# Patient Record
Sex: Female | Born: 1950
Health system: Southern US, Community
[De-identification: ages and names within clinical notes are randomized; demographics above are authoritative.]

## PROBLEM LIST (undated history)

## (undated) DIAGNOSIS — Z950 Presence of cardiac pacemaker: Secondary | ICD-10-CM

## (undated) DIAGNOSIS — R519 Headache, unspecified: Secondary | ICD-10-CM

## (undated) DIAGNOSIS — H409 Unspecified glaucoma: Secondary | ICD-10-CM

## (undated) DIAGNOSIS — I509 Heart failure, unspecified: Secondary | ICD-10-CM

## (undated) DIAGNOSIS — M1990 Unspecified osteoarthritis, unspecified site: Secondary | ICD-10-CM

## (undated) DIAGNOSIS — R51 Headache: Secondary | ICD-10-CM

## (undated) DIAGNOSIS — K259 Gastric ulcer, unspecified as acute or chronic, without hemorrhage or perforation: Secondary | ICD-10-CM

## (undated) DIAGNOSIS — I729 Aneurysm of unspecified site: Secondary | ICD-10-CM

## (undated) DIAGNOSIS — K5900 Constipation, unspecified: Secondary | ICD-10-CM

## (undated) DIAGNOSIS — Z992 Dependence on renal dialysis: Secondary | ICD-10-CM

## (undated) DIAGNOSIS — I2510 Atherosclerotic heart disease of native coronary artery without angina pectoris: Secondary | ICD-10-CM

## (undated) DIAGNOSIS — D649 Anemia, unspecified: Secondary | ICD-10-CM

## (undated) DIAGNOSIS — I10 Essential (primary) hypertension: Secondary | ICD-10-CM

## (undated) DIAGNOSIS — N189 Chronic kidney disease, unspecified: Secondary | ICD-10-CM

## (undated) HISTORY — PX: INSERT / REPLACE / REMOVE PACEMAKER: SUR710

## (undated) HISTORY — DX: Essential (primary) hypertension: I10

## (undated) HISTORY — DX: Aneurysm of unspecified site: I72.9

## (undated) HISTORY — DX: Gastric ulcer, unspecified as acute or chronic, without hemorrhage or perforation: K25.9

## (undated) HISTORY — PX: PACEMAKER PLACEMENT: SHX43

## (undated) HISTORY — DX: Anemia, unspecified: D64.9

## (undated) HISTORY — DX: Chronic kidney disease, unspecified: N18.9

## (undated) HISTORY — PX: ANEURYSM COILING: SHX5349

## (undated) HISTORY — PX: COLONOSCOPY: SHX174

## (undated) SURGERY — Surgical Case
Anesthesia: *Unknown

---

## 2005-12-29 ENCOUNTER — Emergency Department: Payer: Self-pay | Admitting: Emergency Medicine

## 2007-04-20 ENCOUNTER — Other Ambulatory Visit: Payer: Self-pay

## 2007-04-20 ENCOUNTER — Inpatient Hospital Stay: Payer: Self-pay | Admitting: Internal Medicine

## 2008-02-24 ENCOUNTER — Emergency Department: Payer: Self-pay | Admitting: Emergency Medicine

## 2008-03-20 ENCOUNTER — Emergency Department: Payer: Self-pay | Admitting: Internal Medicine

## 2008-05-08 ENCOUNTER — Ambulatory Visit: Payer: Self-pay | Admitting: Family Medicine

## 2008-05-28 ENCOUNTER — Ambulatory Visit: Payer: Self-pay

## 2008-08-21 ENCOUNTER — Ambulatory Visit: Payer: Self-pay | Admitting: Gastroenterology

## 2009-03-20 ENCOUNTER — Ambulatory Visit: Payer: Self-pay | Admitting: Internal Medicine

## 2009-03-25 ENCOUNTER — Inpatient Hospital Stay: Payer: Self-pay | Admitting: Internal Medicine

## 2009-07-24 ENCOUNTER — Ambulatory Visit: Payer: Self-pay | Admitting: Internal Medicine

## 2010-09-17 ENCOUNTER — Ambulatory Visit: Payer: Self-pay | Admitting: Internal Medicine

## 2011-06-02 ENCOUNTER — Ambulatory Visit: Payer: Self-pay | Admitting: Internal Medicine

## 2011-06-07 DIAGNOSIS — Q612 Polycystic kidney, adult type: Secondary | ICD-10-CM | POA: Insufficient documentation

## 2011-07-01 ENCOUNTER — Ambulatory Visit: Payer: Self-pay | Admitting: Oncology

## 2011-07-05 ENCOUNTER — Ambulatory Visit: Payer: Self-pay | Admitting: Oncology

## 2011-07-08 ENCOUNTER — Ambulatory Visit: Payer: Self-pay | Admitting: Oncology

## 2011-07-18 ENCOUNTER — Ambulatory Visit: Payer: Self-pay | Admitting: Emergency Medicine

## 2011-08-05 ENCOUNTER — Ambulatory Visit: Payer: Self-pay | Admitting: Oncology

## 2011-08-26 LAB — CBC CANCER CENTER
Bands: 1 %
Basophil %: 0.3 %
Basophil: 1 %
Eosinophil %: 1.5 %
Eosinophil: 4 %
HGB: 10.7 g/dL — ABNORMAL LOW (ref 12.0–16.0)
Lymphocyte #: 1.7 x10 3/mm (ref 1.0–3.6)
Lymphocyte %: 29.9 %
MCHC: 33.7 g/dL (ref 32.0–36.0)
MCV: 85 fL (ref 80–100)
Monocyte #: 0.4 x10 3/mm (ref 0.0–0.7)
Monocyte %: 7 %
Monocytes: 4 %
Neutrophil %: 61.3 %
RBC: 3.74 10*6/uL — ABNORMAL LOW (ref 3.80–5.20)
RDW: 20.3 % — ABNORMAL HIGH (ref 11.5–14.5)
WBC: 5.6 x10 3/mm (ref 3.6–11.0)

## 2011-08-26 LAB — IRON AND TIBC
Iron Saturation: 27 %
Unbound Iron-Bind.Cap.: 219 ug/dL

## 2011-08-26 LAB — FERRITIN: Ferritin (ARMC): 110 ng/mL (ref 8–388)

## 2011-09-02 ENCOUNTER — Ambulatory Visit: Payer: Self-pay | Admitting: Oncology

## 2011-09-21 DIAGNOSIS — R809 Proteinuria, unspecified: Secondary | ICD-10-CM | POA: Insufficient documentation

## 2011-11-25 ENCOUNTER — Ambulatory Visit: Payer: Self-pay | Admitting: Oncology

## 2011-11-25 LAB — CBC CANCER CENTER
Eosinophil #: 0.2 x10 3/mm (ref 0.0–0.7)
Eosinophil %: 2.4 %
Lymphocyte %: 33 %
MCV: 89 fL (ref 80–100)
Monocyte #: 0.6 x10 3/mm (ref 0.2–0.9)
Neutrophil %: 55.2 %
Platelet: 222 x10 3/mm (ref 150–440)
RBC: 3.51 10*6/uL — ABNORMAL LOW (ref 3.80–5.20)
RDW: 15.3 % — ABNORMAL HIGH (ref 11.5–14.5)

## 2011-11-25 LAB — IRON AND TIBC
Iron Bind.Cap.(Total): 316 ug/dL (ref 250–450)
Iron Saturation: 12 %
Unbound Iron-Bind.Cap.: 279 ug/dL

## 2011-11-25 LAB — FERRITIN: Ferritin (ARMC): 38 ng/mL (ref 8–388)

## 2011-12-03 ENCOUNTER — Ambulatory Visit: Payer: Self-pay | Admitting: Oncology

## 2011-12-28 ENCOUNTER — Ambulatory Visit: Payer: Self-pay | Admitting: Internal Medicine

## 2012-07-12 ENCOUNTER — Ambulatory Visit: Payer: Self-pay | Admitting: Internal Medicine

## 2012-07-13 ENCOUNTER — Ambulatory Visit: Payer: Self-pay | Admitting: Oncology

## 2012-07-13 LAB — CBC CANCER CENTER
Basophil #: 0.1 x10 3/mm (ref 0.0–0.1)
Basophil %: 1.1 %
HCT: 28.7 % — ABNORMAL LOW (ref 35.0–47.0)
HGB: 9.8 g/dL — ABNORMAL LOW (ref 12.0–16.0)
Lymphocyte %: 42.1 %
MCH: 28 pg (ref 26.0–34.0)
MCHC: 34.2 g/dL (ref 32.0–36.0)
MCV: 82 fL (ref 80–100)
Monocyte #: 0.5 x10 3/mm (ref 0.2–0.9)
Monocyte %: 8.6 %
Platelet: 201 x10 3/mm (ref 150–440)
RBC: 3.5 10*6/uL — ABNORMAL LOW (ref 3.80–5.20)
WBC: 6.1 x10 3/mm (ref 3.6–11.0)

## 2012-07-13 LAB — IRON AND TIBC
Iron Bind.Cap.(Total): 328 ug/dL (ref 250–450)
Iron Saturation: 15 %

## 2012-07-13 LAB — FERRITIN: Ferritin (ARMC): 24 ng/mL (ref 8–388)

## 2012-08-04 ENCOUNTER — Ambulatory Visit: Payer: Self-pay | Admitting: Oncology

## 2012-11-01 ENCOUNTER — Ambulatory Visit: Payer: Self-pay | Admitting: Oncology

## 2012-11-08 DIAGNOSIS — Z950 Presence of cardiac pacemaker: Secondary | ICD-10-CM | POA: Insufficient documentation

## 2012-11-08 DIAGNOSIS — G939 Disorder of brain, unspecified: Secondary | ICD-10-CM | POA: Insufficient documentation

## 2012-11-08 DIAGNOSIS — I726 Aneurysm of vertebral artery: Secondary | ICD-10-CM | POA: Insufficient documentation

## 2012-11-08 DIAGNOSIS — F17201 Nicotine dependence, unspecified, in remission: Secondary | ICD-10-CM | POA: Insufficient documentation

## 2012-11-08 DIAGNOSIS — I10 Essential (primary) hypertension: Secondary | ICD-10-CM | POA: Insufficient documentation

## 2012-11-08 DIAGNOSIS — Q619 Cystic kidney disease, unspecified: Secondary | ICD-10-CM | POA: Insufficient documentation

## 2012-11-13 LAB — CBC CANCER CENTER
Basophil #: 0 x10 3/mm (ref 0.0–0.1)
Eosinophil #: 0.1 x10 3/mm (ref 0.0–0.7)
HGB: 9.8 g/dL — ABNORMAL LOW (ref 12.0–16.0)
MCV: 84 fL (ref 80–100)
Neutrophil %: 55.4 %
Platelet: 194 x10 3/mm (ref 150–440)
WBC: 5.8 x10 3/mm (ref 3.6–11.0)

## 2012-11-13 LAB — BASIC METABOLIC PANEL
Anion Gap: 14 (ref 7–16)
BUN: 35 mg/dL — ABNORMAL HIGH (ref 7–18)
Calcium, Total: 8.8 mg/dL (ref 8.5–10.1)
Chloride: 108 mmol/L — ABNORMAL HIGH (ref 98–107)
Co2: 19 mmol/L — ABNORMAL LOW (ref 21–32)
EGFR (African American): 22 — ABNORMAL LOW
EGFR (Non-African Amer.): 19 — ABNORMAL LOW
Glucose: 87 mg/dL (ref 65–99)
Osmolality: 289 (ref 275–301)
Potassium: 4.9 mmol/L (ref 3.5–5.1)
Sodium: 141 mmol/L (ref 136–145)

## 2012-11-13 LAB — IRON AND TIBC
Iron: 87 ug/dL (ref 50–170)
Unbound Iron-Bind.Cap.: 234 ug/dL

## 2012-11-13 LAB — FERRITIN: Ferritin (ARMC): 36 ng/mL (ref 8–388)

## 2012-12-02 ENCOUNTER — Ambulatory Visit: Payer: Self-pay | Admitting: Oncology

## 2013-01-01 ENCOUNTER — Ambulatory Visit: Payer: Self-pay | Admitting: Oncology

## 2013-02-01 ENCOUNTER — Ambulatory Visit: Payer: Self-pay | Admitting: Oncology

## 2013-02-19 LAB — CBC CANCER CENTER
Basophil #: 0.1 x10 3/mm (ref 0.0–0.1)
Basophil %: 0.8 %
Eosinophil #: 0.2 x10 3/mm (ref 0.0–0.7)
HCT: 27.7 % — ABNORMAL LOW (ref 35.0–47.0)
HGB: 9.5 g/dL — ABNORMAL LOW (ref 12.0–16.0)
Lymphocyte %: 31.6 %
MCH: 27.8 pg (ref 26.0–34.0)
MCHC: 34.4 g/dL (ref 32.0–36.0)
MCV: 81 fL (ref 80–100)
Monocyte #: 0.4 x10 3/mm (ref 0.2–0.9)
Neutrophil #: 3.7 x10 3/mm (ref 1.4–6.5)
Neutrophil %: 58.2 %
Platelet: 256 x10 3/mm (ref 150–440)
RDW: 16.8 % — ABNORMAL HIGH (ref 11.5–14.5)

## 2013-02-19 LAB — BASIC METABOLIC PANEL
BUN: 33 mg/dL — ABNORMAL HIGH (ref 7–18)
Calcium, Total: 8.7 mg/dL (ref 8.5–10.1)
Co2: 19 mmol/L — ABNORMAL LOW (ref 21–32)
Creatinine: 2.69 mg/dL — ABNORMAL HIGH (ref 0.60–1.30)
Osmolality: 289 (ref 275–301)
Sodium: 141 mmol/L (ref 136–145)

## 2013-03-04 ENCOUNTER — Ambulatory Visit: Payer: Self-pay | Admitting: Oncology

## 2013-04-02 LAB — CANCER CENTER HEMOGLOBIN: HGB: 9.9 g/dL — ABNORMAL LOW (ref 12.0–16.0)

## 2013-04-03 ENCOUNTER — Ambulatory Visit: Payer: Self-pay | Admitting: Oncology

## 2013-05-14 ENCOUNTER — Ambulatory Visit: Payer: Self-pay | Admitting: Oncology

## 2013-05-14 LAB — BASIC METABOLIC PANEL
Calcium, Total: 8.6 mg/dL (ref 8.5–10.1)
Chloride: 111 mmol/L — ABNORMAL HIGH (ref 98–107)
Co2: 19 mmol/L — ABNORMAL LOW (ref 21–32)
EGFR (African American): 22 — ABNORMAL LOW
EGFR (Non-African Amer.): 19 — ABNORMAL LOW
Glucose: 97 mg/dL (ref 65–99)
Osmolality: 286 (ref 275–301)
Potassium: 4.6 mmol/L (ref 3.5–5.1)
Sodium: 141 mmol/L (ref 136–145)

## 2013-05-14 LAB — HEPATIC FUNCTION PANEL A (ARMC)
Alkaline Phosphatase: 44 U/L — ABNORMAL LOW (ref 50–136)
Bilirubin, Direct: <0.1 mg/dL (ref 0.00–0.20)
Bilirubin,Total: 0.3 mg/dL (ref 0.2–1.0)
SGOT(AST): 14 U/L — ABNORMAL LOW (ref 15–37)
SGPT (ALT): 13 U/L (ref 12–78)
Total Protein: 7.1 g/dL (ref 6.4–8.2)

## 2013-05-14 LAB — CBC CANCER CENTER
Basophil %: 1.2 %
Eosinophil %: 2.8 %
Lymphocyte %: 36.2 %
MCH: 26.7 pg (ref 26.0–34.0)
MCHC: 33.9 g/dL (ref 32.0–36.0)
MCV: 79 fL — ABNORMAL LOW (ref 80–100)
Monocyte #: 0.5 x10 3/mm (ref 0.2–0.9)
Neutrophil #: 3.3 x10 3/mm (ref 1.4–6.5)
Neutrophil %: 51.9 %
RBC: 3.58 10*6/uL — ABNORMAL LOW (ref 3.80–5.20)
RDW: 17 % — ABNORMAL HIGH (ref 11.5–14.5)

## 2013-06-03 ENCOUNTER — Ambulatory Visit: Payer: Self-pay | Admitting: Oncology

## 2013-07-04 ENCOUNTER — Ambulatory Visit: Payer: Self-pay | Admitting: Oncology

## 2013-08-14 ENCOUNTER — Ambulatory Visit: Payer: Self-pay | Admitting: Oncology

## 2013-08-15 LAB — CBC CANCER CENTER
Basophil #: 0.1 x10 3/mm (ref 0.0–0.1)
Basophil %: 1.1 %
EOS ABS: 0.2 x10 3/mm (ref 0.0–0.7)
Eosinophil %: 3 %
HCT: 23.9 % — ABNORMAL LOW (ref 35.0–47.0)
HGB: 7.9 g/dL — AB (ref 12.0–16.0)
LYMPHS PCT: 34.6 %
Lymphocyte #: 1.8 x10 3/mm (ref 1.0–3.6)
MCH: 25.6 pg — ABNORMAL LOW (ref 26.0–34.0)
MCHC: 32.9 g/dL (ref 32.0–36.0)
MCV: 78 fL — AB (ref 80–100)
Monocyte #: 0.3 x10 3/mm (ref 0.2–0.9)
Monocyte %: 6.8 %
Neutrophil #: 2.8 x10 3/mm (ref 1.4–6.5)
Neutrophil %: 54.5 %
PLATELETS: 219 x10 3/mm (ref 150–440)
RBC: 3.08 10*6/uL — ABNORMAL LOW (ref 3.80–5.20)
RDW: 20.6 % — ABNORMAL HIGH (ref 11.5–14.5)
WBC: 5.1 x10 3/mm (ref 3.6–11.0)

## 2013-08-15 LAB — BASIC METABOLIC PANEL
ANION GAP: 13 (ref 7–16)
BUN: 46 mg/dL — AB (ref 7–18)
CALCIUM: 8.4 mg/dL — AB (ref 8.5–10.1)
CREATININE: 3.18 mg/dL — AB (ref 0.60–1.30)
Chloride: 110 mmol/L — ABNORMAL HIGH (ref 98–107)
Co2: 19 mmol/L — ABNORMAL LOW (ref 21–32)
EGFR (African American): 17 — ABNORMAL LOW
EGFR (Non-African Amer.): 15 — ABNORMAL LOW
Glucose: 97 mg/dL (ref 65–99)
OSMOLALITY: 295 (ref 275–301)
POTASSIUM: 5.3 mmol/L — AB (ref 3.5–5.1)
Sodium: 142 mmol/L (ref 136–145)

## 2013-08-15 LAB — PHOSPHORUS: Phosphorus: 4.8 mg/dL (ref 2.5–4.9)

## 2013-08-15 LAB — IRON AND TIBC
IRON SATURATION: 13 %
IRON: 55 ug/dL (ref 50–170)
Iron Bind.Cap.(Total): 431 ug/dL (ref 250–450)
Unbound Iron-Bind.Cap.: 376 ug/dL

## 2013-08-15 LAB — FERRITIN: Ferritin (ARMC): 13 ng/mL (ref 8–388)

## 2013-09-01 ENCOUNTER — Ambulatory Visit: Payer: Self-pay | Admitting: Oncology

## 2013-09-12 LAB — CANCER CENTER HEMOGLOBIN: HGB: 8.7 g/dL — AB (ref 12.0–16.0)

## 2013-10-02 ENCOUNTER — Ambulatory Visit: Payer: Self-pay | Admitting: Oncology

## 2013-10-10 ENCOUNTER — Ambulatory Visit: Payer: Self-pay | Admitting: Internal Medicine

## 2013-10-10 LAB — CANCER CENTER HEMOGLOBIN: HGB: 8.3 g/dL — AB (ref 12.0–16.0)

## 2013-10-24 ENCOUNTER — Ambulatory Visit: Payer: Self-pay | Admitting: Internal Medicine

## 2013-11-01 ENCOUNTER — Ambulatory Visit: Payer: Self-pay | Admitting: Oncology

## 2013-11-07 LAB — CBC CANCER CENTER
Basophil #: 0 x10 3/mm (ref 0.0–0.1)
Basophil %: 0.7 %
EOS PCT: 2.2 %
Eosinophil #: 0.1 x10 3/mm (ref 0.0–0.7)
HCT: 23.5 % — AB (ref 35.0–47.0)
HGB: 7.7 g/dL — ABNORMAL LOW (ref 12.0–16.0)
LYMPHS PCT: 31.8 %
Lymphocyte #: 1.9 x10 3/mm (ref 1.0–3.6)
MCH: 25.2 pg — ABNORMAL LOW (ref 26.0–34.0)
MCHC: 32.9 g/dL (ref 32.0–36.0)
MCV: 77 fL — AB (ref 80–100)
Monocyte #: 0.6 x10 3/mm (ref 0.2–0.9)
Monocyte %: 9.4 %
NEUTROS PCT: 55.9 %
Neutrophil #: 3.4 x10 3/mm (ref 1.4–6.5)
PLATELETS: 267 x10 3/mm (ref 150–440)
RBC: 3.07 10*6/uL — ABNORMAL LOW (ref 3.80–5.20)
RDW: 19.4 % — AB (ref 11.5–14.5)
WBC: 6 x10 3/mm (ref 3.6–11.0)

## 2013-12-02 ENCOUNTER — Ambulatory Visit: Payer: Self-pay | Admitting: Oncology

## 2013-12-05 ENCOUNTER — Ambulatory Visit: Payer: Self-pay | Admitting: Nephrology

## 2013-12-05 LAB — BASIC METABOLIC PANEL
Anion Gap: 5 — ABNORMAL LOW (ref 7–16)
BUN: 41 mg/dL — AB (ref 7–18)
CALCIUM: 8.6 mg/dL (ref 8.5–10.1)
Chloride: 115 mmol/L — ABNORMAL HIGH (ref 98–107)
Co2: 22 mmol/L (ref 21–32)
Creatinine: 3.14 mg/dL — ABNORMAL HIGH (ref 0.60–1.30)
EGFR (African American): 17 — ABNORMAL LOW
EGFR (Non-African Amer.): 15 — ABNORMAL LOW
GLUCOSE: 87 mg/dL (ref 65–99)
Osmolality: 293 (ref 275–301)
POTASSIUM: 5.6 mmol/L — AB (ref 3.5–5.1)
SODIUM: 142 mmol/L (ref 136–145)

## 2013-12-05 LAB — CANCER CENTER HEMOGLOBIN: HGB: 9.3 g/dL — ABNORMAL LOW (ref 12.0–16.0)

## 2014-01-01 ENCOUNTER — Ambulatory Visit: Payer: Self-pay | Admitting: Oncology

## 2014-01-02 LAB — CANCER CENTER HEMOGLOBIN: HGB: 11.6 g/dL — AB (ref 12.0–16.0)

## 2014-01-30 ENCOUNTER — Ambulatory Visit: Payer: Self-pay | Admitting: Nephrology

## 2014-01-30 LAB — CBC CANCER CENTER
Basophil #: 0.1 x10 3/mm (ref 0.0–0.1)
Basophil %: 1.3 %
EOS ABS: 0.1 x10 3/mm (ref 0.0–0.7)
Eosinophil %: 2.1 %
HCT: 31.5 % — ABNORMAL LOW (ref 35.0–47.0)
HGB: 10.4 g/dL — ABNORMAL LOW (ref 12.0–16.0)
Lymphocyte #: 2.4 x10 3/mm (ref 1.0–3.6)
Lymphocyte %: 39 %
MCH: 28.1 pg (ref 26.0–34.0)
MCHC: 33 g/dL (ref 32.0–36.0)
MCV: 85 fL (ref 80–100)
MONOS PCT: 8.8 %
Monocyte #: 0.5 x10 3/mm (ref 0.2–0.9)
NEUTROS ABS: 2.9 x10 3/mm (ref 1.4–6.5)
Neutrophil %: 48.8 %
PLATELETS: 231 x10 3/mm (ref 150–440)
RBC: 3.7 10*6/uL — AB (ref 3.80–5.20)
RDW: 18 % — ABNORMAL HIGH (ref 11.5–14.5)
WBC: 6 x10 3/mm (ref 3.6–11.0)

## 2014-01-30 LAB — PHOSPHORUS: Phosphorus: 4.1 mg/dL (ref 2.5–4.9)

## 2014-01-30 LAB — BASIC METABOLIC PANEL
Anion Gap: 8 (ref 7–16)
BUN: 42 mg/dL — ABNORMAL HIGH (ref 7–18)
CALCIUM: 8.8 mg/dL (ref 8.5–10.1)
Chloride: 116 mmol/L — ABNORMAL HIGH (ref 98–107)
Co2: 17 mmol/L — ABNORMAL LOW (ref 21–32)
Creatinine: 3.36 mg/dL — ABNORMAL HIGH (ref 0.60–1.30)
EGFR (African American): 16 — ABNORMAL LOW
EGFR (Non-African Amer.): 14 — ABNORMAL LOW
Glucose: 93 mg/dL (ref 65–99)
Osmolality: 291 (ref 275–301)
Potassium: 5 mmol/L (ref 3.5–5.1)
Sodium: 141 mmol/L (ref 136–145)

## 2014-01-30 LAB — ALBUMIN: ALBUMIN: 3.9 g/dL (ref 3.4–5.0)

## 2014-01-30 LAB — PROTEIN / CREATININE RATIO, URINE
Creatinine, Urine: 182.1 mg/dL — ABNORMAL HIGH (ref 30.0–125.0)
PROTEIN, RANDOM URINE: 99 mg/dL — AB (ref 0–12)
Protein/Creat. Ratio: 544 mg/gCREAT — ABNORMAL HIGH (ref 0–200)

## 2014-02-01 ENCOUNTER — Ambulatory Visit: Payer: Self-pay | Admitting: Oncology

## 2014-02-27 LAB — CANCER CENTER HEMOGLOBIN: HGB: 9 g/dL — ABNORMAL LOW (ref 12.0–16.0)

## 2014-03-04 ENCOUNTER — Ambulatory Visit: Payer: Self-pay | Admitting: Oncology

## 2014-03-27 LAB — CANCER CENTER HEMOGLOBIN: HGB: 9.1 g/dL — AB (ref 12.0–16.0)

## 2014-04-03 ENCOUNTER — Ambulatory Visit: Payer: Self-pay | Admitting: Oncology

## 2014-04-24 LAB — CANCER CENTER HEMOGLOBIN: HGB: 8.8 g/dL — AB (ref 12.0–16.0)

## 2014-05-04 ENCOUNTER — Ambulatory Visit: Payer: Self-pay | Admitting: Oncology

## 2014-05-22 LAB — IRON AND TIBC
IRON SATURATION: 17 %
IRON: 71 ug/dL (ref 50–170)
Iron Bind.Cap.(Total): 413 ug/dL (ref 250–450)
Unbound Iron-Bind.Cap.: 342 ug/dL

## 2014-05-22 LAB — CBC CANCER CENTER
BASOS PCT: 0.9 %
Basophil #: 0.1 x10 3/mm (ref 0.0–0.1)
EOS ABS: 0.1 x10 3/mm (ref 0.0–0.7)
EOS PCT: 2.1 %
HCT: 28.3 % — ABNORMAL LOW (ref 35.0–47.0)
HGB: 9.2 g/dL — ABNORMAL LOW (ref 12.0–16.0)
LYMPHS PCT: 29.7 %
Lymphocyte #: 1.8 x10 3/mm (ref 1.0–3.6)
MCH: 26.6 pg (ref 26.0–34.0)
MCHC: 32.3 g/dL (ref 32.0–36.0)
MCV: 82 fL (ref 80–100)
MONO ABS: 0.4 x10 3/mm (ref 0.2–0.9)
Monocyte %: 6.3 %
NEUTROS ABS: 3.7 x10 3/mm (ref 1.4–6.5)
NEUTROS PCT: 61 %
Platelet: 236 x10 3/mm (ref 150–440)
RBC: 3.44 10*6/uL — ABNORMAL LOW (ref 3.80–5.20)
RDW: 17.4 % — AB (ref 11.5–14.5)
WBC: 6.1 x10 3/mm (ref 3.6–11.0)

## 2014-05-22 LAB — FERRITIN: FERRITIN (ARMC): 17 ng/mL (ref 8–388)

## 2014-06-03 ENCOUNTER — Ambulatory Visit: Payer: Self-pay | Admitting: Oncology

## 2014-06-19 LAB — CREATININE, SERUM
Creatinine: 3.33 mg/dL — ABNORMAL HIGH (ref 0.60–1.30)
EGFR (Non-African Amer.): 15 — ABNORMAL LOW
GFR CALC AF AMER: 18 — AB

## 2014-06-19 LAB — BUN: BUN: 50 mg/dL — AB (ref 7–18)

## 2014-06-19 LAB — CANCER CENTER HEMOGLOBIN: HGB: 8.4 g/dL — ABNORMAL LOW (ref 12.0–16.0)

## 2014-07-04 ENCOUNTER — Ambulatory Visit: Payer: Self-pay | Admitting: Oncology

## 2014-07-17 DIAGNOSIS — D631 Anemia in chronic kidney disease: Secondary | ICD-10-CM | POA: Diagnosis not present

## 2014-07-17 DIAGNOSIS — I129 Hypertensive chronic kidney disease with stage 1 through stage 4 chronic kidney disease, or unspecified chronic kidney disease: Secondary | ICD-10-CM | POA: Diagnosis not present

## 2014-07-17 DIAGNOSIS — D509 Iron deficiency anemia, unspecified: Secondary | ICD-10-CM | POA: Diagnosis not present

## 2014-07-17 DIAGNOSIS — N189 Chronic kidney disease, unspecified: Secondary | ICD-10-CM | POA: Diagnosis not present

## 2014-07-17 LAB — CANCER CENTER HEMOGLOBIN: HGB: 9.4 g/dL — ABNORMAL LOW (ref 12.0–16.0)

## 2014-08-04 ENCOUNTER — Ambulatory Visit: Payer: Self-pay | Admitting: Oncology

## 2014-08-08 DIAGNOSIS — Z950 Presence of cardiac pacemaker: Secondary | ICD-10-CM | POA: Diagnosis not present

## 2014-08-08 DIAGNOSIS — E215 Disorder of parathyroid gland, unspecified: Secondary | ICD-10-CM | POA: Diagnosis not present

## 2014-08-14 DIAGNOSIS — D509 Iron deficiency anemia, unspecified: Secondary | ICD-10-CM | POA: Diagnosis not present

## 2014-08-14 DIAGNOSIS — N189 Chronic kidney disease, unspecified: Secondary | ICD-10-CM | POA: Diagnosis not present

## 2014-08-14 DIAGNOSIS — I129 Hypertensive chronic kidney disease with stage 1 through stage 4 chronic kidney disease, or unspecified chronic kidney disease: Secondary | ICD-10-CM | POA: Diagnosis not present

## 2014-08-14 DIAGNOSIS — D631 Anemia in chronic kidney disease: Secondary | ICD-10-CM | POA: Diagnosis not present

## 2014-09-02 ENCOUNTER — Ambulatory Visit
Admit: 2014-09-02 | Disposition: A | Discharge status: Home / Self Care | Payer: Self-pay | Attending: Oncology | Admitting: Oncology

## 2014-09-11 DIAGNOSIS — D631 Anemia in chronic kidney disease: Secondary | ICD-10-CM | POA: Diagnosis not present

## 2014-09-11 DIAGNOSIS — Z8679 Personal history of other diseases of the circulatory system: Secondary | ICD-10-CM | POA: Diagnosis not present

## 2014-09-11 DIAGNOSIS — Z950 Presence of cardiac pacemaker: Secondary | ICD-10-CM | POA: Diagnosis not present

## 2014-09-11 DIAGNOSIS — Z7982 Long term (current) use of aspirin: Secondary | ICD-10-CM | POA: Diagnosis not present

## 2014-09-11 DIAGNOSIS — Z79899 Other long term (current) drug therapy: Secondary | ICD-10-CM | POA: Diagnosis not present

## 2014-09-11 DIAGNOSIS — D509 Iron deficiency anemia, unspecified: Secondary | ICD-10-CM | POA: Diagnosis not present

## 2014-09-11 DIAGNOSIS — I129 Hypertensive chronic kidney disease with stage 1 through stage 4 chronic kidney disease, or unspecified chronic kidney disease: Secondary | ICD-10-CM | POA: Diagnosis not present

## 2014-09-11 DIAGNOSIS — N189 Chronic kidney disease, unspecified: Secondary | ICD-10-CM | POA: Diagnosis not present

## 2014-09-18 DIAGNOSIS — Z79899 Other long term (current) drug therapy: Secondary | ICD-10-CM | POA: Diagnosis not present

## 2014-09-18 DIAGNOSIS — D509 Iron deficiency anemia, unspecified: Secondary | ICD-10-CM | POA: Diagnosis not present

## 2014-09-18 DIAGNOSIS — I129 Hypertensive chronic kidney disease with stage 1 through stage 4 chronic kidney disease, or unspecified chronic kidney disease: Secondary | ICD-10-CM | POA: Diagnosis not present

## 2014-09-18 DIAGNOSIS — Z7982 Long term (current) use of aspirin: Secondary | ICD-10-CM | POA: Diagnosis not present

## 2014-09-18 DIAGNOSIS — D631 Anemia in chronic kidney disease: Secondary | ICD-10-CM | POA: Diagnosis not present

## 2014-09-18 DIAGNOSIS — Z950 Presence of cardiac pacemaker: Secondary | ICD-10-CM | POA: Diagnosis not present

## 2014-09-18 DIAGNOSIS — Z8679 Personal history of other diseases of the circulatory system: Secondary | ICD-10-CM | POA: Diagnosis not present

## 2014-09-18 DIAGNOSIS — N189 Chronic kidney disease, unspecified: Secondary | ICD-10-CM | POA: Diagnosis not present

## 2014-09-25 DIAGNOSIS — I129 Hypertensive chronic kidney disease with stage 1 through stage 4 chronic kidney disease, or unspecified chronic kidney disease: Secondary | ICD-10-CM | POA: Diagnosis not present

## 2014-09-25 DIAGNOSIS — N189 Chronic kidney disease, unspecified: Secondary | ICD-10-CM | POA: Diagnosis not present

## 2014-09-25 DIAGNOSIS — Z79899 Other long term (current) drug therapy: Secondary | ICD-10-CM | POA: Diagnosis not present

## 2014-09-25 DIAGNOSIS — Z8679 Personal history of other diseases of the circulatory system: Secondary | ICD-10-CM | POA: Diagnosis not present

## 2014-09-25 DIAGNOSIS — Z950 Presence of cardiac pacemaker: Secondary | ICD-10-CM | POA: Diagnosis not present

## 2014-09-25 DIAGNOSIS — D509 Iron deficiency anemia, unspecified: Secondary | ICD-10-CM | POA: Diagnosis not present

## 2014-09-25 DIAGNOSIS — D631 Anemia in chronic kidney disease: Secondary | ICD-10-CM | POA: Diagnosis not present

## 2014-09-25 DIAGNOSIS — Z7982 Long term (current) use of aspirin: Secondary | ICD-10-CM | POA: Diagnosis not present

## 2014-10-03 ENCOUNTER — Ambulatory Visit
Admit: 2014-10-03 | Disposition: A | Discharge status: Home / Self Care | Payer: Self-pay | Attending: Oncology | Admitting: Oncology

## 2014-10-09 DIAGNOSIS — N189 Chronic kidney disease, unspecified: Secondary | ICD-10-CM | POA: Diagnosis not present

## 2014-10-09 DIAGNOSIS — I129 Hypertensive chronic kidney disease with stage 1 through stage 4 chronic kidney disease, or unspecified chronic kidney disease: Secondary | ICD-10-CM | POA: Diagnosis not present

## 2014-10-09 DIAGNOSIS — D631 Anemia in chronic kidney disease: Secondary | ICD-10-CM | POA: Diagnosis not present

## 2014-10-09 DIAGNOSIS — D509 Iron deficiency anemia, unspecified: Secondary | ICD-10-CM | POA: Diagnosis not present

## 2014-10-09 LAB — CANCER CENTER HEMOGLOBIN: HGB: 8.8 g/dL — ABNORMAL LOW (ref 12.0–16.0)

## 2014-10-27 ENCOUNTER — Ambulatory Visit
Admit: 2014-10-27 | Disposition: A | Discharge status: Home / Self Care | Payer: Self-pay | Attending: Internal Medicine | Admitting: Internal Medicine

## 2014-10-27 DIAGNOSIS — Z1231 Encounter for screening mammogram for malignant neoplasm of breast: Secondary | ICD-10-CM | POA: Diagnosis not present

## 2014-11-01 ENCOUNTER — Other Ambulatory Visit: Payer: Self-pay | Admitting: Oncology

## 2014-11-01 DIAGNOSIS — N189 Chronic kidney disease, unspecified: Principal | ICD-10-CM

## 2014-11-01 DIAGNOSIS — D631 Anemia in chronic kidney disease: Secondary | ICD-10-CM

## 2014-11-01 DIAGNOSIS — D509 Iron deficiency anemia, unspecified: Secondary | ICD-10-CM | POA: Insufficient documentation

## 2014-11-06 ENCOUNTER — Inpatient Hospital Stay: Payer: Medicare Other | Attending: Oncology

## 2014-11-06 ENCOUNTER — Inpatient Hospital Stay: Payer: Medicare Other

## 2014-11-06 VITALS — BP 143/83 | HR 73 | Temp 96.7°F | Resp 20

## 2014-11-06 DIAGNOSIS — K739 Chronic hepatitis, unspecified: Secondary | ICD-10-CM | POA: Diagnosis not present

## 2014-11-06 DIAGNOSIS — D509 Iron deficiency anemia, unspecified: Secondary | ICD-10-CM | POA: Diagnosis not present

## 2014-11-06 DIAGNOSIS — Z79899 Other long term (current) drug therapy: Secondary | ICD-10-CM | POA: Diagnosis not present

## 2014-11-06 DIAGNOSIS — D638 Anemia in other chronic diseases classified elsewhere: Secondary | ICD-10-CM | POA: Diagnosis not present

## 2014-11-06 DIAGNOSIS — D631 Anemia in chronic kidney disease: Secondary | ICD-10-CM

## 2014-11-06 DIAGNOSIS — Z950 Presence of cardiac pacemaker: Secondary | ICD-10-CM | POA: Diagnosis not present

## 2014-11-06 DIAGNOSIS — I129 Hypertensive chronic kidney disease with stage 1 through stage 4 chronic kidney disease, or unspecified chronic kidney disease: Secondary | ICD-10-CM | POA: Insufficient documentation

## 2014-11-06 DIAGNOSIS — R162 Hepatomegaly with splenomegaly, not elsewhere classified: Secondary | ICD-10-CM | POA: Diagnosis not present

## 2014-11-06 DIAGNOSIS — N189 Chronic kidney disease, unspecified: Principal | ICD-10-CM

## 2014-11-06 DIAGNOSIS — N182 Chronic kidney disease, stage 2 (mild): Secondary | ICD-10-CM | POA: Diagnosis not present

## 2014-11-06 LAB — HEMOGLOBIN: Hemoglobin: 9 g/dL — ABNORMAL LOW (ref 12.0–16.0)

## 2014-11-06 MED ORDER — EPOETIN ALFA 40000 UNIT/ML IJ SOLN
40000.0000 [IU] | Freq: Once | INTRAMUSCULAR | Status: AC
  Administered 2014-11-06: 40000 [IU] via SUBCUTANEOUS
  Filled 2014-11-06: qty 1

## 2014-12-04 ENCOUNTER — Encounter (INDEPENDENT_AMBULATORY_CARE_PROVIDER_SITE_OTHER): Payer: Self-pay

## 2014-12-04 ENCOUNTER — Inpatient Hospital Stay: Payer: Medicare Other | Attending: Oncology

## 2014-12-04 ENCOUNTER — Inpatient Hospital Stay: Payer: Medicare Other

## 2014-12-04 DIAGNOSIS — Z950 Presence of cardiac pacemaker: Secondary | ICD-10-CM | POA: Insufficient documentation

## 2014-12-04 DIAGNOSIS — D509 Iron deficiency anemia, unspecified: Secondary | ICD-10-CM | POA: Diagnosis not present

## 2014-12-04 DIAGNOSIS — Z79899 Other long term (current) drug therapy: Secondary | ICD-10-CM | POA: Diagnosis not present

## 2014-12-04 DIAGNOSIS — Z7982 Long term (current) use of aspirin: Secondary | ICD-10-CM | POA: Diagnosis not present

## 2014-12-04 DIAGNOSIS — I129 Hypertensive chronic kidney disease with stage 1 through stage 4 chronic kidney disease, or unspecified chronic kidney disease: Secondary | ICD-10-CM | POA: Insufficient documentation

## 2014-12-04 DIAGNOSIS — D631 Anemia in chronic kidney disease: Secondary | ICD-10-CM

## 2014-12-04 DIAGNOSIS — N189 Chronic kidney disease, unspecified: Secondary | ICD-10-CM | POA: Insufficient documentation

## 2014-12-04 DIAGNOSIS — Z8679 Personal history of other diseases of the circulatory system: Secondary | ICD-10-CM | POA: Insufficient documentation

## 2014-12-04 DIAGNOSIS — Z87891 Personal history of nicotine dependence: Secondary | ICD-10-CM | POA: Diagnosis not present

## 2014-12-04 LAB — HEMOGLOBIN: Hemoglobin: 11.1 g/dL — ABNORMAL LOW (ref 12.0–16.0)

## 2015-01-01 ENCOUNTER — Inpatient Hospital Stay: Payer: Medicare Other

## 2015-01-01 ENCOUNTER — Inpatient Hospital Stay (HOSPITAL_BASED_OUTPATIENT_CLINIC_OR_DEPARTMENT_OTHER): Payer: Medicare Other | Admitting: Oncology

## 2015-01-01 DIAGNOSIS — Z8679 Personal history of other diseases of the circulatory system: Secondary | ICD-10-CM | POA: Diagnosis not present

## 2015-01-01 DIAGNOSIS — D631 Anemia in chronic kidney disease: Secondary | ICD-10-CM | POA: Diagnosis not present

## 2015-01-01 DIAGNOSIS — I129 Hypertensive chronic kidney disease with stage 1 through stage 4 chronic kidney disease, or unspecified chronic kidney disease: Secondary | ICD-10-CM

## 2015-01-01 DIAGNOSIS — Z7982 Long term (current) use of aspirin: Secondary | ICD-10-CM | POA: Diagnosis not present

## 2015-01-01 DIAGNOSIS — Z87891 Personal history of nicotine dependence: Secondary | ICD-10-CM

## 2015-01-01 DIAGNOSIS — E875 Hyperkalemia: Secondary | ICD-10-CM | POA: Insufficient documentation

## 2015-01-01 DIAGNOSIS — Z79899 Other long term (current) drug therapy: Secondary | ICD-10-CM | POA: Diagnosis not present

## 2015-01-01 DIAGNOSIS — Z950 Presence of cardiac pacemaker: Secondary | ICD-10-CM | POA: Diagnosis not present

## 2015-01-01 DIAGNOSIS — E872 Acidosis, unspecified: Secondary | ICD-10-CM | POA: Insufficient documentation

## 2015-01-01 DIAGNOSIS — N189 Chronic kidney disease, unspecified: Secondary | ICD-10-CM

## 2015-01-01 DIAGNOSIS — N186 End stage renal disease: Secondary | ICD-10-CM | POA: Insufficient documentation

## 2015-01-01 DIAGNOSIS — D509 Iron deficiency anemia, unspecified: Secondary | ICD-10-CM

## 2015-01-01 LAB — HEMOGLOBIN: Hemoglobin: 9.8 g/dL — ABNORMAL LOW (ref 12.0–16.0)

## 2015-01-12 NOTE — Progress Notes (Signed)
[facility]  Telephone:(336) [phone] Fax:(336) [phone]  ID: Melissa Bradshaw OB: Oct 25, 1950  MR#: [id number]  AOZ#:[id number]  Patient Care Team: Corky Downs, MD as PCP - General (Internal Medicine)  CHIEF COMPLAINT: No chief complaint on file.   INTERVAL HISTORY: Patient returns to clinic today for repeat laboratory work and further evaluation.  She continues to feel well and is asymptomatic. She has no neurologic complaints.  She denies any recent fevers or illnesses.  She has a good appetite and has maintained her weight.  She has no chest pain or shortness of breath.  She denies any nausea, vomiting, constipation, or diarrhea.  She has no melena or hematochezia.  She has no urinary complaints.  Patient offers no specific complaints today.   REVIEW OF SYSTEMS:   Review of Systems  Constitutional: Negative.   Respiratory: Negative.   Cardiovascular: Negative.   Gastrointestinal: Negative.     As per HPI. Otherwise, a complete review of systems is negatve.  PAST MEDICAL HISTORY: Past Medical History  Diagnosis Date  . Chronic kidney disease   . Anemia   . Hypertension   . Aneurysm   . Gastric ulcer     PAST SURGICAL HISTORY: Past Surgical History  Procedure Laterality Date  . Aneurysm coiling    . Colonoscopy      2013  . Pacemaker placement      FAMILY HISTORY Family History  Problem Relation Age of Onset  . Hypertension Mother   . Diabetes Father   . Cancer Sister   . Stroke Sister   . Hypertension Sister        ADVANCED DIRECTIVES:    HEALTH MAINTENANCE: History  Substance Use Topics  . Smoking status: Former Smoker -- 0.25 packs/day for 41 years    Types: Cigarettes  . Smokeless tobacco: Never Used  . Alcohol Use: No     Colonoscopy:  PAP:  Bone density:  Lipid panel:  Allergies  Allergen Reactions  . Iodine Other (See Comments)    Unknown reaction  . Ivp Dye [Iodinated Diagnostic Agents] Rash    Current  Outpatient Prescriptions  Medication Sig Dispense Refill  . amLODipine (NORVASC) 10 MG tablet Take 10 mg by mouth daily.    Marland Kitchen aspirin 81 MG tablet Take 81 mg by mouth daily.    Marland Kitchen labetalol (NORMODYNE) 200 MG tablet Take 200 mg by mouth 2 (two) times daily.     No current facility-administered medications for this visit.    OBJECTIVE: There were no vitals filed for this visit.   There is no weight on file to calculate BMI.    ECOG FS:0 - Asymptomatic  General: Well-developed, well-nourished, no acute distress. Eyes: anicteric sclera. Lungs: Clear to auscultation bilaterally. Heart: Regular rate and rhythm. No rubs, murmurs, or gallops. Abdomen: Soft, nontender, nondistended. No organomegaly noted, normoactive bowel sounds. Musculoskeletal: No edema, cyanosis, or clubbing. Neuro: Alert, answering all questions appropriately. Cranial nerves grossly intact. Skin: No rashes or petechiae noted. Psych: Normal affect.   LAB RESULTS:  Lab Results  Component Value Date   NA 141 01/30/2014   K 5.0 01/30/2014   CL 116* 01/30/2014   CO2 17* 01/30/2014   GLUCOSE 93 01/30/2014   BUN 50* 06/19/2014   CREATININE 3.33* 06/19/2014   CALCIUM 8.8 01/30/2014   PROT 7.1 05/14/2013   ALBUMIN 3.9 01/30/2014   AST 14* 05/14/2013   ALT 13 05/14/2013   ALKPHOS 44* 05/14/2013   GFRNONAA 14* 01/30/2014   GFRAA  16* 01/30/2014    Lab Results  Component Value Date   WBC 6.1 05/22/2014   NEUTROABS 3.7 05/22/2014   HGB 9.8* 01/01/2015   HCT 28.3* 05/22/2014   MCV 82 05/22/2014   PLT 236 05/22/2014     STUDIES: No results found.  ASSESSMENT: Anemia secondary to iron deficiency and chronic renal insufficiency.  PLAN:    1.  Anemia: Patient last received IV iron in April 2016. Although patient's hemoglobin is less than 10.0, she does not wish to have 40,000 units subcutaneous Procrit today. Return to clinic every 4 weeks for laboratory work and Procrit if her hemoglobin is below 10.0.   Patient will then return to clinic in 4 months for further evaluation, will repeat iron stores at that time.  Patient expressed understanding and was in agreement with this plan. She also understands that She can call clinic at any time with any questions, concerns, or complaints.    Jeralyn Ruthsimothy J Larron Armor, MD   01/12/2015 4:03 PM

## 2015-01-29 ENCOUNTER — Inpatient Hospital Stay: Payer: Medicare Other | Attending: Oncology

## 2015-01-29 ENCOUNTER — Inpatient Hospital Stay: Payer: Medicare Other

## 2015-01-29 VITALS — BP 134/80 | HR 90 | Temp 97.6°F | Resp 18

## 2015-01-29 DIAGNOSIS — N189 Chronic kidney disease, unspecified: Secondary | ICD-10-CM | POA: Insufficient documentation

## 2015-01-29 DIAGNOSIS — D631 Anemia in chronic kidney disease: Secondary | ICD-10-CM | POA: Diagnosis not present

## 2015-01-29 DIAGNOSIS — Z79899 Other long term (current) drug therapy: Secondary | ICD-10-CM | POA: Insufficient documentation

## 2015-01-29 LAB — HEMOGLOBIN: HEMOGLOBIN: 8.2 g/dL — AB (ref 12.0–16.0)

## 2015-01-29 MED ORDER — EPOETIN ALFA 40000 UNIT/ML IJ SOLN
40000.0000 [IU] | Freq: Once | INTRAMUSCULAR | Status: AC
  Administered 2015-01-29: 40000 [IU] via SUBCUTANEOUS
  Filled 2015-01-29: qty 1

## 2015-02-05 DIAGNOSIS — R162 Hepatomegaly with splenomegaly, not elsewhere classified: Secondary | ICD-10-CM | POA: Diagnosis not present

## 2015-02-05 DIAGNOSIS — K739 Chronic hepatitis, unspecified: Secondary | ICD-10-CM | POA: Diagnosis not present

## 2015-02-05 DIAGNOSIS — N182 Chronic kidney disease, stage 2 (mild): Secondary | ICD-10-CM | POA: Diagnosis not present

## 2015-02-05 DIAGNOSIS — Z950 Presence of cardiac pacemaker: Secondary | ICD-10-CM | POA: Diagnosis not present

## 2015-02-25 ENCOUNTER — Other Ambulatory Visit: Payer: Self-pay | Admitting: *Deleted

## 2015-02-25 DIAGNOSIS — N189 Chronic kidney disease, unspecified: Principal | ICD-10-CM

## 2015-02-25 DIAGNOSIS — D631 Anemia in chronic kidney disease: Secondary | ICD-10-CM

## 2015-02-26 ENCOUNTER — Inpatient Hospital Stay: Payer: Medicare Other | Attending: Oncology

## 2015-02-26 ENCOUNTER — Inpatient Hospital Stay: Payer: Medicare Other

## 2015-02-26 VITALS — BP 157/82 | HR 92 | Temp 97.0°F | Resp 18

## 2015-02-26 DIAGNOSIS — D631 Anemia in chronic kidney disease: Secondary | ICD-10-CM | POA: Diagnosis not present

## 2015-02-26 DIAGNOSIS — N189 Chronic kidney disease, unspecified: Secondary | ICD-10-CM | POA: Diagnosis not present

## 2015-02-26 DIAGNOSIS — D509 Iron deficiency anemia, unspecified: Secondary | ICD-10-CM | POA: Diagnosis not present

## 2015-02-26 DIAGNOSIS — I129 Hypertensive chronic kidney disease with stage 1 through stage 4 chronic kidney disease, or unspecified chronic kidney disease: Secondary | ICD-10-CM | POA: Insufficient documentation

## 2015-02-26 LAB — HEMOGLOBIN: Hemoglobin: 9.7 g/dL — ABNORMAL LOW (ref 12.0–16.0)

## 2015-02-26 MED ORDER — EPOETIN ALFA 40000 UNIT/ML IJ SOLN
40000.0000 [IU] | Freq: Once | INTRAMUSCULAR | Status: AC
  Administered 2015-02-26: 40000 [IU] via SUBCUTANEOUS
  Filled 2015-02-26: qty 1

## 2015-03-25 ENCOUNTER — Other Ambulatory Visit: Payer: Self-pay | Admitting: *Deleted

## 2015-03-25 DIAGNOSIS — N189 Chronic kidney disease, unspecified: Principal | ICD-10-CM

## 2015-03-25 DIAGNOSIS — D631 Anemia in chronic kidney disease: Secondary | ICD-10-CM

## 2015-03-26 ENCOUNTER — Inpatient Hospital Stay: Payer: Medicare Other | Attending: Oncology

## 2015-03-26 ENCOUNTER — Inpatient Hospital Stay: Payer: Medicare Other

## 2015-03-26 VITALS — BP 145/75 | HR 88 | Resp 20

## 2015-03-26 DIAGNOSIS — D509 Iron deficiency anemia, unspecified: Secondary | ICD-10-CM | POA: Diagnosis not present

## 2015-03-26 DIAGNOSIS — D631 Anemia in chronic kidney disease: Secondary | ICD-10-CM

## 2015-03-26 DIAGNOSIS — I129 Hypertensive chronic kidney disease with stage 1 through stage 4 chronic kidney disease, or unspecified chronic kidney disease: Secondary | ICD-10-CM | POA: Insufficient documentation

## 2015-03-26 DIAGNOSIS — N189 Chronic kidney disease, unspecified: Principal | ICD-10-CM

## 2015-03-26 LAB — HEMOGLOBIN: Hemoglobin: 9.1 g/dL — ABNORMAL LOW (ref 12.0–16.0)

## 2015-03-26 MED ORDER — EPOETIN ALFA 40000 UNIT/ML IJ SOLN
40000.0000 [IU] | Freq: Once | INTRAMUSCULAR | Status: AC
  Administered 2015-03-26: 40000 [IU] via SUBCUTANEOUS
  Filled 2015-03-26: qty 1

## 2015-04-17 ENCOUNTER — Other Ambulatory Visit: Payer: Self-pay | Admitting: *Deleted

## 2015-04-17 DIAGNOSIS — D631 Anemia in chronic kidney disease: Secondary | ICD-10-CM

## 2015-04-17 DIAGNOSIS — N189 Chronic kidney disease, unspecified: Principal | ICD-10-CM

## 2015-04-23 ENCOUNTER — Inpatient Hospital Stay: Payer: Medicare Other

## 2015-04-23 ENCOUNTER — Inpatient Hospital Stay (HOSPITAL_BASED_OUTPATIENT_CLINIC_OR_DEPARTMENT_OTHER): Payer: Medicare Other | Admitting: Oncology

## 2015-04-23 ENCOUNTER — Inpatient Hospital Stay: Payer: Medicare Other | Attending: Oncology

## 2015-04-23 VITALS — BP 146/87 | HR 83 | Temp 97.3°F | Resp 16 | Wt 116.2 lb

## 2015-04-23 DIAGNOSIS — D509 Iron deficiency anemia, unspecified: Secondary | ICD-10-CM

## 2015-04-23 DIAGNOSIS — N189 Chronic kidney disease, unspecified: Secondary | ICD-10-CM

## 2015-04-23 DIAGNOSIS — Z7982 Long term (current) use of aspirin: Secondary | ICD-10-CM | POA: Insufficient documentation

## 2015-04-23 DIAGNOSIS — Z79899 Other long term (current) drug therapy: Secondary | ICD-10-CM | POA: Insufficient documentation

## 2015-04-23 DIAGNOSIS — D631 Anemia in chronic kidney disease: Secondary | ICD-10-CM

## 2015-04-23 DIAGNOSIS — Z950 Presence of cardiac pacemaker: Secondary | ICD-10-CM

## 2015-04-23 DIAGNOSIS — I129 Hypertensive chronic kidney disease with stage 1 through stage 4 chronic kidney disease, or unspecified chronic kidney disease: Secondary | ICD-10-CM

## 2015-04-23 DIAGNOSIS — Z87891 Personal history of nicotine dependence: Secondary | ICD-10-CM | POA: Diagnosis not present

## 2015-04-23 LAB — HEMOGLOBIN: Hemoglobin: 10.1 g/dL — ABNORMAL LOW (ref 12.0–16.0)

## 2015-04-23 NOTE — Progress Notes (Signed)
Patient offers no problems or concerns today. 

## 2015-05-07 DIAGNOSIS — N182 Chronic kidney disease, stage 2 (mild): Secondary | ICD-10-CM | POA: Diagnosis not present

## 2015-05-07 DIAGNOSIS — K739 Chronic hepatitis, unspecified: Secondary | ICD-10-CM | POA: Diagnosis not present

## 2015-05-07 DIAGNOSIS — Z950 Presence of cardiac pacemaker: Secondary | ICD-10-CM | POA: Diagnosis not present

## 2015-05-07 DIAGNOSIS — R162 Hepatomegaly with splenomegaly, not elsewhere classified: Secondary | ICD-10-CM | POA: Diagnosis not present

## 2015-05-07 NOTE — Progress Notes (Signed)
[facility]  Telephone:(336) [phone] Fax:([phone]  ID: [name] OB: [birth year]  MR#: [id number]  AOZ#:[id number]  Patient Care Team: Corky Downs, MD as PCP - General (Internal Medicine)  CHIEF COMPLAINT:  Chief Complaint  Patient presents with  . Anemia    INTERVAL HISTORY: Patient returns to clinic today for repeat laboratory work, further evaluation, and continuation of Procrit.  She continues to feel well and is asymptomatic. She has no neurologic complaints.  She denies any recent fevers or illnesses.  She has a good appetite and denies weight loss. She has no chest pain or shortness of breath.  She denies any nausea, vomiting, constipation, or diarrhea.  She has no melena or hematochezia.  She has no urinary complaints.  Patient offers no specific complaints today.   REVIEW OF SYSTEMS:   Review of Systems  Constitutional: Negative.  Negative for malaise/fatigue.  Respiratory: Negative.   Cardiovascular: Negative.   Gastrointestinal: Negative.   Musculoskeletal: Negative.   Neurological: Negative.  Negative for weakness.    As per HPI. Otherwise, a complete review of systems is negatve.  PAST MEDICAL HISTORY: Past Medical History  Diagnosis Date  . Chronic kidney disease   . Anemia   . Hypertension   . Aneurysm   . Gastric ulcer     PAST SURGICAL HISTORY: Past Surgical History  Procedure Laterality Date  . Aneurysm coiling    . Colonoscopy      2013  . Pacemaker placement      FAMILY HISTORY Family History  Problem Relation Age of Onset  . Hypertension Mother   . Diabetes Father   . Cancer Sister   . Stroke Sister   . Hypertension Sister        ADVANCED DIRECTIVES:    HEALTH MAINTENANCE: Social History  Substance Use Topics  . Smoking status: Former Smoker -- 0.25 packs/day for 41 years    Types: Cigarettes  . Smokeless tobacco: Never Used  . Alcohol Use: No     Colonoscopy:  PAP:  Bone density:  Lipid  panel:  Allergies  Allergen Reactions  . Iodine Other (See Comments)    Unknown reaction  . Ivp Dye [Iodinated Diagnostic Agents] Rash    Current Outpatient Prescriptions  Medication Sig Dispense Refill  . amLODipine (NORVASC) 10 MG tablet Take 10 mg by mouth daily.    Marland Kitchen aspirin 81 MG tablet Take 81 mg by mouth daily.    Marland Kitchen labetalol (NORMODYNE) 200 MG tablet Take 200 mg by mouth 2 (two) times daily.     No current facility-administered medications for this visit.    OBJECTIVE: Filed Vitals:   04/23/15 1512  BP: 146/87  Pulse: 83  Temp: 97.3 F (36.3 C)  Resp: 16     Body mass index is 19.03 kg/(m^2).    ECOG FS:0 - Asymptomatic  General: Well-developed, well-nourished, no acute distress. Eyes: anicteric sclera. Lungs: Clear to auscultation bilaterally. Heart: Regular rate and rhythm. No rubs, murmurs, or gallops. Abdomen: Soft, nontender, nondistended. No organomegaly noted, normoactive bowel sounds. Musculoskeletal: No edema, cyanosis, or clubbing. Neuro: Alert, answering all questions appropriately. Cranial nerves grossly intact. Skin: No rashes or petechiae noted. Psych: Normal affect.   LAB RESULTS:  Lab Results  Component Value Date   NA 141 01/30/2014   K 5.0 01/30/2014   CL 116* 01/30/2014   CO2 17* 01/30/2014   GLUCOSE 93 01/30/2014   BUN 50* 06/19/2014   CREATININE 3.33* 06/19/2014   CALCIUM 8.8  01/30/2014   PROT 7.1 05/14/2013   ALBUMIN 3.9 01/30/2014   AST 14* 05/14/2013   ALT 13 05/14/2013   ALKPHOS 44* 05/14/2013   BILITOT 0.3 05/14/2013   GFRNONAA 15* 06/19/2014   GFRAA 18* 06/19/2014    Lab Results  Component Value Date   WBC 6.1 05/22/2014   NEUTROABS 3.7 05/22/2014   HGB 10.1* 04/23/2015   HCT 28.3* 05/22/2014   MCV 82 05/22/2014   PLT 236 05/22/2014     STUDIES: No results found.  ASSESSMENT: Anemia secondary to iron deficiency and chronic renal insufficiency.  PLAN:    1.  Anemia: Patient last received IV iron in April  2016. Although patient's hemoglobin is 10.1, therefore she does not require 40,000 units subcutaneous Procrit today. Return to clinic every 4 weeks for laboratory work and Procrit if her hemoglobin is below 10.0.  Patient will then return to clinic in 4 months for further evaluation.  Patient expressed understanding and was in agreement with this plan. She also understands that She can call clinic at any time with any questions, concerns, or complaints.    Jeralyn Ruthsimothy J Marilena Trevathan, MD   05/07/2015 4:24 PM

## 2015-05-08 DIAGNOSIS — H40003 Preglaucoma, unspecified, bilateral: Secondary | ICD-10-CM | POA: Diagnosis not present

## 2015-05-21 ENCOUNTER — Inpatient Hospital Stay: Payer: Medicare Other | Attending: Oncology

## 2015-05-21 ENCOUNTER — Inpatient Hospital Stay: Payer: Medicare Other

## 2015-05-21 VITALS — BP 137/77 | HR 86 | Resp 20

## 2015-05-21 DIAGNOSIS — D631 Anemia in chronic kidney disease: Secondary | ICD-10-CM

## 2015-05-21 DIAGNOSIS — D509 Iron deficiency anemia, unspecified: Secondary | ICD-10-CM | POA: Diagnosis not present

## 2015-05-21 DIAGNOSIS — N189 Chronic kidney disease, unspecified: Principal | ICD-10-CM

## 2015-05-21 LAB — HEMOGLOBIN: Hemoglobin: 8.1 g/dL — ABNORMAL LOW (ref 12.0–16.0)

## 2015-05-21 MED ORDER — EPOETIN ALFA 40000 UNIT/ML IJ SOLN
40000.0000 [IU] | Freq: Once | INTRAMUSCULAR | Status: AC
  Administered 2015-05-21: 40000 [IU] via SUBCUTANEOUS
  Filled 2015-05-21: qty 1

## 2015-06-05 DIAGNOSIS — E784 Other hyperlipidemia: Secondary | ICD-10-CM | POA: Diagnosis not present

## 2015-06-05 DIAGNOSIS — R5381 Other malaise: Secondary | ICD-10-CM | POA: Diagnosis not present

## 2015-06-05 DIAGNOSIS — Z23 Encounter for immunization: Secondary | ICD-10-CM | POA: Diagnosis not present

## 2015-06-05 DIAGNOSIS — Z0000 Encounter for general adult medical examination without abnormal findings: Secondary | ICD-10-CM | POA: Diagnosis not present

## 2015-06-05 DIAGNOSIS — I10 Essential (primary) hypertension: Secondary | ICD-10-CM | POA: Diagnosis not present

## 2015-06-08 ENCOUNTER — Other Ambulatory Visit: Payer: Self-pay | Admitting: Internal Medicine

## 2015-06-08 DIAGNOSIS — R162 Hepatomegaly with splenomegaly, not elsewhere classified: Secondary | ICD-10-CM

## 2015-06-08 DIAGNOSIS — R634 Abnormal weight loss: Secondary | ICD-10-CM

## 2015-06-12 ENCOUNTER — Ambulatory Visit: Payer: Medicare Other

## 2015-06-18 ENCOUNTER — Inpatient Hospital Stay: Payer: Medicare Other | Attending: Oncology

## 2015-06-18 ENCOUNTER — Inpatient Hospital Stay: Payer: Medicare Other

## 2015-06-18 VITALS — BP 136/76 | HR 82

## 2015-06-18 DIAGNOSIS — D509 Iron deficiency anemia, unspecified: Secondary | ICD-10-CM | POA: Insufficient documentation

## 2015-06-18 DIAGNOSIS — N189 Chronic kidney disease, unspecified: Principal | ICD-10-CM

## 2015-06-18 DIAGNOSIS — D631 Anemia in chronic kidney disease: Secondary | ICD-10-CM

## 2015-06-18 DIAGNOSIS — I129 Hypertensive chronic kidney disease with stage 1 through stage 4 chronic kidney disease, or unspecified chronic kidney disease: Secondary | ICD-10-CM | POA: Insufficient documentation

## 2015-06-18 LAB — HEMOGLOBIN: Hemoglobin: 8.2 g/dL — ABNORMAL LOW (ref 12.0–16.0)

## 2015-06-18 MED ORDER — EPOETIN ALFA 40000 UNIT/ML IJ SOLN
40000.0000 [IU] | Freq: Once | INTRAMUSCULAR | Status: AC
  Administered 2015-06-18: 40000 [IU] via SUBCUTANEOUS
  Filled 2015-06-18: qty 1

## 2015-06-19 ENCOUNTER — Ambulatory Visit
Admission: RE | Admit: 2015-06-19 | Discharge: 2015-06-19 | Disposition: A | Discharge status: Home / Self Care | Payer: Medicare Other | Source: Ambulatory Visit | Attending: Internal Medicine | Admitting: Internal Medicine

## 2015-06-19 DIAGNOSIS — R634 Abnormal weight loss: Secondary | ICD-10-CM | POA: Insufficient documentation

## 2015-06-19 DIAGNOSIS — K7689 Other specified diseases of liver: Secondary | ICD-10-CM | POA: Insufficient documentation

## 2015-06-19 DIAGNOSIS — N281 Cyst of kidney, acquired: Secondary | ICD-10-CM | POA: Insufficient documentation

## 2015-06-19 DIAGNOSIS — R188 Other ascites: Secondary | ICD-10-CM | POA: Diagnosis not present

## 2015-06-19 DIAGNOSIS — R162 Hepatomegaly with splenomegaly, not elsewhere classified: Secondary | ICD-10-CM

## 2015-06-19 DIAGNOSIS — I700 Atherosclerosis of aorta: Secondary | ICD-10-CM | POA: Diagnosis not present

## 2015-06-23 DIAGNOSIS — R634 Abnormal weight loss: Secondary | ICD-10-CM | POA: Diagnosis not present

## 2015-06-23 DIAGNOSIS — N183 Chronic kidney disease, stage 3 (moderate): Secondary | ICD-10-CM | POA: Diagnosis not present

## 2015-06-23 DIAGNOSIS — D638 Anemia in other chronic diseases classified elsewhere: Secondary | ICD-10-CM | POA: Diagnosis not present

## 2015-06-23 DIAGNOSIS — N182 Chronic kidney disease, stage 2 (mild): Secondary | ICD-10-CM | POA: Diagnosis not present

## 2015-07-16 ENCOUNTER — Inpatient Hospital Stay: Payer: Medicare Other

## 2015-07-16 ENCOUNTER — Inpatient Hospital Stay: Payer: Medicare Other | Attending: Oncology

## 2015-07-16 VITALS — BP 146/74 | HR 86

## 2015-07-16 DIAGNOSIS — D631 Anemia in chronic kidney disease: Secondary | ICD-10-CM

## 2015-07-16 DIAGNOSIS — D509 Iron deficiency anemia, unspecified: Secondary | ICD-10-CM | POA: Insufficient documentation

## 2015-07-16 DIAGNOSIS — N189 Chronic kidney disease, unspecified: Secondary | ICD-10-CM

## 2015-07-16 LAB — HEMOGLOBIN: HEMOGLOBIN: 7.8 g/dL — AB (ref 12.0–16.0)

## 2015-07-16 MED ORDER — EPOETIN ALFA 40000 UNIT/ML IJ SOLN
40000.0000 [IU] | Freq: Once | INTRAMUSCULAR | Status: AC
  Administered 2015-07-16: 40000 [IU] via SUBCUTANEOUS
  Filled 2015-07-16: qty 1

## 2015-08-07 DIAGNOSIS — R162 Hepatomegaly with splenomegaly, not elsewhere classified: Secondary | ICD-10-CM | POA: Diagnosis not present

## 2015-08-07 DIAGNOSIS — Z950 Presence of cardiac pacemaker: Secondary | ICD-10-CM | POA: Diagnosis not present

## 2015-08-07 DIAGNOSIS — H40003 Preglaucoma, unspecified, bilateral: Secondary | ICD-10-CM | POA: Diagnosis not present

## 2015-08-07 DIAGNOSIS — N182 Chronic kidney disease, stage 2 (mild): Secondary | ICD-10-CM | POA: Diagnosis not present

## 2015-08-13 ENCOUNTER — Inpatient Hospital Stay: Payer: Medicare Other

## 2015-08-13 ENCOUNTER — Inpatient Hospital Stay (HOSPITAL_BASED_OUTPATIENT_CLINIC_OR_DEPARTMENT_OTHER): Payer: Medicare Other | Admitting: Oncology

## 2015-08-13 ENCOUNTER — Inpatient Hospital Stay: Payer: Medicare Other | Attending: Oncology

## 2015-08-13 VITALS — BP 122/74 | HR 83 | Temp 97.6°F | Resp 16 | Wt 126.1 lb

## 2015-08-13 DIAGNOSIS — Z79899 Other long term (current) drug therapy: Secondary | ICD-10-CM | POA: Insufficient documentation

## 2015-08-13 DIAGNOSIS — N189 Chronic kidney disease, unspecified: Secondary | ICD-10-CM | POA: Insufficient documentation

## 2015-08-13 DIAGNOSIS — D631 Anemia in chronic kidney disease: Secondary | ICD-10-CM | POA: Insufficient documentation

## 2015-08-13 DIAGNOSIS — R5383 Other fatigue: Secondary | ICD-10-CM | POA: Diagnosis not present

## 2015-08-13 DIAGNOSIS — R5381 Other malaise: Secondary | ICD-10-CM

## 2015-08-13 DIAGNOSIS — D509 Iron deficiency anemia, unspecified: Secondary | ICD-10-CM

## 2015-08-13 DIAGNOSIS — Z7982 Long term (current) use of aspirin: Secondary | ICD-10-CM | POA: Insufficient documentation

## 2015-08-13 DIAGNOSIS — I129 Hypertensive chronic kidney disease with stage 1 through stage 4 chronic kidney disease, or unspecified chronic kidney disease: Secondary | ICD-10-CM

## 2015-08-13 DIAGNOSIS — R531 Weakness: Secondary | ICD-10-CM | POA: Diagnosis not present

## 2015-08-13 DIAGNOSIS — Z950 Presence of cardiac pacemaker: Secondary | ICD-10-CM | POA: Insufficient documentation

## 2015-08-13 DIAGNOSIS — Z87891 Personal history of nicotine dependence: Secondary | ICD-10-CM | POA: Diagnosis not present

## 2015-08-13 LAB — HEMOGLOBIN: HEMOGLOBIN: 7.9 g/dL — AB (ref 12.0–16.0)

## 2015-08-13 MED ORDER — EPOETIN ALFA 40000 UNIT/ML IJ SOLN
40000.0000 [IU] | Freq: Once | INTRAMUSCULAR | Status: AC
  Administered 2015-08-13: 40000 [IU] via SUBCUTANEOUS
  Filled 2015-08-13: qty 1

## 2015-08-13 NOTE — Progress Notes (Signed)
Patient is feeling more fatigued. 

## 2015-08-13 NOTE — Progress Notes (Signed)
[facility]  Telephone:(336) [phone] Fax:(336) [phone]  ID: Melissa Bradshaw OB: Aug 15, 1950  MR#: [id number]  AOZ#:[id number]  Patient Care Team: Corky Downs, MD as PCP - General (Internal Medicine)  CHIEF COMPLAINT:  Chief Complaint  Patient presents with  . Anemia    INTERVAL HISTORY: Patient returns to clinic today for repeat laboratory work, further evaluation, and continuation of Procrit.  She continues to feel well other than fatigue. She has no neurologic complaints.  She denies any recent fevers or illnesses.  She has a good appetite and denies weight loss. She has no chest pain or shortness of breath.  She denies any nausea, vomiting, constipation, or diarrhea.  She has no melena or hematochezia.  She has no urinary complaints.  Patient offers no further specific complaints today.   REVIEW OF SYSTEMS:   Review of Systems  Constitutional: Positive for malaise/fatigue.  Respiratory: Negative.   Cardiovascular: Negative.   Gastrointestinal: Negative.   Musculoskeletal: Negative.   Neurological: Positive for weakness.    As per HPI. Otherwise, a complete review of systems is negatve.  PAST MEDICAL HISTORY: Past Medical History  Diagnosis Date  . Chronic kidney disease   . Anemia   . Hypertension   . Aneurysm   . Gastric ulcer     PAST SURGICAL HISTORY: Past Surgical History  Procedure Laterality Date  . Aneurysm coiling    . Colonoscopy      2013  . Pacemaker placement      FAMILY HISTORY Family History  Problem Relation Age of Onset  . Hypertension Mother   . Diabetes Father   . Cancer Sister   . Stroke Sister   . Hypertension Sister        ADVANCED DIRECTIVES:    HEALTH MAINTENANCE: Social History  Substance Use Topics  . Smoking status: Former Smoker -- 0.25 packs/day for 41 years    Types: Cigarettes  . Smokeless tobacco: Never Used  . Alcohol Use: No     Colonoscopy:  PAP:  Bone density:  Lipid  panel:  Allergies  Allergen Reactions  . Iodine Other (See Comments)    Unknown reaction  . Ivp Dye [Iodinated Diagnostic Agents] Rash    Current Outpatient Prescriptions  Medication Sig Dispense Refill  . amLODipine (NORVASC) 10 MG tablet Take 10 mg by mouth daily.    Marland Kitchen aspirin 81 MG tablet Take 81 mg by mouth daily.    Marland Kitchen labetalol (NORMODYNE) 200 MG tablet Take 200 mg by mouth 2 (two) times daily.     No current facility-administered medications for this visit.    OBJECTIVE: Filed Vitals:   08/13/15 1400  BP: 122/74  Pulse: 83  Temp: 97.6 F (36.4 C)  Resp: 16     Body mass index is 20.66 kg/(m^2).    ECOG FS:0 - Asymptomatic  General: Well-developed, well-nourished, no acute distress. Eyes: anicteric sclera. Lungs: Clear to auscultation bilaterally. Heart: Regular rate and rhythm. No rubs, murmurs, or gallops. Abdomen: Soft, nontender, nondistended. No organomegaly noted, normoactive bowel sounds. Musculoskeletal: No edema, cyanosis, or clubbing. Neuro: Alert, answering all questions appropriately. Cranial nerves grossly intact. Skin: No rashes or petechiae noted. Psych: Normal affect.   LAB RESULTS:  Lab Results  Component Value Date   NA 141 01/30/2014   K 5.0 01/30/2014   CL 116* 01/30/2014   CO2 17* 01/30/2014   GLUCOSE 93 01/30/2014   BUN 50* 06/19/2014   CREATININE 3.33* 06/19/2014   CALCIUM 8.8 01/30/2014  PROT 7.1 05/14/2013   ALBUMIN 3.9 01/30/2014   AST 14* 05/14/2013   ALT 13 05/14/2013   ALKPHOS 44* 05/14/2013   BILITOT 0.3 05/14/2013   GFRNONAA 15* 06/19/2014   GFRAA 18* 06/19/2014    Lab Results  Component Value Date   WBC 6.1 05/22/2014   NEUTROABS 3.7 05/22/2014   HGB 7.9* 08/13/2015   HCT 28.3* 05/22/2014   MCV 82 05/22/2014   PLT 236 05/22/2014     STUDIES: No results found.  ASSESSMENT: Anemia secondary to iron deficiency and chronic renal insufficiency.  PLAN:    1.  Anemia: Patient last received IV iron in April  2016. Patient's hemoglobin is 7.9, therefore she will require 40,000 units subcutaneous Procrit today. Return to clinic every 4 weeks for laboratory work and Procrit if her hemoglobin is below 10.0. She will have iron studies and ferritin drawn with next blood draw in one month.  Patient will then return to clinic in 4 months for further evaluation.  Patient expressed understanding and was in agreement with this plan. She also understands that She can call clinic at any time with any questions, concerns, or complaints.    Genevie Cheshire, NP   08/13/2015 2:37 PM   Patient was seen and evaluated independently and I agree with the assessment and plan as dictated above.  Jeralyn Ruths, MD 08/14/2015 1:25 PM

## 2015-09-07 DIAGNOSIS — H401130 Primary open-angle glaucoma, bilateral, stage unspecified: Secondary | ICD-10-CM | POA: Diagnosis not present

## 2015-09-10 ENCOUNTER — Inpatient Hospital Stay: Payer: Medicare Other

## 2015-09-10 ENCOUNTER — Inpatient Hospital Stay: Payer: Medicare Other | Attending: Oncology

## 2015-09-10 VITALS — BP 150/80 | HR 85 | Temp 97.0°F | Resp 20

## 2015-09-10 DIAGNOSIS — I129 Hypertensive chronic kidney disease with stage 1 through stage 4 chronic kidney disease, or unspecified chronic kidney disease: Secondary | ICD-10-CM | POA: Insufficient documentation

## 2015-09-10 DIAGNOSIS — D631 Anemia in chronic kidney disease: Secondary | ICD-10-CM

## 2015-09-10 DIAGNOSIS — N189 Chronic kidney disease, unspecified: Secondary | ICD-10-CM | POA: Insufficient documentation

## 2015-09-10 DIAGNOSIS — D509 Iron deficiency anemia, unspecified: Secondary | ICD-10-CM | POA: Insufficient documentation

## 2015-09-10 LAB — CBC WITH DIFFERENTIAL/PLATELET
BASOS ABS: 0.1 10*3/uL (ref 0–0.1)
Basophils Relative: 2 %
EOS ABS: 0.1 10*3/uL (ref 0–0.7)
EOS PCT: 2 %
HCT: 22.5 % — ABNORMAL LOW (ref 35.0–47.0)
Hemoglobin: 7.6 g/dL — ABNORMAL LOW (ref 12.0–16.0)
LYMPHS PCT: 36 %
Lymphs Abs: 1.8 10*3/uL (ref 1.0–3.6)
MCH: 27 pg (ref 26.0–34.0)
MCHC: 33.8 g/dL (ref 32.0–36.0)
MCV: 79.7 fL — AB (ref 80.0–100.0)
MONO ABS: 0.3 10*3/uL (ref 0.2–0.9)
Monocytes Relative: 6 %
Neutro Abs: 2.7 10*3/uL (ref 1.4–6.5)
Neutrophils Relative %: 54 %
PLATELETS: 212 10*3/uL (ref 150–440)
RBC: 2.82 MIL/uL — AB (ref 3.80–5.20)
RDW: 18.3 % — AB (ref 11.5–14.5)
WBC: 5.1 10*3/uL (ref 3.6–11.0)

## 2015-09-10 LAB — FERRITIN: FERRITIN: 21 ng/mL (ref 11–307)

## 2015-09-10 LAB — IRON AND TIBC
Iron: 50 ug/dL (ref 28–170)
Saturation Ratios: 15 % (ref 10.4–31.8)
TIBC: 335 ug/dL (ref 250–450)
UIBC: 285 ug/dL

## 2015-09-10 MED ORDER — EPOETIN ALFA 40000 UNIT/ML IJ SOLN
40000.0000 [IU] | Freq: Once | INTRAMUSCULAR | Status: AC
  Administered 2015-09-10: 40000 [IU] via SUBCUTANEOUS
  Filled 2015-09-10: qty 1

## 2015-10-09 DIAGNOSIS — H401131 Primary open-angle glaucoma, bilateral, mild stage: Secondary | ICD-10-CM | POA: Diagnosis not present

## 2015-10-15 ENCOUNTER — Inpatient Hospital Stay: Payer: Medicare Other

## 2015-10-15 ENCOUNTER — Inpatient Hospital Stay: Payer: Medicare Other | Attending: Oncology

## 2015-10-15 VITALS — BP 135/75 | HR 92 | Resp 20

## 2015-10-15 DIAGNOSIS — D631 Anemia in chronic kidney disease: Secondary | ICD-10-CM

## 2015-10-15 DIAGNOSIS — N189 Chronic kidney disease, unspecified: Secondary | ICD-10-CM | POA: Diagnosis not present

## 2015-10-15 DIAGNOSIS — D509 Iron deficiency anemia, unspecified: Secondary | ICD-10-CM | POA: Insufficient documentation

## 2015-10-15 DIAGNOSIS — I129 Hypertensive chronic kidney disease with stage 1 through stage 4 chronic kidney disease, or unspecified chronic kidney disease: Secondary | ICD-10-CM | POA: Diagnosis not present

## 2015-10-15 LAB — HEMOGLOBIN: Hemoglobin: 7.2 g/dL — ABNORMAL LOW (ref 12.0–16.0)

## 2015-10-15 MED ORDER — EPOETIN ALFA 40000 UNIT/ML IJ SOLN
40000.0000 [IU] | Freq: Once | INTRAMUSCULAR | Status: AC
  Administered 2015-10-15: 40000 [IU] via SUBCUTANEOUS
  Filled 2015-10-15: qty 1

## 2015-11-05 DIAGNOSIS — D638 Anemia in other chronic diseases classified elsewhere: Secondary | ICD-10-CM | POA: Diagnosis not present

## 2015-11-05 DIAGNOSIS — R162 Hepatomegaly with splenomegaly, not elsewhere classified: Secondary | ICD-10-CM | POA: Diagnosis not present

## 2015-11-05 DIAGNOSIS — Z950 Presence of cardiac pacemaker: Secondary | ICD-10-CM | POA: Diagnosis not present

## 2015-11-05 DIAGNOSIS — N182 Chronic kidney disease, stage 2 (mild): Secondary | ICD-10-CM | POA: Diagnosis not present

## 2015-11-12 ENCOUNTER — Inpatient Hospital Stay: Payer: Medicare Other | Attending: Oncology

## 2015-11-12 ENCOUNTER — Inpatient Hospital Stay: Payer: Medicare Other

## 2015-11-12 VITALS — BP 136/73 | HR 88 | Temp 95.6°F | Resp 18

## 2015-11-12 DIAGNOSIS — N189 Chronic kidney disease, unspecified: Secondary | ICD-10-CM | POA: Insufficient documentation

## 2015-11-12 DIAGNOSIS — D509 Iron deficiency anemia, unspecified: Secondary | ICD-10-CM | POA: Diagnosis not present

## 2015-11-12 DIAGNOSIS — I129 Hypertensive chronic kidney disease with stage 1 through stage 4 chronic kidney disease, or unspecified chronic kidney disease: Secondary | ICD-10-CM | POA: Diagnosis not present

## 2015-11-12 DIAGNOSIS — D631 Anemia in chronic kidney disease: Secondary | ICD-10-CM

## 2015-11-12 LAB — HEMOGLOBIN: Hemoglobin: 7.7 g/dL — ABNORMAL LOW (ref 12.0–16.0)

## 2015-11-12 MED ORDER — EPOETIN ALFA 40000 UNIT/ML IJ SOLN
40000.0000 [IU] | Freq: Once | INTRAMUSCULAR | Status: AC
  Administered 2015-11-12: 40000 [IU] via SUBCUTANEOUS
  Filled 2015-11-12: qty 1

## 2015-12-24 DIAGNOSIS — I10 Essential (primary) hypertension: Secondary | ICD-10-CM | POA: Diagnosis not present

## 2015-12-24 DIAGNOSIS — E872 Acidosis: Secondary | ICD-10-CM | POA: Diagnosis not present

## 2015-12-24 DIAGNOSIS — N185 Chronic kidney disease, stage 5: Secondary | ICD-10-CM | POA: Diagnosis not present

## 2015-12-24 DIAGNOSIS — E875 Hyperkalemia: Secondary | ICD-10-CM | POA: Diagnosis not present

## 2016-01-11 ENCOUNTER — Inpatient Hospital Stay: Payer: Medicare Other

## 2016-01-11 ENCOUNTER — Inpatient Hospital Stay: Payer: Medicare Other | Admitting: Oncology

## 2016-01-12 DIAGNOSIS — N185 Chronic kidney disease, stage 5: Secondary | ICD-10-CM | POA: Diagnosis not present

## 2016-01-18 DIAGNOSIS — N2581 Secondary hyperparathyroidism of renal origin: Secondary | ICD-10-CM | POA: Diagnosis not present

## 2016-01-18 DIAGNOSIS — I10 Essential (primary) hypertension: Secondary | ICD-10-CM | POA: Diagnosis not present

## 2016-01-18 DIAGNOSIS — N185 Chronic kidney disease, stage 5: Secondary | ICD-10-CM | POA: Diagnosis not present

## 2016-01-18 DIAGNOSIS — R809 Proteinuria, unspecified: Secondary | ICD-10-CM | POA: Diagnosis not present

## 2016-01-18 DIAGNOSIS — E872 Acidosis: Secondary | ICD-10-CM | POA: Diagnosis not present

## 2016-01-31 DIAGNOSIS — N189 Chronic kidney disease, unspecified: Secondary | ICD-10-CM

## 2016-01-31 DIAGNOSIS — D631 Anemia in chronic kidney disease: Secondary | ICD-10-CM | POA: Insufficient documentation

## 2016-01-31 NOTE — Progress Notes (Deleted)
[facility]  Telephone:(336) [phone] Fax:(336) [phone]  ID: Melissa Bradshaw OB: Nov 18, 1950  MR#: [id number]  AOZ#:[id number]  Patient Care Team: Corky Downs, Melissa Bradshaw as PCP - General (Internal Medicine)  CHIEF COMPLAINT: Anemia secondary to iron deficiency and chronic renal insufficiency.  INTERVAL HISTORY: Patient returns to clinic today for repeat laboratory work, further evaluation, and continuation of Procrit.  She continues to feel well other than fatigue. She has no neurologic complaints.  She denies any recent fevers or illnesses.  She has a good appetite and denies weight loss. She has no chest pain or shortness of breath.  She denies any nausea, vomiting, constipation, or diarrhea.  She has no melena or hematochezia.  She has no urinary complaints.  Patient offers no further specific complaints today.   REVIEW OF SYSTEMS:   Review of Systems  Constitutional: Positive for malaise/fatigue.  Respiratory: Negative.   Cardiovascular: Negative.   Gastrointestinal: Negative.   Musculoskeletal: Negative.   Neurological: Positive for weakness.    As per HPI. Otherwise, a complete review of systems is negatve.  PAST MEDICAL HISTORY: Past Medical History:  Diagnosis Date  . Anemia   . Aneurysm   . Chronic kidney disease   . Gastric ulcer   . Hypertension     PAST SURGICAL HISTORY: Past Surgical History:  Procedure Laterality Date  . ANEURYSM COILING    . COLONOSCOPY     2013  . PACEMAKER PLACEMENT      FAMILY HISTORY Family History  Problem Relation Age of Onset  . Hypertension Mother   . Diabetes Father   . Cancer Sister   . Stroke Sister   . Hypertension Sister        ADVANCED DIRECTIVES:    HEALTH MAINTENANCE: Social History  Substance Use Topics  . Smoking status: Former Smoker    Packs/day: 0.25    Years: 41.00    Types: Cigarettes  . Smokeless tobacco: Never Used  . Alcohol use No     Colonoscopy:  PAP:  Bone density:  Lipid  panel:  Allergies  Allergen Reactions  . Iodine Other (See Comments)    Unknown reaction  . Ivp Dye [Iodinated Diagnostic Agents] Rash    Current Outpatient Prescriptions  Medication Sig Dispense Refill  . amLODipine (NORVASC) 10 MG tablet Take 10 mg by mouth daily.    Marland Kitchen aspirin 81 MG tablet Take 81 mg by mouth daily.    Marland Kitchen labetalol (NORMODYNE) 200 MG tablet Take 200 mg by mouth 2 (two) times daily.     No current facility-administered medications for this visit.     OBJECTIVE: There were no vitals filed for this visit.   There is no height or weight on file to calculate BMI.    ECOG FS:0 - Asymptomatic  General: Well-developed, well-nourished, no acute distress. Eyes: anicteric sclera. Lungs: Clear to auscultation bilaterally. Heart: Regular rate and rhythm. No rubs, murmurs, or gallops. Abdomen: Soft, nontender, nondistended. No organomegaly noted, normoactive bowel sounds. Musculoskeletal: No edema, cyanosis, or clubbing. Neuro: Alert, answering all questions appropriately. Cranial nerves grossly intact. Skin: No rashes or petechiae noted. Psych: Normal affect.   LAB RESULTS:  Lab Results  Component Value Date   NA 141 01/30/2014   K 5.0 01/30/2014   CL 116 (H) 01/30/2014   CO2 17 (L) 01/30/2014   GLUCOSE 93 01/30/2014   BUN 50 (H) 06/19/2014   CREATININE 3.33 (H) 06/19/2014   CALCIUM 8.8 01/30/2014   PROT 7.1 05/14/2013  ALBUMIN 3.9 01/30/2014   AST 14 (L) 05/14/2013   ALT 13 05/14/2013   ALKPHOS 44 (L) 05/14/2013   BILITOT 0.3 05/14/2013   GFRNONAA 15 (L) 06/19/2014   GFRAA 18 (L) 06/19/2014    Lab Results  Component Value Date   WBC 5.1 09/10/2015   NEUTROABS 2.7 09/10/2015   HGB 7.7 (L) 11/12/2015   HCT 22.5 (L) 09/10/2015   MCV 79.7 (L) 09/10/2015   PLT 212 09/10/2015     STUDIES: No results found.  ASSESSMENT: Anemia secondary to iron deficiency and chronic renal insufficiency.  PLAN:    1.  Anemia secondary to iron deficiency and  chronic renal insufficiency: Patient last received IV iron in April 2016. Patient's hemoglobin is 7.9, therefore she will require 40,000 units subcutaneous Procrit today. Return to clinic every 4 weeks for laboratory work and Procrit if her hemoglobin is below 10.0. She will have iron studies and ferritin drawn with next blood draw in one month.  Patient will then return to clinic in 4 months for further evaluation.  Patient expressed understanding and was in agreement with this plan. She also understands that She can call clinic at any time with any questions, concerns, or complaints.    Jeralyn Ruths, Melissa Bradshaw   01/31/2016 3:15 PM

## 2016-02-01 ENCOUNTER — Inpatient Hospital Stay: Payer: Medicare Other

## 2016-02-01 ENCOUNTER — Inpatient Hospital Stay: Payer: Medicare Other | Attending: Oncology

## 2016-02-01 ENCOUNTER — Inpatient Hospital Stay: Payer: Medicare Other | Admitting: Oncology

## 2016-02-04 DIAGNOSIS — Z950 Presence of cardiac pacemaker: Secondary | ICD-10-CM | POA: Diagnosis not present

## 2016-02-04 DIAGNOSIS — N182 Chronic kidney disease, stage 2 (mild): Secondary | ICD-10-CM | POA: Diagnosis not present

## 2016-02-04 DIAGNOSIS — N183 Chronic kidney disease, stage 3 (moderate): Secondary | ICD-10-CM | POA: Diagnosis not present

## 2016-02-04 DIAGNOSIS — R162 Hepatomegaly with splenomegaly, not elsewhere classified: Secondary | ICD-10-CM | POA: Diagnosis not present

## 2016-02-23 NOTE — Progress Notes (Deleted)
[facility]  [phone] [phone]) [phone]  ID: Tristan Schroederaroline J Anwar OB: 10/10/1950  MR#: [id number]  AOZ#:[id number]#:[id number]  Patient Care Team: Corky DownsJaved Masoud, MD as PCP - General (Internal Medicine)  CHIEF COMPLAINT: Anemia secondary to chronic renal insufficiency.  INTERVAL HISTORY: Patient returns to clinic today for repeat laboratory work, further evaluation, and continuation of Procrit.  She continues to feel well other than fatigue. She has no neurologic complaints.  She denies any recent fevers or illnesses.  She has a good appetite and denies weight loss. She has no chest pain or shortness of breath.  She denies any nausea, vomiting, constipation, or diarrhea.  She has no melena or hematochezia.  She has no urinary complaints.  Patient offers no further specific complaints today.   REVIEW OF SYSTEMS:   Review of Systems  Constitutional: Positive for malaise/fatigue.  Respiratory: Negative.   Cardiovascular: Negative.   Gastrointestinal: Negative.   Musculoskeletal: Negative.   Neurological: Positive for weakness.    As per HPI. Otherwise, a complete review of systems is negatve.  PAST MEDICAL HISTORY: Past Medical History:  Diagnosis Date  . Anemia   . Aneurysm   . Chronic kidney disease   . Gastric ulcer   . Hypertension     PAST SURGICAL HISTORY: Past Surgical History:  Procedure Laterality Date  . ANEURYSM COILING    . COLONOSCOPY     2013  . PACEMAKER PLACEMENT      FAMILY HISTORY Family History  Problem Relation Age of Onset  . Hypertension Mother   . Diabetes Father   . Cancer Sister   . Stroke Sister   . Hypertension Sister        ADVANCED DIRECTIVES:    HEALTH MAINTENANCE: Social History  Substance Use Topics  . Smoking status: Former Smoker    Packs/day: 0.25    Years: 41.00    Types: Cigarettes  . Smokeless tobacco: Never Used  . Alcohol use No     Colonoscopy:  PAP:  Bone density:  Lipid panel:  Allergies   Allergen Reactions  . Iodine Other (See Comments)    Unknown reaction  . Ivp Dye [Iodinated Diagnostic Agents] Rash    Current Outpatient Prescriptions  Medication Sig Dispense Refill  . amLODipine (NORVASC) 10 MG tablet Take 10 mg by mouth daily.    Marland Kitchen. aspirin 81 MG tablet Take 81 mg by mouth daily.    Marland Kitchen. labetalol (NORMODYNE) 200 MG tablet Take 200 mg by mouth 2 (two) times daily.     No current facility-administered medications for this visit.     OBJECTIVE: There were no vitals filed for this visit.   There is no height or weight on file to calculate BMI.    ECOG FS:0 - Asymptomatic  General: Well-developed, well-nourished, no acute distress. Eyes: anicteric sclera. Lungs: Clear to auscultation bilaterally. Heart: Regular rate and rhythm. No rubs, murmurs, or gallops. Abdomen: Soft, nontender, nondistended. No organomegaly noted, normoactive bowel sounds. Musculoskeletal: No edema, cyanosis, or clubbing. Neuro: Alert, answering all questions appropriately. Cranial nerves grossly intact. Skin: No rashes or petechiae noted. Psych: Normal affect.   LAB RESULTS:  Lab Results  Component Value Date   NA 141 01/30/2014   K 5.0 01/30/2014   CL 116 (H) 01/30/2014   CO2 17 (L) 01/30/2014   GLUCOSE 93 01/30/2014   BUN 50 (H) 06/19/2014   CREATININE 3.33 (H) 06/19/2014   CALCIUM 8.8 01/30/2014   PROT 7.1 05/14/2013   ALBUMIN 3.9 01/30/2014  AST 14 (L) 05/14/2013   ALT 13 05/14/2013   ALKPHOS 44 (L) 05/14/2013   BILITOT 0.3 05/14/2013   GFRNONAA 15 (L) 06/19/2014   GFRAA 18 (L) 06/19/2014    Lab Results  Component Value Date   WBC 5.1 09/10/2015   NEUTROABS 2.7 09/10/2015   HGB 7.7 (L) 11/12/2015   HCT 22.5 (L) 09/10/2015   MCV 79.7 (L) 09/10/2015   PLT 212 09/10/2015     STUDIES: No results found.  ASSESSMENT: Anemia secondary to chronic renal insufficiency.  PLAN:    1.  Anemia secondary to chronic renal insufficiency: Patient last received IV iron in  April 2016. Patient's hemoglobin is 7.9, therefore she will require 40,000 units subcutaneous Procrit today. Return to clinic every 4 weeks for laboratory work and Procrit if her hemoglobin is below 10.0. She will have iron studies and ferritin drawn with next blood draw in one month.  Patient will then return to clinic in 4 months for further evaluation.  Patient expressed understanding and was in agreement with this plan. She also understands that She can call clinic at any time with any questions, concerns, or complaints.    Jeralyn Ruthsimothy J Brittne Kawasaki, MD   02/23/2016 11:55 PM

## 2016-02-24 ENCOUNTER — Inpatient Hospital Stay: Payer: Medicare Other

## 2016-02-24 ENCOUNTER — Inpatient Hospital Stay: Payer: Medicare Other | Admitting: Oncology

## 2016-02-25 ENCOUNTER — Encounter: Payer: Self-pay | Admitting: *Deleted

## 2016-03-01 ENCOUNTER — Telehealth: Payer: Self-pay | Admitting: *Deleted

## 2016-03-01 NOTE — Telephone Encounter (Signed)
Pt called to inform our office that she received her certified letter and that she no longer needs procrit injections. States she feels better with out them.

## 2016-05-05 DIAGNOSIS — R162 Hepatomegaly with splenomegaly, not elsewhere classified: Secondary | ICD-10-CM | POA: Diagnosis not present

## 2016-05-05 DIAGNOSIS — N182 Chronic kidney disease, stage 2 (mild): Secondary | ICD-10-CM | POA: Diagnosis not present

## 2016-05-05 DIAGNOSIS — Z950 Presence of cardiac pacemaker: Secondary | ICD-10-CM | POA: Diagnosis not present

## 2016-05-05 DIAGNOSIS — D638 Anemia in other chronic diseases classified elsewhere: Secondary | ICD-10-CM | POA: Diagnosis not present

## 2016-05-06 ENCOUNTER — Other Ambulatory Visit: Payer: Self-pay | Admitting: Internal Medicine

## 2016-05-06 DIAGNOSIS — R1900 Intra-abdominal and pelvic swelling, mass and lump, unspecified site: Secondary | ICD-10-CM

## 2016-05-09 ENCOUNTER — Ambulatory Visit
Admission: RE | Admit: 2016-05-09 | Discharge: 2016-05-09 | Disposition: A | Discharge status: Home / Self Care | Payer: Medicare Other | Source: Ambulatory Visit | Attending: Internal Medicine | Admitting: Internal Medicine

## 2016-05-09 ENCOUNTER — Ambulatory Visit: Payer: Medicare Other

## 2016-05-09 DIAGNOSIS — R1900 Intra-abdominal and pelvic swelling, mass and lump, unspecified site: Secondary | ICD-10-CM | POA: Diagnosis present

## 2016-05-09 DIAGNOSIS — K7689 Other specified diseases of liver: Secondary | ICD-10-CM | POA: Diagnosis not present

## 2016-05-09 DIAGNOSIS — R188 Other ascites: Secondary | ICD-10-CM | POA: Insufficient documentation

## 2016-05-17 DIAGNOSIS — N182 Chronic kidney disease, stage 2 (mild): Secondary | ICD-10-CM | POA: Diagnosis not present

## 2016-05-17 DIAGNOSIS — I10 Essential (primary) hypertension: Secondary | ICD-10-CM | POA: Diagnosis not present

## 2016-05-17 DIAGNOSIS — N183 Chronic kidney disease, stage 3 (moderate): Secondary | ICD-10-CM | POA: Diagnosis not present

## 2016-05-17 DIAGNOSIS — K739 Chronic hepatitis, unspecified: Secondary | ICD-10-CM | POA: Diagnosis not present

## 2016-05-31 ENCOUNTER — Inpatient Hospital Stay
Admission: EM | Admit: 2016-05-31 | Discharge: 2016-06-07 | DRG: 286 | Disposition: A | Discharge status: Home / Self Care | Payer: Medicare Other | Attending: Internal Medicine | Admitting: Internal Medicine

## 2016-05-31 ENCOUNTER — Encounter: Payer: Self-pay | Admitting: Emergency Medicine

## 2016-05-31 DIAGNOSIS — R188 Other ascites: Secondary | ICD-10-CM | POA: Diagnosis present

## 2016-05-31 DIAGNOSIS — I132 Hypertensive heart and chronic kidney disease with heart failure and with stage 5 chronic kidney disease, or end stage renal disease: Principal | ICD-10-CM | POA: Diagnosis present

## 2016-05-31 DIAGNOSIS — Z8711 Personal history of peptic ulcer disease: Secondary | ICD-10-CM

## 2016-05-31 DIAGNOSIS — Q613 Polycystic kidney, unspecified: Secondary | ICD-10-CM

## 2016-05-31 DIAGNOSIS — I509 Heart failure, unspecified: Secondary | ICD-10-CM | POA: Diagnosis not present

## 2016-05-31 DIAGNOSIS — N186 End stage renal disease: Secondary | ICD-10-CM | POA: Diagnosis not present

## 2016-05-31 DIAGNOSIS — I5023 Acute on chronic systolic (congestive) heart failure: Secondary | ICD-10-CM | POA: Diagnosis not present

## 2016-05-31 DIAGNOSIS — Z7982 Long term (current) use of aspirin: Secondary | ICD-10-CM | POA: Diagnosis not present

## 2016-05-31 DIAGNOSIS — Z87891 Personal history of nicotine dependence: Secondary | ICD-10-CM | POA: Diagnosis not present

## 2016-05-31 DIAGNOSIS — R601 Generalized edema: Secondary | ICD-10-CM

## 2016-05-31 DIAGNOSIS — N189 Chronic kidney disease, unspecified: Secondary | ICD-10-CM | POA: Diagnosis not present

## 2016-05-31 DIAGNOSIS — Q446 Cystic disease of liver: Secondary | ICD-10-CM | POA: Diagnosis not present

## 2016-05-31 DIAGNOSIS — D649 Anemia, unspecified: Secondary | ICD-10-CM | POA: Diagnosis not present

## 2016-05-31 DIAGNOSIS — R262 Difficulty in walking, not elsewhere classified: Secondary | ICD-10-CM

## 2016-05-31 DIAGNOSIS — I2510 Atherosclerotic heart disease of native coronary artery without angina pectoris: Secondary | ICD-10-CM | POA: Diagnosis present

## 2016-05-31 DIAGNOSIS — I313 Pericardial effusion (noninflammatory): Secondary | ICD-10-CM | POA: Diagnosis not present

## 2016-05-31 DIAGNOSIS — I10 Essential (primary) hypertension: Secondary | ICD-10-CM | POA: Diagnosis not present

## 2016-05-31 DIAGNOSIS — Z23 Encounter for immunization: Secondary | ICD-10-CM

## 2016-05-31 DIAGNOSIS — Z8673 Personal history of transient ischemic attack (TIA), and cerebral infarction without residual deficits: Secondary | ICD-10-CM

## 2016-05-31 DIAGNOSIS — N2581 Secondary hyperparathyroidism of renal origin: Secondary | ICD-10-CM | POA: Diagnosis present

## 2016-05-31 DIAGNOSIS — D631 Anemia in chronic kidney disease: Secondary | ICD-10-CM | POA: Diagnosis present

## 2016-05-31 DIAGNOSIS — R627 Adult failure to thrive: Secondary | ICD-10-CM | POA: Diagnosis present

## 2016-05-31 DIAGNOSIS — N179 Acute kidney failure, unspecified: Secondary | ICD-10-CM | POA: Diagnosis not present

## 2016-05-31 DIAGNOSIS — R0602 Shortness of breath: Secondary | ICD-10-CM | POA: Diagnosis not present

## 2016-05-31 DIAGNOSIS — E8770 Fluid overload, unspecified: Secondary | ICD-10-CM | POA: Diagnosis not present

## 2016-05-31 DIAGNOSIS — K739 Chronic hepatitis, unspecified: Secondary | ICD-10-CM | POA: Diagnosis not present

## 2016-05-31 DIAGNOSIS — E872 Acidosis: Secondary | ICD-10-CM | POA: Diagnosis not present

## 2016-05-31 DIAGNOSIS — E43 Unspecified severe protein-calorie malnutrition: Secondary | ICD-10-CM | POA: Diagnosis present

## 2016-05-31 DIAGNOSIS — M6281 Muscle weakness (generalized): Secondary | ICD-10-CM

## 2016-05-31 DIAGNOSIS — I5021 Acute systolic (congestive) heart failure: Secondary | ICD-10-CM | POA: Diagnosis not present

## 2016-05-31 DIAGNOSIS — Z992 Dependence on renal dialysis: Secondary | ICD-10-CM | POA: Diagnosis not present

## 2016-05-31 DIAGNOSIS — D638 Anemia in other chronic diseases classified elsewhere: Secondary | ICD-10-CM | POA: Diagnosis not present

## 2016-05-31 DIAGNOSIS — Z681 Body mass index (BMI) 19 or less, adult: Secondary | ICD-10-CM | POA: Diagnosis not present

## 2016-05-31 DIAGNOSIS — M7989 Other specified soft tissue disorders: Secondary | ICD-10-CM | POA: Diagnosis not present

## 2016-05-31 DIAGNOSIS — I739 Peripheral vascular disease, unspecified: Secondary | ICD-10-CM | POA: Diagnosis not present

## 2016-05-31 DIAGNOSIS — R600 Localized edema: Secondary | ICD-10-CM | POA: Diagnosis not present

## 2016-05-31 DIAGNOSIS — Z950 Presence of cardiac pacemaker: Secondary | ICD-10-CM | POA: Diagnosis not present

## 2016-05-31 DIAGNOSIS — R162 Hepatomegaly with splenomegaly, not elsewhere classified: Secondary | ICD-10-CM | POA: Diagnosis not present

## 2016-05-31 DIAGNOSIS — I120 Hypertensive chronic kidney disease with stage 5 chronic kidney disease or end stage renal disease: Secondary | ICD-10-CM | POA: Diagnosis not present

## 2016-05-31 DIAGNOSIS — Z79899 Other long term (current) drug therapy: Secondary | ICD-10-CM

## 2016-05-31 HISTORY — DX: Atherosclerotic heart disease of native coronary artery without angina pectoris: I25.10

## 2016-05-31 HISTORY — DX: Presence of cardiac pacemaker: Z95.0

## 2016-05-31 HISTORY — DX: Heart failure, unspecified: I50.9

## 2016-05-31 LAB — URINALYSIS COMPLETE WITH MICROSCOPIC (ARMC ONLY)
BILIRUBIN URINE: NEGATIVE
GLUCOSE, UA: NEGATIVE mg/dL
Ketones, ur: NEGATIVE mg/dL
LEUKOCYTES UA: NEGATIVE
NITRITE: NEGATIVE
Protein, ur: 100 mg/dL — AB
SPECIFIC GRAVITY, URINE: 1.01 (ref 1.005–1.030)
pH: 5 (ref 5.0–8.0)

## 2016-05-31 LAB — CBC
HCT: 23.3 % — ABNORMAL LOW (ref 35.0–47.0)
Hemoglobin: 7.9 g/dL — ABNORMAL LOW (ref 12.0–16.0)
MCH: 27 pg (ref 26.0–34.0)
MCHC: 33.8 g/dL (ref 32.0–36.0)
MCV: 80.1 fL (ref 80.0–100.0)
PLATELETS: 410 10*3/uL (ref 150–440)
RBC: 2.9 MIL/uL — ABNORMAL LOW (ref 3.80–5.20)
RDW: 20.5 % — AB (ref 11.5–14.5)
WBC: 5.4 10*3/uL (ref 3.6–11.0)

## 2016-05-31 LAB — BASIC METABOLIC PANEL
Anion gap: 13 (ref 5–15)
BUN: 48 mg/dL — AB (ref 6–20)
CALCIUM: 8.6 mg/dL — AB (ref 8.9–10.3)
CO2: 12 mmol/L — ABNORMAL LOW (ref 22–32)
CREATININE: 5.99 mg/dL — AB (ref 0.44–1.00)
Chloride: 111 mmol/L (ref 101–111)
GFR calc Af Amer: 8 mL/min — ABNORMAL LOW (ref >60)
GFR, EST NON AFRICAN AMERICAN: 7 mL/min — AB (ref >60)
Glucose, Bld: 101 mg/dL — ABNORMAL HIGH (ref 65–99)
POTASSIUM: 4.4 mmol/L (ref 3.5–5.1)
SODIUM: 136 mmol/L (ref 135–145)

## 2016-05-31 LAB — BRAIN NATRIURETIC PEPTIDE: B Natriuretic Peptide: 279 pg/mL — ABNORMAL HIGH (ref 0.0–100.0)

## 2016-05-31 MED ORDER — PNEUMOCOCCAL VAC POLYVALENT 25 MCG/0.5ML IJ INJ
0.5000 mL | INJECTION | INTRAMUSCULAR | Status: AC
  Administered 2016-06-01: 0.5 mL via INTRAMUSCULAR
  Filled 2016-05-31: qty 0.5

## 2016-05-31 MED ORDER — ASPIRIN EC 81 MG PO TBEC
81.0000 mg | DELAYED_RELEASE_TABLET | Freq: Every day | ORAL | Status: DC
  Administered 2016-06-01 – 2016-06-07 (×7): 81 mg via ORAL
  Filled 2016-05-31 (×7): qty 1

## 2016-05-31 MED ORDER — LATANOPROST 0.005 % OP SOLN
1.0000 [drp] | Freq: Every day | OPHTHALMIC | Status: DC
  Administered 2016-05-31 – 2016-06-06 (×7): 1 [drp] via OPHTHALMIC
  Filled 2016-05-31: qty 2.5

## 2016-05-31 MED ORDER — HYDRALAZINE HCL 25 MG PO TABS
25.0000 mg | ORAL_TABLET | Freq: Three times a day (TID) | ORAL | Status: DC
  Administered 2016-05-31 – 2016-06-07 (×15): 25 mg via ORAL
  Filled 2016-05-31 (×17): qty 1

## 2016-05-31 MED ORDER — FUROSEMIDE 10 MG/ML IJ SOLN
5.0000 mg/h | INTRAVENOUS | Status: DC
  Administered 2016-05-31 – 2016-06-04 (×3): 5 mg/h via INTRAVENOUS
  Filled 2016-05-31 (×3): qty 25

## 2016-05-31 MED ORDER — HEPARIN SODIUM (PORCINE) 5000 UNIT/ML IJ SOLN
5000.0000 [IU] | Freq: Three times a day (TID) | INTRAMUSCULAR | Status: DC
  Administered 2016-05-31 – 2016-06-07 (×18): 5000 [IU] via SUBCUTANEOUS
  Filled 2016-05-31 (×20): qty 1

## 2016-05-31 MED ORDER — INFLUENZA VAC SPLIT QUAD 0.5 ML IM SUSY
0.5000 mL | PREFILLED_SYRINGE | INTRAMUSCULAR | Status: AC
  Administered 2016-06-01: 0.5 mL via INTRAMUSCULAR
  Filled 2016-05-31: qty 0.5

## 2016-05-31 MED ORDER — MORPHINE SULFATE (PF) 4 MG/ML IV SOLN
1.0000 mg | Freq: Once | INTRAVENOUS | Status: AC
  Administered 2016-05-31: 1 mg via INTRAVENOUS
  Filled 2016-05-31: qty 1

## 2016-05-31 MED ORDER — AMLODIPINE BESYLATE 10 MG PO TABS
10.0000 mg | ORAL_TABLET | Freq: Every day | ORAL | Status: DC
  Administered 2016-06-01: 10 mg via ORAL
  Filled 2016-05-31 (×2): qty 1

## 2016-05-31 MED ORDER — ONDANSETRON HCL 4 MG/2ML IJ SOLN
4.0000 mg | Freq: Four times a day (QID) | INTRAMUSCULAR | Status: DC
  Administered 2016-05-31 – 2016-06-07 (×21): 4 mg via INTRAVENOUS
  Filled 2016-05-31 (×23): qty 2

## 2016-05-31 MED ORDER — HYDRALAZINE HCL 20 MG/ML IJ SOLN
10.0000 mg | Freq: Four times a day (QID) | INTRAMUSCULAR | Status: DC | PRN
  Administered 2016-05-31: 10 mg via INTRAVENOUS

## 2016-05-31 MED ORDER — ONDANSETRON HCL 4 MG/2ML IJ SOLN
4.0000 mg | Freq: Once | INTRAMUSCULAR | Status: AC
  Administered 2016-05-31: 4 mg via INTRAVENOUS
  Filled 2016-05-31: qty 2

## 2016-05-31 MED ORDER — HYDRALAZINE HCL 20 MG/ML IJ SOLN
INTRAMUSCULAR | Status: AC
  Administered 2016-05-31: 10 mg via INTRAVENOUS
  Filled 2016-05-31: qty 1

## 2016-05-31 MED ORDER — LABETALOL HCL 200 MG PO TABS
200.0000 mg | ORAL_TABLET | Freq: Two times a day (BID) | ORAL | Status: DC
  Administered 2016-05-31 – 2016-06-01 (×3): 200 mg via ORAL
  Filled 2016-05-31 (×4): qty 1

## 2016-05-31 MED ORDER — HYDRALAZINE HCL 50 MG PO TABS
ORAL_TABLET | ORAL | Status: AC
  Administered 2016-05-31: 25 mg via ORAL
  Filled 2016-05-31: qty 1

## 2016-05-31 NOTE — ED Notes (Addendum)
Received VORB for 1mg  Morphine IV, 4 mg [place]  I notified Stacy RN of this administration.

## 2016-05-31 NOTE — ED Triage Notes (Signed)
Pt to ed with c/o swelling in feet and ankles and abd distention.  Pt states she has been swollen for several months and was started on lasix about 1 week ago and reports there has been some decrease in swelling but reports she get sob with any activity.

## 2016-05-31 NOTE — ED Notes (Signed)
Replaced RA missing lead.  Patient also stated her CP was coming back and the nausea was returning.  I paged the admitting MD and will ask for pain/nausea meds.

## 2016-05-31 NOTE — ED Notes (Signed)
Per admitting MD patient is on 1200 mL fluid restriction.

## 2016-05-31 NOTE — ED Notes (Signed)
A call was routed through to my ASCOM for this patient's family, I gave the family member my phone for them to talk.

## 2016-05-31 NOTE — ED Notes (Signed)
Paged Admitting MD again regarding pain management and nausea medications.

## 2016-05-31 NOTE — ED Notes (Signed)
Patient agreeable to try IV lasix again with current IV.

## 2016-05-31 NOTE — H&P (Signed)
[facility] [place] [facility]   PATIENT NAME: Melissa PortelaCaroline Bradshaw    MR#:  [id number]  DATE OF BIRTH:  05/22/1951  DATE OF ADMISSION:  05/31/2016  PRIMARY CARE PHYSICIAN: Corky DownsMASOUD,JAVED, MD   REQUESTING/REFERRING PHYSICIAN: Williams  CHIEF COMPLAINT:   Chief Complaint  Patient presents with  . Leg Swelling    HISTORY OF PRESENT ILLNESS: Melissa Bradshaw  is a 65 y.o. female with a known history of Diabetes, congestive heart failure, hypertension, chronic kidney disease, status post cardiac pacemaker, brain aneurysm status post coiling- started having worsening swelling on her legs and abdomen and increasing shortness of breath with minimal activity for last 4-5 months. Following regularly with primary care physician and nephrologist. She was recently started on higher dose of oral Lasix which seems to be helping some. But her persistent leg edema prompted her primary care physician to send her to emergency room today and advised that she may need to get hemodialysis. Creatinine is elevated with severe edema and ascites in the ER physician spoke to nephrologist is suggested to start on IV Lasix drip for now.   PAST MEDICAL HISTORY:   Past Medical History:  Diagnosis Date  . Anemia   . Aneurysm (HCC)   . Cardiac pacemaker   . CHF (congestive heart failure) (HCC)   . Chronic kidney disease   . Coronary artery disease   . Gastric ulcer   . Hypertension     PAST SURGICAL HISTORY: Past Surgical History:  Procedure Laterality Date  . ANEURYSM COILING    . COLONOSCOPY     2013  . PACEMAKER PLACEMENT      SOCIAL HISTORY:  Social History  Substance Use Topics  . Smoking status: Former Smoker    Packs/day: 0.25    Years: 41.00    Types: Cigarettes  . Smokeless tobacco: Never Used  . Alcohol use No    FAMILY HISTORY:  Family History  Problem Relation Age of Onset  . Hypertension Mother   . Diabetes Father   . Cancer Sister   . Stroke Sister   .  Hypertension Sister     DRUG ALLERGIES:  Allergies  Allergen Reactions  . Iodine Other (See Comments)    Unknown reaction  . Ivp Dye [Iodinated Diagnostic Agents] Rash    REVIEW OF SYSTEMS:   CONSTITUTIONAL: No fever, fatigue or weakness.  EYES: No blurred or double vision.  EARS, NOSE, AND THROAT: No tinnitus or ear pain.  RESPIRATORY: No cough, Positive for shortness of breath, no wheezing or hemoptysis.  CARDIOVASCULAR: No chest pain, orthopnea, positive for bilateral edema.  GASTROINTESTINAL: No nausea, vomiting, diarrhea or abdominal pain.  GENITOURINARY: No dysuria, hematuria.  ENDOCRINE: No polyuria, nocturia,  HEMATOLOGY: No anemia, easy bruising or bleeding SKIN: No rash or lesion. MUSCULOSKELETAL: No joint pain or arthritis.   NEUROLOGIC: No tingling, numbness, weakness.  PSYCHIATRY: No anxiety or depression.   MEDICATIONS AT HOME:  Prior to Admission medications   Medication Sig Start Date End Date Taking? Authorizing Provider  amLODipine (NORVASC) 10 MG tablet Take 10 mg by mouth daily.   Yes Historical Provider, MD  aspirin 81 MG tablet Take 81 mg by mouth daily.   Yes Historical Provider, MD  furosemide (LASIX) 40 MG tablet Take 40 mg by mouth daily.   Yes Historical Provider, MD  labetalol (NORMODYNE) 200 MG tablet Take 200 mg by mouth 2 (two) times daily.   Yes Historical Provider, MD  latanoprost (XALATAN) 0.005 % ophthalmic solution  Place 1 drop into both eyes at bedtime.   Yes Historical Provider, MD      PHYSICAL EXAMINATION:   VITAL SIGNS: Blood pressure (!) 218/105, pulse 61, temperature 98.5 F (36.9 C), temperature source Oral, resp. rate 17, height 5\' 5"  (1.651 m), weight 45.4 kg (100 lb), SpO2 100 %.  GENERAL:  65 y.o.-year-old patient lying in the bed with no acute distress.  EYES: Pupils equal, round, reactive to light and accommodation. No scleral icterus. Extraocular muscles intact.  HEENT: Head atraumatic, normocephalic. Oropharynx and  nasopharynx clear.  NECK:  Supple, no jugular venous distention. No thyroid enlargement, no tenderness.  LUNGS: Normal breath sounds bilaterally, no wheezing, Bilateral crepitation. No use of accessory muscles of respiration.  CARDIOVASCULAR: S1, S2 normal. No murmurs, rubs, or gallops.  ABDOMEN: Soft, nontender, distended. Bowel sounds present. No organomegaly or mass.  EXTREMITIES: Bilateral pedal edema, cyanosis, or clubbing.  NEUROLOGIC: Cranial nerves II through XII are intact. Muscle strength 5/5 in all extremities. Sensation intact. Gait not checked.  PSYCHIATRIC: The patient is alert and oriented x 3.  SKIN: No obvious rash, lesion, or ulcer.   LABORATORY PANEL:   CBC  Recent Labs Lab 05/31/16 1334  WBC 5.4  HGB 7.9*  HCT 23.3*  PLT 410  MCV 80.1  MCH 27.0  MCHC 33.8  RDW 20.5*   ------------------------------------------------------------------------------------------------------------------  Chemistries   Recent Labs Lab 05/31/16 1334  NA 136  K 4.4  CL 111  CO2 12*  GLUCOSE 101*  BUN 48*  CREATININE 5.99*  CALCIUM 8.6*   ------------------------------------------------------------------------------------------------------------------ estimated creatinine clearance is 6.7 mL/min (by C-G formula based on SCr of 5.99 mg/dL (H)). ------------------------------------------------------------------------------------------------------------------ No results for input(s): TSH, T4TOTAL, T3FREE, THYROIDAB in the last 72 hours.  Invalid input(s): FREET3   Coagulation profile No results for input(s): INR, PROTIME in the last 168 hours. ------------------------------------------------------------------------------------------------------------------- No results for input(s): DDIMER in the last 72 hours. -------------------------------------------------------------------------------------------------------------------  Cardiac Enzymes No results for input(s):  CKMB, TROPONINI, MYOGLOBIN in the last 168 hours.  Invalid input(s): CK ------------------------------------------------------------------------------------------------------------------ Invalid input(s): POCBNP  ---------------------------------------------------------------------------------------------------------------  Urinalysis No results found for: COLORURINE, APPEARANCEUR, LABSPEC, PHURINE, GLUCOSEU, HGBUR, BILIRUBINUR, KETONESUR, PROTEINUR, UROBILINOGEN, NITRITE, LEUKOCYTESUR   RADIOLOGY: No results found.  EKG: Orders placed or performed during the hospital encounter of 05/31/16  . ED EKG  . ED EKG    IMPRESSION AND PLAN:  * Fluid overload with the chronic renal failure stage V   Nephrologist suggested IV Lasix drip.  If does not improve she may need to go on hemodialysis and she agreed for that.  * Uncontrolled hypertension   Systolic blood pressure is more than 200 currently.   Continue amlodipine and metoprolol as home dose.   Add hydralazine oral scheduled and give IV hydralazine when necessary for high blood pressure.  * Congestive heart failure and status post pacemaker   We don't have a baseline echocardiogram, I will order here.   Patient have acute on chronic systolic congestive heart failure currently, she will be on IV Lasix.  * Chronic anemia due to chronic kidney disease   Continue monitoring.  All the records are reviewed and case discussed with ED provider. Management plans discussed with the patient, family and they are in agreement.  CODE STATUS: Full code Code Status History    This patient does not have a recorded code status. Please follow your organizational policy for patients in this situation.      Patient's son and daughter were present in the room  during my visit. TOTAL TIME TAKING CARE OF THIS PATIENT: 50 minutes.    Altamese DillingVACHHANI, Jens Siems M.D on 05/31/2016   Between 7am to 6pm - Pager - [phone]  After 6pm go to  [email or web address] - password [email or web address]  [facility]  Office  [phone]  CC: Primary care physician; Corky DownsMASOUD,JAVED, MD   Note: This dictation was prepared with Dragon dictation along with smaller phrase technology. Any transcriptional errors that result from this process are unintentional.

## 2016-05-31 NOTE — ED Provider Notes (Signed)
[facility] Emergency Department Provider Note        Time seen: ----------------------------------------- 2:37 PM on 05/31/2016 -----------------------------------------    I have reviewed the triage vital signs and the nursing notes.   HISTORY  Chief Complaint Leg Swelling    HPI Tristan SchroederCaroline J Schoeneck is a 65 y.o. female who presents to ER for swelling in her feet and ankles with abdominal distention. Patient states she's been swollen for several months and was started on Lasix about a week ago and reports doubling her Lasix today. Patient states has been some decrease in swelling she still making some urine but not very much. Patient reports getting very short of breath with activity. She has discussed needing dialysis in the past but has never consented to it.   Past Medical History:  Diagnosis Date  . Anemia   . Aneurysm (HCC)   . Chronic kidney disease   . Gastric ulcer   . Hypertension     Patient Active Problem List   Diagnosis Date Noted  . Anemia, chronic renal failure 01/31/2016  . Chronic kidney disease (CKD), stage IV (severe) (HCC) 01/01/2015  . High potassium 01/01/2015  . Acidosis, metabolic 01/01/2015  . Iron deficiency anemia 11/01/2014  . Essential (primary) hypertension 11/08/2012  . Artificial cardiac pacemaker 11/08/2012  . Congenital cystic disease of kidney 11/08/2012  . Temporary cerebral vascular dysfunction 11/08/2012  . Tobacco abuse, in remission 11/08/2012  . 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 Abnormal presence of protein in urine 09/21/2011  . ADPKD (autosomal dominant polycystic kidney disease) 06/07/2011    Past Surgical History:  Procedure Laterality Date  . ANEURYSM COILING    . COLONOSCOPY     2013  . PACEMAKER PLACEMENT      Allergies Iodine and Ivp dye [iodinated diagnostic agents]  Social History Social History  Substance Use Topics  . Smoking status: Former Smoker    Packs/day:  0.25    Years: 41.00    Types: Cigarettes  . Smokeless tobacco: Never Used  . Alcohol use No    Review of Systems Constitutional: Negative for fever. Cardiovascular: Negative for chest pain. Respiratory: Positive for dyspnea on exertion Gastrointestinal: Positive for abdominal distention, negative for vomiting and diarrhea Genitourinary: Negative for dysuria. Musculoskeletal: Positive for edema Skin: Negative for rash. Neurological: Negative for headaches, focal weakness or numbness.  10-point ROS otherwise negative.  ____________________________________________   PHYSICAL EXAM:  VITAL SIGNS: ED Triage Vitals [05/31/16 1313]  Enc Vitals Group     BP (!) 185/98     Pulse Rate 74     Resp      Temp 98.5 F (36.9 C)     Temp Source Oral     SpO2 100 %     Weight 100 lb (45.4 kg)     Height 5\' 5"  (1.651 m)     Head Circumference      Peak Flow      Pain Score 8     Pain Loc      Pain Edu?      Excl. in GC?     Constitutional: Alert and oriented. No acute distress Eyes: Conjunctivae are normal. PERRL. Normal extraocular movements. ENT   Head: Normocephalic and atraumatic.   Nose: No congestion/rhinnorhea.   Mouth/Throat: Mucous membranes are moist.   Neck: No stridor. Cardiovascular: Normal rate, regular rhythm. No murmurs, rubs, or gallops. Respiratory: Normal respiratory effort without tachypnea nor retractions. Breath sounds are clear and equal bilaterally.  No wheezes/rales/rhonchi. Gastrointestinal: Distended, nontender. Musculoskeletal: Nontender with normal range of motion in all extremities. Bilateral pitting edema Neurologic:  Normal speech and language. No gross focal neurologic deficits are appreciated.  Skin:  Skin is warm, dry and intact. No rash noted. Psychiatric: Mood and affect are normal. Speech and behavior are normal.  ____________________________________________  EKG: Interpreted by me. Sinus rhythm with short PR, normal QRS size,  normal QT, septal infarct age indeterminate  ____________________________________________  ED COURSE:  Pertinent labs & imaging results that were available during my care of the patient were reviewed by me and considered in my medical decision making (see chart for details). Clinical Course   Patient presents to ER with edema. We will assess with labs and reevaluate.  Procedures ____________________________________________   LABS (pertinent positives/negatives)  Labs Reviewed  BASIC METABOLIC PANEL - Abnormal; Notable for the following:       Result Value   CO2 12 (*)    Glucose, Bld 101 (*)    BUN 48 (*)    Creatinine, Ser 5.99 (*)    Calcium 8.6 (*)    GFR calc non Af Amer 7 (*)    GFR calc Af Amer 8 (*)    All other components within normal limits  CBC - Abnormal; Notable for the following:    RBC 2.90 (*)    Hemoglobin 7.9 (*)    HCT 23.3 (*)    RDW 20.5 (*)    All other components within normal limits  BRAIN NATRIURETIC PEPTIDE - Abnormal; Notable for the following:    B Natriuretic Peptide 279.0 (*)    All other components within normal limits  URINALYSIS COMPLETEWITH MICROSCOPIC ([facility] ONLY)   ___________________________________________  FINAL ASSESSMENT AND PLAN  Edema, chronic kidney disease, chronic anemia  Plan: Patient with labs and imaging as dictated above. Patient with findings of worsening kidney function and likely edema secondary to same. I will discussed with nephrology, likely start her on Lasix drip and patient has consented to dialysis if she needs it. I will discuss with the hospitalist for admission.   Emily FilbertWilliams, Loukas Antonson E, MD   Note: This dictation was prepared with Dragon dictation. Any transcriptional errors that result from this process are unintentional    Emily FilbertJonathan E Casen Pryor, MD 05/31/16 1440

## 2016-06-01 ENCOUNTER — Inpatient Hospital Stay
Admit: 2016-06-01 | Discharge: 2016-06-01 | Disposition: A | Discharge status: Home / Self Care | Payer: Medicare Other | Attending: Internal Medicine | Admitting: Internal Medicine

## 2016-06-01 DIAGNOSIS — R0602 Shortness of breath: Secondary | ICD-10-CM

## 2016-06-01 DIAGNOSIS — R600 Localized edema: Secondary | ICD-10-CM

## 2016-06-01 DIAGNOSIS — N189 Chronic kidney disease, unspecified: Secondary | ICD-10-CM

## 2016-06-01 DIAGNOSIS — E43 Unspecified severe protein-calorie malnutrition: Secondary | ICD-10-CM | POA: Diagnosis present

## 2016-06-01 DIAGNOSIS — I509 Heart failure, unspecified: Secondary | ICD-10-CM

## 2016-06-01 LAB — CBC
HCT: 20.7 % — ABNORMAL LOW (ref 35.0–47.0)
HEMOGLOBIN: 7 g/dL — AB (ref 12.0–16.0)
MCH: 26.9 pg (ref 26.0–34.0)
MCHC: 33.9 g/dL (ref 32.0–36.0)
MCV: 79.3 fL — ABNORMAL LOW (ref 80.0–100.0)
PLATELETS: 332 10*3/uL (ref 150–440)
RBC: 2.61 MIL/uL — ABNORMAL LOW (ref 3.80–5.20)
RDW: 20.4 % — AB (ref 11.5–14.5)
WBC: 4.1 10*3/uL (ref 3.6–11.0)

## 2016-06-01 LAB — BASIC METABOLIC PANEL
ANION GAP: 11 (ref 5–15)
BUN: 50 mg/dL — ABNORMAL HIGH (ref 6–20)
CALCIUM: 8.4 mg/dL — AB (ref 8.9–10.3)
CO2: 12 mmol/L — ABNORMAL LOW (ref 22–32)
Chloride: 115 mmol/L — ABNORMAL HIGH (ref 101–111)
Creatinine, Ser: 6.05 mg/dL — ABNORMAL HIGH (ref 0.44–1.00)
GFR calc Af Amer: 8 mL/min — ABNORMAL LOW (ref >60)
GFR, EST NON AFRICAN AMERICAN: 7 mL/min — AB (ref >60)
GLUCOSE: 104 mg/dL — AB (ref 65–99)
Potassium: 4.5 mmol/L (ref 3.5–5.1)
Sodium: 138 mmol/L (ref 135–145)

## 2016-06-01 LAB — ECHOCARDIOGRAM COMPLETE
HEIGHTINCHES: 65 in
Weight: 1600 oz

## 2016-06-01 MED ORDER — PANTOPRAZOLE SODIUM 40 MG PO TBEC
40.0000 mg | DELAYED_RELEASE_TABLET | Freq: Every day | ORAL | Status: DC
  Administered 2016-06-01 – 2016-06-07 (×7): 40 mg via ORAL
  Filled 2016-06-01 (×8): qty 1

## 2016-06-01 MED ORDER — ACETAMINOPHEN 325 MG PO TABS
650.0000 mg | ORAL_TABLET | Freq: Four times a day (QID) | ORAL | Status: DC | PRN
  Administered 2016-06-01 – 2016-06-02 (×2): 650 mg via ORAL
  Filled 2016-06-01 (×3): qty 2

## 2016-06-01 MED ORDER — ALUM & MAG HYDROXIDE-SIMETH 200-200-20 MG/5ML PO SUSP
30.0000 mL | Freq: Four times a day (QID) | ORAL | Status: DC | PRN
  Administered 2016-06-01 – 2016-06-02 (×3): 30 mL via ORAL
  Filled 2016-06-01 (×3): qty 30

## 2016-06-01 MED ORDER — TUBERCULIN PPD 5 UNIT/0.1ML ID SOLN
5.0000 [IU] | Freq: Once | INTRADERMAL | Status: AC
  Administered 2016-06-01: 5 [IU] via INTRADERMAL
  Filled 2016-06-01: qty 0.1

## 2016-06-01 NOTE — Progress Notes (Signed)
*  PRELIMINARY RESULTS* Echocardiogram 2D Echocardiogram has been performed.  Cristela BlueHege, Camisha Srey 06/01/2016, 11:29 AM

## 2016-06-01 NOTE — Progress Notes (Signed)
[facility] [place] [facility]   PATIENT NAME: Melissa PortelaCaroline Lamping    MR#:  [id number]  DATE OF BIRTH:  07/20/1950  SUBJECTIVE:  CHIEF COMPLAINT:   Chief Complaint  Patient presents with  . Leg Swelling   The patient is 65 year old African-American female with medical history significant for history of CK D, diabetes mellitus, CHF, hypertension, status post pacemaker placement, brain aneurysm status post coiling, who presents to the hospital with worsening swelling in lower extremities as well as abdomen, shortness of breath with minimal activity going on for the past 4-5 months. She was recently started on high doses of oral Lasix, with some improvement. However, due to hypoxia, she was sent to emergency room for further evaluation and treatment. Patient was initiated on Lasix IV drip and improved, less short of breath, less abdominal swelling, distention, less lower extremity swelling. Review of Systems  Constitutional: Negative for chills, fever and weight loss.  HENT: Negative for congestion.   Eyes: Negative for blurred vision and double vision.  Respiratory: Positive for shortness of breath. Negative for cough, sputum production and wheezing.   Cardiovascular: Positive for orthopnea and leg swelling. Negative for chest pain, palpitations and PND.  Gastrointestinal: Positive for abdominal pain. Negative for blood in stool, constipation, diarrhea, nausea and vomiting.  Genitourinary: Negative for dysuria, frequency, hematuria and urgency.  Musculoskeletal: Negative for falls.  Neurological: Negative for dizziness, tremors, focal weakness and headaches.  Endo/Heme/Allergies: Does not bruise/bleed easily.  Psychiatric/Behavioral: Negative for depression. The patient does not have insomnia.     VITAL SIGNS: Blood pressure 118/64, pulse 66, temperature 97.9 F (36.6 C), temperature source Oral, resp. rate 18, height 5\' 5"  (1.651 m), weight 45.4 kg (100 lb), SpO2  100 %.  PHYSICAL EXAMINATION:   GENERAL:  65 y.o.-year-old patient lying in the bed in mild respiratory distress, somewhat tachypneic, mildly uncomfortable, especially on exertion.  EYES: Pupils equal, round, reactive to light and accommodation. No scleral icterus. Extraocular muscles intact.  HEENT: Head atraumatic, normocephalic. Oropharynx and nasopharynx clear.  NECK:  Supple, no jugular venous distention. No thyroid enlargement, no tenderness.  LUNGS: Diminished breath sounds bilaterally at bases, no wheezing, rales,rhonchi or crepitation. No use of accessory muscles of respiration.  CARDIOVASCULAR: S1, S2 normal. No murmurs, rubs, or gallops.  ABDOMEN: Soft, nontender, mildly distended, ascitic fluid wave on percussion. Bowel sounds present. No organomegaly or mass.  EXTREMITIES: 2+ lower extremity and pedal edema, no cyanosis, or clubbing.  NEUROLOGIC: Cranial nerves II through XII are intact. Muscle strength 5/5 in all extremities. Sensation intact. Gait not checked.  PSYCHIATRIC: The patient is alert and oriented x 3.  SKIN: No obvious rash, lesion, or ulcer.   ORDERS/RESULTS REVIEWED:   CBC  Recent Labs Lab 05/31/16 1334 06/01/16 0542  WBC 5.4 4.1  HGB 7.9* 7.0*  HCT 23.3* 20.7*  PLT 410 332  MCV 80.1 79.3*  MCH 27.0 26.9  MCHC 33.8 33.9  RDW 20.5* 20.4*   ------------------------------------------------------------------------------------------------------------------  Chemistries   Recent Labs Lab 05/31/16 1334 06/01/16 0542  NA 136 138  K 4.4 4.5  CL 111 115*  CO2 12* 12*  GLUCOSE 101* 104*  BUN 48* 50*  CREATININE 5.99* 6.05*  CALCIUM 8.6* 8.4*   ------------------------------------------------------------------------------------------------------------------ estimated creatinine clearance is 6.6 mL/min (by C-G formula based on SCr of 6.05 mg/dL  (H)). ------------------------------------------------------------------------------------------------------------------ No results for input(s): TSH, T4TOTAL, T3FREE, THYROIDAB in the last 72 hours.  Invalid input(s): FREET3  Cardiac Enzymes No results for input(s):  CKMB, TROPONINI, MYOGLOBIN in the last 168 hours.  Invalid input(s): CK ------------------------------------------------------------------------------------------------------------------ Invalid input(s): POCBNP ---------------------------------------------------------------------------------------------------------------  RADIOLOGY: No results found.  EKG:  Orders placed or performed during the hospital encounter of 05/31/16  . ED EKG  . ED EKG    ASSESSMENT AND PLAN:  Principal Problem:   Fluid overload Active Problems:   Protein-calorie malnutrition, severe  #1 fluid overload due to worsening renal failure, continue Lasix intravenously, patient will have PermCath placed today by vascular surgery, initiated on dialysis. She wants to start dialysis at home, but was told that it could be done only when ascites is gone, she voiced understanding. Echocardiogram revealed normal cardiac function #2.  end-stage renal disease due to polycystic kidney disease, PermCath will be placed, hemodialysis will be initiated tomorrow, appreciate nephrology's input. Long-term patient would like to have peritoneal dialysis done at home, but was told of ascites has to resolve first. #3 anemia of CKD, transfuse patient during dialysis tomorrow, recheck hemoglobin level tomorrow morning #4. Failure to thrive adult, follow oral intake 5. Generalized weakness, physical therapist evaluation will be obtained, patient's family and patient herself would like to go to rehabilitation facility, if possible   Management plans discussed with the patient, family and they are in agreement.   DRUG ALLERGIES:  Allergies  Allergen Reactions  .  Iodine Other (See Comments)    Unknown reaction  . Ivp Dye [Iodinated Diagnostic Agents] Rash    CODE STATUS:     Code Status Orders        Start     Ordered   05/31/16 2102  Full code  Continuous     05/31/16 2101    Code Status History    Date Active Date Inactive Code Status Order ID Comments User Context   This patient has a current code status but no historical code status.    Advance Directive Documentation   Flowsheet Row Most Recent Value  Type of Advance Directive  Healthcare Power of Attorney  Pre-existing out of facility DNR order (yellow form or pink MOST form)  No data  "MOST" Form in Place?  No data      TOTAL TIME TAKING CARE OF THIS PATIENT: 40 minutes.  Discussed with multiple family members, all questions were answered, they voiced understanding  Sophonie Goforth M.D on 06/01/2016 at 3:40 PM  Between 7am to 6pm - Pager - [phone]  After 6pm go to [email or web address] - password [email or web address] [facility]  [facility]  Office  [phone]  CC: Primary care physician; Corky DownsMASOUD,JAVED, MD

## 2016-06-01 NOTE — [place] FL2 (Signed)
[place] MEDICAID FL2 LEVEL OF CARE SCREENING TOOL     IDENTIFICATION  Patient Name: Melissa Bradshaw Birthdate: 08/08/1950 Sex: female Admission Date (Current Location): 05/31/2016  [place]   Facility and Address:  [facility], [address], [place], [place] [address]      Provider Number: [id number]  Attending Physician Name and Address:  Katharina Caperima Vaickute, MD  Relative Name and Phone Number:       Current Level of Care: Hospital Recommended Level of Care: Skilled Nursing Facility Prior Approval Number:    Date Approved/Denied:   PASRR Number: [id number]  Discharge Plan: SNF    Current Diagnoses: Patient Active Problem List   Diagnosis Date Noted  . Fluid overload 05/31/2016  . Anemia, chronic renal failure 01/31/2016  . Chronic kidney disease (CKD), stage IV (severe) (HCC) 01/01/2015  . High potassium 01/01/2015  . Acidosis, metabolic 01/01/2015  . Iron deficiency anemia 11/01/2014  . Essential (primary) hypertension 11/08/2012  . Artificial cardiac pacemaker 11/08/2012  . Congenital cystic disease of kidney 11/08/2012  . Temporary cerebral vascular dysfunction 11/08/2012  . Tobacco abuse, in remission 11/08/2012  . 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 Abnormal presence of protein in urine 09/21/2011  . ADPKD (autosomal dominant polycystic kidney disease) 06/07/2011    Orientation RESPIRATION BLADDER Height & Weight     Self, Time, Situation, Place  Normal Continent Weight: 100 lb (45.4 kg) Height:  5\' 5"  (165.1 cm)  BEHAVIORAL SYMPTOMS/MOOD NEUROLOGICAL BOWEL NUTRITION STATUS   (none)  (none) Continent Diet (renal)  AMBULATORY STATUS COMMUNICATION OF NEEDS Skin   Limited Assist Verbally Normal                       Personal Care Assistance Level of Assistance  Bathing, Dressing Bathing Assistance: Limited assistance   Dressing Assistance: Limited assistance     Functional  Limitations Info             SPECIAL CARE FACTORS FREQUENCY  PT (By licensed PT)                    Contractures Contractures Info: Not present    Additional Factors Info  Code Status, Allergies Code Status Info: full Allergies Info: iodine, ivp dye           Current Medications (06/01/2016):  This is the current hospital active medication list Current Facility-Administered Medications  Medication Dose Route Frequency Provider Last Rate Last Dose  . alum & mag hydroxide-simeth (MAALOX/MYLANTA) 200-200-20 MG/5ML suspension 30 mL  30 mL Oral Q6H PRN Katharina Caperima Vaickute, MD   30 mL at 06/01/16 1152  . amLODipine (NORVASC) tablet 10 mg  10 mg Oral Daily Altamese DillingVaibhavkumar Vachhani, MD   10 mg at 06/01/16 1044  . aspirin EC tablet 81 mg  81 mg Oral Daily Altamese DillingVaibhavkumar Vachhani, MD   81 mg at 06/01/16 1044  . furosemide (LASIX) 250 mg in dextrose 5 % 250 mL (1 mg/mL) infusion  5 mg/hr Intravenous Continuous Emily FilbertJonathan E Williams, MD 5 mL/hr at 05/31/16 1703 5 mg/hr at 05/31/16 1703  . heparin injection 5,000 Units  5,000 Units Subcutaneous Q8H Altamese DillingVaibhavkumar Vachhani, MD   5,000 Units at 06/01/16 0545  . hydrALAZINE (APRESOLINE) injection 10 mg  10 mg Intravenous Q6H PRN Altamese DillingVaibhavkumar Vachhani, MD   10 mg at 05/31/16 1538  . hydrALAZINE (APRESOLINE) tablet 25 mg  25 mg Oral Q8H Altamese DillingVaibhavkumar Vachhani, MD   25  mg at 06/01/16 0544  . Influenza vac split quadrivalent PF (FLUARIX) injection 0.5 mL  0.5 mL Intramuscular Tomorrow-1000 Altamese DillingVaibhavkumar Vachhani, MD      . labetalol (NORMODYNE) tablet 200 mg  200 mg Oral BID Altamese DillingVaibhavkumar Vachhani, MD   200 mg at 06/01/16 1044  . latanoprost (XALATAN) 0.005 % ophthalmic solution 1 drop  1 drop Both Eyes QHS Altamese DillingVaibhavkumar Vachhani, MD   1 drop at 05/31/16 2150  . ondansetron (ZOFRAN) injection 4 mg  4 mg Intravenous Q6H Altamese DillingVaibhavkumar Vachhani, MD   4 mg at 06/01/16 1156  . pantoprazole (PROTONIX) EC tablet 40 mg  40 mg Oral Daily Ihor AustinPavan Pyreddy, MD   40 mg at  06/01/16 0738  . pneumococcal 23 valent vaccine (PNU-IMMUNE) injection 0.5 mL  0.5 mL Intramuscular Tomorrow-1000 Altamese DillingVaibhavkumar Vachhani, MD      . tuberculin injection 5 Units  5 Units Intradermal Once Munsoor Lateef, MD         Discharge Medications: Please see discharge summary for a list of discharge medications.  Relevant Imaging Results:  Relevant Lab Results:   Additional Information ss: [id number]  York SpanielMonica Audry Pecina, LCSW

## 2016-06-01 NOTE — Clinical Social Work Note (Signed)
Clinical Social Work Assessment  Patient Details  Name: Melissa Bradshaw MRN: [id number] Date of Birth: 05/24/1951  Date of referral:  06/01/16               Reason for consult:  Facility Placement                Permission sought to share information with:    Permission granted to share information::     Name::        Agency::     Relationship::     Contact Information:     Housing/Transportation Living arrangements for the past 2 months:  Single Family Home Source of Information:  Adult Children Patient Interpreter Needed:  None Criminal Activity/Legal Involvement Pertinent to Current Situation/Hospitalization:  No - Comment as needed Significant Relationships:  Adult Children Lives with:  Self Do you feel safe going back to the place where you live?    Need for family participation in patient care:     Care giving concerns:  Patient resides alone and has been deconditioned due to illness.   Social Worker assessment / plan:  CSW informed by MD that patient stated she thinks she will need to go to rehab. MD has ordered PT to evaluate. CSW went to speak with patient but found patient soundly sleeping and patient's daughter at bedside: Melissa Bradshaw: [phone]. CSW explained role and purpose of visit and Ms. Melissa Bradshaw stated that they all had discussed the potential need for rehab this morning and that patient is in agreement with this. CSW explained the process and will follow up with patient and daughter.  Employment status:  Retired Database administratornsurance information:  Managed Medicare PT Recommendations:    Information / Referral to community resources:     Patient/Family's Response to care:  Patient's daughter expressed appreciation for CSW assistance.  Patient/Family's Understanding of and Emotional Response to Diagnosis, Current Treatment, and Prognosis:  Patient's daughter is aware that patient is deconditioned and is in agreement with rehab if needed.  Emotional  Assessment Appearance:  Appears stated age Attitude/Demeanor/Rapport:   (patient soundly sleeping) Affect (typically observed):   (patient soundly sleeping) Orientation:  Oriented to Self, Oriented to Place, Oriented to  Time, Oriented to Situation (as reported by nursing today) Alcohol / Substance use:  Not Applicable Psych involvement (Current and /or in the community):  No (Comment)  Discharge Needs  Concerns to be addressed:  Care Coordination Readmission within the last 30 days:  No Current discharge risk:  None Barriers to Discharge:  No Barriers Identified   Melissa SpanielMonica Angella Montas, LCSW 06/01/2016, 2:46 PM

## 2016-06-01 NOTE — Evaluation (Addendum)
Physical Therapy Evaluation Patient Details Name: Melissa SchroederCaroline J Bradshaw MRN: [id number] DOB: 01/18/1951 Today's Date: 06/01/2016   History of Present Illness  presented to ER secondary to worsening LE, abdominal edema with progressive weakness x4-5 days; admitted with fluid overload.  Plan to place permcath 11/30 and initiate dialysis once completed.  Clinical Impression  Upon evaluation, patient alert and oriented to basic information; follows all commands, but demonstrates limited insight into deficits and overall health condition.  Bilat UE/LEs globally weak and deconditioned; unable to tolerate activity beyond bed/chair at this time.  Does require min assist +1 for all functional mobility with noted deficits in standing balance, activity tolerance and overall functional mobility.  Unable to demonstrate ability to safely return home at this time; will continue to monitor as medical status improves. Would benefit from skilled PT to address above deficits and promote optimal return to PLOF; recommend transition to STR upon discharge from acute hospitalization.     Follow Up Recommendations STR    Equipment Recommendations  Rolling walker with 5" wheels    Recommendations for Other Services       Precautions / Restrictions Precautions Precautions: Fall Restrictions Weight Bearing Restrictions: No      Mobility  Bed Mobility Overal bed mobility: Needs Assistance Bed Mobility: Supine to Sit;Sit to Supine     Supine to sit: Min assist;Mod assist Sit to supine: Min assist;Mod assist      Transfers Overall transfer level: Needs assistance Equipment used: 1 person hand held assist Transfers: Sit to/from Stand Sit to Stand: Min assist            Ambulation/Gait Ambulation/Gait assistance: Min assist Ambulation Distance (Feet): 5 Feet Assistive device: 1 person hand held assist       General Gait Details: flat foot contact, limited ability to shift weight outside immediate  BOS; unable to tolerate additional distance due to fatigue/abdominal pain/weakness  Stairs            Wheelchair Mobility    Modified Rankin (Stroke Patients Only)       Balance Overall balance assessment: Needs assistance Sitting-balance support: No upper extremity supported;Feet supported Sitting balance-Leahy Scale: Good     Standing balance support: No upper extremity supported Standing balance-Leahy Scale: Fair                               Pertinent Vitals/Pain Pain Assessment: Faces Faces Pain Scale: Hurts whole lot Pain Location: abdomen Pain Descriptors / Indicators: Tightness;Aching Pain Intervention(s): Limited activity within patient's tolerance;Monitored during session;Patient requesting pain meds-RN notified;Repositioned    Home Living Family/patient expects to be discharged to:: Private residence Living Arrangements: Children Available Help at Discharge: Family Type of Home: House Home Access: Stairs to enter Entrance Stairs-Rails: Right Entrance Stairs-Number of Steps: 3 Home Layout: One level        Prior Function Level of Independence: Independent         Comments: Indep without use of assist device for ADLs, household and community mobility.  Adult son lives with patient (patient caregiver?)     Hand Dominance        Extremity/Trunk Assessment   Upper Extremity Assessment: Generalized weakness           Lower Extremity Assessment: Generalized weakness (grossly 3-/5 throughout; globally weak and deconditioned)         Communication      Cognition Arousal/Alertness: Awake/alert Behavior During Therapy: WFL for tasks assessed/performed  Overall Cognitive Status: Within Functional Limits for tasks assessed                      General Comments      Exercises     Assessment/Plan    PT Assessment Patient needs continued PT services  PT Problem List Decreased strength;Decreased activity  tolerance;Decreased balance;Decreased mobility;Decreased knowledge of use of DME;Decreased safety awareness;Decreased knowledge of precautions;Pain          PT Treatment Interventions DME instruction;Gait training;Stair training;Functional mobility training;Therapeutic activities;Therapeutic exercise;Balance training;Patient/family education    PT Goals (Current goals can be found in the Care Plan section)  Acute Rehab PT Goals Patient Stated Goal: to try to get up a little bit PT Goal Formulation: With patient/family Time For Goal Achievement: 06/15/16 Potential to Achieve Goals: Fair    Frequency Min 2X/week   Barriers to discharge Decreased caregiver support      Co-evaluation               End of Session Equipment Utilized During Treatment: Gait belt Activity Tolerance: Patient limited by fatigue;Patient limited by pain Patient left: in chair;with call bell/phone within reach;with family/visitor present;with chair alarm set           Time: [id number] PT Time Calculation (min) (ACUTE ONLY): 15 min   Charges:   PT Evaluation $PT Eval Moderate Complexity: 1 Procedure     PT G Codes:        Melissa Bradshaw, PT, DPT, NCS 06/01/16, 1:57 PM [phone]  Addendum: incorrect discharge disposition initially clicked; correct to reflect current recommendations (STR) on 06/03/16.  Team informed/aware of corrections. Melissa Bradshaw, PT, DPT, NCS 06/03/16, 3:41 PM [phone]

## 2016-06-01 NOTE — Progress Notes (Signed)
Called Dr. Tobi BastosPyreddy per patient request for medication to relieve burning in stomach.  Appropriate orders were placed.  Arturo MortonClay, Drue Harr N  06/01/2016  6:51 AM

## 2016-06-01 NOTE — Progress Notes (Addendum)
Initial Nutrition Assessment  DOCUMENTATION CODES:   Severe malnutrition in context of chronic illness, Underweight  INTERVENTION:  -Pt agreeable to [employer] supplement (does not like Ensure or Boost); will send TID between meals -Pt would benefit from smaller, more frequent meals type pattern -Recommend liberalizing diet until appetite and po intake improve -Plan to to provide further dialysis diet education on follow-up -Recommend daily weights  NUTRITION DIAGNOSIS:   Malnutrition related to chronic illness as evidenced by severe depletion of muscle mass, severe depletion of body fat, severe fluid accumulation, energy intake < or equal to 75% for > or equal to 1 month.  GOAL:   Patient will meet greater than or equal to 90% of their needs  MONITOR:   PO intake, Supplement acceptance, Labs, Weight trends, I & O's  REASON FOR ASSESSMENT:   Malnutrition Screening Tool    ASSESSMENT:    65 yo female admitted with fluid overload with CKD V not yet on dialysis, CHF, uncontrolled HTN   Noted plan for perm cath placement and initiation of dialysis tomorrow. UOP 900 mL documented since admission, pt currently on lasix drip  Pt reports very poor appetite and po intake for several months with 20 pound wt loss in 6 months 16.7% wt loss based on current recorded wt. Pt reports eating bites and sips of 1 meal daily for a while. Pt does not use nutritional supplements such as Boost/Ensure as she does not like the taste. Pt only ordered applesauce for breakfast this AM and told diet office she only wanted gingerale for lunch. Pt reports her UBW was 135-140 pounds. Pt with significant ascites, likely contributing to her early satiety and poor appetite and may be masking some of her wt loss as well.   Nutrition-Focused physical exam completed. Findings are severe fat depletion, severe muscle depletion, and severe edema.   Labs: BUN 50, Creatinine 6.05, potassium wdl, no phosohorus Meds:  lasix drip   Diet Order:  Diet renal with fluid restriction Fluid restriction: 1200 mL Fluid; Room service appropriate? Yes; Fluid consistency: Thin  Skin:  Reviewed, no issues Last BM:  11/27  Height:   Ht Readings from Last 1 Encounters:  05/31/16 5\' 5"  (1.651 m)    Weight:   Wt Readings from Last 1 Encounters:  05/31/16 100 lb (45.4 kg)   BMI:  Body mass index is 16.64 kg/m.  Estimated Nutritional Needs:   Kcal:  1400-1700 kcals  Protein:  54-68 g  Fluid:  1000 mL plus UOP  EDUCATION NEEDS:   Education needs addressed  Melissa Starcherate Dayna Geurts MS, RD, LDN [phone] Pager  [phone] Weekend/On-Call Pager

## 2016-06-01 NOTE — Care Management (Signed)
EKG faxed to Susann Givensheryl Brawner

## 2016-06-01 NOTE — Progress Notes (Signed)
Called Dr. Emmit PomfretHugelmeyer regarding patient request for pain medication.  Doctor will look at chart and put in appropriate orders.  Lennon AlstromClay, Shantrell Placzek N 06/01/2016  12:04 AM

## 2016-06-01 NOTE — Progress Notes (Signed)
[facility]  ROUNDING NOTE   Subjective:  Patient well known to [place] from the office. We last saw her on 01/18/2016 at which point in time creatinine was 5.72 with an EGFR of 8. She had been advised previously that she needed to start renal replacement therapy. Unfortunately she was lost to follow-up thereafter. We attempted to contact her multiple times after her last missed appointment. She now presents with significant ascites, shortness of breath, poor appetite, and dysgeusia. She is clearly in need of renal replacement therapy at this time.   Objective:  Vital signs in last 24 hours:  Temp:  [97.7 F (36.5 C)-98.5 F (36.9 C)] 97.7 F (36.5 C) (11/29 0752) Pulse Rate:  [56-96] 62 (11/29 0752) Resp:  [17-28] 20 (11/29 0752) BP: (114-218)/(65-109) 127/65 (11/29 0752) SpO2:  [98 %-100 %] 100 % (11/29 0752) Weight:  [45.4 kg (100 lb)] 45.4 kg (100 lb) (11/28 1313)  Weight change:  Filed Weights   05/31/16 1313  Weight: 45.4 kg (100 lb)    Intake/Output: I/O last 3 completed shifts: In: 360 [P.O.:360] Out: 650 [Urine:650]   Intake/Output this shift:  Total I/O In: -  Out: 250 [Urine:250]  Physical Exam: General: No acute distress  Head: Normocephalic, atraumatic. Moist oral mucosal membranes  Eyes: Anicteric  Neck: Supple, trachea midline  Lungs:  Basilar rales, normal effort  Heart: S1S2 no rubs  Abdomen:  Distended, bowel sounds present  Extremities: trace peripheral edema.  Neurologic: Nonfocal, moving all four extremities  Skin: No lesions  Access: none    Basic Metabolic Panel:  Recent Labs Lab 05/31/16 1334 06/01/16 0542  NA 136 138  K 4.4 4.5  CL 111 115*  CO2 12* 12*  GLUCOSE 101* 104*  BUN 48* 50*  CREATININE 5.99* 6.05*  CALCIUM 8.6* 8.4*    Liver Function Tests: No results for input(s): AST, ALT, ALKPHOS, BILITOT, PROT, ALBUMIN in the last 168 hours. No results for input(s): LIPASE, AMYLASE in the last 168 hours. No  results for input(s): AMMONIA in the last 168 hours.  CBC:  Recent Labs Lab 05/31/16 1334 06/01/16 0542  WBC 5.4 4.1  HGB 7.9* 7.0*  HCT 23.3* 20.7*  MCV 80.1 79.3*  PLT 410 332    Cardiac Enzymes: No results for input(s): CKTOTAL, CKMB, CKMBINDEX, TROPONINI in the last 168 hours.  BNP: Invalid input(s): POCBNP  CBG: No results for input(s): GLUCAP in the last 168 hours.  Microbiology: No results found for this or any previous visit.  Coagulation Studies: No results for input(s): LABPROT, INR in the last 72 hours.  Urinalysis:  Recent Labs  05/31/16 1831  COLORURINE YELLOW*  LABSPEC 1.010  PHURINE 5.0  GLUCOSEU NEGATIVE  HGBUR 1+*  BILIRUBINUR NEGATIVE  KETONESUR NEGATIVE  PROTEINUR 100*  NITRITE NEGATIVE  LEUKOCYTESUR NEGATIVE      Imaging: No results found.   Medications:   . furosemide (LASIX) infusion 5 mg/hr (05/31/16 1703)   . amLODipine  10 mg Oral Daily  . aspirin EC  81 mg Oral Daily  . heparin  5,000 Units Subcutaneous Q8H  . hydrALAZINE  25 mg Oral Q8H  . Influenza vac split quadrivalent PF  0.5 mL Intramuscular Tomorrow-1000  . labetalol  200 mg Oral BID  . latanoprost  1 drop Both Eyes QHS  . ondansetron (ZOFRAN) IV  4 mg Intravenous Q6H  . pantoprazole  40 mg Oral Daily  . pneumococcal 23 valent vaccine  0.5 mL Intramuscular Tomorrow-1000   hydrALAZINE  Assessment/ Plan:  65 y.o. African American female with past medical history of chronic kidney disease stage V, anemia chronic kidney disease, hypertension, secondary hyperparathyroidism, history of gastric ulcer, history of pacemaker placement, polycystic kidney disease, history of TIA, vertebral artery aneurysm status post coiling, history of hyperkalemia, and metabolic acidosis   1. End-stage renal disease secondary to polycystic kidney disease: The patient is clearly in need of renal replacement therapy at this time. She has needed renal placement therapy since at least July of  this past year. Patient has been lost to follow-up since July. We attempted to contact her multiple times thereafter. She has ongoing nausea, poor appetite, discrasia, ascites, and shortness of breath. Dialysis is indicated at this point in time and she is agreeable to proceeding with hemodialysis for now. Long-term she would like to perform peritoneal dialysis if possible however we will need to see if her ascites resolve first. We will request PermCath be placed tomorrow and we will start her first dialysis treatment tomorrow.  She requests placement at [facility].   2. Metabolic acidosis. Serum bicarbonate quite low at 12. This should be readily treated with initiation of dialysis.  3. Anemia chronic kidney disease. Hemoglobin quite low at 7.0. Consider blood transfusion tomorrow with dialysis.  4. Secondary hyperparathyroidism. We will need to more fully evaluate her bone mineral metabolism parameters now. Further plan menses labs are available.  5. Thanks for consultation. We will follow closely with you.   LOS: 1 Oneika Simonian 11/29/201711:21 AM

## 2016-06-02 ENCOUNTER — Encounter
Admission: EM | Disposition: A | Discharge status: Home / Self Care | Payer: Self-pay | Source: Home / Self Care | Attending: Internal Medicine

## 2016-06-02 DIAGNOSIS — N189 Chronic kidney disease, unspecified: Secondary | ICD-10-CM

## 2016-06-02 HISTORY — PX: PERIPHERAL VASCULAR CATHETERIZATION: SHX172C

## 2016-06-02 LAB — HEMOGLOBIN: Hemoglobin: 6.5 g/dL — ABNORMAL LOW (ref 12.0–16.0)

## 2016-06-02 LAB — PREPARE RBC (CROSSMATCH)

## 2016-06-02 LAB — ABO/RH: ABO/RH(D): B POS

## 2016-06-02 SURGERY — DIALYSIS/PERMA CATHETER INSERTION
Anesthesia: Moderate Sedation

## 2016-06-02 MED ORDER — FAMOTIDINE 20 MG PO TABS
40.0000 mg | ORAL_TABLET | Freq: Once | ORAL | Status: AC
  Administered 2016-06-02: 40 mg via ORAL

## 2016-06-02 MED ORDER — HEPARIN (PORCINE) IN NACL 2-0.9 UNIT/ML-% IJ SOLN
INTRAMUSCULAR | Status: AC
Start: 2016-06-02 — End: 2016-06-02
  Filled 2016-06-02: qty 500

## 2016-06-02 MED ORDER — FENTANYL CITRATE (PF) 100 MCG/2ML IJ SOLN
INTRAMUSCULAR | Status: DC | PRN
  Administered 2016-06-02: 25 ug via INTRAVENOUS
  Administered 2016-06-02: 50 ug via INTRAVENOUS

## 2016-06-02 MED ORDER — FAMOTIDINE 20 MG PO TABS
ORAL_TABLET | ORAL | Status: AC
Start: 2016-06-02 — End: 2016-06-02
  Administered 2016-06-02: 40 mg via ORAL
  Filled 2016-06-02: qty 2

## 2016-06-02 MED ORDER — METHYLPREDNISOLONE SODIUM SUCC 125 MG IJ SOLR
INTRAMUSCULAR | Status: AC
  Administered 2016-06-02: 125 mg via INTRAVENOUS
  Filled 2016-06-02: qty 2

## 2016-06-02 MED ORDER — LABETALOL HCL 200 MG PO TABS
200.0000 mg | ORAL_TABLET | Freq: Every morning | ORAL | Status: DC
  Administered 2016-06-03 – 2016-06-07 (×4): 200 mg via ORAL
  Filled 2016-06-02 (×4): qty 1

## 2016-06-02 MED ORDER — FENTANYL CITRATE (PF) 100 MCG/2ML IJ SOLN
INTRAMUSCULAR | Status: AC
  Filled 2016-06-02: qty 2

## 2016-06-02 MED ORDER — HEPARIN SODIUM (PORCINE) 10000 UNIT/ML IJ SOLN
INTRAMUSCULAR | Status: AC
  Filled 2016-06-02: qty 1

## 2016-06-02 MED ORDER — DEXTROSE 5 % IV SOLN
1.5000 g | Freq: Once | INTRAVENOUS | Status: AC
  Administered 2016-06-02: 1.5 g via INTRAVENOUS

## 2016-06-02 MED ORDER — MIDAZOLAM HCL 2 MG/2ML IJ SOLN
INTRAMUSCULAR | Status: AC
  Filled 2016-06-02: qty 4

## 2016-06-02 MED ORDER — HEPARIN SODIUM (PORCINE) 1000 UNIT/ML IJ SOLN
INTRAMUSCULAR | Status: AC
  Filled 2016-06-02: qty 1

## 2016-06-02 MED ORDER — SODIUM CHLORIDE 0.9 % IV SOLN
Freq: Once | INTRAVENOUS | Status: AC
  Administered 2016-06-02: 1000 mL via INTRAVENOUS

## 2016-06-02 MED ORDER — METHYLPREDNISOLONE SODIUM SUCC 125 MG IJ SOLR
125.0000 mg | Freq: Once | INTRAMUSCULAR | Status: AC
  Administered 2016-06-02: 125 mg via INTRAVENOUS

## 2016-06-02 MED ORDER — MIDAZOLAM HCL 2 MG/2ML IJ SOLN
INTRAMUSCULAR | Status: DC | PRN
  Administered 2016-06-02: 2 mg via INTRAVENOUS

## 2016-06-02 MED ORDER — LIDOCAINE-EPINEPHRINE 1 %-1:100000 IJ SOLN
INTRAMUSCULAR | Status: AC
  Filled 2016-06-02: qty 1

## 2016-06-02 SURGICAL SUPPLY — 4 items
CANNULA 5F STIFF (CANNULA) ×3 IMPLANT
CATH PALINDROME RT-P 15FX19CM (CATHETERS) ×3 IMPLANT
PACK ANGIOGRAPHY (CUSTOM PROCEDURE TRAY) ×3 IMPLANT
TOWEL OR 17X26 4PK STRL BLUE (TOWEL DISPOSABLE) ×3 IMPLANT

## 2016-06-02 NOTE — Progress Notes (Signed)
Pre hd assessment  

## 2016-06-02 NOTE — Progress Notes (Signed)
Start of hd 

## 2016-06-02 NOTE — Progress Notes (Signed)
Pre hd info 

## 2016-06-02 NOTE — Progress Notes (Signed)
[facility] [place] at [facility]   PATIENT NAME: Wyatt PortelaCaroline Crew    MR#:  [id number]  DATE OF BIRTH:  12/10/1950  SUBJECTIVE:  CHIEF COMPLAINT:   Chief Complaint  Patient presents with  . Leg Swelling   The patient is 65 year old African-American female with medical history significant for history of CK D, diabetes mellitus, CHF, hypertension, status post pacemaker placement, brain aneurysm status post coiling, who presents to the hospital with worsening swelling in lower extremities as well as abdomen, shortness of breath with minimal activity going on for the past 4-5 months. She was recently started on high doses of oral Lasix, with some improvement. However, due to hypoxia, she was sent to emergency room for further evaluation and treatment. Patient was initiated on Lasix IV drip and improved, less short of breath, less abdominal swelling, distention, less lower extremity swelling. Still have continuous worsening in the renal function so decision is made to start on hemodialysis after permacath placement likely later today or tomorrow. Hemoglobin gradually dropped less than 7.  Review of Systems  Constitutional: Negative for chills, fever and weight loss.  HENT: Negative for congestion.   Eyes: Negative for blurred vision and double vision.  Respiratory: Positive for shortness of breath. Negative for cough, sputum production and wheezing.   Cardiovascular: Positive for orthopnea and leg swelling. Negative for chest pain, palpitations and PND.  Gastrointestinal: Positive for abdominal pain. Negative for blood in stool, constipation, diarrhea, nausea and vomiting.  Genitourinary: Negative for dysuria, frequency, hematuria and urgency.  Musculoskeletal: Negative for falls.  Neurological: Negative for dizziness, tremors, focal weakness and headaches.  Endo/Heme/Allergies: Does not bruise/bleed easily.  Psychiatric/Behavioral: Negative for depression. The patient  does not have insomnia.     VITAL SIGNS: Blood pressure 134/71, pulse 61, temperature 97.9 F (36.6 C), temperature source Oral, resp. rate 15, height 5\' 5"  (1.651 m), weight 58.8 kg (129 lb 11.2 oz), SpO2 93 %.  PHYSICAL EXAMINATION:   GENERAL:  65 y.o.-year-old patient lying in the bed in mild respiratory distress, somewhat tachypneic, mildly uncomfortable, especially on exertion.  EYES: Pupils equal, round, reactive to light and accommodation. No scleral icterus. Extraocular muscles intact. Conjunctiva pale. HEENT: Head atraumatic, normocephalic. Oropharynx and nasopharynx clear.  NECK:  Supple, no jugular venous distention. No thyroid enlargement, no tenderness.  LUNGS: Diminished breath sounds bilaterally at bases, no wheezing, rales,rhonchi or crepitation. No use of accessory muscles of respiration.  CARDIOVASCULAR: S1, S2 normal. No murmurs, rubs, or gallops.  ABDOMEN: Soft, nontender, mildly distended, ascitic fluid wave on percussion. Bowel sounds present. No organomegaly or mass.  EXTREMITIES: 2+ lower extremity and pedal edema, no cyanosis, or clubbing.  NEUROLOGIC: Cranial nerves II through XII are intact. Muscle strength 5/5 in all extremities. Sensation intact. Gait not checked.  PSYCHIATRIC: The patient is alert and oriented x 3.  SKIN: No obvious rash, lesion, or ulcer.   ORDERS/RESULTS REVIEWED:   CBC  Recent Labs Lab 05/31/16 1334 06/01/16 0542 06/02/16 0529  WBC 5.4 4.1  --   HGB 7.9* 7.0* 6.5*  HCT 23.3* 20.7*  --   PLT 410 332  --   MCV 80.1 79.3*  --   MCH 27.0 26.9  --   MCHC 33.8 33.9  --   RDW 20.5* 20.4*  --    ------------------------------------------------------------------------------------------------------------------  Chemistries   Recent Labs Lab 05/31/16 1334 06/01/16 0542  NA 136 138  K 4.4 4.5  CL 111 115*  CO2 12* 12*  GLUCOSE 101*  104*  BUN 48* 50*  CREATININE 5.99* 6.05*  CALCIUM 8.6* 8.4*    ------------------------------------------------------------------------------------------------------------------ estimated creatinine clearance is 8.3 mL/min (by C-G formula based on SCr of 6.05 mg/dL (H)). ------------------------------------------------------------------------------------------------------------------ No results for input(s): TSH, T4TOTAL, T3FREE, THYROIDAB in the last 72 hours.  Invalid input(s): FREET3  Cardiac Enzymes No results for input(s): CKMB, TROPONINI, MYOGLOBIN in the last 168 hours.  Invalid input(s): CK ------------------------------------------------------------------------------------------------------------------ Invalid input(s): POCBNP ---------------------------------------------------------------------------------------------------------------  RADIOLOGY: No results found.  EKG:  Orders placed or performed during the hospital encounter of 05/31/16  . ED EKG  . ED EKG    ASSESSMENT AND PLAN:  Principal Problem:   Fluid overload Active Problems:   Protein-calorie malnutrition, severe  #1 fluid overload due to worsening renal failure, continue Lasix intravenously, patient will have PermCath placed by vascular surgery, initiated on dialysis. She wants to start dialysis at home, but was told that it could be done only when ascites is gone, she voiced understanding. Echocardiogram revealed normal cardiac function #2.  end-stage renal disease due to polycystic kidney disease, PermCath will be placed, hemodialysis will be initiated, appreciate nephrology's input. Long-term patient would like to have peritoneal dialysis done at home, but was told of ascites has to resolve first. #3 anemia of CKD, transfuse patient during dialysis , recheck hemoglobin level tomorrow morning  discussed with pt about possible side effects of blood transfusion- including more common like low grade fever to rare and serious like renal and lung involvement and  infections.  She understood- and due to necessity of transfusion- agreed to receive the transfusion. #4. Failure to thrive adult, follow oral intake 5. Generalized weakness, physical therapist evaluation will be obtained, patient's family and patient herself would like to go to rehabilitation facility, if possible  Management plans discussed with the patient, family and they are in agreement.   DRUG ALLERGIES:  Allergies  Allergen Reactions  . Iodine Other (See Comments)    Unknown reaction  . Ivp Dye [Iodinated Diagnostic Agents] Rash    CODE STATUS:     Code Status Orders        Start     Ordered   05/31/16 2102  Full code  Continuous     05/31/16 2101    Code Status History    Date Active Date Inactive Code Status Order ID Comments User Context   This patient has a current code status but no historical code status.    Advance Directive Documentation   Flowsheet Row Most Recent Value  Type of Advance Directive  Healthcare Power of Attorney  Pre-existing out of facility DNR order (yellow form or pink MOST form)  No data  "MOST" Form in Place?  No data      TOTAL TIME TAKING CARE OF THIS PATIENT: 40 minutes.  Discussed with multiple family members, all questions were answered, they voiced understanding  Altamese DillingVACHHANI, Zephaniah Lubrano M.D on 06/02/2016 at 6:06 PM  Between 7am to 6pm - Pager - [phone]  After 6pm go to [email or web address] - password [email or web address] [facility]  [facility]  Office  [phone]  CC: Primary care physician; Corky DownsMASOUD,JAVED, MD

## 2016-06-02 NOTE — H&P (Signed)
[facility] History & Physical Update  The patient was interviewed and re-examined.  The patient's previous History and Physical has been reviewed and is unchanged.  There is no change in the plan of care. We plan to proceed with the scheduled procedure.  Festus BarrenJason Tayen Narang, MD  06/02/2016, 3:59 PM

## 2016-06-02 NOTE — Op Note (Signed)
OPERATIVE NOTE    PRE-OPERATIVE DIAGNOSIS: 1. ESRD   POST-OPERATIVE DIAGNOSIS: same as above  PROCEDURE: 1. Ultrasound guidance for vascular access to the right internal jugular vein 2. Fluoroscopic guidance for placement of catheter 3. Placement of a 19 cm tip to cuff tunneled hemodialysis catheter via the right internal jugular vein  SURGEON: Festus BarrenJason Dew, MD  ANESTHESIA:  Local with Moderate conscious sedation for approximately 15 minutes using 2 mg of Versed and 75 mcg of Fentanyl  ESTIMATED BLOOD LOSS: 10 cc  FLUORO TIME: less than one minute  CONTRAST: none  FINDING(S): 1.  Patent right internal jugular vein  SPECIMEN(S):  None  INDICATIONS:   Melissa Bradshaw is a 65 y.o.female who presents with progression of renal failure and now needs dialysis.  The patient needs long term dialysis access for their ESRD, and a Permcath is necessary.  Risks and benefits are discussed and informed consent is obtained.    DESCRIPTION: After obtaining full informed written consent, the patient was brought back to the vascular suited. The patient's right neck and chest were sterilely prepped and draped in a sterile surgical field was created. Moderate conscious sedation was administered during a face to face encounter with the patient throughout the procedure with my supervision of the RN administering medicines and monitoring the patient's vital signs, pulse oximetry, telemetry and mental status throughout from the start of the procedure until the patient was taken to the recovery room.  The right internal jugular vein was visualized with ultrasound and found to be patent. It was then accessed under direct ultrasound guidance and a permanent image was recorded. A wire was placed. After skin nick and dilatation, the peel-away sheath was placed over the wire. I then turned my attention to an area under the clavicle. Approximately 1-2 fingerbreadths below the clavicle a small counterincision was  created and tunneled from the subclavicular incision to the access site. Using fluoroscopic guidance, a 19 centimeter tip to cuff tunneled hemodialysis catheter was selected, and tunneled from the subclavicular incision to the access site. It was then placed through the peel-away sheath and the peel-away sheath was removed. Using fluoroscopic guidance the catheter tips were parked in the right atrium. The appropriate distal connectors were placed. It withdrew blood well and flushed easily with heparinized saline and a concentrated heparin solution was then placed. It was secured to the chest wall with 2 Prolene sutures. The access incision was closed single 4-0 Monocryl. A 4-0 Monocryl pursestring suture was placed around the exit site. Sterile dressings were placed. The patient tolerated the procedure well and was taken to the recovery room in stable condition.  COMPLICATIONS: None  CONDITION: Stable  Festus BarrenJason Dew  04/11/2016, 12:56 PM   This note was created with Dragon Medical transcription system. Any errors in dictation are purely unintentional.

## 2016-06-02 NOTE — Progress Notes (Signed)
Patient seen and examined, full consult to follow.  Patient with chronic renal insufficiency was progressed to the point that she now needs to begin dialysis. Recovery is unlikely given the chronicity and therefore I will plan for placement of a tunneled catheter. Arrangements will be made for Friday, December 1 area

## 2016-06-02 NOTE — Progress Notes (Signed)
Spoke with dialysis RN. Will not be able to dialyze patient for another 3 hours. Will let floor nurse know.

## 2016-06-02 NOTE — Progress Notes (Signed)
End of hd tx  

## 2016-06-02 NOTE — Consult Note (Signed)
[facility] Vascular Consult Note  MRN : [id number]  Melissa Bradshaw is a 65 y.o. (11/08/1950) female who presents with chief complaint of  Chief Complaint  Patient presents with  . Leg Swelling  .  History of Present Illness: I am asked to see Melissa Bradshaw for dialysis access evaluation. The patient is a 65 y.o. female with a known history of Diabetes, congestive heart failure, hypertension, chronic kidney disease, status post cardiac pacemaker, brain aneurysm status post coiling- started having worsening swelling of her legs and increasing shortness of breath with minimal activity for last 4-5 months. She was recently started on higher dose of oral Lasix which seems to be helping some. She was seen on the day of admission by her primary care who noted significant worsening of her lower extremity edema and requested her to go to the emergency room. In the emergency room her creatinine is elevated and she is noted to have severe edema and ascites.  She was admitted and a Lasix drip was initiated. Over the past 24 hours she has not improved significantly and it is felt that she must start hemodialysis.  She is right-handed.  Current Facility-Administered Medications  Medication Dose Route Frequency Provider Last Rate Last Dose  . acetaminophen (TYLENOL) tablet 650 mg  650 mg Oral Q6H PRN Katharina Caper, MD   650 mg at 06/01/16 1240  . alum & mag hydroxide-simeth (MAALOX/MYLANTA) 200-200-20 MG/5ML suspension 30 mL  30 mL Oral Q6H PRN Katharina Caper, MD   30 mL at 06/02/16 0116  . aspirin EC tablet 81 mg  81 mg Oral Daily Altamese Dilling, MD   81 mg at 06/02/16 1006  . fentaNYL (SUBLIMAZE) 100 MCG/2ML injection           . furosemide (LASIX) 250 mg in dextrose 5 % 250 mL (1 mg/mL) infusion  5 mg/hr Intravenous Continuous Emily Filbert, MD 5 mL/hr at 06/02/16 0629 5 mg/hr at 06/02/16 0629  . heparin injection 5,000 Units  5,000 Units Subcutaneous Q8H  Altamese Dilling, MD   5,000 Units at 06/02/16 4098  . hydrALAZINE (APRESOLINE) injection 10 mg  10 mg Intravenous Q6H PRN Altamese Dilling, MD   10 mg at 05/31/16 1538  . hydrALAZINE (APRESOLINE) tablet 25 mg  25 mg Oral Q8H Altamese Dilling, MD   Stopped at 06/02/16 1456  . [START ON 06/03/2016] labetalol (NORMODYNE) tablet 200 mg  200 mg Oral q morning - 10a Altamese Dilling, MD      . latanoprost (XALATAN) 0.005 % ophthalmic solution 1 drop  1 drop Both Eyes QHS Altamese Dilling, MD   1 drop at 06/01/16 2101  . midazolam (VERSED) 2 MG/2ML injection           . ondansetron (ZOFRAN) injection 4 mg  4 mg Intravenous Q6H Altamese Dilling, MD   4 mg at 06/02/16 0624  . pantoprazole (PROTONIX) EC tablet 40 mg  40 mg Oral Daily Ihor Austin, MD   40 mg at 06/02/16 1001  . tuberculin injection 5 Units  5 Units Intradermal Once Munsoor Lateef, MD   5 Units at 06/01/16 1333    Past Medical History:  Diagnosis Date  . Anemia   . Aneurysm (HCC)   . Cardiac pacemaker   . CHF (congestive heart failure) (HCC)   . Chronic kidney disease   . Coronary artery disease   . Gastric ulcer   . Hypertension     Past Surgical History:  Procedure Laterality Date  . ANEURYSM COILING    . COLONOSCOPY     2013  . PACEMAKER PLACEMENT      Social History Social History  Substance Use Topics  . Smoking status: Former Smoker    Packs/day: 0.25    Years: 41.00    Types: Cigarettes  . Smokeless tobacco: Never Used  . Alcohol use No    Family History Family History  Problem Relation Age of Onset  . Hypertension Mother   . Diabetes Father   . Cancer Sister   . Stroke Sister   . Hypertension Sister   no family history of bleeding clotting disorders or autoimmune disease or porphyria  Allergies  Allergen Reactions  . Iodine Other (See Comments)    Unknown reaction  . Ivp Dye [Iodinated Diagnostic Agents] Rash     REVIEW OF SYSTEMS (Negative unless  checked)  Constitutional: [] Weight loss  [] Fever  [] Chills Cardiac: [] Chest pain   [] Chest pressure   [] Palpitations   [x] Shortness of breath when laying flat   [] Shortness of breath at rest   [x] Shortness of breath with exertion. Vascular:  [] Pain in legs with walking   [] Pain in legs at rest   [] Pain in legs when laying flat   [] Claudication   [] Pain in feet when walking  [] Pain in feet at rest  [] Pain in feet when laying flat   [] History of DVT   [] Phlebitis   [] Swelling in legs   [] Varicose veins   [] Non-healing ulcers Pulmonary:   [] Uses home oxygen   [] Productive cough   [] Hemoptysis   [] Wheeze  [] COPD   [] Asthma Neurologic:  [] Dizziness  [] Blackouts   [] Seizures   [] History of stroke   [] History of TIA  [] Aphasia   [] Temporary blindness   [] Dysphagia   [] Weakness or numbness in arms   [] Weakness or numbness in legs Musculoskeletal:  [] Arthritis   [] Joint swelling   [] Joint pain   [] Low back pain Hematologic:  [] Easy bruising  [] Easy bleeding   [] Hypercoagulable state   [] Anemic  [] Hepatitis Gastrointestinal:  [] Blood in stool   [] Vomiting blood  [x] Gastroesophageal reflux/heartburn   [] Difficulty swallowing. Genitourinary:  [x] Chronic kidney disease   [] Difficult urination  [] Frequent urination  [] Burning with urination   [] Blood in urine Skin:  [] Rashes   [] Ulcers   [] Wounds Psychological:  [] History of anxiety   []  History of major depression.  Physical Examination  Vitals:   06/02/16 1633 06/02/16 1645 06/02/16 1700 06/02/16 1725  BP: 113/70 129/73 138/70 134/71  Pulse:  60 61 61  Resp: 15 15 13 15   Temp:    97.9 F (36.6 C)  TempSrc:    Oral  SpO2:  94% 94% 93%  Weight:      Height:       Body mass index is 21.58 kg/m. Gen:  WD/WN, NAD Head: [place]/AT, No temporalis wasting. Prominent temp pulse not noted. Ear/Nose/Throat: Hearing grossly intact, nares w/o erythema or drainage, oropharynx w/o Erythema/Exudate Eyes: Sclera non-icteric, conjunctiva clear Neck: Trachea midline.   No JVD.  Pulmonary:  Good air movement, respirations not labored, equal bilaterally.  Cardiac: RRR, normal S1, S2. Vascular:  Vessel Right Left  Radial Palpable Palpable  Ulnar Palpable Palpable  Brachial Palpable Palpable  Carotid Palpable, without bruit Palpable, without bruit  Aorta Not palpable N/A  Femoral Palpable Palpable  Popliteal Palpable Palpable  PT Palpable Palpable  DP Palpable Palpable   Gastrointestinal: soft, non-tender/non-distended. No guarding/reflex.  Musculoskeletal: M/S 5/5 throughout.  Extremities without  ischemic changes.  No deformity or atrophy. No edema. Neurologic: Sensation grossly intact in extremities.  Symmetrical.  Speech is fluent. Motor exam as listed above. Psychiatric: Judgment intact, Mood & affect appropriate for pt's clinical situation. Dermatologic: No rashes or ulcers noted.  No cellulitis or open wounds. Lymph : No Cervical, Axillary, or Inguinal lymphadenopathy.   CBC Lab Results  Component Value Date   WBC 4.1 06/01/2016   HGB 6.5 (L) 06/02/2016   HCT 20.7 (L) 06/01/2016   MCV 79.3 (L) 06/01/2016   PLT 332 06/01/2016    BMET    Component Value Date/Time   NA 138 06/01/2016 0542   NA 141 01/30/2014 1505   K 4.5 06/01/2016 0542   K 5.0 01/30/2014 1505   CL 115 (H) 06/01/2016 0542   CL 116 (H) 01/30/2014 1505   CO2 12 (L) 06/01/2016 0542   CO2 17 (L) 01/30/2014 1505   GLUCOSE 104 (H) 06/01/2016 0542   GLUCOSE 93 01/30/2014 1505   BUN 50 (H) 06/01/2016 0542   BUN 50 (H) 06/19/2014 1413   CREATININE 6.05 (H) 06/01/2016 0542   CREATININE 3.33 (H) 06/19/2014 1413   CALCIUM 8.4 (L) 06/01/2016 0542   CALCIUM 8.8 01/30/2014 1505   GFRNONAA 7 (L) 06/01/2016 0542   GFRNONAA 15 (L) 06/19/2014 1413   GFRNONAA 14 (L) 01/30/2014 1505   GFRAA 8 (L) 06/01/2016 0542   GFRAA 18 (L) 06/19/2014 1413   GFRAA 16 (L) 01/30/2014 1505   Estimated Creatinine Clearance: 8.3 mL/min (by C-G formula based on SCr of 6.05 mg/dL (H)).  COAG No  results found for: INR, PROTIME  Radiology US Abdomen Limited Ruq  Result Date: 05/09/2016 CLINICAL DATA:  Abdominal swelling, known hepatic cysts and renal cysts. EXAM: US ABDOMEN LIMITED - RIGHT UPPER QUADRANT COMPARISON:  CT scan of the abdomen and pelvis of June 19, 2015 FINDINGS: Gallbladder: The gallbladder wall is thickened measuring 5 mm. There is surrounding ascites. There is no evidence of stones or sludge. There is no positive sonographic Murphy's sign. Common bile duct: Diameter: 5 mm Liver: There are multiple hepatic cysts. In the left lobe there is a cyst measuring as much as 9.7 cm. In the right lobe there is a cyst measuring 7.8 cm. Inferiorly in the right lobe there is a cyst measuring 5.2 cm in greatest dimension. Multiple smaller cysts are present. There is ascites in the right upper and lower quadrants and left lower quadrant. IMPRESSION: Moderate volume of ascites present. Thickening of the gallbladder wall likely secondary to ascites. No evidence of stones. No positive sonographic Murphy's sign. Multiple hepatic cysts of varying sizes. No suspicious solid masses are observed. Electronically Signed   By: David  [place] M.D.   On: 05/09/2016 11:15      Assessment/Plan 1.  End stage renal disease:   The patient is on a IV Lasix driput this does not appear to be helping control her anasarca.  I will plan for a tunneled catheter insertion and nephrology will initiate hemodialysis. She will follow up with me as an outpatient in the office where vein mapping will be performed to plan for her upper extremity access.  2.  Uncontrolled hypertension   Systolic blood pressure is more than 200 currently.   Continue amlodipine and metoprolol as home dose.   Add hydralazine oral scheduled and give IV hydralazine when necessary for high blood pressure.  3.  Congestive heart failure and status post pacemaker   We don't have a baseline echocardiogram, I  will order here.   Patient have  acute on chronic systolic congestive heart failure currently, she will be on IV Lasix.  4.  Chronic anemia due to chronic kidney disease   Continue monitoring.    Levora DredgeGregory Arnecia Ector, MD  06/02/2016 5:39 PM    This note was created with Dragon medical transcription system.  Any error is purely unintentional

## 2016-06-02 NOTE — Progress Notes (Signed)
This is to certify that Ms.Melissa PortelaCaroline Bradshaw ( DOB- Jan 27, 1951) is admitted in the hospital on 05/31/16, and still in as inpatient. [name] was with her in hospital.  Any further questions, please call the hospital.  Thanks. -Leary Rocar.Vaibhav Jennalynn Rivard.

## 2016-06-02 NOTE — Progress Notes (Signed)
Post hd assessment 

## 2016-06-02 NOTE — Progress Notes (Signed)
This note also relates to the following rows which could not be included: Rate - Cannot attach notes to extension rows Line - Cannot attach notes to extension rows  Start of transfusion 

## 2016-06-02 NOTE — Progress Notes (Signed)
[facility]  ROUNDING NOTE   Subjective:  Patient due for PermCath placement today. Still has distended abdomen. Urine output was 1.1 L over the preceding 24 hours.   Objective:  Vital signs in last 24 hours:  Temp:  [98.6 F (37 C)-98.9 F (37.2 C)] 98.7 F (37.1 C) (11/30 1252) Pulse Rate:  [61-64] 64 (11/30 1252) Resp:  [17-20] 18 (11/30 1252) BP: (100-124)/(56-63) 104/57 (11/30 1252) SpO2:  [100 %] 100 % (11/30 1252) Weight:  [56.2 kg (123 lb 12.8 oz)-58.8 kg (129 lb 11.2 oz)] 58.8 kg (129 lb 11.2 oz) (11/30 0500)  Weight change: 10.8 kg (23 lb 12.8 oz) Filed Weights   05/31/16 1313 06/01/16 1700 06/02/16 0500  Weight: 45.4 kg (100 lb) 56.2 kg (123 lb 12.8 oz) 58.8 kg (129 lb 11.2 oz)    Intake/Output: I/O last 3 completed shifts: In: 304.8 [P.O.:120; I.V.:184.8] Out: 1100 [Urine:1100]   Intake/Output this shift:  Total I/O In: 0  Out: 350 [Urine:350]  Physical Exam: General: No acute distress  Head: Normocephalic, atraumatic. Moist oral mucosal membranes  Eyes: Anicteric  Neck: Supple, trachea midline  Lungs:  Basilar rales, normal effort  Heart: S1S2 no rubs  Abdomen:  Distended, bowel sounds present  Extremities: trace peripheral edema.  Neurologic: Nonfocal, moving all four extremities  Skin: No lesions  Access: none    Basic Metabolic Panel:  Recent Labs Lab 05/31/16 1334 06/01/16 0542  NA 136 138  K 4.4 4.5  CL 111 115*  CO2 12* 12*  GLUCOSE 101* 104*  BUN 48* 50*  CREATININE 5.99* 6.05*  CALCIUM 8.6* 8.4*    Liver Function Tests: No results for input(s): AST, ALT, ALKPHOS, BILITOT, PROT, ALBUMIN in the last 168 hours. No results for input(s): LIPASE, AMYLASE in the last 168 hours. No results for input(s): AMMONIA in the last 168 hours.  CBC:  Recent Labs Lab 05/31/16 1334 06/01/16 0542 06/02/16 0529  WBC 5.4 4.1  --   HGB 7.9* 7.0* 6.5*  HCT 23.3* 20.7*  --   MCV 80.1 79.3*  --   PLT 410 332  --     Cardiac  Enzymes: No results for input(s): CKTOTAL, CKMB, CKMBINDEX, TROPONINI in the last 168 hours.  BNP: Invalid input(s): POCBNP  CBG: No results for input(s): GLUCAP in the last 168 hours.  Microbiology: No results found for this or any previous visit.  Coagulation Studies: No results for input(s): LABPROT, INR in the last 72 hours.  Urinalysis:  Recent Labs  05/31/16 1831  COLORURINE YELLOW*  LABSPEC 1.010  PHURINE 5.0  GLUCOSEU NEGATIVE  HGBUR 1+*  BILIRUBINUR NEGATIVE  KETONESUR NEGATIVE  PROTEINUR 100*  NITRITE NEGATIVE  LEUKOCYTESUR NEGATIVE      Imaging: No results found.   Medications:   . furosemide (LASIX) infusion 5 mg/hr (06/02/16 0629)   . fentaNYL      . midazolam      . [MAR Hold] aspirin EC  81 mg Oral Daily  . cefUROXime (ZINACEF)  IV  1.5 g Intravenous Once  . [MAR Hold] heparin  5,000 Units Subcutaneous Q8H  . [MAR Hold] hydrALAZINE  25 mg Oral Q8H  . [MAR Hold] labetalol  200 mg Oral q morning - 10a  . [MAR Hold] latanoprost  1 drop Both Eyes QHS  . [MAR Hold] ondansetron (ZOFRAN) IV  4 mg Intravenous Q6H  . [MAR Hold] pantoprazole  40 mg Oral Daily  . tuberculin  5 Units Intradermal Once   [facility][MAR Hold]  acetaminophen, [MAR Hold] alum & mag hydroxide-simeth, fentaNYL, [MAR Hold] hydrALAZINE, midazolam  Assessment/ Plan:  65 y.o. African American female with past medical history of chronic kidney disease stage V, anemia chronic kidney disease, hypertension, secondary hyperparathyroidism, history of gastric ulcer, history of pacemaker placement, polycystic kidney disease, history of TIA, vertebral artery aneurysm status post coiling, history of hyperkalemia, and metabolic acidosis   1. End-stage renal disease secondary to polycystic kidney disease: The patient is clearly in need of renal replacement therapy at this time. She has needed renal placement therapy since at least July of this past year. Patient has been lost to follow-up since July.  -  PermCath to be placed today.  We will plan for dialysis thereafter, patient will have short dialysis treatment.  We plan for 1.5 hours today with a blood flow rate of 150 in the dialysate flow rate of 300.  2. Metabolic acidosis. This will be treated with hemodialysis.  Continue to monitor serum bicarbonate.  3. Anemia chronic kidney disease. emoglobin has dropped to 6.5.  Recommend blood transfusion during dialysis today.  Discussed with hospitalist.  4. Secondary hyperparathyroidism. Check intact PTH and phosphorus with dialysis today.  5.  Pericardial effusion.  Likely secondary to uremia.  This will be treated slowly with dialysis.    LOS: 2 Janice Seales 11/30/20174:24 PM

## 2016-06-03 ENCOUNTER — Inpatient Hospital Stay: Payer: Medicare Other

## 2016-06-03 ENCOUNTER — Encounter: Payer: Self-pay | Admitting: Vascular Surgery

## 2016-06-03 LAB — TYPE AND SCREEN
ABO/RH(D): B POS
ANTIBODY SCREEN: NEGATIVE
Unit division: 0

## 2016-06-03 LAB — CBC
HCT: 25.8 % — ABNORMAL LOW (ref 35.0–47.0)
Hemoglobin: 8.8 g/dL — ABNORMAL LOW (ref 12.0–16.0)
MCH: 27.2 pg (ref 26.0–34.0)
MCHC: 34.1 g/dL (ref 32.0–36.0)
MCV: 79.8 fL — ABNORMAL LOW (ref 80.0–100.0)
PLATELETS: 307 10*3/uL (ref 150–440)
RBC: 3.23 MIL/uL — AB (ref 3.80–5.20)
RDW: 18.4 % — ABNORMAL HIGH (ref 11.5–14.5)
WBC: 4.7 10*3/uL (ref 3.6–11.0)

## 2016-06-03 LAB — BASIC METABOLIC PANEL
ANION GAP: 14 (ref 5–15)
BUN: 47 mg/dL — ABNORMAL HIGH (ref 6–20)
CO2: 15 mmol/L — ABNORMAL LOW (ref 22–32)
Calcium: 8.8 mg/dL — ABNORMAL LOW (ref 8.9–10.3)
Chloride: 107 mmol/L (ref 101–111)
Creatinine, Ser: 6.36 mg/dL — ABNORMAL HIGH (ref 0.44–1.00)
GFR, EST AFRICAN AMERICAN: 7 mL/min — AB (ref >60)
GFR, EST NON AFRICAN AMERICAN: 6 mL/min — AB (ref >60)
Glucose, Bld: 124 mg/dL — ABNORMAL HIGH (ref 65–99)
POTASSIUM: 4.3 mmol/L (ref 3.5–5.1)
SODIUM: 136 mmol/L (ref 135–145)

## 2016-06-03 LAB — PHOSPHORUS: PHOSPHORUS: 5.8 mg/dL — AB (ref 2.5–4.6)

## 2016-06-03 MED ORDER — AMLODIPINE BESYLATE 10 MG PO TABS
10.0000 mg | ORAL_TABLET | Freq: Every day | ORAL | Status: DC
  Administered 2016-06-05 – 2016-06-07 (×3): 10 mg via ORAL
  Filled 2016-06-03 (×3): qty 1

## 2016-06-03 MED ORDER — MORPHINE SULFATE (PF) 4 MG/ML IV SOLN
1.0000 mg | INTRAVENOUS | Status: DC | PRN
  Administered 2016-06-03: 1 mg via INTRAVENOUS
  Filled 2016-06-03: qty 1

## 2016-06-03 MED ORDER — SODIUM CHLORIDE 0.9 % IV SOLN: 100.0000 mL | INTRAVENOUS | Status: DC | PRN

## 2016-06-03 MED ORDER — LIDOCAINE-PRILOCAINE 2.5-2.5 % EX CREA
1.0000 "application " | TOPICAL_CREAM | CUTANEOUS | Status: DC | PRN
  Filled 2016-06-03: qty 5

## 2016-06-03 MED ORDER — PENTAFLUOROPROP-TETRAFLUOROETH EX AERO
1.0000 "application " | INHALATION_SPRAY | CUTANEOUS | Status: DC | PRN
  Filled 2016-06-03: qty 30

## 2016-06-03 MED ORDER — HYDROCODONE-ACETAMINOPHEN 5-325 MG PO TABS
1.0000 | ORAL_TABLET | ORAL | Status: DC | PRN
  Administered 2016-06-03 – 2016-06-05 (×6): 1 via ORAL
  Filled 2016-06-03 (×6): qty 1

## 2016-06-03 MED ORDER — LIDOCAINE HCL (PF) 1 % IJ SOLN
5.0000 mL | INTRAMUSCULAR | Status: DC | PRN
Start: 2016-06-03 — End: 2016-06-05
  Filled 2016-06-03: qty 5

## 2016-06-03 MED ORDER — ALTEPLASE 2 MG IJ SOLR: 2.0000 mg | Freq: Once | INTRAMUSCULAR | Status: DC | PRN

## 2016-06-03 MED ORDER — HEPARIN SODIUM (PORCINE) 1000 UNIT/ML DIALYSIS
1000.0000 [IU] | INTRAMUSCULAR | Status: DC | PRN
  Filled 2016-06-03: qty 1

## 2016-06-03 MED ORDER — SODIUM CHLORIDE 0.9 % IV SOLN
100.0000 mL | INTRAVENOUS | Status: DC | PRN
Start: 2016-06-03 — End: 2016-06-05

## 2016-06-03 MED ORDER — ACETAMINOPHEN 325 MG PO TABS
650.0000 mg | ORAL_TABLET | Freq: Once | ORAL | Status: AC
  Administered 2016-06-03: 650 mg via ORAL

## 2016-06-03 MED ORDER — EPOETIN ALFA 10000 UNIT/ML IJ SOLN
10000.0000 [IU] | INTRAMUSCULAR | Status: DC
  Administered 2016-06-03 – 2016-06-07 (×3): 10000 [IU] via INTRAVENOUS
  Filled 2016-06-03 (×2): qty 1

## 2016-06-03 NOTE — Progress Notes (Signed)
Nutrition Follow-up  DOCUMENTATION CODES:   Severe malnutrition in context of chronic illness, Underweight  INTERVENTION:  -Will discontinue Magic Cup between meals per pt request; pt does not want any nutritional supplements at this time -Pt would benefit from snacks between meals, pt declined at this time  NUTRITION DIAGNOSIS:   Malnutrition related to chronic illness as evidenced by severe depletion of muscle mass, severe depletion of body fat, severe fluid accumulation, energy intake < or equal to 75% for > or equal to 1 month.  GOAL:   Patient will meet greater than or equal to 90% of their needs  MONITOR:   PO intake, Supplement acceptance, Labs, Weight trends, I & O's  REASON FOR ASSESSMENT:   Malnutrition Screening Tool    ASSESSMENT:    65 yo female admitted with fluid overload with CKD V not yet on dialysis, CHF, uncontrolled HTN  Pt s/p permcath and 1st dialysis treatment yesterday, plan for 2nd treatment today Pt reports she feels better after her first dialysis treatment, edema is decreasing, appetite improving. Recorded po intake 100% of breakfast and lunch today. Pt with 2 Magic Cup supplements at bedside; pt asked why she was receiving them, upset that we kept sending these and she is not going to eat them.  Labs: phosphorus 5.8 Meds: lasix drip  Diet Order:  Diet renal with fluid restriction Fluid restriction: 1200 mL Fluid; Room service appropriate? Yes; Fluid consistency: Thin  Skin:  Reviewed, no issues  Last BM:  11/27  Height:   Ht Readings from Last 1 Encounters:  05/31/16 5\' 5"  (1.651 m)    Weight:   Wt Readings from Last 1 Encounters:  06/02/16 127 lb 13.9 oz (58 kg)   Filed Weights   06/02/16 1914 06/02/16 2120 06/02/16 2300  Weight: 140 lb 11.2 oz (63.8 kg) 129 lb (58.5 kg) 127 lb 13.9 oz (58 kg)    BMI:  Body mass index is 21.28 kg/m.  Estimated Nutritional Needs:   Kcal:  1400-1700 kcals  Protein:  54-68 g  Fluid:  1000  mL plus UOP  EDUCATION NEEDS:   Education needs addressed  Romelle Starcherate Salathiel Ferrara MS, RD, LDN [phone] Pager  [phone] Weekend/On-Call Pager

## 2016-06-03 NOTE — Care Management (Signed)
Patient admitted for fluid overload and renal failure.  Patient lives at home with her adult handicap son.  Patient states that at baseline performs all of her Adl's, drives, and cares for her son.  PCP MASOUD.  Pharmacy [facility]. Patient initiated on HD this admission.  Susann Givensheryl Brawner [facility] liaison aware and [facility] [address]  PT has assessed patient and currently patient is declining home health services.  RNCM following

## 2016-06-03 NOTE — Care Management Important Message (Signed)
Important Message  Patient Details  Name: Melissa Bradshaw MRN: [id number] Date of Birth: 06/20/1951   Medicare Important Message Given:  Yes    Chapman FitchBOWEN, Tevis Dunavan T, RN 06/03/2016, 12:33 PM

## 2016-06-03 NOTE — Progress Notes (Signed)
[facility] - [place] at [facility]   PATIENT NAME: Melissa Bradshaw    MR#:  [id number]  DATE OF BIRTH:  1951/03/05  SUBJECTIVE:  CHIEF COMPLAINT:   Chief Complaint  Patient presents with  . Leg Swelling   The patient is 65 year old African-American female with medical history significant for history of CK D, diabetes mellitus, CHF, hypertension, status post pacemaker placement, brain aneurysm status post coiling, who presents to the hospital with worsening swelling in lower extremities as well as abdomen, shortness of breath with minimal activity going on for the past 4-5 months. She was recently started on high doses of oral Lasix, with some improvement. However, due to hypoxia, she was sent to emergency room for further evaluation and treatment. Patient was initiated on Lasix IV drip and improved, less short of breath, less abdominal swelling, distention, less lower extremity swelling. Hemoglobin gradually dropped less than 7. She received One unit PRBC on 06/02/16. Started on HD on 06/02/16.  Review of Systems  Constitutional: Negative for chills, fever and weight loss.  HENT: Negative for congestion.   Eyes: Negative for blurred vision and double vision.  Respiratory: Positive for shortness of breath. Negative for cough, sputum production and wheezing.   Cardiovascular: Positive for orthopnea and leg swelling. Negative for chest pain, palpitations and PND.  Gastrointestinal: Positive for abdominal pain. Negative for blood in stool, constipation, diarrhea, nausea and vomiting.  Genitourinary: Negative for dysuria, frequency, hematuria and urgency.  Musculoskeletal: Negative for falls.  Neurological: Negative for dizziness, tremors, focal weakness and headaches.  Endo/Heme/Allergies: Does not bruise/bleed easily.  Psychiatric/Behavioral: Negative for depression. The patient does not have insomnia.     VITAL SIGNS: Blood pressure 115/66, pulse 67, temperature  97.9 F (36.6 C), temperature source Oral, resp. rate 20, height 5\' 5"  (1.651 m), weight 58 kg (127 lb 13.9 oz), SpO2 99 %.  PHYSICAL EXAMINATION:   GENERAL:  65 y.o.-year-old patient lying in the bed in mild respiratory distress, somewhat tachypneic, mildly uncomfortable, especially on exertion.  EYES: Pupils equal, round, reactive to light and accommodation. No scleral icterus. Extraocular muscles intact. Conjunctiva pale. HEENT: Head atraumatic, normocephalic. Oropharynx and nasopharynx clear.  NECK:  Supple, no jugular venous distention. No thyroid enlargement, no tenderness.  LUNGS: Diminished breath sounds bilaterally at bases, no wheezing, rales,rhonchi or crepitation. No use of accessory muscles of respiration.  CARDIOVASCULAR: S1, S2 normal. No murmurs, rubs, or gallops.  ABDOMEN: Soft, nontender, mildly distended, ascitic fluid wave on percussion. Bowel sounds present. No organomegaly or mass.  EXTREMITIES: 2+ lower extremity and pedal edema, no cyanosis, or clubbing.  NEUROLOGIC: Cranial nerves II through XII are intact. Muscle strength 5/5 in all extremities. Sensation intact. Gait not checked.  PSYCHIATRIC: The patient is alert and oriented x 3.  SKIN: No obvious rash, lesion, or ulcer.   ORDERS/RESULTS REVIEWED:   CBC  Recent Labs Lab 05/31/16 1334 06/01/16 0542 06/02/16 0529 06/03/16 0507  WBC 5.4 4.1  --  4.7  HGB 7.9* 7.0* 6.5* 8.8*  HCT 23.3* 20.7*  --  25.8*  PLT 410 332  --  307  MCV 80.1 79.3*  --  79.8*  MCH 27.0 26.9  --  27.2  MCHC 33.8 33.9  --  34.1  RDW 20.5* 20.4*  --  18.4*   ------------------------------------------------------------------------------------------------------------------  Chemistries   Recent Labs Lab 05/31/16 1334 06/01/16 0542 06/03/16 0507  NA 136 138 136  K 4.4 4.5 4.3  CL 111 115* 107  CO2 12* 12*  15*  GLUCOSE 101* 104* 124*  BUN 48* 50* 47*  CREATININE 5.99* 6.05* 6.36*  CALCIUM 8.6* 8.4* 8.8*    ------------------------------------------------------------------------------------------------------------------ estimated creatinine clearance is 7.9 mL/min (by C-G formula based on SCr of 6.36 mg/dL (H)). ------------------------------------------------------------------------------------------------------------------ No results for input(s): TSH, T4TOTAL, T3FREE, THYROIDAB in the last 72 hours.  Invalid input(s): FREET3  Cardiac Enzymes No results for input(s): CKMB, TROPONINI, MYOGLOBIN in the last 168 hours.  Invalid input(s): CK ------------------------------------------------------------------------------------------------------------------ Invalid input(s): POCBNP ---------------------------------------------------------------------------------------------------------------  RADIOLOGY: [place] Vein Mapping  Result Date: 06/03/2016 CLINICAL DATA:  65 year old female with end-stage renal disease in need of hemodialysis. Upper extremity vascular mapping study. EXAM: UPPER EXTREMITY VASCULAR MAPPING STUDY COMPARISON:  None. FINDINGS: Arterial: Radial artery: Patent. The spectral Doppler waveform is monophasic but demonstrates a crisp systolic upstroke. No evidence of parvus et tardus waveform to suggest proximal stenosis. The artery measures 2 mm in diameter at the wrist. Ulnar artery: Spectral Doppler waveform is monophasic but demonstrates a crisp systolic upstroke. No evidence of parvus et tardus waveform to suggest proximal stenosis. Measures 2 mm at the wrist. Brachial artery: Spectral Doppler waveform is monophasic but demonstrates a crisp systolic upstroke. No evidence of parvus et tardus waveform to suggest proximal stenosis. The artery measures 5 mm at the antecubital fossa. Venous: Cephalic vein: Cephalic vein is small measuring less than 2 mm in the upper arm. Basilic vein: The basilic vein is also relatively small and exists as a multiple small branches measuring only 2 mm in the  proximal upper arm, and less than 2 mm in the distal upper arm. Brachial vein: Robust brachial vein measuring between 4 and 6 mm throughout the upper arm. No evidence of DVT. Subclavian vein: Normal respiratory phasicity. The vessels are patent and there is no evidence of central obstruction or stenosis. Left internal jugular vein: Patent with normal spectral waveform. IMPRESSION: 1. Left upper extremity vascular mapping study as detailed above. 2. The basilic and cephalic veins are small (less than 2 mm). Signed, Sterling BigHeath K. McCullough, MD [facility] Electronically Signed   By: Malachy MoanHeath  McCullough M.D.   On: 06/03/2016 13:32    EKG:  Orders placed or performed during the hospital encounter of 05/31/16  . ED EKG  . ED EKG    ASSESSMENT AND PLAN:  Principal Problem:   Fluid overload Active Problems:   Protein-calorie malnutrition, severe  #1 fluid overload due to worsening renal failure, given Lasix intravenously,   s/p HD catheter placed and HD started 06/02/16.  pt still need to decide about her wish to continue HD or PD on discharge. #2.  end-stage renal disease due to polycystic kidney disease,   appreciate nephrology's input. Long-term patient would like to have peritoneal dialysis done at home, but was told of ascites has to resolve first. #3 anemia of CKD, transfuse patient during dialysis ( 06/02/16) , recheck hemoglobin came up. #4. Failure to thrive adult, follow oral intake #5. Generalized weakness, physical therapist evaluation will be obtained, patient's family and patient herself would like to go to rehabilitation facility, if possible  Management plans discussed with the patient, family and they are in agreement.   DRUG ALLERGIES:  Allergies  Allergen Reactions  . Iodine Other (See Comments)    Unknown reaction  . Ivp Dye [Iodinated Diagnostic Agents] Rash    CODE STATUS:     Code Status Orders         Start     Ordered  05/31/16 2102  Full code  Continuous     05/31/16 2101    Code Status History    Date Active Date Inactive Code Status Order ID Comments User Context   This patient has a current code status but no historical code status.    Advance Directive Documentation   Flowsheet Row Most Recent Value  Type of Advance Directive  Healthcare Power of Attorney  Pre-existing out of facility DNR order (yellow form or pink MOST form)  No data  "MOST" Form in Place?  No data      TOTAL TIME TAKING CARE OF THIS PATIENT: 40 minutes.  Discussed with multiple family members, all questions were answered, they voiced understanding  Altamese DillingVACHHANI, Shelley Cocke M.D on 06/03/2016 at 5:28 PM  Between 7am to 6pm - Pager - [phone]  After 6pm go to [email or web address] - password [email or web address]  [facility]  Office  [phone]  CC: Primary care physician; Corky DownsMASOUD,JAVED, MD

## 2016-06-03 NOTE — Progress Notes (Addendum)
[facility]  ROUNDING NOTE   Subjective:  Right internal jugular PermCath was placed yesterday. She also had her first dialysis treatment yesterday. Patient states that she is already feeling better. She is due for her second dialysis treatment today.   Objective:  Vital signs in last 24 hours:  Temp:  [97.7 F (36.5 C)-98.7 F (37.1 C)] 97.7 F (36.5 C) (12/01 0447) Pulse Rate:  [59-81] 81 (12/01 0447) Resp:  [13-20] 20 (12/01 0447) BP: (100-171)/(56-93) 163/91 (12/01 0447) SpO2:  [93 %-100 %] 100 % (12/01 0447) Weight:  [58 kg (127 lb 13.9 oz)-63.8 kg (140 lb 11.2 oz)] 58 kg (127 lb 13.9 oz) (11/30 2300)  Weight change: 7.666 kg (16 lb 14.4 oz) Filed Weights   06/02/16 1914 06/02/16 2120 06/02/16 2300  Weight: 63.8 kg (140 lb 11.2 oz) 58.5 kg (129 lb) 58 kg (127 lb 13.9 oz)    Intake/Output: I/O last 3 completed shifts: In: 544.1 [I.V.:194.1; Blood:350] Out: 900 [Urine:550; Other:350]   Intake/Output this shift:  Total I/O In: 480 [P.O.:480] Out: -   Physical Exam: General: No acute distress  Head: Normocephalic, atraumatic. Moist oral mucosal membranes  Eyes: Anicteric  Neck: Supple, trachea midline  Lungs:  Basilar rales, normal effort  Heart: S1S2 no rubs  Abdomen:  Distended, bowel sounds present  Extremities: trace peripheral edema.  Neurologic: Nonfocal, moving all four extremities  Skin: No lesions  Access: none    Basic Metabolic Panel:  Recent Labs Lab 05/31/16 1334 06/01/16 0542 06/03/16 0507  NA 136 138 136  K 4.4 4.5 4.3  CL 111 115* 107  CO2 12* 12* 15*  GLUCOSE 101* 104* 124*  BUN 48* 50* 47*  CREATININE 5.99* 6.05* 6.36*  CALCIUM 8.6* 8.4* 8.8*    Liver Function Tests: No results for input(s): AST, ALT, ALKPHOS, BILITOT, PROT, ALBUMIN in the last 168 hours. No results for input(s): LIPASE, AMYLASE in the last 168 hours. No results for input(s): AMMONIA in the last 168 hours.  CBC:  Recent Labs Lab  05/31/16 1334 06/01/16 0542 06/02/16 0529 06/03/16 0507  WBC 5.4 4.1  --  4.7  HGB 7.9* 7.0* 6.5* 8.8*  HCT 23.3* 20.7*  --  25.8*  MCV 80.1 79.3*  --  79.8*  PLT 410 332  --  307    Cardiac Enzymes: No results for input(s): CKTOTAL, CKMB, CKMBINDEX, TROPONINI in the last 168 hours.  BNP: Invalid input(s): POCBNP  CBG: No results for input(s): GLUCAP in the last 168 hours.  Microbiology: No results found for this or any previous visit.  Coagulation Studies: No results for input(s): LABPROT, INR in the last 72 hours.  Urinalysis:  Recent Labs  05/31/16 1831  COLORURINE YELLOW*  LABSPEC 1.010  PHURINE 5.0  GLUCOSEU NEGATIVE  HGBUR 1+*  BILIRUBINUR NEGATIVE  KETONESUR NEGATIVE  PROTEINUR 100*  NITRITE NEGATIVE  LEUKOCYTESUR NEGATIVE      Imaging: No results found.   Medications:   . furosemide (LASIX) infusion 5 mg/hr (06/02/16 0629)   . aspirin EC  81 mg Oral Daily  . heparin  5,000 Units Subcutaneous Q8H  . hydrALAZINE  25 mg Oral Q8H  . labetalol  200 mg Oral q morning - 10a  . latanoprost  1 drop Both Eyes QHS  . ondansetron (ZOFRAN) IV  4 mg Intravenous Q6H  . pantoprazole  40 mg Oral Daily  . tuberculin  5 Units Intradermal Once   sodium chloride, sodium chloride, acetaminophen, alteplase, alum & mag hydroxide-simeth,  heparin, hydrALAZINE, HYDROcodone-acetaminophen, lidocaine (PF), lidocaine-prilocaine, morphine injection, pentafluoroprop-tetrafluoroeth  Assessment/ Plan:  65 y.o. African American female with past medical history of chronic kidney disease stage V, anemia chronic kidney disease, hypertension, secondary hyperparathyroidism, history of gastric ulcer, history of pacemaker placement, polycystic kidney disease, history of TIA, vertebral artery aneurysm status post coiling, history of hyperkalemia, and metabolic acidosis   1. End-stage renal disease secondary to polycystic kidney disease: The patient is clearly in need of renal  replacement therapy at this time. She has needed renal placement therapy since at least July of this past year. Patient has been lost to follow-up since July. PermCath placed 06/02/2016. - Patient due for her second dialysis treatment today. We will also obtain hepatitis serologies. In addition we will obtain vein mapping. We plan for her third dialysis treatment tomorrow. Outpatient dialysis placement ongoing.  2. Metabolic acidosis. Serum bicarbonate up to 15. Continue to monitor.  3. Anemia chronic kidney disease. hgb up to 8.8, start on epogen 10000 units IV with HD.  4. Secondary hyperparathyroidism. Check phosphorus with dialysis today. Consider Binder therapy depending upon serum phosphorus.  5.  Pericardial effusion.  Likely secondary to uremia.  This will be treated slowly with dialysis.    LOS: 3 Melissa Bradshaw 12/1/20178:48 AM

## 2016-06-03 NOTE — Progress Notes (Signed)
   06/03/16 1358  PPD Results  Does patient have an induration at the injection site? No  Induration(mm) 0 mm (none)  Name of Physician Notified Elisabeth PigeonVachhani

## 2016-06-03 NOTE — Progress Notes (Signed)
Pt complained of [facility] Pt requested Tylenol. Primary Nurse notified Dr. Anne HahnWillis. Orders received. Primary nurse to continue to monitor.

## 2016-06-03 NOTE — Care Management (Signed)
11/30 HD flow sheet faxed to Susann Givensheryl Brawner

## 2016-06-03 NOTE — Progress Notes (Signed)
Post hd vitals 

## 2016-06-03 NOTE — Care Management (Signed)
PPD results faxed to Melissa Bradshaw

## 2016-06-03 NOTE — Progress Notes (Signed)
Pre hd assessment  

## 2016-06-03 NOTE — Progress Notes (Signed)
   06/02/16 2130 06/02/16 2145 06/02/16 2200  During Hemodialysis Assessment  Blood Flow Rate (mL/min) 150 mL/min 150 mL/min 150 mL/min  Arterial Pressure (mmHg) -30 mmHg -50 mmHg -40 mmHg  Venous Pressure (mmHg) 40 mmHg 40 mmHg 80 mmHg  Transmembrane Pressure (mmHg) 30 mmHg 30 mmHg 30 mmHg  Ultrafiltration Rate (mL/min) 670 mL/min 670 mL/min 670 mL/min  Dialysate Flow Rate (mL/min) 300 ml/min 300 ml/min 300 ml/min  Conductivity: Machine  14 14.6 15.3  HD Safety Checks Performed Yes Yes Yes  Dialysis Fluid Bolus Normal Saline --  Blood Products  Bolus Amount (mL) 250 mL (prime) --  50 mL  Dialysate Change (3k) --  --   Intra-Hemodialysis Comments start of hd, pt alert, no c/o, vss.  190. pt alert, no c/o, vss 418. pt alert, no c/o, vss, transfusion started, half added to goal     06/02/16 2215 06/02/16 2230 06/02/16 2245  During Hemodialysis Assessment  Blood Flow Rate (mL/min) 150 mL/min 150 mL/min 150 mL/min  Arterial Pressure (mmHg) -40 mmHg -60 mmHg -100 mmHg  Venous Pressure (mmHg) 80 mmHg 90 mmHg 70 mmHg  Transmembrane Pressure (mmHg) 30 mmHg 40 mmHg 40 mmHg  Ultrafiltration Rate (mL/min) 850 mL/min 850 mL/min 960 mL/min  Dialysate Flow Rate (mL/min) 300 ml/min 300 ml/min 300 ml/min  Conductivity: Machine  15.2 15.3 13.9  HD Safety Checks Performed Yes Yes Yes  Dialysis Fluid Bolus Blood Products Blood Products --   Bolus Amount (mL) 100 mL 150 mL --   Dialysate Change --  --  --   Intra-Hemodialysis Comments 522. pt alert, no c/o, vss.  736. pt alert, no c/o, vss.  997. pt alert, no c/o, vss, transfusion complete with no complications.       06/02/16 2300  During Hemodialysis Assessment  Dialysis Fluid Bolus Normal Saline  Bolus Amount (mL) 250 mL  Intra-Hemodialysis Comments 1200. tx ended

## 2016-06-03 NOTE — Progress Notes (Signed)
  End of hd 

## 2016-06-03 NOTE — Clinical Social Work Note (Signed)
PT has assessed patient and recommending home health. Patient is declining STR at this tims. RN CM spoke with patient about home health and she has declined home health. CSW signing off. York SpanielMonica Sigurd Pugh MSW,LCSW [phone]

## 2016-06-03 NOTE — Progress Notes (Signed)
Pre hd info 

## 2016-06-03 NOTE — Progress Notes (Signed)
PT Cancellation Note  Patient Details Name: Melissa Bradshaw MRN: [id number] DOB: 09/11/1950   Cancelled Treatment:    Reason Eval/Treat Not Completed: Other (comment) (Treatment session attempted. Patient adamantly refusing participation with session at times time; reports being frustrated with "all these people tapping on my door".  Reinforced role of PT and need for continued mobility throughout stay; contined to decline.  Does report she has been walking to/from bathroom without difficulty over the last couple days and feel notably stronger/better after initiation of dialysis. Will re-attempt at later time/date as patient agreeable and medically appropriate.)   [name] H. Manson PasseyBrown, PT, DPT, NCS 06/03/16, 3:55 PM [phone]

## 2016-06-03 NOTE — Progress Notes (Signed)
Post hd assessment 

## 2016-06-03 NOTE — Progress Notes (Signed)
Hd start 

## 2016-06-04 DIAGNOSIS — N189 Chronic kidney disease, unspecified: Secondary | ICD-10-CM

## 2016-06-04 LAB — PHOSPHORUS: PHOSPHORUS: 4.2 mg/dL (ref 2.5–4.6)

## 2016-06-04 LAB — HEPATITIS B SURFACE ANTIGEN: Hepatitis B Surface Ag: NEGATIVE

## 2016-06-04 LAB — HEPATITIS B SURFACE ANTIBODY,QUALITATIVE: HEP B S AB: NONREACTIVE

## 2016-06-04 NOTE — Progress Notes (Signed)
[facility]  ROUNDING NOTE   Subjective:  Patient seen and evaluated during 3rd HD treatment. Tolerating well at the moment. Hemoglobin up to 8.8.  Objective:  Vital signs in last 24 hours:  Temp:  [97.5 F (36.4 C)-98.7 F (37.1 C)] 98.2 F (36.8 C) (12/02 1015) Pulse Rate:  [63-82] 80 (12/02 1230) Resp:  [10-26] 15 (12/02 1230) BP: (106-171)/(65-114) 137/91 (12/02 1230) SpO2:  [94 %-100 %] 100 % (12/02 1230) Weight:  [58.7 kg (129 lb 6.6 oz)-62.4 kg (137 lb 9.1 oz)] 62.4 kg (137 lb 9.1 oz) (12/02 1015)  Weight change: -4.821 kg (-10 lb 10.1 oz) Filed Weights   06/03/16 2045 06/04/16 0652 06/04/16 1015  Weight: 58.7 kg (129 lb 6.6 oz) 59.2 kg (130 lb 8.2 oz) 62.4 kg (137 lb 9.1 oz)    Intake/Output: I/O last 3 completed shifts: In: 1601.9 [P.O.:1080; I.V.:171.9; Blood:350] Out: 850 [Other:850]   Intake/Output this shift:  Total I/O In: 360 [P.O.:360] Out: -   Physical Exam: General: No acute distress  Head: Normocephalic, atraumatic. Moist oral mucosal membranes  Eyes: Anicteric  Neck: Supple, trachea midline  Lungs:  Basilar rales, normal effort  Heart: S1S2 no rubs  Abdomen:  Distended, bowel sounds present  Extremities: trace peripheral edema.  Neurologic: Nonfocal, moving all four extremities  Skin: No lesions  Access: none    Basic Metabolic Panel:  Recent Labs Lab 05/31/16 1334 06/01/16 0542 06/03/16 0507 06/04/16 0954  NA 136 138 136  --   K 4.4 4.5 4.3  --   CL 111 115* 107  --   CO2 12* 12* 15*  --   GLUCOSE 101* 104* 124*  --   BUN 48* 50* 47*  --   CREATININE 5.99* 6.05* 6.36*  --   CALCIUM 8.6* 8.4* 8.8*  --   PHOS  --   --  5.8* 4.2    Liver Function Tests: No results for input(s): AST, ALT, ALKPHOS, BILITOT, PROT, ALBUMIN in the last 168 hours. No results for input(s): LIPASE, AMYLASE in the last 168 hours. No results for input(s): AMMONIA in the last 168 hours.  CBC:  Recent Labs Lab 05/31/16 1334  06/01/16 0542 06/02/16 0529 06/03/16 0507  WBC 5.4 4.1  --  4.7  HGB 7.9* 7.0* 6.5* 8.8*  HCT 23.3* 20.7*  --  25.8*  MCV 80.1 79.3*  --  79.8*  PLT 410 332  --  307    Cardiac Enzymes: No results for input(s): CKTOTAL, CKMB, CKMBINDEX, TROPONINI in the last 168 hours.  BNP: Invalid input(s): POCBNP  CBG: No results for input(s): GLUCAP in the last 168 hours.  Microbiology: No results found for this or any previous visit.  Coagulation Studies: No results for input(s): LABPROT, INR in the last 72 hours.  Urinalysis: No results for input(s): COLORURINE, LABSPEC, PHURINE, GLUCOSEU, HGBUR, BILIRUBINUR, KETONESUR, PROTEINUR, UROBILINOGEN, NITRITE, LEUKOCYTESUR in the last 72 hours.  Invalid input(s): APPERANCEUR    Imaging: [place] Vein Mapping  Result Date: 06/03/2016 CLINICAL DATA:  65 year old female with end-stage renal disease in need of hemodialysis. Upper extremity vascular mapping study. EXAM: UPPER EXTREMITY VASCULAR MAPPING STUDY COMPARISON:  None. FINDINGS: Arterial: Radial artery: Patent. The spectral Doppler waveform is monophasic but demonstrates a crisp systolic upstroke. No evidence of parvus et tardus waveform to suggest proximal stenosis. The artery measures 2 mm in diameter at the wrist. Ulnar artery: Spectral Doppler waveform is monophasic but demonstrates a crisp systolic upstroke. No evidence of parvus et tardus  waveform to suggest proximal stenosis. Measures 2 mm at the wrist. Brachial artery: Spectral Doppler waveform is monophasic but demonstrates a crisp systolic upstroke. No evidence of parvus et tardus waveform to suggest proximal stenosis. The artery measures 5 mm at the antecubital fossa. Venous: Cephalic vein: Cephalic vein is small measuring less than 2 mm in the upper arm. Basilic vein: The basilic vein is also relatively small and exists as a multiple small branches measuring only 2 mm in the proximal upper arm, and less than 2 mm in the distal upper arm.  Brachial vein: Robust brachial vein measuring between 4 and 6 mm throughout the upper arm. No evidence of DVT. Subclavian vein: Normal respiratory phasicity. The vessels are patent and there is no evidence of central obstruction or stenosis. Left internal jugular vein: Patent with normal spectral waveform. IMPRESSION: 1. Left upper extremity vascular mapping study as detailed above. 2. The basilic and cephalic veins are small (less than 2 mm). Signed, Sterling BigHeath K. McCullough, MD [facility] Electronically Signed   By: Malachy MoanHeath  McCullough M.D.   On: 06/03/2016 13:32     Medications:   . furosemide (LASIX) infusion 5 mg/hr (06/03/16 1400)   . amLODipine  10 mg Oral Daily  . aspirin EC  81 mg Oral Daily  . epoetin (EPOGEN/PROCRIT) injection  10,000 Units Intravenous Q T,Th,Sa-HD  . heparin  5,000 Units Subcutaneous Q8H  . hydrALAZINE  25 mg Oral Q8H  . labetalol  200 mg Oral q morning - 10a  . latanoprost  1 drop Both Eyes QHS  . ondansetron (ZOFRAN) IV  4 mg Intravenous Q6H  . pantoprazole  40 mg Oral Daily   sodium chloride, sodium chloride, acetaminophen, alteplase, alum & mag hydroxide-simeth, heparin, hydrALAZINE, HYDROcodone-acetaminophen, lidocaine (PF), lidocaine-prilocaine, morphine injection, pentafluoroprop-tetrafluoroeth  Assessment/ Plan:  65 y.o. African American female with past medical history of chronic kidney disease stage V, anemia chronic kidney disease, hypertension, secondary hyperparathyroidism, history of gastric ulcer, history of pacemaker placement, polycystic kidney disease, history of TIA, vertebral artery aneurysm status post coiling, history of hyperkalemia, and metabolic acidosis   1. End-stage renal disease secondary to polycystic kidney disease: The patient is clearly in need of renal replacement therapy at this time. She has needed renal placement therapy since at least July of this past year. Patient has been  lost to follow-up since July. PermCath placed 06/02/2016. - Patient seen and evaluated during third dialysis treatment. She appears to be tolerating quite well. Ultrafiltration target 1.5 kg today. Outpatient dialysis placement ongoing.  2. Metabolic acidosis. No new serum bicarbonate today. However metabolic acidosis should be close to being completely corrected after today's dialysis.  3. Anemia chronic kidney disease. Patient to receive Epogen 10,000 units IV with dialysis today.  4. Secondary hyperparathyroidism. Phosphorus down to 4.2 now. Continue to monitor.  5.  Pericardial effusion.  Likely secondary to uremia.  This will be treated slowly with dialysis.    LOS: 4 Tylek Boney 12/2/201712:41 PM

## 2016-06-04 NOTE — Progress Notes (Signed)
Pre Dialysis 

## 2016-06-04 NOTE — Progress Notes (Signed)
Dialysis started 

## 2016-06-04 NOTE — Progress Notes (Signed)
[facility]  Daily Progress Note   Subjective: 2 Days Post-Op: Ultrasound guidance for vascular access to the right internal jugular vein, fluoroscopic guidance for placement of catheter, placement of a 19 cm tip to cuff tunneled hemodialysis catheter via the right internal jugular vein for ESRD  Patient is without complaint. No issues with dialysis using catheter. Site healing well. No drainage for swelling noted. If patient will need permanent access would consider vein mapping while in hospital .   Objective: Vitals:   06/04/16 1300 06/04/16 1330 06/04/16 1400 06/04/16 1407  BP: 123/77 133/85 (!) 148/85 (!) 151/93  Pulse: 80 70 71 71  Resp: 14 13 15 12   Temp:    98.3 F (36.8 C)  TempSrc:    Oral  SpO2: 100% 100% 100% 100%  Weight:    134 lb 4.2 oz (60.9 kg)  Height:        Intake/Output Summary (Last 24 hours) at 06/04/16 1443 Last data filed at 06/04/16 1407  Gross per 24 hour  Intake           436.38 ml  Output             2000 ml  Net         -1563.62 ml   Physical Exam: A&Ox3, NAD Neck: Right Catheter - intact, clean and dry. No swelling or drainage. CV: RRR Pulmonary: CTA Bilaterally Abdomen: Soft, Nontender, Nondistended   Laboratory: CBC    Component Value Date/Time   WBC 4.7 06/03/2016 0507   HGB 8.8 (L) 06/03/2016 0507   HGB 8.8 (L) 10/09/2014 1407   HCT 25.8 (L) 06/03/2016 0507   HCT 28.3 (L) 05/22/2014 1412   PLT 307 06/03/2016 0507   PLT 236 05/22/2014 1412   BMET    Component Value Date/Time   NA 136 06/03/2016 0507   NA 141 01/30/2014 1505   K 4.3 06/03/2016 0507   K 5.0 01/30/2014 1505   CL 107 06/03/2016 0507   CL 116 (H) 01/30/2014 1505   CO2 15 (L) 06/03/2016 0507   CO2 17 (L) 01/30/2014 1505   GLUCOSE 124 (H) 06/03/2016 0507   GLUCOSE 93 01/30/2014 1505   BUN 47 (H) 06/03/2016 0507   BUN 50 (H) 06/19/2014 1413   CREATININE 6.36 (H) 06/03/2016 0507   CREATININE 3.33 (H) 06/19/2014 1413   CALCIUM 8.8 (L)  06/03/2016 0507   CALCIUM 8.8 01/30/2014 1505   GFRNONAA 6 (L) 06/03/2016 0507   GFRNONAA 15 (L) 06/19/2014 1413   GFRNONAA 14 (L) 01/30/2014 1505   GFRAA 7 (L) 06/03/2016 0507   GFRAA 18 (L) 06/19/2014 1413   GFRAA 16 (L) 01/30/2014 1505   Assessment/Planning: 65 year old female now with ESRD requiring dialysis. Permcath placed on 06/02/16 - Stable 1) Permcath functioning appropriately.  2) Patient will need permanent access - would recommend vein mapping while inpatient. 3) Patient to follow up with us as outpatient for permanent placement of access upon discharge.  Cleda DaubKimberly Stegmayer PA-C 06/04/2016 2:43 PM

## 2016-06-04 NOTE — Progress Notes (Signed)
Dialysis complete

## 2016-06-04 NOTE — Progress Notes (Signed)
Pre dialysis  

## 2016-06-04 NOTE — Progress Notes (Signed)
[facility] [place] [facility]   PATIENT NAME: Melissa PortelaCaroline Bradshaw    MRN#:  [id number]  DATE OF BIRTH:  07/11/1950  SUBJECTIVE:  Hospital Day: 4 days Melissa PortelaCaroline Bradshaw is a 65 y.o. female presenting with Leg Swelling .   Overnight events: no overnight events Interval Events: no complaints " its the best i've felt in 2 years"  REVIEW OF SYSTEMS:  CONSTITUTIONAL: No fever, fatigue or weakness.  EYES: No blurred or double vision.  EARS, NOSE, AND THROAT: No tinnitus or ear pain.  RESPIRATORY: No cough, shortness of breath, wheezing or hemoptysis.  CARDIOVASCULAR: No chest pain, orthopnea, edema.  GASTROINTESTINAL: No nausea, vomiting, diarrhea or abdominal pain.  GENITOURINARY: No dysuria, hematuria.  ENDOCRINE: No polyuria, nocturia,  HEMATOLOGY: No anemia, easy bruising or bleeding SKIN: No rash or lesion. MUSCULOSKELETAL: No joint pain or arthritis.   NEUROLOGIC: No tingling, numbness, weakness.  PSYCHIATRY: No anxiety or depression.   DRUG ALLERGIES:   Allergies  Allergen Reactions  . Iodine Other (See Comments)    Unknown reaction  . Ivp Dye [Iodinated Diagnostic Agents] Rash    VITALS:  Blood pressure 122/90, pulse 71, temperature 98.2 F (36.8 C), temperature source Oral, resp. rate 12, height 5\' 5"  (1.651 m), weight 62.4 kg (137 lb 9.1 oz), SpO2 100 %.  PHYSICAL EXAMINATION:  VITAL SIGNS: Vitals:   06/04/16 1130 06/04/16 1200  BP: 109/71 122/90  Pulse: 69 71  Resp: 20 12  Temp:     GENERAL:65 y.o.female currently in no acute distress.  HEAD: Normocephalic, atraumatic.  EYES: Pupils equal, round, reactive to light. Extraocular muscles intact. No scleral icterus.  MOUTH: Moist mucosal membrane. Dentition intact. No abscess noted.  EAR, NOSE, THROAT: Clear without exudates. No external lesions.  NECK: Supple. No thyromegaly. No nodules. No JVD.  PULMONARY: Clear to ascultation, without wheeze rails or rhonci. No use of accessory  muscles, Good respiratory effort. good air entry bilaterally CHEST: Nontender to palpation.  CARDIOVASCULAR: S1 and S2. Regular rate and rhythm. No murmurs, rubs, or gallops. No edema. Pedal pulses 2+ bilaterally.  GASTROINTESTINAL: Soft, nontender, distended. No masses. Positive bowel sounds. No hepatosplenomegaly.  MUSCULOSKELETAL: No swelling, clubbing, or edema. Range of motion full in all extremities.  NEUROLOGIC: Cranial nerves II through XII are intact. No gross focal neurological deficits. Sensation intact. Reflexes intact.  SKIN: No ulceration, lesions, rashes, or cyanosis. Skin warm and dry. Turgor intact.  PSYCHIATRIC: Mood, affect within normal limits. The patient is awake, alert and oriented x 3. Insight, judgment intact.      LABORATORY PANEL:   CBC  Recent Labs Lab 06/03/16 0507  WBC 4.7  HGB 8.8*  HCT 25.8*  PLT 307   ------------------------------------------------------------------------------------------------------------------  Chemistries   Recent Labs Lab 06/03/16 0507  NA 136  K 4.3  CL 107  CO2 15*  GLUCOSE 124*  BUN 47*  CREATININE 6.36*  CALCIUM 8.8*   ------------------------------------------------------------------------------------------------------------------  Cardiac Enzymes No results for input(s): TROPONINI in the last 168 hours. ------------------------------------------------------------------------------------------------------------------  RADIOLOGY:  [place] Vein Mapping  Result Date: 06/03/2016 CLINICAL DATA:  65 year old female with end-stage renal disease in need of hemodialysis. Upper extremity vascular mapping study. EXAM: UPPER EXTREMITY VASCULAR MAPPING STUDY COMPARISON:  None. FINDINGS: Arterial: Radial artery: Patent. The spectral Doppler waveform is monophasic but demonstrates a crisp systolic upstroke. No evidence of parvus et tardus waveform to suggest proximal stenosis. The artery measures 2 mm in diameter at the  wrist. Ulnar artery: Spectral Doppler waveform is  monophasic but demonstrates a crisp systolic upstroke. No evidence of parvus et tardus waveform to suggest proximal stenosis. Measures 2 mm at the wrist. Brachial artery: Spectral Doppler waveform is monophasic but demonstrates a crisp systolic upstroke. No evidence of parvus et tardus waveform to suggest proximal stenosis. The artery measures 5 mm at the antecubital fossa. Venous: Cephalic vein: Cephalic vein is small measuring less than 2 mm in the upper arm. Basilic vein: The basilic vein is also relatively small and exists as a multiple small branches measuring only 2 mm in the proximal upper arm, and less than 2 mm in the distal upper arm. Brachial vein: Robust brachial vein measuring between 4 and 6 mm throughout the upper arm. No evidence of DVT. Subclavian vein: Normal respiratory phasicity. The vessels are patent and there is no evidence of central obstruction or stenosis. Left internal jugular vein: Patent with normal spectral waveform. IMPRESSION: 1. Left upper extremity vascular mapping study as detailed above. 2. The basilic and cephalic veins are small (less than 2 mm). Signed, Sterling BigHeath K. McCullough, MD [facility] Electronically Signed   By: Malachy MoanHeath  McCullough M.D.   On: 06/03/2016 13:32    EKG:   Orders placed or performed during the hospital encounter of 05/31/16  . ED EKG  . ED EKG    ASSESSMENT AND PLAN:   Melissa PortelaCaroline Bradshaw is a 65 y.o. female presenting with Leg Swelling . Admitted 05/31/2016 : Day #: 4 days  1. Acute kidney injury on chronic kidney disease now end-stage renal disease on dialysis continue dialysis per nephrology 2. Anemia of chronic disease continue Epogen  Disposition physical therapy Case management working on outpatient dialysis  All the records are reviewed and case discussed with Care Management/Social Workerr. Management plans discussed with the  patient, family and they are in agreement.  CODE STATUS: full TOTAL TIME TAKING CARE OF THIS PATIENT: 28 minutes.   POSSIBLE D/C IN 1-2DAYS, DEPENDING ON CLINICAL CONDITION.   Ameera Tigue,  Mardi MainlandDavid K M.D on 06/04/2016 at 12:13 PM  Between 7am to 6pm - Pager - [phone]  After 6pm: House Pager: - [phone]  [facility]  Office  [phone]  CC: Primary care physician; Corky DownsMASOUD,JAVED, MD

## 2016-06-04 NOTE — Progress Notes (Signed)
Post dialysis 

## 2016-06-05 ENCOUNTER — Inpatient Hospital Stay: Payer: Medicare Other

## 2016-06-05 LAB — PHOSPHORUS: PHOSPHORUS: 2.4 mg/dL — AB (ref 2.5–4.6)

## 2016-06-05 LAB — HEPATITIS B CORE ANTIBODY, TOTAL: Hep B Core Total Ab: NEGATIVE

## 2016-06-05 NOTE — Progress Notes (Signed)
PRE DIALYSIS ASSESSMENT 

## 2016-06-05 NOTE — Progress Notes (Signed)
[facility]  ROUNDING NOTE   Subjective:  Patient seen and evaluated during hemodialysis. Overall feeling well. Still has distended abdomen. We have discussed obtaining CT scan of the abdomen and pelvis after dialysis today.  Objective:  Vital signs in last 24 hours:  Temp:  [97.9 F (36.6 C)-99.3 F (37.4 C)] 99.3 F (37.4 C) (12/03 1030) Pulse Rate:  [62-84] 78 (12/03 1300) Resp:  [12-16] 16 (12/03 0527) BP: (108-160)/(60-93) 142/81 (12/03 1300) SpO2:  [98 %-100 %] 98 % (12/03 1030) Weight:  [58.3 kg (128 lb 8.5 oz)-62.2 kg (137 lb 2 oz)] 62.2 kg (137 lb 2 oz) (12/03 1030)  Weight change: 3.4 kg (7 lb 7.9 oz) Filed Weights   06/05/16 0500 06/05/16 0527 06/05/16 1030  Weight: 58.3 kg (128 lb 8.5 oz) 58.5 kg (128 lb 15.5 oz) 62.2 kg (137 lb 2 oz)    Intake/Output: I/O last 3 completed shifts: In: 505.4 [P.O.:360; I.V.:145.4] Out: 2000 [Other:2000]   Intake/Output this shift:  Total I/O In: 120 [P.O.:120] Out: 0   Physical Exam: General: No acute distress  Head: Normocephalic, atraumatic. Moist oral mucosal membranes  Eyes: Anicteric  Neck: Supple, trachea midline  Lungs:  CTAB, normal effort  Heart: S1S2 no rubs  Abdomen:  Distended, bowel sounds present  Extremities: trace peripheral edema.  Neurologic: Nonfocal, moving all four extremities  Skin: No lesions  Access: R IJ permcath    Basic Metabolic Panel:  Recent Labs Lab 05/31/16 1334 06/01/16 0542 06/03/16 0507 06/04/16 0954 06/05/16 0957  NA 136 138 136  --   --   K 4.4 4.5 4.3  --   --   CL 111 115* 107  --   --   CO2 12* 12* 15*  --   --   GLUCOSE 101* 104* 124*  --   --   BUN 48* 50* 47*  --   --   CREATININE 5.99* 6.05* 6.36*  --   --   CALCIUM 8.6* 8.4* 8.8*  --   --   PHOS  --   --  5.8* 4.2 2.4*    Liver Function Tests: No results for input(s): AST, ALT, ALKPHOS, BILITOT, PROT, ALBUMIN in the last 168 hours. No results for input(s): LIPASE, AMYLASE in the last 168  hours. No results for input(s): AMMONIA in the last 168 hours.  CBC:  Recent Labs Lab 05/31/16 1334 06/01/16 0542 06/02/16 0529 06/03/16 0507  WBC 5.4 4.1  --  4.7  HGB 7.9* 7.0* 6.5* 8.8*  HCT 23.3* 20.7*  --  25.8*  MCV 80.1 79.3*  --  79.8*  PLT 410 332  --  307    Cardiac Enzymes: No results for input(s): CKTOTAL, CKMB, CKMBINDEX, TROPONINI in the last 168 hours.  BNP: Invalid input(s): POCBNP  CBG: No results for input(s): GLUCAP in the last 168 hours.  Microbiology: No results found for this or any previous visit.  Coagulation Studies: No results for input(s): LABPROT, INR in the last 72 hours.  Urinalysis: No results for input(s): COLORURINE, LABSPEC, PHURINE, GLUCOSEU, HGBUR, BILIRUBINUR, KETONESUR, PROTEINUR, UROBILINOGEN, NITRITE, LEUKOCYTESUR in the last 72 hours.  Invalid input(s): APPERANCEUR    Imaging: No results found.   Medications:   . furosemide (LASIX) infusion 5 mg/hr (06/04/16 1826)   . amLODipine  10 mg Oral Daily  . aspirin EC  81 mg Oral Daily  . epoetin (EPOGEN/PROCRIT) injection  10,000 Units Intravenous Q T,Th,Sa-HD  . heparin  5,000 Units Subcutaneous Q8H  .  hydrALAZINE  25 mg Oral Q8H  . labetalol  200 mg Oral q morning - 10a  . latanoprost  1 drop Both Eyes QHS  . ondansetron (ZOFRAN) IV  4 mg Intravenous Q6H  . pantoprazole  40 mg Oral Daily   sodium chloride, sodium chloride, acetaminophen, alteplase, alum & mag hydroxide-simeth, heparin, hydrALAZINE, HYDROcodone-acetaminophen, lidocaine (PF), lidocaine-prilocaine, morphine injection, pentafluoroprop-tetrafluoroeth  Assessment/ Plan:  65 y.o. African American female with past medical history of chronic kidney disease stage V, anemia chronic kidney disease, hypertension, secondary hyperparathyroidism, history of gastric ulcer, history of pacemaker placement, polycystic kidney disease, history of TIA, vertebral artery aneurysm status post coiling, history of hyperkalemia, and  metabolic acidosis   1. End-stage renal disease secondary to polycystic kidney disease: The patient is clearly in need of renal replacement therapy at this time. She has needed renal placement therapy since at least July of this past year. Patient has been lost to follow-up since July. PermCath placed 06/02/2016. - Patient seen and evaluated during hemodialysis. Catheter appears to be functioning well. Vein mapping completed. Ultimately she would like to perform peritoneal dialysis however we will need to determine cyst burden from her underlying polycystic kidney disease. In addition she previous had cystic liver disease. We will obtain CT scan of the abdomen and pelvis to further evaluate.  2. Metabolic acidosis. Acidosis has likely improved. Last serum bicarbonate was on December 1. Recommend following up electrolytes with next dialysis treatment.  3. Anemia chronic kidney disease. Last hemoglobin 8.8. Continue Epogen 10,000 units IV with dialysis.  4. Secondary hyperparathyroidism. Phosphorus down significantly with consecutive days of dialysis. Phosphorus currently 2.4. No need for binders at the moment.  5.  Pericardial effusion.  Likely secondary to uremia.  This will be treated slowly with dialysis.    LOS: 5 Melissa Bradshaw 12/3/20171:13 PM

## 2016-06-05 NOTE — Progress Notes (Signed)
POST DIALYSIS ASSESSMENT 

## 2016-06-05 NOTE — Progress Notes (Signed)
HD STARTED  

## 2016-06-05 NOTE — Care Management Important Message (Signed)
Important Message  Patient Details  Name: Melissa SchroederCaroline J Bradshaw MRN: [id number] Date of Birth: 10/13/1950   Medicare Important Message Given:  Yes    Vontae Court A, RN 06/05/2016, 4:18 PM

## 2016-06-05 NOTE — Progress Notes (Signed)
[facility] [place] at [facility]   PATIENT NAME: Melissa Bradshaw    MRN#:  [id number]  DATE OF BIRTH:  12/30/1950  SUBJECTIVE:  Hospital Day: 5 days Melissa PortelaCaroline Hora is a 65 y.o. female presenting with Leg Swelling .   Overnight events: no overnight events Interval Events: no complaints  REVIEW OF SYSTEMS:  CONSTITUTIONAL: No fever, fatigue or weakness.  EYES: No blurred or double vision.  EARS, NOSE, AND THROAT: No tinnitus or ear pain.  RESPIRATORY: No cough, shortness of breath, wheezing or hemoptysis.  CARDIOVASCULAR: No chest pain, orthopnea, edema.  GASTROINTESTINAL: No nausea, vomiting, diarrhea or abdominal pain.  GENITOURINARY: No dysuria, hematuria.  ENDOCRINE: No polyuria, nocturia,  HEMATOLOGY: No anemia, easy bruising or bleeding SKIN: No rash or lesion. MUSCULOSKELETAL: No joint pain or arthritis.   NEUROLOGIC: No tingling, numbness, weakness.  PSYCHIATRY: No anxiety or depression.   DRUG ALLERGIES:   Allergies  Allergen Reactions  . Iodine Other (See Comments)    Unknown reaction  . Ivp Dye [Iodinated Diagnostic Agents] Rash    VITALS:  Blood pressure 122/77, pulse 84, temperature 99.3 F (37.4 C), temperature source Oral, resp. rate 16, height 5\' 5"  (1.651 m), weight 62.2 kg (137 lb 2 oz), SpO2 98 %.  PHYSICAL EXAMINATION:  VITAL SIGNS: Vitals:   06/05/16 1130 06/05/16 1200  BP: 118/80 122/77  Pulse: 82 84  Resp:    Temp:     GENERAL:65 y.o.female currently in no acute distress.  HEAD: Normocephalic, atraumatic.  EYES: Pupils equal, round, reactive to light. Extraocular muscles intact. No scleral icterus.  MOUTH: Moist mucosal membrane. Dentition intact. No abscess noted.  EAR, NOSE, THROAT: Clear without exudates. No external lesions.  NECK: Supple. No thyromegaly. No nodules. No JVD.  PULMONARY: Clear to ascultation, without wheeze rails or rhonci. No use of accessory muscles, Good respiratory effort. good air entry  bilaterally CHEST: Nontender to palpation.  CARDIOVASCULAR: S1 and S2. Regular rate and rhythm. No murmurs, rubs, or gallops. No edema. Pedal pulses 2+ bilaterally.  GASTROINTESTINAL: Soft, nontender, distended. No masses. Positive bowel sounds. No hepatosplenomegaly.  MUSCULOSKELETAL: No swelling, clubbing, or edema. Range of motion full in all extremities.  NEUROLOGIC: Cranial nerves II through XII are intact. No gross focal neurological deficits. Sensation intact. Reflexes intact.  SKIN: No ulceration, lesions, rashes, or cyanosis. Skin warm and dry. Turgor intact.  PSYCHIATRIC: Mood, affect within normal limits. The patient is awake, alert and oriented x 3. Insight, judgment intact.      LABORATORY PANEL:   CBC  Recent Labs Lab 06/03/16 0507  WBC 4.7  HGB 8.8*  HCT 25.8*  PLT 307   ------------------------------------------------------------------------------------------------------------------  Chemistries   Recent Labs Lab 06/03/16 0507  NA 136  K 4.3  CL 107  CO2 15*  GLUCOSE 124*  BUN 47*  CREATININE 6.36*  CALCIUM 8.8*   ------------------------------------------------------------------------------------------------------------------  Cardiac Enzymes No results for input(s): TROPONINI in the last 168 hours. ------------------------------------------------------------------------------------------------------------------  RADIOLOGY:  No results found.  EKG:   Orders placed or performed during the hospital encounter of 05/31/16  . ED EKG  . ED EKG    ASSESSMENT AND PLAN:   Melissa PortelaCaroline Allard is a 65 y.o. female presenting with Leg Swelling . Admitted 05/31/2016 : Day #: 5 days  1. Acute kidney injury on chronic kidney disease now end-stage renal disease on dialysis continue dialysis per nephrology 2. Anemia of chronic disease continue Epogen  Disposition physical therapy Case management working on outpatient dialysis  All the records are reviewed  and case discussed with Care Management/Social Workerr. Management plans discussed with the patient, family and they are in agreement.  CODE STATUS: full TOTAL TIME TAKING CARE OF THIS PATIENT: 28 minutes.   POSSIBLE D/C IN 1-2DAYS, DEPENDING ON CLINICAL CONDITION.   Hower,  Mardi MainlandDavid K M.D on 06/05/2016 at 12:21 PM  Between 7am to 6pm - Pager - [phone]  After 6pm: House Pager: - [phone]  [facility]  Office  [phone]  CC: Primary care physician; Corky DownsMASOUD,JAVED, MD

## 2016-06-06 LAB — BASIC METABOLIC PANEL
Anion gap: 10 (ref 5–15)
BUN: 19 mg/dL (ref 6–20)
CHLORIDE: 100 mmol/L — AB (ref 101–111)
CO2: 27 mmol/L (ref 22–32)
CREATININE: 2.98 mg/dL — AB (ref 0.44–1.00)
Calcium: 8.1 mg/dL — ABNORMAL LOW (ref 8.9–10.3)
GFR calc Af Amer: 18 mL/min — ABNORMAL LOW (ref >60)
GFR calc non Af Amer: 15 mL/min — ABNORMAL LOW (ref >60)
GLUCOSE: 155 mg/dL — AB (ref 65–99)
Potassium: 3.6 mmol/L (ref 3.5–5.1)
SODIUM: 137 mmol/L (ref 135–145)

## 2016-06-06 MED ORDER — FUROSEMIDE 10 MG/ML IJ SOLN: 5.0000 mg/h | INTRAVENOUS | Status: AC

## 2016-06-06 NOTE — Progress Notes (Signed)
[id number] [place] at [facility]   PATIENT NAME: Melissa PortelaCaroline Bradshaw    MRN#:  [id number]  DATE OF BIRTH:  12/11/1950  SUBJECTIVE:  Hospital Day: 6 days Melissa Bradshaw is a 65 y.o. female presenting with Leg Swelling .   Overnight events: no overnight events Interval Events: no complaints  REVIEW OF SYSTEMS:  CONSTITUTIONAL: No fever, fatigue or weakness.  EYES: No blurred or double vision.  EARS, NOSE, AND THROAT: No tinnitus or ear pain.  RESPIRATORY: No cough, shortness of breath, wheezing or hemoptysis.  CARDIOVASCULAR: No chest pain, orthopnea, edema.  GASTROINTESTINAL: No nausea, vomiting, diarrhea or abdominal pain.  GENITOURINARY: No dysuria, hematuria.  ENDOCRINE: No polyuria, nocturia,  HEMATOLOGY: No anemia, easy bruising or bleeding SKIN: No rash or lesion. MUSCULOSKELETAL: No joint pain or arthritis.   NEUROLOGIC: No tingling, numbness, weakness.  PSYCHIATRY: No anxiety or depression.   DRUG ALLERGIES:   Allergies  Allergen Reactions  . Iodine Other (See Comments)    Unknown reaction  . Ivp Dye [Iodinated Diagnostic Agents] Rash    VITALS:  Blood pressure 119/72, pulse 68, temperature 98.6 F (37 C), temperature source Oral, resp. rate 16, height 5\' 5"  (1.651 m), weight 62.2 kg (137 lb 2 oz), SpO2 95 %.  PHYSICAL EXAMINATION:  VITAL SIGNS: Vitals:   06/06/16 0004 06/06/16 1137  BP: 120/66 119/72  Pulse: (!) 59 68  Resp:    Temp:     GENERAL:65 y.o.female currently in no acute distress.  HEAD: Normocephalic, atraumatic.  EYES: Pupils equal, round, reactive to light. Extraocular muscles intact. No scleral icterus.  MOUTH: Moist mucosal membrane. Dentition intact. No abscess noted.  EAR, NOSE, THROAT: Clear without exudates. No external lesions.  NECK: Supple. No thyromegaly. No nodules. No JVD.  PULMONARY: Clear to ascultation, without wheeze rails or rhonci. No use of accessory muscles, Good respiratory effort. good air entry  bilaterally CHEST: Nontender to palpation.  CARDIOVASCULAR: S1 and S2. Regular rate and rhythm. No murmurs, rubs, or gallops. No edema. Pedal pulses 2+ bilaterally.  GASTROINTESTINAL: Soft, nontender, distended. No masses. Positive bowel sounds. No hepatosplenomegaly.  MUSCULOSKELETAL: No swelling, clubbing, or edema. Range of motion full in all extremities.  NEUROLOGIC: Cranial nerves II through XII are intact. No gross focal neurological deficits. Sensation intact. Reflexes intact.  SKIN: No ulceration, lesions, rashes, or cyanosis. Skin warm and dry. Turgor intact.  PSYCHIATRIC: Mood, affect within normal limits. The patient is awake, alert and oriented x 3. Insight, judgment intact.      LABORATORY PANEL:   CBC  Recent Labs Lab 06/03/16 0507  WBC 4.7  HGB 8.8*  HCT 25.8*  PLT 307   ------------------------------------------------------------------------------------------------------------------  Chemistries   Recent Labs Lab 06/06/16 0934  NA 137  K 3.6  CL 100*  CO2 27  GLUCOSE 155*  BUN 19  CREATININE 2.98*  CALCIUM 8.1*   ------------------------------------------------------------------------------------------------------------------  Cardiac Enzymes No results for input(s): TROPONINI in the last 168 hours. ------------------------------------------------------------------------------------------------------------------  RADIOLOGY:  Ct Abdomen Pelvis Wo Contrast  Result Date: 06/05/2016 CLINICAL DATA:  Abdominal distention. Known polycystic kidney and liver disease. EXAM: CT ABDOMEN AND PELVIS WITHOUT CONTRAST TECHNIQUE: Multidetector CT imaging of the abdomen and pelvis was performed following the standard protocol without IV contrast. COMPARISON:  Abdomen pelvis CT dated 06/19/2015 and limited right upper quadrant abdomen ultrasound dated 05/09/2016. FINDINGS: Lower chest: Mild bilateral lower lobe atelectasis. Minimal bilateral pleural effusions.  Pericardial effusion measuring 1.9 cm in maximum thickness. Intracardiac pacemaker leads. Hepatobiliary: A large  number of liver cysts are again demonstrated. The gallbladder is difficult to distinguish from the liver cysts. Pancreas: Difficult to resolve due to a lack of intraperitoneal and retroperitoneal fat. Grossly normal. Spleen: Normal in size without focal abnormality. Adrenals/Urinary Tract: The kidneys remain diffusely enlarged and almost completely replaced by cysts, including small hemorrhagic cysts. The adrenal glands are not well visualized. Unremarkable urinary bladder. No visible urinary tract calculi or hydronephrosis. There are intrarenal vascular calcifications. Stomach/Bowel: Mild sigmoid colon diverticulosis without evidence of diverticulitis. High density material in a normal appearing appendix. No gastric or small bowel abnormalities are seen. Vascular/Lymphatic: Extensive arterial calcifications, including the abdominal aorta. No visible enlarged lymph nodes. Reproductive: Uterus and bilateral adnexa are unremarkable. Other: Large amount of ascites with anterior protrusion of the umbilicus. Musculoskeletal: Mild lumbar and lower thoracic spine degenerative changes with more pronounced degenerative changes at the L5-S1 level. IMPRESSION: 1. Large amount of ascites. 2. Previously demonstrated polycystic liver and kidneys. 3. Mild sigmoid diverticulosis. 4. Minimal bilateral pleural effusions. 5. Moderate-sized pericardial effusion. 6. Mild bilateral lower lobe atelectasis. Electronically Signed   By: Beckie SaltsSteven  Reid M.D.   On: 06/05/2016 15:10    EKG:   Orders placed or performed during the hospital encounter of 05/31/16  . ED EKG  . ED EKG    ASSESSMENT AND PLAN:   Melissa Bradshaw is a 65 y.o. female presenting with Leg Swelling . Admitted 05/31/2016 : Day #: 6 days  1. Acute kidney injury on chronic kidney disease now end-stage renal disease on dialysis continue dialysis per  nephrology 2. Anemia of chronic disease continue Epogen  Disposition physical therapy Case management working on outpatient dialysis  All the records are reviewed and case discussed with Care Management/Social Workerr. Management plans discussed with the patient, family and they are in agreement.  CODE STATUS: full TOTAL TIME TAKING CARE OF THIS PATIENT: 28 minutes.   POSSIBLE D/C IN 1-2DAYS, DEPENDING ON CLINICAL CONDITION.   Jomel Whittlesey,  Mardi MainlandDavid K M.D on 06/06/2016 at 12:58 PM  Between 7am to 6pm - Pager - [phone]  After 6pm: House Pager: - [phone]  [facility]  Office  [phone]  CC: Primary care physician; Corky DownsMASOUD,JAVED, MD

## 2016-06-06 NOTE — Progress Notes (Signed)
[facility]  ROUNDING NOTE   Subjective:   Laying in bed. Completed three hemodialysis treatments.   Family at bedside.   On furosemide gtt.   Objective:  Vital signs in last 24 hours:  Temp:  [98.5 F (36.9 C)-99.8 F (37.7 C)] 98.6 F (37 C) (12/03 2033) Pulse Rate:  [59-90] 59 (12/04 0004) Resp:  [16-18] 16 (12/03 2033) BP: (103-153)/(63-89) 120/66 (12/04 0004) SpO2:  [95 %-97 %] 95 % (12/04 0004)  Weight change: -0.2 kg (-7.1 oz) Filed Weights   06/05/16 0500 06/05/16 0527 06/05/16 1030  Weight: 58.3 kg (128 lb 8.5 oz) 58.5 kg (128 lb 15.5 oz) 62.2 kg (137 lb 2 oz)    Intake/Output: I/O last 3 completed shifts: In: 417.9 [P.O.:120; I.V.:297.9] Out: 0    Intake/Output this shift:  Total I/O In: 240 [P.O.:240] Out: -   Physical Exam: General: No acute distress  Head: Normocephalic, atraumatic. Moist oral mucosal membranes  Eyes: Anicteric  Neck: Supple, trachea midline  Lungs:  CTAB, normal effort  Heart: S1S2 no rubs  Abdomen:  +organomegally, +distended, nontender  Extremities: No peripheral edema.  Neurologic: Nonfocal, moving all four extremities  Skin: No lesions  Access: R IJ permcath    Basic Metabolic Panel:  Recent Labs Lab 05/31/16 1334 06/01/16 0542 06/03/16 0507 06/04/16 0954 06/05/16 0957 06/06/16 0934  NA 136 138 136  --   --  137  K 4.4 4.5 4.3  --   --  3.6  CL 111 115* 107  --   --  100*  CO2 12* 12* 15*  --   --  27  GLUCOSE 101* 104* 124*  --   --  155*  BUN 48* 50* 47*  --   --  19  CREATININE 5.99* 6.05* 6.36*  --   --  2.98*  CALCIUM 8.6* 8.4* 8.8*  --   --  8.1*  PHOS  --   --  5.8* 4.2 2.4*  --     Liver Function Tests: No results for input(s): AST, ALT, ALKPHOS, BILITOT, PROT, ALBUMIN in the last 168 hours. No results for input(s): LIPASE, AMYLASE in the last 168 hours. No results for input(s): AMMONIA in the last 168 hours.  CBC:  Recent Labs Lab 05/31/16 1334 06/01/16 0542 06/02/16 0529  06/03/16 0507  WBC 5.4 4.1  --  4.7  HGB 7.9* 7.0* 6.5* 8.8*  HCT 23.3* 20.7*  --  25.8*  MCV 80.1 79.3*  --  79.8*  PLT 410 332  --  307    Cardiac Enzymes: No results for input(s): CKTOTAL, CKMB, CKMBINDEX, TROPONINI in the last 168 hours.  BNP: Invalid input(s): POCBNP  CBG: No results for input(s): GLUCAP in the last 168 hours.  Microbiology: No results found for this or any previous visit.  Coagulation Studies: No results for input(s): LABPROT, INR in the last 72 hours.  Urinalysis: No results for input(s): COLORURINE, LABSPEC, PHURINE, GLUCOSEU, HGBUR, BILIRUBINUR, KETONESUR, PROTEINUR, UROBILINOGEN, NITRITE, LEUKOCYTESUR in the last 72 hours.  Invalid input(s): APPERANCEUR    Imaging: Ct Abdomen Pelvis Wo Contrast  Result Date: 06/05/2016 CLINICAL DATA:  Abdominal distention. Known polycystic kidney and liver disease. EXAM: CT ABDOMEN AND PELVIS WITHOUT CONTRAST TECHNIQUE: Multidetector CT imaging of the abdomen and pelvis was performed following the standard protocol without IV contrast. COMPARISON:  Abdomen pelvis CT dated 06/19/2015 and limited right upper quadrant abdomen ultrasound dated 05/09/2016. FINDINGS: Lower chest: Mild bilateral lower lobe atelectasis. Minimal bilateral pleural effusions. Pericardial  effusion measuring 1.9 cm in maximum thickness. Intracardiac pacemaker leads. Hepatobiliary: A large number of liver cysts are again demonstrated. The gallbladder is difficult to distinguish from the liver cysts. Pancreas: Difficult to resolve due to a lack of intraperitoneal and retroperitoneal fat. Grossly normal. Spleen: Normal in size without focal abnormality. Adrenals/Urinary Tract: The kidneys remain diffusely enlarged and almost completely replaced by cysts, including small hemorrhagic cysts. The adrenal glands are not well visualized. Unremarkable urinary bladder. No visible urinary tract calculi or hydronephrosis. There are intrarenal vascular  calcifications. Stomach/Bowel: Mild sigmoid colon diverticulosis without evidence of diverticulitis. High density material in a normal appearing appendix. No gastric or small bowel abnormalities are seen. Vascular/Lymphatic: Extensive arterial calcifications, including the abdominal aorta. No visible enlarged lymph nodes. Reproductive: Uterus and bilateral adnexa are unremarkable. Other: Large amount of ascites with anterior protrusion of the umbilicus. Musculoskeletal: Mild lumbar and lower thoracic spine degenerative changes with more pronounced degenerative changes at the L5-S1 level. IMPRESSION: 1. Large amount of ascites. 2. Previously demonstrated polycystic liver and kidneys. 3. Mild sigmoid diverticulosis. 4. Minimal bilateral pleural effusions. 5. Moderate-sized pericardial effusion. 6. Mild bilateral lower lobe atelectasis. Electronically Signed   By: Beckie SaltsSteven  Reid M.D.   On: 06/05/2016 15:10     Medications:   . furosemide (LASIX) infusion 5 mg/hr (06/04/16 1826)   . amLODipine  10 mg Oral Daily  . aspirin EC  81 mg Oral Daily  . epoetin (EPOGEN/PROCRIT) injection  10,000 Units Intravenous Q T,Th,Sa-HD  . heparin  5,000 Units Subcutaneous Q8H  . hydrALAZINE  25 mg Oral Q8H  . labetalol  200 mg Oral q morning - 10a  . latanoprost  1 drop Both Eyes QHS  . ondansetron (ZOFRAN) IV  4 mg Intravenous Q6H  . pantoprazole  40 mg Oral Daily   acetaminophen, alum & mag hydroxide-simeth, hydrALAZINE, HYDROcodone-acetaminophen, morphine injection  Assessment/ Plan:  65 y.o. African American female with past medical history of chronic kidney disease stage V, anemia chronic kidney disease, hypertension, secondary hyperparathyroidism, history of gastric ulcer, history of pacemaker placement, polycystic kidney disease, history of TIA, vertebral artery aneurysm status post coiling, history of hyperkalemia, and metabolic acidosis   1. End-stage renal disease with metabolic acidosis secondary to  polycystic kidney disease: PermCath placed 06/02/2016. Completed three hemodialysis treatments.   Vein mapping completed. - discontinue furosemide gtt.  - outpatient discharge planning: [facility] [address] Follow with Dr. Cherylann RatelLateef, [facility]  2. Hypertension: blood pressure at goal.  - labetalol, hydralazine, amlodipine.  - Will need to restart an ACE-I/ARB  3. Anemia chronic kidney disease. hemoglobin 8.8.  - Continue Epogen 10,000 units IV with dialysis.  4. Secondary hyperparathyroidism. Calcium and phosphorus at goal. No PTH available.  - No indication to start binders  5.  Pericardial effusion.  Likely secondary to uremia.  This will be treated slowly with dialysis.    LOS: 6 Michelina Mexicano 12/4/201711:02 AM

## 2016-06-06 NOTE — Progress Notes (Signed)
PT Cancellation Note  Patient Details Name: Melissa SchroederCaroline J Bradshaw MRN: [id number] DOB: 11/25/1950   Cancelled Treatment:    Reason Eval/Treat Not Completed: Patient declined, no reason specified. Treatment attempted; pt adamantly refuses PT; "I just don't want to do it!". No further attempt. Re attempt at a later time/date.    Scot DockHeidi E Yianna Tersigni, PTA 06/06/2016, 2:38 PM

## 2016-06-06 NOTE — Care Management Note (Signed)
Case Management Note  Patient Details  Name: Tristan SchroederCaroline J Leflore MRN: [id number] Date of Birth: 11/10/1950  Subjective/Objective:      Per request of Dr Wynelle LinkKolluru, this writer called [facility] on [address] to provide them with Mrs Wyatt PortelaCaroline Sylvestre SS#. [facility]Miles [facility], time not yet specified. Case management will continue to follow.               Action/Plan:   Expected Discharge Date:                  Expected Discharge Plan:     In-House Referral:     Discharge planning Services     Post Acute Care Choice:    Choice offered to:     DME Arranged:    DME Agency:     HH Arranged:    HH Agency:     Status of Service:     If discussed at [employer] Length of Stay Meetings, dates discussed:    Additional Comments:  Antonisha Waskey A, RN 06/06/2016, 2:10 PM

## 2016-06-07 DIAGNOSIS — Z23 Encounter for immunization: Secondary | ICD-10-CM | POA: Diagnosis not present

## 2016-06-07 MED ORDER — LABETALOL HCL 200 MG PO TABS: 200.0000 mg | ORAL_TABLET | Freq: Every morning | ORAL | 0 refills | Status: DC

## 2016-06-07 MED ORDER — HYDRALAZINE HCL 25 MG PO TABS: 25.0000 mg | ORAL_TABLET | Freq: Three times a day (TID) | ORAL | 0 refills | Status: DC

## 2016-06-07 NOTE — Progress Notes (Signed)
HD completed without issue. 1.5L goal met. Patient tolerated well.   

## 2016-06-07 NOTE — Progress Notes (Signed)
Pre dialysis assessment 

## 2016-06-07 NOTE — Discharge Summary (Signed)
[facility] [place] [facility]   PATIENT NAME: Melissa Bradshaw    MR#:  [id number]  DATE OF BIRTH:  02/14/1951  DATE OF ADMISSION:  05/31/2016 ADMITTING PHYSICIAN: Altamese Dilling, MD  DATE OF DISCHARGE: 06/07/16  PRIMARY CARE PHYSICIAN: MASOUD,JAVED, MD    ADMISSION DIAGNOSIS:  End stage renal disease (HCC) [N18.6] Generalized edema [R60.1]  DISCHARGE DIAGNOSIS:  Principal Problem:  ESRD  Started on HD Active Problems:   Protein-calorie malnutrition, severe   SECONDARY DIAGNOSIS:   Past Medical History:  Diagnosis Date  . Anemia   . Aneurysm (HCC)   . Cardiac pacemaker   . CHF (congestive heart failure) (HCC)   . Chronic kidney disease   . Coronary artery disease   . Gastric ulcer   . Hypertension     HOSPITAL COURSE:  Melissa Bradshaw  is a 65 y.o. female admitted 05/31/2016 with chief complaint Leg Swelling . Please see H&P performed by Altamese Dilling, MD for further information. Patient presented with volume overload - tried on lasix drip, failed and ultimately transitioned to conventional HD. After multiple HD sessions - imrpoved  DISCHARGE CONDITIONS:   stable  CONSULTS OBTAINED:  Treatment Team:  Mady Haagensen, MD Renford Dills, MD  DRUG ALLERGIES:   Allergies  Allergen Reactions  . Iodine Other (See Comments)    Unknown reaction  . Ivp Dye [Iodinated Diagnostic Agents] Rash    DISCHARGE MEDICATIONS:   Current Discharge Medication List    START taking these medications   Details  hydrALAZINE (APRESOLINE) 25 MG tablet Take 1 tablet (25 mg total) by mouth every 8 (eight) hours. Qty: 90 tablet, Refills: 0      CONTINUE these medications which have CHANGED   Details  labetalol (NORMODYNE) 200 MG tablet Take 1 tablet (200 mg total) by mouth every morning. Qty: 30 tablet, Refills: 0      CONTINUE these medications which have NOT CHANGED   Details  amLODipine (NORVASC) 10 MG tablet Take 10 mg by  mouth daily.    aspirin 81 MG tablet Take 81 mg by mouth daily.    latanoprost (XALATAN) 0.005 % ophthalmic solution Place 1 drop into both eyes at bedtime.      STOP taking these medications     furosemide (LASIX) 40 MG tablet          DISCHARGE INSTRUCTIONS:    DIET:  Renal diet  DISCHARGE CONDITION:  Stable  ACTIVITY:  Activity as tolerated  OXYGEN:  Home Oxygen: No.   Oxygen Delivery: room air  DISCHARGE LOCATION:  home   If you experience worsening of your admission symptoms, develop shortness of breath, life threatening emergency, suicidal or homicidal thoughts you must seek medical attention immediately by calling 911 or calling your MD immediately  if symptoms less severe.  You Must read complete instructions/literature along with all the possible adverse reactions/side effects for all the Medicines you take and that have been prescribed to you. Take any new Medicines after you have completely understood and accpet all the possible adverse reactions/side effects.   Please note  You were cared for by a hospitalist during your hospital stay. If you have any questions about your discharge medications or the care you received while you were in the hospital after you are discharged, you can call the unit and asked to speak with the hospitalist on call if the hospitalist that took care of you is not available. Once you are discharged, your primary care physician  will handle any further medical issues. Please note that NO REFILLS for any discharge medications will be authorized once you are discharged, as it is imperative that you return to your primary care physician (or establish a relationship with a primary care physician if you do not have one) for your aftercare needs so that they can reassess your need for medications and monitor your lab values.    On the day of Discharge:   VITAL SIGNS:  Blood pressure 120/72, pulse 73, temperature 98.7 F (37.1 C),  temperature source Oral, resp. rate 18, height 5\' 5"  (1.651 m), weight 53.9 kg (118 lb 13.3 oz), SpO2 99 %.  I/O:   Intake/Output Summary (Last 24 hours) at 06/07/16 1120 Last data filed at 06/07/16 1035  Gross per 24 hour  Intake           804.04 ml  Output              100 ml  Net           704.04 ml    PHYSICAL EXAMINATION:  GENERAL:  65 y.o.-year-old patient lying in the bed with no acute distress.  EYES: Pupils equal, round, reactive to light and accommodation. No scleral icterus. Extraocular muscles intact.  HEENT: Head atraumatic, normocephalic. Oropharynx and nasopharynx clear.  NECK:  Supple, no jugular venous distention. No thyroid enlargement, no tenderness.  LUNGS: Normal breath sounds bilaterally, no wheezing, rales,rhonchi or crepitation. No use of accessory muscles of respiration.  CARDIOVASCULAR: S1, S2 normal. No murmurs, rubs, or gallops.  ABDOMEN: Soft, non-tender, distended. Bowel sounds present. No organomegaly or mass.  EXTREMITIES: No pedal edema, cyanosis, or clubbing.  NEUROLOGIC: Cranial nerves II through XII are intact. Muscle strength 5/5 in all extremities. Sensation intact. Gait not checked.  PSYCHIATRIC: The patient is alert and oriented x 3.  SKIN: No obvious rash, lesion, or ulcer.   DATA REVIEW:   CBC  Recent Labs Lab 06/03/16 0507  WBC 4.7  HGB 8.8*  HCT 25.8*  PLT 307    Chemistries   Recent Labs Lab 06/06/16 0934  NA 137  K 3.6  CL 100*  CO2 27  GLUCOSE 155*  BUN 19  CREATININE 2.98*  CALCIUM 8.1*    Cardiac Enzymes No results for input(s): TROPONINI in the last 168 hours.  Microbiology Results  No results found for this or any previous visit.  RADIOLOGY:  Ct Abdomen Pelvis Wo Contrast  Result Date: 06/05/2016 CLINICAL DATA:  Abdominal distention. Known polycystic kidney and liver disease. EXAM: CT ABDOMEN AND PELVIS WITHOUT CONTRAST TECHNIQUE: Multidetector CT imaging of the abdomen and pelvis was performed following  the standard protocol without IV contrast. COMPARISON:  Abdomen pelvis CT dated 06/19/2015 and limited right upper quadrant abdomen ultrasound dated 05/09/2016. FINDINGS: Lower chest: Mild bilateral lower lobe atelectasis. Minimal bilateral pleural effusions. Pericardial effusion measuring 1.9 cm in maximum thickness. Intracardiac pacemaker leads. Hepatobiliary: A large number of liver cysts are again demonstrated. The gallbladder is difficult to distinguish from the liver cysts. Pancreas: Difficult to resolve due to a lack of intraperitoneal and retroperitoneal fat. Grossly normal. Spleen: Normal in size without focal abnormality. Adrenals/Urinary Tract: The kidneys remain diffusely enlarged and almost completely replaced by cysts, including small hemorrhagic cysts. The adrenal glands are not well visualized. Unremarkable urinary bladder. No visible urinary tract calculi or hydronephrosis. There are intrarenal vascular calcifications. Stomach/Bowel: Mild sigmoid colon diverticulosis without evidence of diverticulitis. High density material in a normal appearing appendix. No gastric or  small bowel abnormalities are seen. Vascular/Lymphatic: Extensive arterial calcifications, including the abdominal aorta. No visible enlarged lymph nodes. Reproductive: Uterus and bilateral adnexa are unremarkable. Other: Large amount of ascites with anterior protrusion of the umbilicus. Musculoskeletal: Mild lumbar and lower thoracic spine degenerative changes with more pronounced degenerative changes at the L5-S1 level. IMPRESSION: 1. Large amount of ascites. 2. Previously demonstrated polycystic liver and kidneys. 3. Mild sigmoid diverticulosis. 4. Minimal bilateral pleural effusions. 5. Moderate-sized pericardial effusion. 6. Mild bilateral lower lobe atelectasis. Electronically Signed   By: Beckie SaltsSteven  Reid M.D.   On: 06/05/2016 15:10     Management plans discussed with the patient, family and they are in agreement.  CODE  STATUS:     Code Status Orders        Start     Ordered   05/31/16 2102  Full code  Continuous     05/31/16 2101    Code Status History    Date Active Date Inactive Code Status Order ID Comments User Context   This patient has a current code status but no historical code status.    Advance Directive Documentation   Flowsheet Row Most Recent Value  Type of Advance Directive  Healthcare Power of Attorney  Pre-existing out of facility DNR order (yellow form or pink MOST form)  No data  "MOST" Form in Place?  No data      TOTAL TIME TAKING CARE OF THIS PATIENT: 33 minutes.    Gideon Burstein,  Mardi MainlandDavid K M.D on 06/07/2016 at 11:20 AM  Between 7am to 6pm - Pager - [phone]  After 6pm go to [email or web address] - Social research officer, governmentpassword [email or web address]  [employer] [facility]  Office  [phone]  CC: Primary care physician; Corky DownsMASOUD,JAVED, MD

## 2016-06-07 NOTE — Care Management (Signed)
Results for Melissa Bradshaw, Melissa Bradshaw (MRN [id number]) as of 06/07/2016 10:52  Ref. Range 06/04/2016 10:01  Hep B Core Ab, Tot Latest Ref Range: Negative  Negative

## 2016-06-07 NOTE — Progress Notes (Signed)
Pre dialysis  

## 2016-06-07 NOTE — Care Management (Signed)
Patient to discharge Today.  Orders in for home health services.  Followed up with patient and she is still declining home health service.  MD aware. RNCM signing off. Susann Givensheryl Brawner notified of discharge.

## 2016-06-07 NOTE — Progress Notes (Signed)
PT Cancellation Note  Patient Details Name: Melissa Bradshaw MRN: [id number] DOB: 11/22/1950   Cancelled Treatment:    Reason Eval/Treat Not Completed: Patient declined, no reason specified. Treatment attempted; pt refuses. States she just wants to go home, but despite explanation to work with therapy to demonstrate ability to go home, pt continues to refuse. Pt has refused 3 times consecutively; consider completing current PT orders due to lack of willful participation.    Scot DockHeidi E Barnes, PTA 06/07/2016, 11:00 AM

## 2016-06-07 NOTE — Care Management (Signed)
Per Dr. Wynelle LinkKolluru request I called [facility] on [address] And spoke with Debbie.  Patient has been confirmed for second shift TTS.  She will need to arrive this Thursday at 1030 to complete paper work, bring 2 forms of ID, and her insurance card.

## 2016-06-07 NOTE — Care Management (Signed)
Results for Melissa Bradshaw, Melissa Bradshaw (MRN [id number]) as of 06/07/2016 10:52  Ref. Range 06/02/2016 21:36 06/04/2016 10:01  Hepatitis B Surface Ag Latest Ref Range: Negative  Negative   Hep B S Ab Unknown Non Reactive   Hep B Core Ab, Tot Latest Ref Range: Negative   Negative

## 2016-06-07 NOTE — Progress Notes (Signed)
Post HD assessment  

## 2016-06-07 NOTE — Progress Notes (Signed)
HD initiated via R HD cath without issue. No heparin tx. Pt currently has no complaints. "Hoping to go home today."

## 2016-06-07 NOTE — Progress Notes (Signed)
[facility]  ROUNDING NOTE   Subjective:   Seen and examined on hemodialysis. Tolerating treatment well.    HEMODIALYSIS FLOWSHEET:  Blood Flow Rate (mL/min): 400 mL/min Arterial Pressure (mmHg): -130 mmHg Venous Pressure (mmHg): 130 mmHg Transmembrane Pressure (mmHg): 40 mmHg Ultrafiltration Rate (mL/min): 670 mL/min Dialysate Flow Rate (mL/min): 800 ml/min Conductivity: Machine : 14 Conductivity: Machine : 14 Dialysis Fluid Bolus: Normal Saline Bolus Amount (mL): 250 mL Dialysate Change:  (3k 2.5ca) Intra-Hemodialysis Comments: 1330. Resting    Objective:  Vital signs in last 24 hours:  Temp:  [97.7 F (36.5 C)-99.2 F (37.3 C)] 98.7 F (37.1 C) (12/05 0927) Pulse Rate:  [68-89] 71 (12/05 1130) Resp:  [15-20] 19 (12/05 1130) BP: (100-137)/(55-89) 136/72 (12/05 1130) SpO2:  [96 %-100 %] 98 % (12/05 1130) Weight:  [53.8 kg (118 lb 9.6 oz)-54.6 kg (120 lb 5.9 oz)] 53.9 kg (118 lb 13.3 oz) (12/05 0927)  Weight change: -7.6 kg (-16 lb 12.1 oz) Filed Weights   06/06/16 1500 06/07/16 0500 06/07/16 0927  Weight: 54.6 kg (120 lb 5.9 oz) 53.8 kg (118 lb 9.6 oz) 53.9 kg (118 lb 13.3 oz)    Intake/Output: I/O last 3 completed shifts: In: 941.9 [P.O.:720; I.V.:221.9] Out: 100 [Urine:100]   Intake/Output this shift:  Total I/O In: 240 [P.O.:240] Out: 0   Physical Exam: General: No acute distress  Head: Normocephalic, atraumatic. Moist oral mucosal membranes  Eyes: Anicteric  Neck: Supple, trachea midline  Lungs:  CTAB, normal effort  Heart: S1S2 no rubs  Abdomen:  +organomegally, +distended, nontender  Extremities: No peripheral edema.  Neurologic: Nonfocal, moving all four extremities  Skin: No lesions  Access: R IJ permcath    Basic Metabolic Panel:  Recent Labs Lab 05/31/16 1334 06/01/16 0542 06/03/16 0507 06/04/16 0954 06/05/16 0957 06/06/16 0934  NA 136 138 136  --   --  137  K 4.4 4.5 4.3  --   --  3.6  CL 111 115* 107  --   --   100*  CO2 12* 12* 15*  --   --  27  GLUCOSE 101* 104* 124*  --   --  155*  BUN 48* 50* 47*  --   --  19  CREATININE 5.99* 6.05* 6.36*  --   --  2.98*  CALCIUM 8.6* 8.4* 8.8*  --   --  8.1*  PHOS  --   --  5.8* 4.2 2.4*  --     Liver Function Tests: No results for input(s): AST, ALT, ALKPHOS, BILITOT, PROT, ALBUMIN in the last 168 hours. No results for input(s): LIPASE, AMYLASE in the last 168 hours. No results for input(s): AMMONIA in the last 168 hours.  CBC:  Recent Labs Lab 05/31/16 1334 06/01/16 0542 06/02/16 0529 06/03/16 0507  WBC 5.4 4.1  --  4.7  HGB 7.9* 7.0* 6.5* 8.8*  HCT 23.3* 20.7*  --  25.8*  MCV 80.1 79.3*  --  79.8*  PLT 410 332  --  307    Cardiac Enzymes: No results for input(s): CKTOTAL, CKMB, CKMBINDEX, TROPONINI in the last 168 hours.  BNP: Invalid input(s): POCBNP  CBG: No results for input(s): GLUCAP in the last 168 hours.  Microbiology: No results found for this or any previous visit.  Coagulation Studies: No results for input(s): LABPROT, INR in the last 72 hours.  Urinalysis: No results for input(s): COLORURINE, LABSPEC, PHURINE, GLUCOSEU, HGBUR, BILIRUBINUR, KETONESUR, PROTEINUR, UROBILINOGEN, NITRITE, LEUKOCYTESUR in the last 72 hours.  Invalid  input(s): APPERANCEUR    Imaging: Ct Abdomen Pelvis Wo Contrast  Result Date: 06/05/2016 CLINICAL DATA:  Abdominal distention. Known polycystic kidney and liver disease. EXAM: CT ABDOMEN AND PELVIS WITHOUT CONTRAST TECHNIQUE: Multidetector CT imaging of the abdomen and pelvis was performed following the standard protocol without IV contrast. COMPARISON:  Abdomen pelvis CT dated 06/19/2015 and limited right upper quadrant abdomen ultrasound dated 05/09/2016. FINDINGS: Lower chest: Mild bilateral lower lobe atelectasis. Minimal bilateral pleural effusions. Pericardial effusion measuring 1.9 cm in maximum thickness. Intracardiac pacemaker leads. Hepatobiliary: A large number of liver cysts are again  demonstrated. The gallbladder is difficult to distinguish from the liver cysts. Pancreas: Difficult to resolve due to a lack of intraperitoneal and retroperitoneal fat. Grossly normal. Spleen: Normal in size without focal abnormality. Adrenals/Urinary Tract: The kidneys remain diffusely enlarged and almost completely replaced by cysts, including small hemorrhagic cysts. The adrenal glands are not well visualized. Unremarkable urinary bladder. No visible urinary tract calculi or hydronephrosis. There are intrarenal vascular calcifications. Stomach/Bowel: Mild sigmoid colon diverticulosis without evidence of diverticulitis. High density material in a normal appearing appendix. No gastric or small bowel abnormalities are seen. Vascular/Lymphatic: Extensive arterial calcifications, including the abdominal aorta. No visible enlarged lymph nodes. Reproductive: Uterus and bilateral adnexa are unremarkable. Other: Large amount of ascites with anterior protrusion of the umbilicus. Musculoskeletal: Mild lumbar and lower thoracic spine degenerative changes with more pronounced degenerative changes at the L5-S1 level. IMPRESSION: 1. Large amount of ascites. 2. Previously demonstrated polycystic liver and kidneys. 3. Mild sigmoid diverticulosis. 4. Minimal bilateral pleural effusions. 5. Moderate-sized pericardial effusion. 6. Mild bilateral lower lobe atelectasis. Electronically Signed   By: Beckie SaltsSteven  Reid M.D.   On: 06/05/2016 15:10     Medications:    . amLODipine  10 mg Oral Daily  . aspirin EC  81 mg Oral Daily  . epoetin (EPOGEN/PROCRIT) injection  10,000 Units Intravenous Q T,Th,Sa-HD  . heparin  5,000 Units Subcutaneous Q8H  . hydrALAZINE  25 mg Oral Q8H  . labetalol  200 mg Oral q morning - 10a  . latanoprost  1 drop Both Eyes QHS  . ondansetron (ZOFRAN) IV  4 mg Intravenous Q6H  . pantoprazole  40 mg Oral Daily   acetaminophen, alum & mag hydroxide-simeth, hydrALAZINE, HYDROcodone-acetaminophen, morphine  injection  Assessment/ Plan:  65 y.o. African American female with past medical history of chronic kidney disease stage V, anemia chronic kidney disease, hypertension, secondary hyperparathyroidism, history of gastric ulcer, history of pacemaker placement, polycystic kidney disease, history of TIA, vertebral artery aneurysm status post coiling, history of hyperkalemia, and metabolic acidosis   1. End-stage renal disease with metabolic acidosis secondary to polycystic kidney disease: PermCath placed 06/02/2016. Completed three hemodialysis treatments.   Vein mapping completed. - discontinued furosemide gtt.  - outpatient discharge planning: [facility] [address] Follow with Dr. Cherylann RatelLateef, [facility] TTS second shift.   2. Hypertension: blood pressure at goal.  - labetalol, hydralazine, amlodipine.  - Will need to restart an ACE-I/ARB  3. Anemia chronic kidney disease.   - Continue Epogen 10,000 units IV with dialysis.  4. Secondary hyperparathyroidism. Calcium and phosphorus at goal. No PTH available.  - No indication to start binders  5.  Pericardial effusion.  Likely secondary to uremia.  This will be treated slowly with dialysis.    LOS: 7 Yates Weisgerber 12/5/201711:43 AM

## 2016-06-07 NOTE — Progress Notes (Signed)
Pt A and O x 4. VSS. Pt tolerating diet well. No complaints of pain or nausea. IV removed intact, prescriptions given. Pt voiced understanding of discharge instructions with no further questions. Pt received dialysis treatment prior to discharge. Pt set-up with outpatient dialysis clinic. Pt discharged via wheelchair with nurse.

## 2016-06-09 DIAGNOSIS — E119 Type 2 diabetes mellitus without complications: Secondary | ICD-10-CM | POA: Diagnosis not present

## 2016-06-09 DIAGNOSIS — E8770 Fluid overload, unspecified: Secondary | ICD-10-CM | POA: Diagnosis not present

## 2016-06-09 DIAGNOSIS — N186 End stage renal disease: Secondary | ICD-10-CM | POA: Diagnosis not present

## 2016-06-09 DIAGNOSIS — Z79899 Other long term (current) drug therapy: Secondary | ICD-10-CM | POA: Diagnosis not present

## 2016-06-09 DIAGNOSIS — Z992 Dependence on renal dialysis: Secondary | ICD-10-CM | POA: Diagnosis not present

## 2016-06-09 DIAGNOSIS — R17 Unspecified jaundice: Secondary | ICD-10-CM | POA: Diagnosis not present

## 2016-06-11 DIAGNOSIS — N186 End stage renal disease: Secondary | ICD-10-CM | POA: Diagnosis not present

## 2016-06-11 DIAGNOSIS — Z992 Dependence on renal dialysis: Secondary | ICD-10-CM | POA: Diagnosis not present

## 2016-06-11 DIAGNOSIS — E8770 Fluid overload, unspecified: Secondary | ICD-10-CM | POA: Diagnosis not present

## 2016-06-14 DIAGNOSIS — E8770 Fluid overload, unspecified: Secondary | ICD-10-CM | POA: Diagnosis not present

## 2016-06-14 DIAGNOSIS — N186 End stage renal disease: Secondary | ICD-10-CM | POA: Diagnosis not present

## 2016-06-14 DIAGNOSIS — Z992 Dependence on renal dialysis: Secondary | ICD-10-CM | POA: Diagnosis not present

## 2016-06-15 DIAGNOSIS — Z992 Dependence on renal dialysis: Secondary | ICD-10-CM | POA: Diagnosis not present

## 2016-06-15 DIAGNOSIS — N186 End stage renal disease: Secondary | ICD-10-CM | POA: Diagnosis not present

## 2016-06-15 DIAGNOSIS — E8770 Fluid overload, unspecified: Secondary | ICD-10-CM | POA: Diagnosis not present

## 2016-06-16 DIAGNOSIS — E8770 Fluid overload, unspecified: Secondary | ICD-10-CM | POA: Diagnosis not present

## 2016-06-16 DIAGNOSIS — Z992 Dependence on renal dialysis: Secondary | ICD-10-CM | POA: Diagnosis not present

## 2016-06-16 DIAGNOSIS — N186 End stage renal disease: Secondary | ICD-10-CM | POA: Diagnosis not present

## 2016-06-18 DIAGNOSIS — Z992 Dependence on renal dialysis: Secondary | ICD-10-CM | POA: Diagnosis not present

## 2016-06-18 DIAGNOSIS — E8770 Fluid overload, unspecified: Secondary | ICD-10-CM | POA: Diagnosis not present

## 2016-06-18 DIAGNOSIS — N186 End stage renal disease: Secondary | ICD-10-CM | POA: Diagnosis not present

## 2016-06-20 ENCOUNTER — Encounter (INDEPENDENT_AMBULATORY_CARE_PROVIDER_SITE_OTHER): Payer: Medicare Other | Admitting: Vascular Surgery

## 2016-06-21 DIAGNOSIS — N186 End stage renal disease: Secondary | ICD-10-CM | POA: Diagnosis not present

## 2016-06-21 DIAGNOSIS — E8770 Fluid overload, unspecified: Secondary | ICD-10-CM | POA: Diagnosis not present

## 2016-06-21 DIAGNOSIS — Z992 Dependence on renal dialysis: Secondary | ICD-10-CM | POA: Diagnosis not present

## 2016-06-23 ENCOUNTER — Telehealth: Payer: Self-pay | Admitting: Gastroenterology

## 2016-06-23 DIAGNOSIS — E8770 Fluid overload, unspecified: Secondary | ICD-10-CM | POA: Diagnosis not present

## 2016-06-23 DIAGNOSIS — N186 End stage renal disease: Secondary | ICD-10-CM | POA: Diagnosis not present

## 2016-06-23 DIAGNOSIS — Z992 Dependence on renal dialysis: Secondary | ICD-10-CM | POA: Diagnosis not present

## 2016-06-23 NOTE — Telephone Encounter (Signed)
Left voice message for patient to call and schedule appointment with GI for chronic ascites. [facility]

## 2016-06-24 ENCOUNTER — Telehealth: Payer: Self-pay | Admitting: Gastroenterology

## 2016-06-24 ENCOUNTER — Encounter: Payer: Self-pay | Admitting: Gastroenterology

## 2016-06-24 DIAGNOSIS — I10 Essential (primary) hypertension: Secondary | ICD-10-CM | POA: Diagnosis not present

## 2016-06-24 DIAGNOSIS — N183 Chronic kidney disease, stage 3 (moderate): Secondary | ICD-10-CM | POA: Diagnosis not present

## 2016-06-24 DIAGNOSIS — Z992 Dependence on renal dialysis: Secondary | ICD-10-CM | POA: Diagnosis not present

## 2016-06-24 DIAGNOSIS — N186 End stage renal disease: Secondary | ICD-10-CM | POA: Diagnosis not present

## 2016-06-24 NOTE — Telephone Encounter (Signed)
Left voice message for patient to call and schedule appointment with GI for chronic ascites. [facility]

## 2016-06-25 DIAGNOSIS — N186 End stage renal disease: Secondary | ICD-10-CM | POA: Diagnosis not present

## 2016-06-25 DIAGNOSIS — E8770 Fluid overload, unspecified: Secondary | ICD-10-CM | POA: Diagnosis not present

## 2016-06-25 DIAGNOSIS — Z992 Dependence on renal dialysis: Secondary | ICD-10-CM | POA: Diagnosis not present

## 2016-06-30 DIAGNOSIS — N186 End stage renal disease: Secondary | ICD-10-CM | POA: Diagnosis not present

## 2016-06-30 DIAGNOSIS — E8770 Fluid overload, unspecified: Secondary | ICD-10-CM | POA: Diagnosis not present

## 2016-06-30 DIAGNOSIS — Z992 Dependence on renal dialysis: Secondary | ICD-10-CM | POA: Diagnosis not present

## 2016-07-02 DIAGNOSIS — N186 End stage renal disease: Secondary | ICD-10-CM | POA: Diagnosis not present

## 2016-07-02 DIAGNOSIS — E8770 Fluid overload, unspecified: Secondary | ICD-10-CM | POA: Diagnosis not present

## 2016-07-02 DIAGNOSIS — Z992 Dependence on renal dialysis: Secondary | ICD-10-CM | POA: Diagnosis not present

## 2016-07-03 DIAGNOSIS — Z992 Dependence on renal dialysis: Secondary | ICD-10-CM | POA: Diagnosis not present

## 2016-07-03 DIAGNOSIS — N186 End stage renal disease: Secondary | ICD-10-CM | POA: Diagnosis not present

## 2016-07-05 DIAGNOSIS — E8779 Other fluid overload: Secondary | ICD-10-CM | POA: Diagnosis not present

## 2016-07-05 DIAGNOSIS — N186 End stage renal disease: Secondary | ICD-10-CM | POA: Diagnosis not present

## 2016-07-05 DIAGNOSIS — Z992 Dependence on renal dialysis: Secondary | ICD-10-CM | POA: Diagnosis not present

## 2016-07-06 DIAGNOSIS — Z992 Dependence on renal dialysis: Secondary | ICD-10-CM | POA: Diagnosis not present

## 2016-07-06 DIAGNOSIS — N186 End stage renal disease: Secondary | ICD-10-CM | POA: Diagnosis not present

## 2016-07-06 DIAGNOSIS — E8779 Other fluid overload: Secondary | ICD-10-CM | POA: Diagnosis not present

## 2016-07-09 DIAGNOSIS — Z992 Dependence on renal dialysis: Secondary | ICD-10-CM | POA: Diagnosis not present

## 2016-07-09 DIAGNOSIS — E8779 Other fluid overload: Secondary | ICD-10-CM | POA: Diagnosis not present

## 2016-07-09 DIAGNOSIS — N186 End stage renal disease: Secondary | ICD-10-CM | POA: Diagnosis not present

## 2016-07-11 ENCOUNTER — Ambulatory Visit (INDEPENDENT_AMBULATORY_CARE_PROVIDER_SITE_OTHER): Payer: Medicare Other | Admitting: Vascular Surgery

## 2016-07-11 ENCOUNTER — Encounter (INDEPENDENT_AMBULATORY_CARE_PROVIDER_SITE_OTHER): Payer: Self-pay

## 2016-07-11 ENCOUNTER — Encounter (INDEPENDENT_AMBULATORY_CARE_PROVIDER_SITE_OTHER): Payer: Self-pay | Admitting: Vascular Surgery

## 2016-07-11 VITALS — BP 148/78 | HR 83 | Resp 16 | Ht 65.5 in | Wt 134.0 lb

## 2016-07-11 DIAGNOSIS — N185 Chronic kidney disease, stage 5: Secondary | ICD-10-CM | POA: Diagnosis not present

## 2016-07-11 DIAGNOSIS — N186 End stage renal disease: Secondary | ICD-10-CM

## 2016-07-11 DIAGNOSIS — Q612 Polycystic kidney, adult type: Secondary | ICD-10-CM | POA: Diagnosis not present

## 2016-07-11 DIAGNOSIS — I10 Essential (primary) hypertension: Secondary | ICD-10-CM | POA: Diagnosis not present

## 2016-07-11 DIAGNOSIS — D631 Anemia in chronic kidney disease: Secondary | ICD-10-CM | POA: Diagnosis not present

## 2016-07-11 NOTE — Progress Notes (Signed)
MRN : [id number]  Melissa Bradshaw is a 66 y.o. ([birth year]) female who presents with chief complaint of  Chief Complaint  Patient presents with  . New Evaluation    Port placement  .  History of Present Illness:  The patient is seen for evaluation of dialysis access.  The patient has a history of multiple failed accesses.  There have been accesses in both arms and in the thighs.    Current access is via a catheter which is functioning Adequately.  There have not been any episodes of catheter infection.  The patient denies amaurosis fugax or recent TIA symptoms. There are no recent neurological changes noted. The patient denies claudication symptoms or rest pain symptoms. The patient denies history of DVT, PE or superficial thrombophlebitis. The patient denies recent episodes of angina or shortness of breath.   Vein mapping performed at the hospital shows small but patent cephalic and basilic veins of the left arm   Current Meds  Medication Sig  . amLODipine (NORVASC) 10 MG tablet Take 10 mg by mouth daily.  Marland Kitchen aspirin 81 MG tablet Take 81 mg by mouth daily.  . furosemide (LASIX) 20 MG tablet   . labetalol (NORMODYNE) 200 MG tablet Take 1 tablet (200 mg total) by mouth every morning.  . latanoprost (XALATAN) 0.005 % ophthalmic solution Place 1 drop into both eyes at bedtime.    Past Medical History:  Diagnosis Date  . Anemia   . Aneurysm (HCC)   . Cardiac pacemaker   . CHF (congestive heart failure) (HCC)   . Chronic kidney disease   . Coronary artery disease   . Gastric ulcer   . Hypertension     Past Surgical History:  Procedure Laterality Date  . ANEURYSM COILING    . COLONOSCOPY     2013  . PACEMAKER PLACEMENT    . PERIPHERAL VASCULAR CATHETERIZATION N/A 06/02/2016   Procedure: Dialysis/Perma Catheter Insertion;  Surgeon: Annice Needy, MD;  Location: [facility] INVASIVE CV LAB;  Service: Cardiovascular;  Laterality: N/A;    Social History Social History  Substance  Use Topics  . Smoking status: Former Smoker    Packs/day: 0.25    Years: 41.00    Types: Cigarettes  . Smokeless tobacco: Never Used  . Alcohol use No    Family History Family History  Problem Relation Age of Onset  . Hypertension Mother   . Diabetes Father   . Cancer Sister   . Stroke Sister   . Hypertension Sister   No family history of bleeding/clotting disorders, porphyria or autoimmune disease   Allergies  Allergen Reactions  . Iodine Other (See Comments)    Unknown reaction  . Ivp Dye [Iodinated Diagnostic Agents] Rash     REVIEW OF SYSTEMS (Negative unless checked)  Constitutional: [] Weight loss  [] Fever  [] Chills Cardiac: [] Chest pain   [] Chest pressure   [] Palpitations   [] Shortness of breath when laying flat   [] Shortness of breath with exertion. Vascular:  [] Pain in legs with walking   [] Pain in legs at rest  [] History of DVT   [] Phlebitis   [] Swelling in legs   [] Varicose veins   [] Non-healing ulcers Pulmonary:   [] Uses home oxygen   [] Productive cough   [] Hemoptysis   [] Wheeze  [] COPD   [] Asthma Neurologic:  [] Dizziness   [] Seizures   [] History of stroke   [] History of TIA  [] Aphasia   [] Vissual changes   [] Weakness or numbness in arm   [] Weakness  or numbness in leg Musculoskeletal:   [] Joint swelling   [] Joint pain   [] Low back pain Hematologic:  [] Easy bruising  [] Easy bleeding   [] Hypercoagulable state   [] Anemic Gastrointestinal:  [] Diarrhea   [] Vomiting  [] Gastroesophageal reflux/heartburn   [] Difficulty swallowing. Genitourinary:  [x] Chronic kidney disease   [] Difficult urination  [] Frequent urination   [] Blood in urine Skin:  [] Rashes   [] Ulcers  Psychological:  [] History of anxiety   []  History of major depression.  Physical Examination  Vitals:   07/11/16 0959  BP: (!) 148/78  Pulse: 83  Resp: 16  Weight: 134 lb (60.8 kg)  Height: 5' 5.5" (1.664 m)   Body mass index is 21.96 kg/m. Gen: WD/WN, NAD Head: [place]/AT, No temporalis wasting.    Ear/Nose/Throat: Hearing grossly intact, nares w/o erythema or drainage, poor dentition Eyes: PER, EOMI, sclera nonicteric.  Neck: Supple, no masses.  No bruit or JVD.  Pulmonary:  Good air movement, clear to auscultation bilaterally, no use of accessory muscles.  Cardiac: RRR, normal S1, S2, no Murmurs. Vascular: Both upper extremities are clean skin is intact permacath is in good repair no evidence of erythema or drainage at the exit site. Catheter is nontender to palpation. The cephalic vein is readily visible in the upper arm. It does appear to be on the smaller side but I believe that a fistula can be created. Vessel Right Left  Radial Palpable Palpable  Ulnar Palpable Palpable  Brachial Palpable Palpable  Carotid Palpable Palpable  Femoral Palpable Palpable  Popliteal Palpable Palpable  PT Palpable Palpable  DP Palpable Palpable   Gastrointestinal: soft, non-distended. No guarding/no peritoneal signs.  Musculoskeletal: M/S 5/5 throughout.  No deformity or atrophy.  Neurologic: CN 2-12 intact. Pain and light touch intact in extremities.  Symmetrical.  Speech is fluent. Motor exam as listed above. Psychiatric: Judgment intact, Mood & affect appropriate for pt's clinical situation. Dermatologic: No rashes or ulcers noted.  No changes consistent with cellulitis. Lymph : No Cervical lymphadenopathy, no lichenification or skin changes of chronic lymphedema.  CBC Lab Results  Component Value Date   WBC 4.7 06/03/2016   HGB 8.8 (L) 06/03/2016   HCT 25.8 (L) 06/03/2016   MCV 79.8 (L) 06/03/2016   PLT 307 06/03/2016    BMET    Component Value Date/Time   NA 137 06/06/2016 0934   NA 141 01/30/2014 1505   K 3.6 06/06/2016 0934   K 5.0 01/30/2014 1505   CL 100 (L) 06/06/2016 0934   CL 116 (H) 01/30/2014 1505   CO2 27 06/06/2016 0934   CO2 17 (L) 01/30/2014 1505   GLUCOSE 155 (H) 06/06/2016 0934   GLUCOSE 93 01/30/2014 1505   BUN 19 06/06/2016 0934   BUN 50 (H) 06/19/2014  1413   CREATININE 2.98 (H) 06/06/2016 0934   CREATININE 3.33 (H) 06/19/2014 1413   CALCIUM 8.1 (L) 06/06/2016 0934   CALCIUM 8.8 01/30/2014 1505   GFRNONAA 15 (L) 06/06/2016 0934   GFRNONAA 15 (L) 06/19/2014 1413   GFRNONAA 14 (L) 01/30/2014 1505   GFRAA 18 (L) 06/06/2016 0934   GFRAA 18 (L) 06/19/2014 1413   GFRAA 16 (L) 01/30/2014 1505   CrCl cannot be calculated (Patient's most recent lab result is older than the maximum 21 days allowed.).  COAG No results found for: INR, PROTIME  Radiology No results found.   Assessment/Plan 1. End stage renal disease (HCC) Recommend:  At this time the patient does not have appropriate extremity access for dialysis.  The cephalic vein is readily visible in the upper arm. It does appear to be on the smaller side but I believe that a fistula can be created. If at the time surgery this cephalic vein will not work then I will proceed with a brachial axillary graft in the left arm. Patient is aware of this this was discussed with her in detail and she voices understanding of this plan. She voices understanding of preferring a fistula over a graft  Patient should have a left brachiocephalic fistula created.  The risks, benefits and alternative therapies were reviewed in detail with the patient.  All questions were answered.  The patient agrees to proceed with surgery.    A total of 35 minutes was spent with this patient and greater than 50% was spent in counseling and coordination of care with the patient.  Discussion included the treatment options for vascular disease including indications for surgery and intervention.  Also discussed is the appropriate timing of treatment.  In addition medical therapy was discussed.   2. Anemia of chronic renal failure, stage 5 (HCC) Continue Procrit  3. ADPKD (autosomal dominant polycystic kidney disease) Continue dialysis without interruption, further plans per nephrology  4. Essential (primary)  hypertension Continue antihypertensive medications as already ordered, these medications have been reviewed and there are no changes at this time.    Levora Dredge, MD  07/11/2016 10:42 AM

## 2016-07-12 ENCOUNTER — Other Ambulatory Visit (INDEPENDENT_AMBULATORY_CARE_PROVIDER_SITE_OTHER): Payer: Self-pay | Admitting: Vascular Surgery

## 2016-07-12 DIAGNOSIS — E8779 Other fluid overload: Secondary | ICD-10-CM | POA: Diagnosis not present

## 2016-07-12 DIAGNOSIS — Z992 Dependence on renal dialysis: Secondary | ICD-10-CM | POA: Diagnosis not present

## 2016-07-12 DIAGNOSIS — N186 End stage renal disease: Secondary | ICD-10-CM | POA: Diagnosis not present

## 2016-07-13 ENCOUNTER — Encounter: Payer: Self-pay | Admitting: *Deleted

## 2016-07-13 ENCOUNTER — Encounter
Admission: RE | Admit: 2016-07-13 | Discharge: 2016-07-13 | Disposition: A | Discharge status: Home / Self Care | Payer: Medicare Other | Source: Ambulatory Visit | Attending: Vascular Surgery | Admitting: Vascular Surgery

## 2016-07-13 DIAGNOSIS — Z01818 Encounter for other preprocedural examination: Secondary | ICD-10-CM | POA: Insufficient documentation

## 2016-07-13 DIAGNOSIS — N186 End stage renal disease: Secondary | ICD-10-CM | POA: Diagnosis not present

## 2016-07-13 HISTORY — DX: Unspecified osteoarthritis, unspecified site: M19.90

## 2016-07-13 HISTORY — DX: Dependence on renal dialysis: Z99.2

## 2016-07-13 HISTORY — DX: Headache, unspecified: R51.9

## 2016-07-13 HISTORY — DX: Constipation, unspecified: K59.00

## 2016-07-13 HISTORY — DX: Headache: R51

## 2016-07-13 HISTORY — DX: Unspecified glaucoma: H40.9

## 2016-07-13 HISTORY — DX: Presence of cardiac pacemaker: Z95.0

## 2016-07-13 LAB — BASIC METABOLIC PANEL
ANION GAP: 16 — AB (ref 5–15)
BUN: 46 mg/dL — ABNORMAL HIGH (ref 6–20)
CALCIUM: 8.4 mg/dL — AB (ref 8.9–10.3)
CO2: 25 mmol/L (ref 22–32)
CREATININE: 4 mg/dL — AB (ref 0.44–1.00)
Chloride: 102 mmol/L (ref 101–111)
GFR calc Af Amer: 12 mL/min — ABNORMAL LOW (ref >60)
GFR calc non Af Amer: 11 mL/min — ABNORMAL LOW (ref >60)
GLUCOSE: 88 mg/dL (ref 65–99)
Potassium: 3.9 mmol/L (ref 3.5–5.1)
Sodium: 143 mmol/L (ref 135–145)

## 2016-07-13 LAB — TYPE AND SCREEN
ABO/RH(D): B POS
Antibody Screen: NEGATIVE
EXTEND SAMPLE REASON: TRANSFUSED

## 2016-07-13 LAB — CBC WITH DIFFERENTIAL/PLATELET
BASOS PCT: 1 %
Basophils Absolute: 0 10*3/uL (ref 0–0.1)
Eosinophils Absolute: 0.1 10*3/uL (ref 0–0.7)
Eosinophils Relative: 2 %
HEMATOCRIT: 26.6 % — AB (ref 35.0–47.0)
Hemoglobin: 8.7 g/dL — ABNORMAL LOW (ref 12.0–16.0)
LYMPHS ABS: 1.1 10*3/uL (ref 1.0–3.6)
Lymphocytes Relative: 22 %
MCH: 27.1 pg (ref 26.0–34.0)
MCHC: 32.8 g/dL (ref 32.0–36.0)
MCV: 82.6 fL (ref 80.0–100.0)
Monocytes Absolute: 0.3 10*3/uL (ref 0.2–0.9)
Monocytes Relative: 7 %
NEUTROS ABS: 3.4 10*3/uL (ref 1.4–6.5)
Neutrophils Relative %: 68 %
Platelets: 349 10*3/uL (ref 150–440)
RBC: 3.22 MIL/uL — ABNORMAL LOW (ref 3.80–5.20)
RDW: 23.8 % — AB (ref 11.5–14.5)
WBC: 5 10*3/uL (ref 3.6–11.0)

## 2016-07-13 LAB — SURGICAL PCR SCREEN
MRSA, PCR: NEGATIVE
STAPHYLOCOCCUS AUREUS: NEGATIVE

## 2016-07-13 LAB — PROTIME-INR
INR: 1.11
Prothrombin Time: 14.3 seconds (ref 11.4–15.2)

## 2016-07-13 LAB — APTT: aPTT: 31 seconds (ref 24–36)

## 2016-07-13 NOTE — Pre-Procedure Instructions (Signed)
EKG 05/31/16. SEE ED MD NOTE. EKG10/08 WITH NOTED SEPTAL INFARCT

## 2016-07-13 NOTE — Patient Instructions (Signed)
  Your procedure is scheduled on: July 20, 2016 (Wednesday) Report to Same Day Surgery 2nd floor medical mall [facility](Medical Mall Entrance-take elevator on left to 2nd floor.  Check in with surgery information desk.) To find out your arrival time please call [phone] between 1PM - 3PM on  July 19, 2016 (Tuesday)  Remember: Instructions that are not followed completely may result in serious medical risk, up to and including death, or upon the discretion of your surgeon and anesthesiologist your surgery may need to be rescheduled.    _x___ 1. Do not eat food or drink liquids after midnight. No gum chewing or hard candies.     __x__ 2. No Alcohol for 24 hours before or after surgery.   __x__3. No Smoking for 24 prior to surgery.   ____  4. Bring all medications with you on the day of surgery if instructed.    __x__ 5. Notify your doctor if there is any change in your medical condition     (cold, fever, infections).     Do not wear jewelry, make-up, hairpins, clips or nail polish.  Do not wear lotions, powders, or perfumes. You may wear deodorant.  Do not shave 48 hours prior to surgery. Men may shave face and neck.  Do not bring valuables to the hospital.    [facility] is not responsible for any belongings or valuables.               Contacts, dentures or bridgework may not be worn into surgery.  Leave your suitcase in the car. After surgery it may be brought to your room.  For patients admitted to the hospital, discharge time is determined by your treatment team.   Patients discharged the day of surgery will not be allowed to drive home.  You will need someone to drive you home and stay with you the night of your procedure.    Please read over the following fact sheets that you were given:   [facility] Preparing for Surgery and or MRSA Information   _x___ Take these medicines the morning of surgery with A SIP OF WATER:    1. AMLODIPINE   2. LABETALOL   ____Fleets enema or  Magnesium Citrate as directed.   _x___ Use CHG Soap or sage wipes as directed on instruction sheet   ____ Use inhalers on the day of surgery and bring to hospital day of surgery  ____ Stop metformin 2 days prior to surgery    ____ Take 1/2 of usual insulin dose the night before surgery and none on the morning of           surgery.   ____ Stop Aspirin, Coumadin, Pllavix ,Eliquis, Effient, or Pradaxa  x__ Stop Anti-inflammatories such as Advil, Aleve, Ibuprofen, Motrin, Naproxen,          Naprosyn, Goodies powders or aspirin products. Ok to take Tylenol.   ____ Stop supplements until after surgery.    ____ Bring C-Pap to the hospital.

## 2016-07-14 DIAGNOSIS — E119 Type 2 diabetes mellitus without complications: Secondary | ICD-10-CM | POA: Diagnosis not present

## 2016-07-14 DIAGNOSIS — E8779 Other fluid overload: Secondary | ICD-10-CM | POA: Diagnosis not present

## 2016-07-14 DIAGNOSIS — N186 End stage renal disease: Secondary | ICD-10-CM | POA: Diagnosis not present

## 2016-07-14 DIAGNOSIS — Z992 Dependence on renal dialysis: Secondary | ICD-10-CM | POA: Diagnosis not present

## 2016-07-16 DIAGNOSIS — Z992 Dependence on renal dialysis: Secondary | ICD-10-CM | POA: Diagnosis not present

## 2016-07-16 DIAGNOSIS — N186 End stage renal disease: Secondary | ICD-10-CM | POA: Diagnosis not present

## 2016-07-16 DIAGNOSIS — E8779 Other fluid overload: Secondary | ICD-10-CM | POA: Diagnosis not present

## 2016-07-19 ENCOUNTER — Ambulatory Visit: Payer: Self-pay | Admitting: Gastroenterology

## 2016-07-19 DIAGNOSIS — N186 End stage renal disease: Secondary | ICD-10-CM | POA: Diagnosis not present

## 2016-07-19 DIAGNOSIS — E8779 Other fluid overload: Secondary | ICD-10-CM | POA: Diagnosis not present

## 2016-07-19 DIAGNOSIS — Z992 Dependence on renal dialysis: Secondary | ICD-10-CM | POA: Diagnosis not present

## 2016-07-20 ENCOUNTER — Encounter: Payer: Self-pay | Admitting: Anesthesiology

## 2016-07-20 ENCOUNTER — Ambulatory Visit
Admission: RE | Admit: 2016-07-20 | Discharge: 2016-07-20 | Disposition: A | Discharge status: Home / Self Care | Payer: Medicare Other | Source: Ambulatory Visit | Attending: Vascular Surgery | Admitting: Vascular Surgery

## 2016-07-20 ENCOUNTER — Ambulatory Visit: Payer: Medicare Other | Admitting: Anesthesiology

## 2016-07-20 ENCOUNTER — Encounter
Admission: RE | Disposition: A | Discharge status: Home / Self Care | Payer: Self-pay | Source: Ambulatory Visit | Attending: Vascular Surgery

## 2016-07-20 DIAGNOSIS — Z87891 Personal history of nicotine dependence: Secondary | ICD-10-CM | POA: Diagnosis not present

## 2016-07-20 DIAGNOSIS — Z45010 Encounter for checking and testing of cardiac pacemaker pulse generator [battery]: Secondary | ICD-10-CM

## 2016-07-20 DIAGNOSIS — Z950 Presence of cardiac pacemaker: Secondary | ICD-10-CM | POA: Diagnosis not present

## 2016-07-20 DIAGNOSIS — I2510 Atherosclerotic heart disease of native coronary artery without angina pectoris: Secondary | ICD-10-CM | POA: Diagnosis not present

## 2016-07-20 DIAGNOSIS — I509 Heart failure, unspecified: Secondary | ICD-10-CM | POA: Diagnosis not present

## 2016-07-20 DIAGNOSIS — M1990 Unspecified osteoarthritis, unspecified site: Secondary | ICD-10-CM | POA: Insufficient documentation

## 2016-07-20 DIAGNOSIS — I739 Peripheral vascular disease, unspecified: Secondary | ICD-10-CM | POA: Diagnosis not present

## 2016-07-20 DIAGNOSIS — I120 Hypertensive chronic kidney disease with stage 5 chronic kidney disease or end stage renal disease: Secondary | ICD-10-CM | POA: Diagnosis not present

## 2016-07-20 DIAGNOSIS — I132 Hypertensive heart and chronic kidney disease with heart failure and with stage 5 chronic kidney disease, or end stage renal disease: Secondary | ICD-10-CM | POA: Insufficient documentation

## 2016-07-20 DIAGNOSIS — N186 End stage renal disease: Secondary | ICD-10-CM | POA: Diagnosis not present

## 2016-07-20 HISTORY — PX: AV FISTULA PLACEMENT: SHX1204

## 2016-07-20 LAB — POCT I-STAT 4, (NA,K, GLUC, HGB,HCT)
GLUCOSE: 96 mg/dL (ref 65–99)
HCT: 25 % — ABNORMAL LOW (ref 36.0–46.0)
HEMOGLOBIN: 8.5 g/dL — AB (ref 12.0–15.0)
POTASSIUM: 4.1 mmol/L (ref 3.5–5.1)
Sodium: 143 mmol/L (ref 135–145)

## 2016-07-20 LAB — TYPE AND SCREEN
ABO/RH(D): B POS
Antibody Screen: NEGATIVE

## 2016-07-20 SURGERY — ARTERIOVENOUS (AV) FISTULA CREATION
Anesthesia: General | Site: Arm Upper | Laterality: Left | Wound class: Clean

## 2016-07-20 MED ORDER — FAMOTIDINE 20 MG PO TABS
20.0000 mg | ORAL_TABLET | Freq: Once | ORAL | Status: AC
  Administered 2016-07-20: 20 mg via ORAL

## 2016-07-20 MED ORDER — CHLORHEXIDINE GLUCONATE CLOTH 2 % EX PADS: 6.0000 | MEDICATED_PAD | Freq: Once | CUTANEOUS | Status: DC

## 2016-07-20 MED ORDER — BUPIVACAINE HCL (PF) 0.5 % IJ SOLN
INTRAMUSCULAR | Status: AC
Start: 2016-07-20 — End: 2016-07-20
  Filled 2016-07-20: qty 30

## 2016-07-20 MED ORDER — BUPIVACAINE HCL (PF) 0.5 % IJ SOLN
INTRAMUSCULAR | Status: DC | PRN
  Administered 2016-07-20: 15 mL

## 2016-07-20 MED ORDER — OXYCODONE HCL 5 MG/5ML PO SOLN: 5.0000 mg | Freq: Once | ORAL | Status: DC | PRN

## 2016-07-20 MED ORDER — CEFAZOLIN SODIUM-DEXTROSE 2-4 GM/100ML-% IV SOLN
2.0000 g | INTRAVENOUS | Status: AC
  Administered 2016-07-20: 2 g via INTRAVENOUS

## 2016-07-20 MED ORDER — PROPOFOL 10 MG/ML IV BOLUS
INTRAVENOUS | Status: DC | PRN
  Administered 2016-07-20: 100 mg via INTRAVENOUS
  Administered 2016-07-20: 50 mg via INTRAVENOUS

## 2016-07-20 MED ORDER — PHENYLEPHRINE HCL 10 MG/ML IJ SOLN
INTRAMUSCULAR | Status: DC | PRN
  Administered 2016-07-20 (×2): 100 ug via INTRAVENOUS

## 2016-07-20 MED ORDER — PROPOFOL 10 MG/ML IV BOLUS
INTRAVENOUS | Status: AC
  Filled 2016-07-20: qty 20

## 2016-07-20 MED ORDER — FENTANYL CITRATE (PF) 100 MCG/2ML IJ SOLN
INTRAMUSCULAR | Status: AC
  Filled 2016-07-20: qty 2

## 2016-07-20 MED ORDER — PHENYLEPHRINE HCL 10 MG/ML IJ SOLN
INTRAMUSCULAR | Status: AC
  Filled 2016-07-20: qty 1

## 2016-07-20 MED ORDER — HEPARIN SODIUM (PORCINE) 1000 UNIT/ML IJ SOLN
INTRAMUSCULAR | Status: DC | PRN
  Administered 2016-07-20: 40 mL via INTRAMUSCULAR

## 2016-07-20 MED ORDER — FENTANYL CITRATE (PF) 100 MCG/2ML IJ SOLN: 25.0000 ug | INTRAMUSCULAR | Status: DC | PRN

## 2016-07-20 MED ORDER — FENTANYL CITRATE (PF) 100 MCG/2ML IJ SOLN
INTRAMUSCULAR | Status: DC | PRN
  Administered 2016-07-20: 100 ug via INTRAVENOUS

## 2016-07-20 MED ORDER — FAMOTIDINE 20 MG PO TABS
ORAL_TABLET | ORAL | Status: AC
  Administered 2016-07-20: 20 mg via ORAL
  Filled 2016-07-20: qty 1

## 2016-07-20 MED ORDER — OXYCODONE HCL 5 MG PO TABS: 5.0000 mg | ORAL_TABLET | Freq: Once | ORAL | Status: DC | PRN

## 2016-07-20 MED ORDER — HYDROCODONE-ACETAMINOPHEN 5-325 MG PO TABS: 1.0000 | ORAL_TABLET | Freq: Four times a day (QID) | ORAL | 0 refills | Status: DC | PRN

## 2016-07-20 MED ORDER — HEPARIN SODIUM (PORCINE) 5000 UNIT/ML IJ SOLN
INTRAMUSCULAR | Status: AC
  Filled 2016-07-20: qty 1

## 2016-07-20 MED ORDER — SODIUM CHLORIDE 0.9 % IV SOLN
INTRAVENOUS | Status: DC
  Administered 2016-07-20 (×2): via INTRAVENOUS

## 2016-07-20 MED ORDER — ONDANSETRON HCL 4 MG/2ML IJ SOLN
INTRAMUSCULAR | Status: DC | PRN
  Administered 2016-07-20: 4 mg via INTRAVENOUS

## 2016-07-20 MED ORDER — CEFAZOLIN SODIUM-DEXTROSE 2-4 GM/100ML-% IV SOLN
INTRAVENOUS | Status: AC
  Administered 2016-07-20: 2 g via INTRAVENOUS
  Filled 2016-07-20: qty 100

## 2016-07-20 SURGICAL SUPPLY — 58 items
APPLIER CLIP 11 MED OPEN (CLIP)
APPLIER CLIP 9.375 SM OPEN (CLIP)
BAG DECANTER FOR FLEXI CONT (MISCELLANEOUS) ×3 IMPLANT
BLADE SURG SZ11 CARB STEEL (BLADE) ×3 IMPLANT
BOOT SUTURE AID YELLOW STND (SUTURE) ×3 IMPLANT
BRUSH SCRUB 4% CHG (MISCELLANEOUS) ×3 IMPLANT
CANISTER SUCT 1200ML W/VALVE (MISCELLANEOUS) ×3 IMPLANT
CHLORAPREP W/TINT 26ML (MISCELLANEOUS) ×3 IMPLANT
CLIP APPLIE 11 MED OPEN (CLIP) IMPLANT
CLIP APPLIE 9.375 SM OPEN (CLIP) IMPLANT
DERMABOND ADVANCED (GAUZE/BANDAGES/DRESSINGS) ×2
DERMABOND ADVANCED .7 DNX12 (GAUZE/BANDAGES/DRESSINGS) ×1 IMPLANT
DRESSING SURGICEL FIBRLLR 1X2 (HEMOSTASIS) ×1 IMPLANT
DRSG SURGICEL FIBRILLAR 1X2 (HEMOSTASIS) ×3
ELECT CAUTERY BLADE 6.4 (BLADE) ×3 IMPLANT
ELECT REM PT RETURN 9FT ADLT (ELECTROSURGICAL) ×3
ELECTRODE REM PT RTRN 9FT ADLT (ELECTROSURGICAL) ×1 IMPLANT
GEL ULTRASOUND 20GR AQUASONIC (MISCELLANEOUS) IMPLANT
GLOVE BIO SURGEON STRL SZ7 (GLOVE) ×9 IMPLANT
GLOVE INDICATOR 7.5 STRL GRN (GLOVE) ×6 IMPLANT
GLOVE SURG SYN 8.0 (GLOVE) ×3 IMPLANT
GOWN STRL REUS W/ TWL LRG LVL3 (GOWN DISPOSABLE) ×2 IMPLANT
GOWN STRL REUS W/ TWL XL LVL3 (GOWN DISPOSABLE) ×1 IMPLANT
GOWN STRL REUS W/TWL LRG LVL3 (GOWN DISPOSABLE) ×4
GOWN STRL REUS W/TWL XL LVL3 (GOWN DISPOSABLE) ×2
IV NS 500ML (IV SOLUTION) ×2
IV NS 500ML BAXH (IV SOLUTION) ×1 IMPLANT
KIT RM TURNOVER STRD PROC AR (KITS) ×3 IMPLANT
LABEL OR SOLS (LABEL) ×3 IMPLANT
LOOP RED MAXI  1X406MM (MISCELLANEOUS) ×2
LOOP VESSEL MAXI 1X406 RED (MISCELLANEOUS) ×1 IMPLANT
LOOP VESSEL MINI 0.8X406 BLUE (MISCELLANEOUS) ×2 IMPLANT
LOOPS BLUE MINI 0.8X406MM (MISCELLANEOUS) ×4
NEEDLE FILTER BLUNT 18X 1/2SAF (NEEDLE) ×2
NEEDLE FILTER BLUNT 18X1 1/2 (NEEDLE) ×1 IMPLANT
NEEDLE HYPO 30X.5 LL (NEEDLE) IMPLANT
NS IRRIG 500ML POUR BTL (IV SOLUTION) IMPLANT
PACK EXTREMITY ARMC (MISCELLANEOUS) ×3 IMPLANT
PAD PREP 24X41 OB/GYN DISP (PERSONAL CARE ITEMS) ×3 IMPLANT
PUNCH SURGICAL ROTATE 2.7MM (MISCELLANEOUS) IMPLANT
STOCKINETTE STRL 4IN 9604848 (GAUZE/BANDAGES/DRESSINGS) ×3 IMPLANT
SUT GORETEX CV-6TTC-13 36IN (SUTURE) ×6 IMPLANT
SUT MNCRL+ 5-0 UNDYED PC-3 (SUTURE) ×1 IMPLANT
SUT MONOCRYL 5-0 (SUTURE) ×2
SUT PROLENE 6 0 BV (SUTURE) ×12 IMPLANT
SUT PROLENE 7 0 BV 1 (SUTURE) ×33 IMPLANT
SUT SILK 2 0 (SUTURE) ×2
SUT SILK 2 0 SH (SUTURE) ×3 IMPLANT
SUT SILK 2-0 18XBRD TIE 12 (SUTURE) ×1 IMPLANT
SUT SILK 3 0 (SUTURE) ×2
SUT SILK 3-0 18XBRD TIE 12 (SUTURE) ×1 IMPLANT
SUT SILK 4 0 (SUTURE) ×2
SUT SILK 4-0 18XBRD TIE 12 (SUTURE) ×1 IMPLANT
SUT VIC AB 3-0 SH 27 (SUTURE) ×2
SUT VIC AB 3-0 SH 27X BRD (SUTURE) ×1 IMPLANT
SYR 20CC LL (SYRINGE) ×3 IMPLANT
SYR 3ML LL SCALE MARK (SYRINGE) ×3 IMPLANT
TOWEL OR 17X26 4PK STRL BLUE (TOWEL DISPOSABLE) IMPLANT

## 2016-07-20 NOTE — Anesthesia Procedure Notes (Signed)
Procedure Name: LMA Insertion Date/Time: 07/20/2016 8:31 AM Performed by: Junious SilkNOLES, Freda Jaquith Pre-anesthesia Checklist: Patient identified, Patient being monitored, Timeout performed, Emergency Drugs available and Suction available Patient Re-evaluated:Patient Re-evaluated prior to inductionOxygen Delivery Method: Circle system utilized Preoxygenation: Pre-oxygenation with 100% oxygen Intubation Type: IV induction Ventilation: Mask ventilation without difficulty LMA: LMA inserted LMA Size: 3.5 Tube type: Oral Number of attempts: 1 Placement Confirmation: positive ETCO2 and breath sounds checked- equal and bilateral Tube secured with: Tape Dental Injury: Teeth and Oropharynx as per pre-operative assessment

## 2016-07-20 NOTE — Progress Notes (Signed)
Contacted by PACU nursing for assistance following AV graft surgery this morning Case discussed with anesthesia physician and nursing at the bedside Patient did well in surgery, no apparent complications Battery was placed on device during Bovie left arm for AV graft  In postop she is a paced, V sensed 60 bpm, periodic non-paced complexes, underlying normal sinus rhythm  Vitals:   07/20/16 1115 07/20/16 1142  BP:  128/72  Pulse: 60 (!) 59  Resp: 10 14  Temp:  97.9 F (36.6 C)   Heart sounds regular, no murmurs appreciated, lungs clear to auscultation, she does have JVD, abdomen distended, tight, no significant lower extremity edema  Pacemaker was interrogated Final report reviewed showing 55% of the time a paced V sensed, the remainder of the time she is normal sinus rhythm, non-paced Pacing strips from interrogation provided showing A sensing appropriately Telemetry also appears to show appropriate atrial sensing  No further changes needed to pacemaker She is a patient of Dr. Harl BowieMassoud Recommended she follow-up in clinic with him in the next several days for postop evaluation    Total encounter time more than 35 minutes  Greater than 50% was spent in counseling and coordination of care with the patient  Signed, Dossie Arbourim Gollan, MD, Ph.D [facility]

## 2016-07-20 NOTE — Discharge Instructions (Signed)

## 2016-07-20 NOTE — Progress Notes (Signed)
Called front desk for pt family to be informed of pt awaiting pacemaker report

## 2016-07-20 NOTE — Anesthesia Post-op Follow-up Note (Cosign Needed)
Anesthesia QCDR form completed.        

## 2016-07-20 NOTE — Op Note (Signed)
     OPERATIVE NOTE   PROCEDURE: left radiocephalic arteriovenous fistula placement  PRE-OPERATIVE DIAGNOSIS: End Stage Renal Disease  POST-OPERATIVE DIAGNOSIS: End Stage Renal Disease  SURGEON: Levora DredgeGregory Lyssa Hackley  ASSISTANT(S): Ms. Raul DelKim Stegmayer  ANESTHESIA: general  ESTIMATED BLOOD LOSS: <50 cc  FINDING(S): Very small thin 2.5 mm cephalic Bradshaw radial artery measuring 1.5 mm  SPECIMEN(S):  none  INDICATIONS:   Melissa Bradshaw is a 66 y.o. female who presents with end stage renal disease.  The patient is scheduled for left brachiocephalic arteriovenous fistula placement.  The patient is aware the risks include but are not limited to: bleeding, infection, steal syndrome, nerve damage, ischemic monomelic neuropathy, failure to mature, and need for additional procedures.  The patient is aware of the risks of the procedure and elects to proceed forward.  DESCRIPTION: After full informed written consent was obtained from the patient, the patient was brought back to the operating room and placed supine upon the operating table.  Prior to induction, the patient received IV antibiotics.   After obtaining adequate anesthesia, the patient was then prepped and draped in the standard fashion for a left arm access procedure.    A linear incision was then created midway between the radial impulse and the cephalic Bradshaw. The cephalic Bradshaw was then identified and dissected circumferentially. It was marked with a surgical marker.    Attention was then turned to the radial artery which was exposed through the same incision and looped proximally and distally. Side branches were controlled with 4-0 silk ties.  The distal segment of the Bradshaw was ligated with a  2-0 silk, and the Bradshaw was transected.  The proximal segment was interrogated with serial dilators.  The Bradshaw accepted up to a 2.5 mm dilator without any difficulty. Heparinized saline was infused into the Bradshaw and clamped it with a small bulldog.   At this point, I reset my exposure of the brachial artery and controlled the artery with vessel loops proximally and distally.  An arteriotomy was then made with a #11 blade, and extended with a Potts scissor.  Heparinized saline was injected proximal and distal into the radial artery.  The Bradshaw was then approximated to the artery while the artery was in its native bed and subsequently the Bradshaw was beveled using Potts scissors. The Bradshaw was then sewn to the artery in an end-to-side configuration with a running stitch of 6-0 Prolene.  Prior to completing this anastomosis Flushing maneuvers were performed and the artery was allowed to forward and back bleed.  There was no evidence of clot from any vessels.  I completed the anastomosis in the usual fashion and then released all vessel loops and clamps.    There was Melissa Bradshaw  thrill in the venous outflow, and there was 1+ palpable radial pulse.  At this point, I irrigated out the surgical wound.  There was no further active bleeding.  The subcutaneous tissue was reapproximated with a running stitch of 3-0 Vicryl.  The skin was then reapproximated with a running subcuticular stitch of 4-0 Vicryl.  The skin was then cleaned, dried, and reinforced with Dermabond.    The patient tolerated this procedure well.   COMPLICATIONS: None  CONDITION: Melissa HatchetGood   Melissa Bradshaw [facility]  Office: [phone]   07/20/2016, 9:55 AM

## 2016-07-20 NOTE — Anesthesia Preprocedure Evaluation (Signed)
Anesthesia Evaluation  Patient identified by MRN, date of birth, ID band Patient awake    Reviewed: Allergy & Precautions, H&P , NPO status , Patient's Chart, lab work & pertinent test results  History of Anesthesia Complications Negative for: history of anesthetic complications  Airway Mallampati: III  TM Distance: >3 FB Neck ROM: full    Dental no notable dental hx. (+) Poor Dentition, Chipped, Missing, Partial Upper, Partial Lower   Pulmonary neg shortness of breath, former smoker,    Pulmonary exam normal breath sounds clear to auscultation       Cardiovascular Exercise Tolerance: Good hypertension, (-) angina+ CAD, + Peripheral Vascular Disease and +CHF  (-) Past MI and (-) DOE Normal cardiovascular exam+ pacemaker  Rhythm:regular Rate:Normal     Neuro/Psych  Headaches, negative psych ROS   GI/Hepatic Neg liver ROS, PUD,   Endo/Other  negative endocrine ROS  Renal/GU Renal disease     Musculoskeletal  (+) Arthritis ,   Abdominal   Peds  Hematology negative hematology ROS (+)   Anesthesia Other Findings Past Medical History: No date: Anemia No date: Aneurysm (HCC)     [place] No date: Arthritis No date: Cardiac pacemaker No date: CHF (congestive heart failure) (HCC) No date: Chronic kidney disease     Comment: ESRD No date: Constipation No date: Coronary artery disease No date: Dialysis patient (HCC)     Comment: Tues, Thurs, Sat No date: Gastric ulcer No date: Glaucoma (increased eye pressure) No date: Headache No date: Hypertension No date: Presence of permanent cardiac pacemaker  Past Surgical History: No date: ANEURYSM COILING No date: COLONOSCOPY     Comment: 2013 No date: INSERT / REPLACE / REMOVE PACEMAKER No date: PACEMAKER PLACEMENT 06/02/2016: PERIPHERAL VASCULAR CATHETERIZATION N/A     Comment: Procedure: Dialysis/Perma Catheter Insertion;          Surgeon: Annice NeedyJason S Dew, MD;  Location: [facility]               INVASIVE CV LAB;  Service: Cardiovascular;                Laterality: N/A;  BMI    Body Mass Index:  20.32 kg/m      Reproductive/Obstetrics negative OB ROS                             Anesthesia Physical Anesthesia Plan  ASA: III  Anesthesia Plan: General LMA   Post-op Pain Management:    Induction:   Airway Management Planned:   Additional Equipment:   Intra-op Plan:   Post-operative Plan:   Informed Consent: I have reviewed the patients History and Physical, chart, labs and discussed the procedure including the risks, benefits and alternatives for the proposed anesthesia with the patient or authorized representative who has indicated his/her understanding and acceptance.   Dental Advisory Given  Plan Discussed with: Anesthesiologist, CRNA and Surgeon  Anesthesia Plan Comments:         Anesthesia Quick Evaluation

## 2016-07-20 NOTE — Anesthesia Postprocedure Evaluation (Signed)
Anesthesia Post Note  Patient: Melissa SchroederCaroline J Magloire  Procedure(s) Performed: Procedure(s) (LRB): ARTERIOVENOUS (AV) FISTULA CREATION (Left)  Patient location during evaluation: PACU Anesthesia Type: General Level of consciousness: awake and alert Pain management: pain level controlled Vital Signs Assessment: post-procedure vital signs reviewed and stable Respiratory status: spontaneous breathing, nonlabored ventilation, respiratory function stable and patient connected to nasal cannula oxygen Cardiovascular status: blood pressure returned to baseline and stable Postop Assessment: no signs of nausea or vomiting Anesthetic complications: no     Last Vitals:  Vitals:   07/20/16 1142 07/20/16 1157  BP: 128/72 140/76  Pulse: (!) 59 62  Resp: 14 16  Temp: 36.6 C 36.3 C    Last Pain:  Vitals:   07/20/16 1157  TempSrc: Tympanic                 Cleda MccreedyJoseph K Gavyn Zoss

## 2016-07-20 NOTE — H&P (Signed)
[facility] History & Physical Update  The patient was interviewed and re-examined.  The patient's previous History and Physical has been reviewed and is unchanged.  There is no change in the plan of care. We plan to proceed with the scheduled procedure.  Levora DredgeGregory Jaimey Franchini, MD  07/20/2016, 7:31 AM

## 2016-07-20 NOTE — Progress Notes (Signed)
Dr Lindajo Royalgollin in to see pt   Okay to go home after seeing report of pacemaker

## 2016-07-20 NOTE — Transfer of Care (Signed)
Immediate Anesthesia Transfer of Care Note  Patient: Tristan SchroederCaroline J Katona  Procedure(s) Performed: Procedure(s): ARTERIOVENOUS (AV) FISTULA CREATION (Left)  Patient Location: PACU  Anesthesia Type:General  Level of Consciousness: sedated  Airway & Oxygen Therapy: Patient Spontanous Breathing and Patient connected to face mask oxygen  Post-op Assessment: Report given to RN and Post -op Vital signs reviewed and stable  Post vital signs: Reviewed and stable  Last Vitals:  Vitals:   07/20/16 0633  BP: (!) 161/91  Pulse: 86  Resp: 12  Temp: 36.6 C    Last Pain:  Vitals:   07/20/16 0633  TempSrc: Oral         Complications: No apparent anesthesia complications

## 2016-07-23 DIAGNOSIS — E8779 Other fluid overload: Secondary | ICD-10-CM | POA: Diagnosis not present

## 2016-07-23 DIAGNOSIS — N186 End stage renal disease: Secondary | ICD-10-CM | POA: Diagnosis not present

## 2016-07-23 DIAGNOSIS — Z992 Dependence on renal dialysis: Secondary | ICD-10-CM | POA: Diagnosis not present

## 2016-07-26 DIAGNOSIS — Z992 Dependence on renal dialysis: Secondary | ICD-10-CM | POA: Diagnosis not present

## 2016-07-26 DIAGNOSIS — N186 End stage renal disease: Secondary | ICD-10-CM | POA: Diagnosis not present

## 2016-07-26 DIAGNOSIS — E8779 Other fluid overload: Secondary | ICD-10-CM | POA: Diagnosis not present

## 2016-07-28 DIAGNOSIS — Z992 Dependence on renal dialysis: Secondary | ICD-10-CM | POA: Diagnosis not present

## 2016-07-28 DIAGNOSIS — N186 End stage renal disease: Secondary | ICD-10-CM | POA: Diagnosis not present

## 2016-07-28 DIAGNOSIS — E8779 Other fluid overload: Secondary | ICD-10-CM | POA: Diagnosis not present

## 2016-07-30 DIAGNOSIS — N186 End stage renal disease: Secondary | ICD-10-CM | POA: Diagnosis not present

## 2016-07-30 DIAGNOSIS — Z992 Dependence on renal dialysis: Secondary | ICD-10-CM | POA: Diagnosis not present

## 2016-07-30 DIAGNOSIS — E8779 Other fluid overload: Secondary | ICD-10-CM | POA: Diagnosis not present

## 2016-08-02 DIAGNOSIS — N186 End stage renal disease: Secondary | ICD-10-CM | POA: Diagnosis not present

## 2016-08-02 DIAGNOSIS — Z992 Dependence on renal dialysis: Secondary | ICD-10-CM | POA: Diagnosis not present

## 2016-08-02 DIAGNOSIS — E8779 Other fluid overload: Secondary | ICD-10-CM | POA: Diagnosis not present

## 2016-08-03 DIAGNOSIS — N186 End stage renal disease: Secondary | ICD-10-CM | POA: Diagnosis not present

## 2016-08-03 DIAGNOSIS — Z992 Dependence on renal dialysis: Secondary | ICD-10-CM | POA: Diagnosis not present

## 2016-08-04 DIAGNOSIS — N186 End stage renal disease: Secondary | ICD-10-CM | POA: Diagnosis not present

## 2016-08-04 DIAGNOSIS — Z992 Dependence on renal dialysis: Secondary | ICD-10-CM | POA: Diagnosis not present

## 2016-08-08 ENCOUNTER — Encounter: Payer: Self-pay | Admitting: Gastroenterology

## 2016-08-08 ENCOUNTER — Ambulatory Visit (INDEPENDENT_AMBULATORY_CARE_PROVIDER_SITE_OTHER): Payer: Medicare Other | Admitting: Gastroenterology

## 2016-08-08 VITALS — BP 134/71 | HR 79 | Temp 98.9°F | Ht 65.5 in | Wt 134.0 lb

## 2016-08-08 DIAGNOSIS — R188 Other ascites: Secondary | ICD-10-CM

## 2016-08-08 DIAGNOSIS — D649 Anemia, unspecified: Secondary | ICD-10-CM | POA: Insufficient documentation

## 2016-08-08 DIAGNOSIS — N184 Chronic kidney disease, stage 4 (severe): Secondary | ICD-10-CM | POA: Insufficient documentation

## 2016-08-08 NOTE — Progress Notes (Signed)
Gastroenterology Consultation  Referring Provider:     Corky Downs, MD Primary Care Physician:  Corky Downs, MD Primary Gastroenterologist:  Dr. Servando Snare     Reason for Consultation:     Ascites        HPI:   Melissa Bradshaw is a 66 y.o. y/o female referred for consultation & management of Ascites by Dr. Corky Downs, MD.  This patient comes in today after reporting that she has had progressive ascites over the last few months.  The patient has had imaging that showed a small amount of ascites with progression to a distended abdomen at the present time.  The patient denies any alcohol abuse in her history.  The patient is on dialysis.  There is no report of any unexplained weight loss.  She also reports that the abdomen is uncomfortable due to its distention.  Despite no history of any liver disease and a history of chronic renal insufficiency the patient has been sent to me for her ascites.  Past Medical History:  Diagnosis Date  . Anemia   . Aneurysm (HCC)    Cerebral, [facility] [place], [place]  . Arthritis   . Cardiac pacemaker   . CHF (congestive heart failure) (HCC)   . Chronic kidney disease    ESRD  . Constipation   . Coronary artery disease   . Dialysis patient (HCC)    Tues, Thurs, Sat  . Gastric ulcer   . Glaucoma (increased eye pressure)   . Headache   . Hypertension   . Presence of permanent cardiac pacemaker     Past Surgical History:  Procedure Laterality Date  . ANEURYSM COILING    . AV FISTULA PLACEMENT Left 07/20/2016   Procedure: ARTERIOVENOUS (AV) FISTULA CREATION;  Surgeon: Renford Dills, MD;  Location: [facility] ORS;  Service: Vascular;  Laterality: Left;  . COLONOSCOPY     2013  . INSERT / REPLACE / REMOVE PACEMAKER    . PACEMAKER PLACEMENT    . PERIPHERAL VASCULAR CATHETERIZATION N/A 06/02/2016   Procedure: Dialysis/Perma Catheter Insertion;  Surgeon: Annice Needy, MD;  Location: [facility] INVASIVE CV LAB;  Service: Cardiovascular;  Laterality: N/A;     Prior to Admission medications   Medication Sig Start Date End Date Taking? Authorizing Provider  amLODipine (NORVASC) 10 MG tablet Take 10 mg by mouth daily.   Yes Historical Provider, MD  aspirin 81 MG tablet Take 81 mg by mouth daily.   Yes Historical Provider, MD  furosemide (LASIX) 20 MG tablet  05/17/16  Yes Historical Provider, MD  HYDROcodone-acetaminophen (NORCO) 5-325 MG tablet Take 1-2 tablets by mouth every 6 (six) hours as needed for moderate pain or severe pain. 07/20/16  Yes Renford Dills, MD  labetalol (NORMODYNE) 200 MG tablet Take 1 tablet (200 mg total) by mouth every morning. 06/07/16  Yes Wyatt Haste, MD  latanoprost (XALATAN) 0.005 % ophthalmic solution Place 1 drop into both eyes at bedtime.   Yes Historical Provider, MD  sevelamer carbonate (RENVELA) 800 MG tablet  07/28/16  Yes Historical Provider, MD  hydrALAZINE (APRESOLINE) 25 MG tablet Take 1 tablet (25 mg total) by mouth every 8 (eight) hours. Patient not taking: Reported on 07/13/2016 06/07/16 07/07/16  Wyatt Haste, MD    Family History  Problem Relation Age of Onset  . Hypertension Mother   . Diabetes Father   . Cancer Sister   . Stroke Sister   . Hypertension Sister      Social History  Substance Use Topics  . Smoking status: Former Smoker    Packs/day: 0.25    Years: 41.00    Types: Cigarettes  . Smokeless tobacco: Never Used  . Alcohol use No    Allergies as of 08/08/2016 - Review Complete 08/08/2016  Allergen Reaction Noted  . Iodine Other (See Comments) 10/28/2014  . Ivp dye [iodinated diagnostic agents] Rash 10/28/2014    Review of Systems:    All systems reviewed and negative except where noted in HPI.   Physical Exam:  BP 134/71   Pulse 79   Temp 98.9 F (37.2 C) (Oral)   Ht 5' 5.5" (1.664 m)   Wt 134 lb (60.8 kg)   BMI 21.96 kg/m  No LMP recorded. Patient is postmenopausal. Psych:  Alert and cooperative. Normal mood and affect. General:   Alert,  Thin, pleasant and  cooperative in NAD Head:  Normocephalic and atraumatic. Eyes:  Sclera clear, no icterus.   Conjunctiva pink. Ears:  Normal auditory acuity. Nose:  No deformity, discharge, or lesions. Mouth:  No deformity or lesions,oropharynx pink & moist. Neck:  Supple; no masses or thyromegaly. Lungs:  Respirations even and unlabored.  Clear throughout to auscultation.   No wheezes, crackles, or rhonchi. No acute distress. Heart:  Regular rate and rhythm; no murmurs, clicks, rubs, or gallops. Abdomen:  Normal bowel sounds.  No bruits.  Soft, non-tender and Positive distention with a fluid wave and shifting dullness without masses, hepatosplenomegaly or hernias noted.  No guarding or rebound tenderness.  Negative Carnett sign.   Rectal:  Deferred.  Msk:  Symmetrical without gross deformities.  Good, equal movement & strength bilaterally. Pulses:  Normal pulses noted. Extremities:  No clubbing or edema.  No cyanosis. Neurologic:  Alert and oriented x3;  grossly normal neurologically. Skin:  Intact without significant lesions or rashes.  No jaundice. Lymph Nodes:  No significant cervical adenopathy. Psych:  Alert and cooperative. Normal mood and affect.  Imaging Studies: No results found.  Assessment and Plan:   Melissa Bradshaw is a 66 y.o. y/o female who comes in with ascites who denies any history of alcohol abuse and no imaging shows any sign of cirrhosis. The differential diagnosis includes portal hypertension and non-portal hypertension causes of ascites.  Carcinomatosis and nephrotic syndrome can cause ascites.  Congestive heart failure can also cause the patient to have portal hypertension.  The patient will have her blood sent off for a CBC CA 125 and serum albumin.  She will also have her abdomen tablets for cytology and ascites albumin.  If the patient's serum ascites albumin gradient is greater than 1.1 then it is due to portal hypertension otherwise another cause for her ascites should be sought.   The patient has been explaining the plan agrees with it.    Midge Miniumarren Jayni Prescher, MD. Clementeen GrahamFACG   Note: This dictation was prepared with Dragon dictation along with smaller phrase technology. Any transcriptional errors that result from this process are unintentional.

## 2016-08-08 NOTE — Patient Instructions (Signed)
You will be set [facility] for an [facility]acent[facility]. Orders have been sent to Central scheduling. Once an appointment has been set up, I will contact you with this information.

## 2016-08-09 DIAGNOSIS — N186 End stage renal disease: Secondary | ICD-10-CM | POA: Diagnosis not present

## 2016-08-09 DIAGNOSIS — Z992 Dependence on renal dialysis: Secondary | ICD-10-CM | POA: Diagnosis not present

## 2016-08-09 NOTE — Discharge Instructions (Signed)
Paracentesis, Care After °Introduction °Refer to this sheet in the next few weeks. These instructions provide you with information about caring for yourself after your procedure. Your health care provider may also give you more specific instructions. Your treatment has been planned according to current medical practices, but problems sometimes occur. Call your health care provider if you have any problems or questions after your procedure. °What can I expect after the procedure? °After your procedure, it is common to have a small amount of clear fluid coming from the puncture site. °Follow these instructions at home: °· Return to your normal activities as told by your health care provider. Ask your health care provider what activities are safe for you. °· Take over-the-counter and prescription medicines only as told by your health care provider. °· Do not take baths, swim, or use a hot tub until your health care provider approves. °· Follow instructions from your health care provider about: °¨ How to take care of your puncture site. °¨ When and how you should change your bandage (dressing). °¨ When you should remove your dressing. °· Check your puncture area every day signs of infection. Watch for: °¨ Redness, swelling, or pain. °¨ Fluid, blood, or pus. °· Keep all follow-up visits as told by your health care provider. This is important. °Contact a health care provider if: °· You have redness, swelling, or pain at your puncture site. °· You start to have more clear fluid coming from your puncture site. °· You have blood or pus coming from your puncture site. °· You have chills. °· You have a fever. °Get help right away if: °· You develop chest pain or shortness of breath. °· You develop increasing pain, discomfort, or swelling in your abdomen. °· You feel dizzy or light-headed or you pass out. °This information is not intended to replace advice given to you by your health care provider. Make sure you discuss any  questions you have with your health care provider. °Document Released: 11/04/2014 Document Revised: 11/26/2015 Document Reviewed: 09/02/2014 °© 2017 Elsevier ° °

## 2016-08-10 ENCOUNTER — Ambulatory Visit
Admission: RE | Admit: 2016-08-10 | Discharge: 2016-08-10 | Disposition: A | Discharge status: Home / Self Care | Payer: Medicare Other | Source: Ambulatory Visit | Attending: Gastroenterology | Admitting: Gastroenterology

## 2016-08-10 DIAGNOSIS — Z992 Dependence on renal dialysis: Secondary | ICD-10-CM | POA: Diagnosis not present

## 2016-08-10 DIAGNOSIS — R188 Other ascites: Secondary | ICD-10-CM | POA: Insufficient documentation

## 2016-08-10 DIAGNOSIS — Q613 Polycystic kidney, unspecified: Secondary | ICD-10-CM | POA: Diagnosis not present

## 2016-08-10 LAB — BODY FLUID CELL COUNT WITH DIFFERENTIAL
EOS FL: 0 %
LYMPHS FL: 23 %
MONOCYTE-MACROPHAGE-SEROUS FLUID: 77 %
Neutrophil Count, Fluid: 0 %
Total Nucleated Cell Count, Fluid: 256 cu mm

## 2016-08-10 LAB — ALBUMIN, FLUID (OTHER): ALBUMIN FL: 2.2 g/dL

## 2016-08-10 NOTE — Procedures (Signed)
Successful US guided paracentesis yielding 4.6 L of serous ascitic fluid. Sample sent to laboratory as requested. EBL: None No immediate post procedural complications.   Katherina RightJay Sawsan Riggio, MD Pager #: [phone]

## 2016-08-11 DIAGNOSIS — N186 End stage renal disease: Secondary | ICD-10-CM | POA: Diagnosis not present

## 2016-08-11 DIAGNOSIS — Z992 Dependence on renal dialysis: Secondary | ICD-10-CM | POA: Diagnosis not present

## 2016-08-11 LAB — CYTOLOGY - NON PAP

## 2016-08-13 DIAGNOSIS — N186 End stage renal disease: Secondary | ICD-10-CM | POA: Diagnosis not present

## 2016-08-13 DIAGNOSIS — Z992 Dependence on renal dialysis: Secondary | ICD-10-CM | POA: Diagnosis not present

## 2016-08-15 ENCOUNTER — Encounter (INDEPENDENT_AMBULATORY_CARE_PROVIDER_SITE_OTHER): Payer: Self-pay | Admitting: Vascular Surgery

## 2016-08-15 ENCOUNTER — Other Ambulatory Visit (INDEPENDENT_AMBULATORY_CARE_PROVIDER_SITE_OTHER): Payer: Self-pay

## 2016-08-15 ENCOUNTER — Encounter (INDEPENDENT_AMBULATORY_CARE_PROVIDER_SITE_OTHER): Payer: Self-pay

## 2016-08-15 ENCOUNTER — Ambulatory Visit (INDEPENDENT_AMBULATORY_CARE_PROVIDER_SITE_OTHER): Payer: Medicare Other | Admitting: Vascular Surgery

## 2016-08-15 VITALS — BP 139/76 | HR 84 | Resp 16 | Ht 65.5 in | Wt 124.0 lb

## 2016-08-15 DIAGNOSIS — I10 Essential (primary) hypertension: Secondary | ICD-10-CM

## 2016-08-15 DIAGNOSIS — N186 End stage renal disease: Secondary | ICD-10-CM

## 2016-08-15 DIAGNOSIS — D631 Anemia in chronic kidney disease: Secondary | ICD-10-CM

## 2016-08-15 DIAGNOSIS — T829XXA Unspecified complication of cardiac and vascular prosthetic device, implant and graft, initial encounter: Secondary | ICD-10-CM | POA: Insufficient documentation

## 2016-08-15 DIAGNOSIS — N185 Chronic kidney disease, stage 5: Secondary | ICD-10-CM

## 2016-08-15 NOTE — Progress Notes (Signed)
MRN : [id number]  Melissa SchroederCaroline J Bradshaw is a [age] y.o. (03/02/1951) female who presents with chief complaint of  Chief Complaint  Patient presents with  . Routine Post Op    Fistula creation follow up  .  History of Present Illness:  The patient is seen for Follow-up status post creation of a radiocephalic fistula on 07/20/2016.  The patient notes the dialysis center says there is no thrill and no bruit.  Current access is via a catheter which is functioningwell.   The patient denies amaurosis fugax or recent TIA symptoms. There are no recent neurological changes noted. The patient denies claudication symptoms or rest pain symptoms. The patient denies history of DVT, PE or superficial thrombophlebitis. The patient denies recent episodes of angina or shortness of breath.    Current Meds  Medication Sig  . amLODipine (NORVASC) 10 MG tablet Take 10 mg by mouth daily.  Marland Kitchen. aspirin 81 MG tablet Take 81 mg by mouth daily.  . furosemide (LASIX) 20 MG tablet   . HYDROcodone-acetaminophen (NORCO) 5-325 MG tablet Take 1-2 tablets by mouth every 6 (six) hours as needed for moderate pain or severe pain.  Marland Kitchen. labetalol (NORMODYNE) 200 MG tablet Take 1 tablet (200 mg total) by mouth every morning.  . latanoprost (XALATAN) 0.005 % ophthalmic solution Place 1 drop into both eyes at bedtime.  . sevelamer carbonate (RENVELA) 800 MG tablet     Past Medical History:  Diagnosis Date  . Anemia   . Aneurysm (HCC)    [place]  . Arthritis   . Cardiac pacemaker   . CHF (congestive heart failure) (HCC)   . Chronic kidney disease    ESRD  . Constipation   . Coronary artery disease   . Dialysis patient (HCC)    Tues, Thurs, Sat  . Gastric ulcer   . Glaucoma (increased eye pressure)   . Headache   . Hypertension   . Presence of permanent cardiac pacemaker     Past Surgical History:  Procedure Laterality Date  . ANEURYSM COILING    . AV FISTULA PLACEMENT Left 07/20/2016   Procedure: ARTERIOVENOUS (AV) FISTULA CREATION;  Surgeon: Renford DillsGregory G Janney Priego, MD;  Location: [facility] ORS;  Service: Vascular;  Laterality: Left;  . COLONOSCOPY     2013  . INSERT / REPLACE / REMOVE PACEMAKER    . PACEMAKER PLACEMENT    . PERIPHERAL VASCULAR CATHETERIZATION N/A 06/02/2016   Procedure: Dialysis/Perma Catheter Insertion;  Surgeon: Annice NeedyJason S Dew, MD;  Location: [facility] INVASIVE CV LAB;  Service: Cardiovascular;  Laterality: N/A;    Social History Social History  Substance Use Topics  . Smoking status: Former Smoker    Packs/day: 0.25    Years: 41.00    Types: Cigarettes  . Smokeless tobacco: Never Used  . Alcohol use No    Family History Family History  Problem Relation Age of Onset  . Hypertension Mother   . Diabetes Father   . Cancer Sister   . Stroke Sister   . Hypertension Sister   No family history of bleeding/clotting disorders, porphyria or autoimmune disease   Allergies  Allergen Reactions  . Iodine Other (See Comments)    Unknown reaction  . Ivp Dye [Iodinated Diagnostic Agents] Rash     REVIEW OF SYSTEMS (Negative unless checked)  Constitutional: [] Weight loss  [] Fever  [] Chills Cardiac: [] Chest pain   [] Chest pressure   [] Palpitations   [] Shortness of breath when laying flat   [] Shortness  of breath with exertion. Vascular:  [] Pain in legs with walking   [] Pain in legs at rest  [] History of DVT   [] Phlebitis   [] Swelling in legs   [] Varicose veins   [] Non-healing ulcers Pulmonary:   [] Uses home oxygen   [] Productive cough   [] Hemoptysis   [] Wheeze  [] COPD   [] Asthma Neurologic:  [] Dizziness   [] Seizures   [] History of stroke   [] History of TIA  [] Aphasia   [] Vissual changes   [] Weakness or numbness in arm   [] Weakness or numbness in leg Musculoskeletal:   [] Joint swelling   [] Joint pain   [] Low back pain Hematologic:  [] Easy bruising  [] Easy bleeding   [] Hypercoagulable state   [] Anemic Gastrointestinal:  [] Diarrhea   [] Vomiting  [] Gastroesophageal  reflux/heartburn   [] Difficulty swallowing. Genitourinary:  [x] Chronic kidney disease   [] Difficult urination  [] Frequent urination   [] Blood in urine Skin:  [] Rashes   [] Ulcers  Psychological:  [] History of anxiety   []  History of major depression.  Physical Examination  Vitals:   08/15/16 0936  BP: 139/76  Pulse: 84  Resp: 16  Weight: 124 lb (56.2 kg)  Height: 5' 5.5" (1.664 m)   Body mass index is 20.32 kg/m. Gen: WD/WN, NAD Head: [place]/AT, No temporalis wasting.  Ear/Nose/Throat: Hearing grossly intact, nares w/o erythema or drainage, poor dentition Eyes: PER, EOMI, sclera nonicteric.  Neck: Supple, no masses.  No bruit or JVD.  Pulmonary:  Good air movement, clear to auscultation bilaterally, no use of accessory muscles.  Cardiac: RRR, normal S1, S2, no Murmurs. Vascular: Left radiocephalic fistula and no thrill no bruit  Vessel Right Left  Radial Palpable Palpable  Ulnar Palpable Palpable  Brachial Palpable Palpable  Carotid Palpable Palpable  Gastrointestinal: soft, non-distended. No guarding/no peritoneal signs.  Musculoskeletal: M/S 5/5 throughout.  No deformity or atrophy.  Neurologic: CN 2-12 intact. Pain and light touch intact in extremities.  Symmetrical.  Speech is fluent. Motor exam as listed above. Psychiatric: Judgment intact, Mood & affect appropriate for pt's clinical situation. Dermatologic: No rashes or ulcers noted.  No changes consistent with cellulitis. Lymph : No Cervical lymphadenopathy, no lichenification or skin changes of chronic lymphedema.  CBC Lab Results  Component Value Date   WBC 5.0 07/13/2016   HGB 8.5 (L) 07/20/2016   HCT 25.0 (L) 07/20/2016   MCV 82.6 07/13/2016   PLT 349 07/13/2016    BMET    Component Value Date/Time   NA 143 07/20/2016 0640   NA 141 01/30/2014 1505   K 4.1 07/20/2016 0640   K 5.0 01/30/2014 1505   CL 102 07/13/2016 1102   CL 116 (H) 01/30/2014 1505   CO2 25 07/13/2016 1102   CO2 17 (L) 01/30/2014 1505    GLUCOSE 96 07/20/2016 0640   GLUCOSE 93 01/30/2014 1505   BUN 46 (H) 07/13/2016 1102   BUN 50 (H) 06/19/2014 1413   CREATININE 4.00 (H) 07/13/2016 1102   CREATININE 3.33 (H) 06/19/2014 1413   CALCIUM 8.4 (L) 07/13/2016 1102   CALCIUM 8.8 01/30/2014 1505   GFRNONAA 11 (L) 07/13/2016 1102   GFRNONAA 15 (L) 06/19/2014 1413   GFRNONAA 14 (L) 01/30/2014 1505   GFRAA 12 (L) 07/13/2016 1102   GFRAA 18 (L) 06/19/2014 1413   GFRAA 16 (L) 01/30/2014 1505   CrCl cannot be calculated (Patient's most recent lab result is older than the maximum 21 days allowed.).  COAG Lab Results  Component Value Date   INR 1.11 07/13/2016  Radiology US Paracentesis  Result Date: 08/10/2016 INDICATION: History of polycystic kidney disease, on dialysis, now with abdominal ascites. Please perform ultrasound-guided paracentesis for diagnostic and therapeutic purposes. EXAM: ULTRASOUND-GUIDED PARACENTESIS COMPARISON:  CT abdomen pelvis - 06/05/2016 MEDICATIONS: None. COMPLICATIONS: None immediate. TECHNIQUE: Informed written consent was obtained from the patient after a discussion of the risks, benefits and alternatives to treatment. A timeout was performed prior to the initiation of the procedure. Initial ultrasound scanning demonstrates a moderate to large amount of ascites within the right lower abdominal quadrant. The right lower abdomen was prepped and draped in the usual sterile fashion. 1% lidocaine with epinephrine was used for local anesthesia. An ultrasound image was saved for documentation purposed. An 8 Fr Safe-T-Centesis catheter was introduced. The paracentesis was performed. The catheter was removed and a dressing was applied. The patient tolerated the procedure well without immediate post procedural complication. FINDINGS: A total of approximately 4.6 liters of serous fluid was removed. Samples were sent to the laboratory as requested by the clinical team. IMPRESSION: Successful ultrasound-guided  paracentesis yielding 4.6 liters of peritoneal fluid. Electronically Signed   By: Simonne Come M.D.   On: 08/10/2016 16:13     Assessment/Plan 1. Essential (primary) hypertension Recommend:  At this time the patient does not have appropriate extremity access for dialysis. Her radiocephalic fistula is clearly thrombosed. Both her artery and vein were noted to be marginal at the time of surgery I do not feel that revision would be a prudent choice.  Patient should have a left brachial axillary AV graft created.  The risks, benefits and alternative therapies were reviewed in detail with the patient.  All questions were answered.  The patient agrees to proceed with surgery.    2. End stage renal disease (HCC) Continue dialysis via her catheter without interruption.   3. Anemia of chronic renal failure, stage 5 (HCC) Continue Procrit as ordered.   Levora Dredge, MD  08/15/2016 9:48 AM

## 2016-08-16 DIAGNOSIS — Z992 Dependence on renal dialysis: Secondary | ICD-10-CM | POA: Diagnosis not present

## 2016-08-16 DIAGNOSIS — N186 End stage renal disease: Secondary | ICD-10-CM | POA: Diagnosis not present

## 2016-08-18 DIAGNOSIS — N186 End stage renal disease: Secondary | ICD-10-CM | POA: Diagnosis not present

## 2016-08-18 DIAGNOSIS — Z992 Dependence on renal dialysis: Secondary | ICD-10-CM | POA: Diagnosis not present

## 2016-08-19 ENCOUNTER — Encounter
Admission: RE | Admit: 2016-08-19 | Discharge: 2016-08-19 | Disposition: A | Discharge status: Home / Self Care | Payer: Medicare Other | Source: Ambulatory Visit | Attending: Vascular Surgery | Admitting: Vascular Surgery

## 2016-08-19 DIAGNOSIS — R188 Other ascites: Secondary | ICD-10-CM | POA: Insufficient documentation

## 2016-08-19 DIAGNOSIS — Z01818 Encounter for other preprocedural examination: Secondary | ICD-10-CM | POA: Insufficient documentation

## 2016-08-19 LAB — CBC
HEMATOCRIT: 28.2 % — AB (ref 35.0–47.0)
Hemoglobin: 9.6 g/dL — ABNORMAL LOW (ref 12.0–16.0)
MCH: 26.4 pg (ref 26.0–34.0)
MCHC: 33.9 g/dL (ref 32.0–36.0)
MCV: 77.9 fL — AB (ref 80.0–100.0)
PLATELETS: 355 10*3/uL (ref 150–440)
RBC: 3.62 MIL/uL — ABNORMAL LOW (ref 3.80–5.20)
RDW: 20.9 % — ABNORMAL HIGH (ref 11.5–14.5)
WBC: 5.9 10*3/uL (ref 3.6–11.0)

## 2016-08-19 LAB — ELECTROLYTE PANEL
Anion gap: 11 (ref 5–15)
CHLORIDE: 100 mmol/L — AB (ref 101–111)
CO2: 29 mmol/L (ref 22–32)
Potassium: 4.2 mmol/L (ref 3.5–5.1)
Sodium: 140 mmol/L (ref 135–145)

## 2016-08-19 NOTE — Patient Instructions (Signed)
  Your procedure is scheduled on: Friday Feb. 23, 2018. Report to Same Day Surgery. To find out your arrival time please call [phone] between 1PM - 3PM on Thursday Feb. 22, 2018.  Remember: Instructions that are not followed completely may result in serious medical risk, up to and including death, or upon the discretion of your surgeon and anesthesiologist your surgery may need to be rescheduled.    _x___ 1. Do not eat food or drink liquids after midnight. No gum chewing or hard candies.     ____ 2. No Alcohol for 24 hours before or after surgery.   ____ 3. Bring all medications with you on the day of surgery if instructed.    __x__ 4. Notify your doctor if there is any change in your medical condition     (cold, fever, infections).    _____ 5. No smoking 24 hours prior to surgery.     Do not wear jewelry, make-up, hairpins, clips or nail polish.  Do not wear lotions, powders, or perfumes.   Do not shave 48 hours prior to surgery. Men may shave face and neck.  Do not bring valuables to the hospital.    [facility] not responsible for any belongings or valuables.               Contacts, dentures or bridgework may not be worn into surgery.  Leave your suitcase in the car. After surgery it may be brought to your room.  For patients admitted to the hospital, discharge time is determined by your treatment team.   Patients discharged the day of surgery will not be allowed to drive home.    Please read over the following fact sheets that you were given:   [facility] Pre[facility]  _x___ Take these medicines the morning of surgery with A SIP OF WATER:    1. amLODipine (NORVASC)   2. labetalol (NORMODYNE)   ____ Fleet Enema (as directed)   _x___ Use Sage Wipes as directed on instruction sheet  ____ Use inhalers on the day of surgery and bring to hospital day of surgery  ____ Stop metformin 2 days prior to surgery    ____ Take 1/2 of usual insulin dose the night  before surgery and none on the morning of surgery.   ____ Stop Coumadin/Plavix/aspirin on does not apply.  ____ Stop Anti-inflammatories such as Advil, Aleve, Ibuprofen, Motrin, Naproxen,  Naprosyn, Goodies powders or aspirin products. OK to take Tylenol.   ____ Stop supplements until after surgery.    ____ Bring C-Pap to the hospital.

## 2016-08-20 DIAGNOSIS — N186 End stage renal disease: Secondary | ICD-10-CM | POA: Diagnosis not present

## 2016-08-20 DIAGNOSIS — Z992 Dependence on renal dialysis: Secondary | ICD-10-CM | POA: Diagnosis not present

## 2016-08-23 DIAGNOSIS — N186 End stage renal disease: Secondary | ICD-10-CM | POA: Diagnosis not present

## 2016-08-23 DIAGNOSIS — Z992 Dependence on renal dialysis: Secondary | ICD-10-CM | POA: Diagnosis not present

## 2016-08-24 ENCOUNTER — Other Ambulatory Visit: Payer: Self-pay | Admitting: Nephrology

## 2016-08-24 DIAGNOSIS — R188 Other ascites: Secondary | ICD-10-CM

## 2016-08-25 DIAGNOSIS — Z992 Dependence on renal dialysis: Secondary | ICD-10-CM | POA: Diagnosis not present

## 2016-08-25 DIAGNOSIS — N186 End stage renal disease: Secondary | ICD-10-CM | POA: Diagnosis not present

## 2016-08-25 MED ORDER — CEFAZOLIN IN D5W 1 GM/50ML IV SOLN: 1.0000 g | Freq: Once | INTRAVENOUS | Status: DC

## 2016-08-26 ENCOUNTER — Ambulatory Visit: Payer: Medicare Other | Admitting: Anesthesiology

## 2016-08-26 ENCOUNTER — Encounter: Payer: Self-pay | Admitting: *Deleted

## 2016-08-26 ENCOUNTER — Ambulatory Visit
Admission: RE | Admit: 2016-08-26 | Discharge: 2016-08-26 | Disposition: A | Discharge status: Home / Self Care | Payer: Medicare Other | Source: Ambulatory Visit | Attending: Vascular Surgery | Admitting: Vascular Surgery

## 2016-08-26 ENCOUNTER — Encounter
Admission: RE | Disposition: A | Discharge status: Home / Self Care | Payer: Self-pay | Source: Ambulatory Visit | Attending: Vascular Surgery

## 2016-08-26 DIAGNOSIS — N186 End stage renal disease: Secondary | ICD-10-CM | POA: Diagnosis not present

## 2016-08-26 DIAGNOSIS — I739 Peripheral vascular disease, unspecified: Secondary | ICD-10-CM | POA: Diagnosis not present

## 2016-08-26 DIAGNOSIS — Z950 Presence of cardiac pacemaker: Secondary | ICD-10-CM | POA: Diagnosis not present

## 2016-08-26 DIAGNOSIS — Z8711 Personal history of peptic ulcer disease: Secondary | ICD-10-CM | POA: Diagnosis not present

## 2016-08-26 DIAGNOSIS — I2510 Atherosclerotic heart disease of native coronary artery without angina pectoris: Secondary | ICD-10-CM | POA: Insufficient documentation

## 2016-08-26 DIAGNOSIS — I509 Heart failure, unspecified: Secondary | ICD-10-CM | POA: Insufficient documentation

## 2016-08-26 DIAGNOSIS — M1990 Unspecified osteoarthritis, unspecified site: Secondary | ICD-10-CM | POA: Diagnosis not present

## 2016-08-26 DIAGNOSIS — D631 Anemia in chronic kidney disease: Secondary | ICD-10-CM | POA: Insufficient documentation

## 2016-08-26 DIAGNOSIS — Z8719 Personal history of other diseases of the digestive system: Secondary | ICD-10-CM | POA: Diagnosis not present

## 2016-08-26 DIAGNOSIS — Z87891 Personal history of nicotine dependence: Secondary | ICD-10-CM | POA: Insufficient documentation

## 2016-08-26 DIAGNOSIS — Z992 Dependence on renal dialysis: Secondary | ICD-10-CM | POA: Insufficient documentation

## 2016-08-26 DIAGNOSIS — I132 Hypertensive heart and chronic kidney disease with heart failure and with stage 5 chronic kidney disease, or end stage renal disease: Secondary | ICD-10-CM | POA: Insufficient documentation

## 2016-08-26 HISTORY — PX: AV FISTULA PLACEMENT: SHX1204

## 2016-08-26 LAB — TYPE AND SCREEN
ABO/RH(D): B POS
Antibody Screen: NEGATIVE

## 2016-08-26 LAB — POCT I-STAT 4, (NA,K, GLUC, HGB,HCT)
Glucose, Bld: 97 mg/dL (ref 65–99)
HEMATOCRIT: 32 % — AB (ref 36.0–46.0)
HEMOGLOBIN: 10.9 g/dL — AB (ref 12.0–15.0)
Potassium: 3.7 mmol/L (ref 3.5–5.1)
Sodium: 139 mmol/L (ref 135–145)

## 2016-08-26 LAB — SURGICAL PCR SCREEN
MRSA, PCR: NEGATIVE
Staphylococcus aureus: NEGATIVE

## 2016-08-26 SURGERY — INSERTION OF ARTERIOVENOUS (AV) GORE-TEX GRAFT ARM
Anesthesia: General | Laterality: Left | Wound class: Clean

## 2016-08-26 MED ORDER — LIDOCAINE HCL (PF) 2 % IJ SOLN
INTRAMUSCULAR | Status: AC
  Filled 2016-08-26: qty 2

## 2016-08-26 MED ORDER — CEFAZOLIN IN D5W 1 GM/50ML IV SOLN
INTRAVENOUS | Status: AC
  Administered 2016-08-26: 1 g via INTRAVENOUS
  Filled 2016-08-26: qty 50

## 2016-08-26 MED ORDER — PROPOFOL 10 MG/ML IV BOLUS
INTRAVENOUS | Status: DC | PRN
  Administered 2016-08-26: 100 mg via INTRAVENOUS

## 2016-08-26 MED ORDER — PROPOFOL 500 MG/50ML IV EMUL
INTRAVENOUS | Status: AC
  Filled 2016-08-26: qty 50

## 2016-08-26 MED ORDER — SODIUM CHLORIDE 0.9 % IJ SOLN
INTRAMUSCULAR | Status: AC
  Filled 2016-08-26: qty 100

## 2016-08-26 MED ORDER — PAPAVERINE HCL 30 MG/ML IJ SOLN
INTRAMUSCULAR | Status: AC
  Filled 2016-08-26: qty 2

## 2016-08-26 MED ORDER — HEPARIN SODIUM (PORCINE) 5000 UNIT/ML IJ SOLN
INTRAMUSCULAR | Status: AC
  Filled 2016-08-26: qty 1

## 2016-08-26 MED ORDER — CEFAZOLIN IN D5W 1 GM/50ML IV SOLN
1.0000 g | Freq: Once | INTRAVENOUS | Status: AC
  Administered 2016-08-26: 1 g via INTRAVENOUS

## 2016-08-26 MED ORDER — SODIUM CHLORIDE 0.9 % IV SOLN
INTRAVENOUS | Status: DC
  Administered 2016-08-26: 10:00:00 via INTRAVENOUS

## 2016-08-26 MED ORDER — BUPIVACAINE HCL (PF) 0.5 % IJ SOLN
INTRAMUSCULAR | Status: AC
  Filled 2016-08-26: qty 30

## 2016-08-26 MED ORDER — OXYCODONE-ACETAMINOPHEN 5-325 MG PO TABS: 1.0000 | ORAL_TABLET | Freq: Four times a day (QID) | ORAL | 0 refills | Status: DC | PRN

## 2016-08-26 MED ORDER — SODIUM CHLORIDE 0.9 % IJ SOLN
INTRAMUSCULAR | Status: AC
  Filled 2016-08-26: qty 20

## 2016-08-26 MED ORDER — ONDANSETRON HCL 4 MG/2ML IJ SOLN: 4.0000 mg | Freq: Once | INTRAMUSCULAR | Status: DC | PRN

## 2016-08-26 MED ORDER — MUPIROCIN 2 % EX OINT
1.0000 "application " | TOPICAL_OINTMENT | Freq: Two times a day (BID) | CUTANEOUS | Status: DC
  Filled 2016-08-26: qty 22

## 2016-08-26 MED ORDER — EPHEDRINE SULFATE 50 MG/ML IJ SOLN
INTRAMUSCULAR | Status: DC | PRN
  Administered 2016-08-26 (×4): 10 mg via INTRAVENOUS
  Administered 2016-08-26 (×2): 5 mg via INTRAVENOUS

## 2016-08-26 MED ORDER — ONDANSETRON HCL 4 MG/2ML IJ SOLN
INTRAMUSCULAR | Status: AC
  Filled 2016-08-26: qty 2

## 2016-08-26 MED ORDER — BUPIVACAINE HCL (PF) 0.5 % IJ SOLN
INTRAMUSCULAR | Status: DC | PRN
  Administered 2016-08-26: 27 mL

## 2016-08-26 MED ORDER — MIDAZOLAM HCL 2 MG/2ML IJ SOLN
INTRAMUSCULAR | Status: DC | PRN
  Administered 2016-08-26: 2 mg via INTRAVENOUS

## 2016-08-26 MED ORDER — ONDANSETRON HCL 4 MG/2ML IJ SOLN
INTRAMUSCULAR | Status: DC | PRN
  Administered 2016-08-26: 4 mg via INTRAVENOUS

## 2016-08-26 MED ORDER — SODIUM CHLORIDE 0.9 % IJ SOLN
INTRAMUSCULAR | Status: AC
  Filled 2016-08-26: qty 10

## 2016-08-26 MED ORDER — FENTANYL CITRATE (PF) 100 MCG/2ML IJ SOLN
INTRAMUSCULAR | Status: DC | PRN
  Administered 2016-08-26 (×4): 25 ug via INTRAVENOUS

## 2016-08-26 MED ORDER — DEXAMETHASONE SODIUM PHOSPHATE 10 MG/ML IJ SOLN
INTRAMUSCULAR | Status: AC
  Filled 2016-08-26: qty 1

## 2016-08-26 MED ORDER — EPHEDRINE SULFATE 50 MG/ML IJ SOLN
INTRAMUSCULAR | Status: AC
  Filled 2016-08-26: qty 1

## 2016-08-26 MED ORDER — MIDAZOLAM HCL 2 MG/2ML IJ SOLN
INTRAMUSCULAR | Status: AC
  Filled 2016-08-26: qty 2

## 2016-08-26 MED ORDER — LIDOCAINE HCL (CARDIAC) 20 MG/ML IV SOLN
INTRAVENOUS | Status: DC | PRN
  Administered 2016-08-26: 50 mg via INTRAVENOUS

## 2016-08-26 MED ORDER — FENTANYL CITRATE (PF) 100 MCG/2ML IJ SOLN: 25.0000 ug | INTRAMUSCULAR | Status: DC | PRN

## 2016-08-26 MED ORDER — CHLORHEXIDINE GLUCONATE CLOTH 2 % EX PADS: 6.0000 | MEDICATED_PAD | Freq: Every day | CUTANEOUS | Status: DC

## 2016-08-26 MED ORDER — FENTANYL CITRATE (PF) 100 MCG/2ML IJ SOLN
INTRAMUSCULAR | Status: AC
  Filled 2016-08-26: qty 2

## 2016-08-26 MED ORDER — SODIUM CHLORIDE 0.9 % IV SOLN
INTRAVENOUS | Status: DC | PRN
  Administered 2016-08-26: 13:00:00 100 mL via INTRAMUSCULAR

## 2016-08-26 MED ORDER — PHENYLEPHRINE HCL 10 MG/ML IJ SOLN
INTRAMUSCULAR | Status: DC | PRN
  Administered 2016-08-26: 100 ug via INTRAVENOUS
  Administered 2016-08-26: 200 ug via INTRAVENOUS
  Administered 2016-08-26 (×8): 100 ug via INTRAVENOUS

## 2016-08-26 MED ORDER — SODIUM CHLORIDE 0.9 % IV SOLN
INTRAVENOUS | Status: DC | PRN
  Administered 2016-08-26: 10 ug via INTRAVENOUS
  Administered 2016-08-26: 15 ug via INTRAVENOUS

## 2016-08-26 MED ORDER — DEXAMETHASONE SODIUM PHOSPHATE 10 MG/ML IJ SOLN
INTRAMUSCULAR | Status: DC | PRN
  Administered 2016-08-26: 10 mg via INTRAVENOUS

## 2016-08-26 SURGICAL SUPPLY — 57 items
APPLIER CLIP 11 MED OPEN (CLIP)
APPLIER CLIP 9.375 SM OPEN (CLIP)
BAG COUNTER SPONGE EZ (MISCELLANEOUS) IMPLANT
BAG DECANTER FOR FLEXI CONT (MISCELLANEOUS) ×3 IMPLANT
BLADE SURG SZ11 CARB STEEL (BLADE) ×3 IMPLANT
BOOT SUTURE AID YELLOW STND (SUTURE) ×3 IMPLANT
BRUSH SCRUB 4% CHG (MISCELLANEOUS) ×3 IMPLANT
CANISTER SUCT 1200ML W/VALVE (MISCELLANEOUS) ×3 IMPLANT
CHLORAPREP W/TINT 26ML (MISCELLANEOUS) ×3 IMPLANT
CLIP APPLIE 11 MED OPEN (CLIP) IMPLANT
CLIP APPLIE 9.375 SM OPEN (CLIP) IMPLANT
COUNTER SPONGE BAG EZ (MISCELLANEOUS)
DERMABOND ADVANCED (GAUZE/BANDAGES/DRESSINGS) ×4
DERMABOND ADVANCED .7 DNX12 (GAUZE/BANDAGES/DRESSINGS) ×2 IMPLANT
DRESSING SURGICEL FIBRLLR 1X2 (HEMOSTASIS) ×1 IMPLANT
DRSG SURGICEL FIBRILLAR 1X2 (HEMOSTASIS) ×3
ELECT CAUTERY BLADE 6.4 (BLADE) ×3 IMPLANT
ELECT REM PT RETURN 9FT ADLT (ELECTROSURGICAL) ×3
ELECTRODE REM PT RTRN 9FT ADLT (ELECTROSURGICAL) ×1 IMPLANT
GLOVE BIO SURGEON STRL SZ7 (GLOVE) ×9 IMPLANT
GLOVE INDICATOR 7.5 STRL GRN (GLOVE) ×6 IMPLANT
GLOVE SURG SYN 8.0 (GLOVE) ×3 IMPLANT
GOWN STRL REUS W/ TWL LRG LVL3 (GOWN DISPOSABLE) ×2 IMPLANT
GOWN STRL REUS W/ TWL XL LVL3 (GOWN DISPOSABLE) ×1 IMPLANT
GOWN STRL REUS W/TWL LRG LVL3 (GOWN DISPOSABLE) ×4
GOWN STRL REUS W/TWL XL LVL3 (GOWN DISPOSABLE) ×2
GRAFT PROPATEN STD WALL 4 7X45 (Vascular Products) ×3 IMPLANT
IV NS 500ML (IV SOLUTION) ×2
IV NS 500ML BAXH (IV SOLUTION) ×1 IMPLANT
KIT RM TURNOVER STRD PROC AR (KITS) ×3 IMPLANT
LABEL OR SOLS (LABEL) ×3 IMPLANT
LOOP RED MAXI  1X406MM (MISCELLANEOUS) ×2
LOOP VESSEL MAXI 1X406 RED (MISCELLANEOUS) ×1 IMPLANT
LOOP VESSEL MINI 0.8X406 BLUE (MISCELLANEOUS) ×2 IMPLANT
LOOPS BLUE MINI 0.8X406MM (MISCELLANEOUS) ×4
NEEDLE FILTER BLUNT 18X 1/2SAF (NEEDLE) ×2
NEEDLE FILTER BLUNT 18X1 1/2 (NEEDLE) ×1 IMPLANT
NS IRRIG 500ML POUR BTL (IV SOLUTION) ×3 IMPLANT
PACK EXTREMITY ARMC (MISCELLANEOUS) ×3 IMPLANT
PAD PREP 24X41 OB/GYN DISP (PERSONAL CARE ITEMS) ×3 IMPLANT
PUNCH SURGICAL ROTATE 2.7MM (MISCELLANEOUS) IMPLANT
STOCKINETTE STRL 4IN 9604848 (GAUZE/BANDAGES/DRESSINGS) ×3 IMPLANT
SUT GTX CV-6 30 (SUTURE) ×9 IMPLANT
SUT MNCRL+ 5-0 UNDYED PC-3 (SUTURE) ×1 IMPLANT
SUT MONOCRYL 5-0 (SUTURE) ×2
SUT PROLENE 6 0 BV (SUTURE) ×12 IMPLANT
SUT SILK 2 0 (SUTURE) ×2
SUT SILK 2 0 SH (SUTURE) ×3 IMPLANT
SUT SILK 2-0 18XBRD TIE 12 (SUTURE) ×1 IMPLANT
SUT SILK 3 0 (SUTURE) ×2
SUT SILK 3-0 18XBRD TIE 12 (SUTURE) ×1 IMPLANT
SUT SILK 4 0 (SUTURE) ×2
SUT SILK 4-0 18XBRD TIE 12 (SUTURE) ×1 IMPLANT
SUT VIC AB 3-0 SH 27 (SUTURE) ×2
SUT VIC AB 3-0 SH 27X BRD (SUTURE) ×1 IMPLANT
SYR 20CC LL (SYRINGE) ×3 IMPLANT
SYR 3ML LL SCALE MARK (SYRINGE) ×3 IMPLANT

## 2016-08-26 NOTE — Anesthesia Procedure Notes (Signed)
Procedure Name: LMA Insertion Date/Time: 08/26/2016 11:36 AM Performed by: Omer JackWEATHERLY, [name] Pre-anesthesia Checklist: Patient identified, Patient being monitored, Timeout performed, Emergency Drugs available and Suction available Patient Re-evaluated:Patient Re-evaluated prior to inductionOxygen Delivery Method: Circle system utilized Preoxygenation: Pre-oxygenation with 100% oxygen Intubation Type: IV induction Ventilation: Mask ventilation without difficulty LMA: LMA inserted LMA Size: 3.5 Tube type: Oral Number of attempts: 1 Placement Confirmation: positive ETCO2 and breath sounds checked- equal and bilateral Tube secured with: Tape Dental Injury: Teeth and Oropharynx as per pre-operative assessment

## 2016-08-26 NOTE — H&P (Signed)
[facility] History & Physical Update  The patient was interviewed and re-examined.  The patient's previous History and Physical has been reviewed and is unchanged.  There is no change in the plan of care. We plan to proceed with the scheduled procedure.  Levora DredgeGregory Kenzly Rogoff, MD  08/26/2016, 10:42 AM

## 2016-08-26 NOTE — Anesthesia Preprocedure Evaluation (Signed)
Anesthesia Evaluation  Patient identified by MRN, date of birth, ID band Patient awake    Reviewed: Allergy & Precautions, H&P , NPO status , Patient's Chart, lab work & pertinent test results  History of Anesthesia Complications Negative for: history of anesthetic complications  Airway Mallampati: III  TM Distance: >3 FB Neck ROM: full    Dental no notable dental hx. (+) Poor Dentition, Chipped, Missing, Partial Upper, Partial Lower   Pulmonary neg shortness of breath, former smoker,           Cardiovascular Exercise Tolerance: Good hypertension, (-) angina+ CAD, + Peripheral Vascular Disease and +CHF  (-) Past MI and (-) DOE + pacemaker      Neuro/Psych  Headaches, negative psych ROS   GI/Hepatic Neg liver ROS, PUD,   Endo/Other  negative endocrine ROS  Renal/GU Renal disease     Musculoskeletal  (+) Arthritis ,   Abdominal   Peds  Hematology  (+) Blood dyscrasia, anemia ,   Anesthesia Other Findings Past Medical History: No date: Anemia No date: Aneurysm (HCC)     Comment[place] [facility] [place] No date: Arthritis No date: Cardiac pacemaker No date: CHF (congestive heart failure) (HCC) No date: Chronic kidney disease     Comment: ESRD No date: Constipation No date: Coronary artery disease No date: Dialysis patient (HCC)     Comment: Tues, Thurs, Sat No date: Gastric ulcer No date: Glaucoma (increased eye pressure) No date: Headache No date: Hypertension No date: Presence of permanent cardiac pacemaker  Past Surgical History: No date: ANEURYSM COILING No date: COLONOSCOPY     Comment: 2013 No date: INSERT / REPLACE / REMOVE PACEMAKER No date: PACEMAKER PLACEMENT 06/02/2016: PERIPHERAL VASCULAR CATHETERIZATION N/A     Comment: Procedure: Dialysis/Perma Catheter Insertion;               Surgeon: Annice NeedyJason S Dew, MD;  Location: [facility]               INVASIVE CV LAB;  Service: Cardiovascular;                 Laterality: N/A;  BMI    Body Mass Index:  20.32 kg/m      Reproductive/Obstetrics negative OB ROS                             Anesthesia Physical  Anesthesia Plan  ASA: III  Anesthesia Plan: General LMA   Post-op Pain Management:    Induction:   Airway Management Planned:   Additional Equipment:   Intra-op Plan:   Post-operative Plan:   Informed Consent: I have reviewed the patients History and Physical, chart, labs and discussed the procedure including the risks, benefits and alternatives for the proposed anesthesia with the patient or authorized representative who has indicated his/her understanding and acceptance.   Dental Advisory Given  Plan Discussed with: Anesthesiologist, CRNA and Surgeon  Anesthesia Plan Comments:         Anesthesia Quick Evaluation

## 2016-08-26 NOTE — Op Note (Signed)
OPERATIVE NOTE   PROCEDURE: left brachial axillary arteriovenous graft placement  PRE-OPERATIVE DIAGNOSIS: End Stage Renal Disease  POST-OPERATIVE DIAGNOSIS: End Stage Renal Disease  SURGEON: Levora Dredge  ASSISTANT(S): Mr. Harlene Salts  ANESTHESIA: general  ESTIMATED BLOOD LOSS: <50 cc  FINDING(S): 3-4 mm brachial artery with a 7-8 mm axillary vein  SPECIMEN(S):  none  INDICATIONS:   Melissa Bradshaw is a 66 y.o. female who presents with end stage renal disease.  The patient is scheduled for left brachial axillary AV graft placement.  The patient is aware the risks include but are not limited to: bleeding, infection, steal syndrome, nerve damage, ischemic monomelic neuropathy, failure to mature, and need for additional procedures.  The patient is aware of the risks of the procedure and elects to proceed forward.  DESCRIPTION: After full informed written consent was obtained from the patient, the patient was brought back to the operating room and placed supine upon the operating table.  Prior to induction, the patient received IV antibiotics.   After obtaining adequate anesthesia, the patient was then prepped and draped in the standard fashion for a left arm access procedure.    A linear incision was then created along the medial border of the biceps muscle just proximal to the antecubital crease and the brachial artery which was exposed through. The brachial artery was then looped proximally and distally with Silastic Vesseloops. Side branches were controlled with 4-0 silk ties.  Attention was then turned to the exposure of the axillary vein. Linear incision was then created medial to the proximal portion of the biceps at the level of the anterior axillary crease. The axillary vein was exposed and again looped proximally and distally with Silastic vessel loops. Associated tributaries were also controlled with Silastic Vesseloops.  The Gore tunneler was then delivered  onto the field and a subcutaneous path was made from the arterial incision to the venous incision. A 4-7 tapered PTFE propatent graft by Emeline Darling was then pulled through the subcutaneous tunnel. The arterial 4 mm portion was then approximated to the brachial artery. Brachial artery was controlled proximally and distally with the Silastic Vesseloops. Arteriotomy was made with an 11 blade scalpel and extended with Potts scissors and a 6-0 Prolene stay suture was placed. End graft to side brachial artery anastomosis was then fashioned with running CV 6 suture. Flushing maneuvers were performed suture line was hemostatic and the graft was then assessed for proper position and ease of future cannulation. Heparinized saline was infused into the vein and the graft was clamped with a vascular clamp. With the graft pressurized it was approximated to the axillary vein in its native bed and then marked with a surgical marker. The vein was then delivered into the surgical field and controlled with the Silastic vessel loops. Venotomy was then made with an 11 blade scalpel and extended with Potts scissors and a 6-0 Prolene suture was used as stay suture. The the graft was then sewn to the vein in an end graft to side vein fashion using running CV 6 suture.  Flushing maneuvers were performed and the artery was allowed to forward and back bleed.  Flow was then established through the AV graft  There was good  thrill in the venous outflow, and there was 1+ palpable radial pulse.  At this point, I irrigated out the surgical wounds.  There was no further active bleeding.  The subcutaneous tissue was reapproximated with a running stitch of 3-0 Vicryl.  The  skin was then reapproximated with a running subcuticular stitch of 4-0 Vicryl.  The skin was then cleaned, dried, and reinforced with Dermabond.    The patient tolerated this procedure well.   COMPLICATIONS: None  CONDITION: Good   GregoVelna Hatchetry Melyna Huron [facility]   Office: [phone]   08/26/2016, 1:40 PM

## 2016-08-26 NOTE — Transfer of Care (Signed)
Immediate Anesthesia Transfer of Care Note  Patient: Melissa Bradshaw  Procedure(s) Performed: Procedure(s): INSERTION OF ARTERIOVENOUS (AV) GORE-TEX GRAFT ARM (Left)  Patient Location: PACU  Anesthesia Type:General  Level of Consciousness: patient cooperative and lethargic  Airway & Oxygen Therapy: Patient Spontanous Breathing and Patient connected to face mask oxygen  Post-op Assessment: Report given to RN and Post -op Vital signs reviewed and stable  Post vital signs: Reviewed and stable  Last Vitals:  Vitals:   08/26/16 0936 08/26/16 1340  BP: (!) 139/93 116/64  Pulse: 90 63  Resp: 20 16  Temp: (!) 36.1 C 36.8 C    Last Pain:  Vitals:   08/26/16 1340  TempSrc: Temporal  PainSc: Asleep      Patients Stated Pain Goal: 2 (08/26/16 0936)  Complications: No apparent anesthesia complications

## 2016-08-26 NOTE — Anesthesia Postprocedure Evaluation (Signed)
Anesthesia Post Note  Patient: Melissa Bradshaw  Procedure(s) Performed: Procedure(s) (LRB): INSERTION OF ARTERIOVENOUS (AV) GORE-TEX GRAFT ARM (Left)  Patient location during evaluation: PACU Anesthesia Type: General Level of consciousness: awake and alert Pain management: pain level controlled Vital Signs Assessment: post-procedure vital signs reviewed and stable Respiratory status: spontaneous breathing, nonlabored ventilation, respiratory function stable and patient connected to nasal cannula oxygen Cardiovascular status: blood pressure returned to baseline and stable Postop Assessment: no signs of nausea or vomiting Anesthetic complications: no     Last Vitals:  Vitals:   08/26/16 1408 08/26/16 1420  BP:  (P) 119/76  Pulse: 61 (P) 81  Resp: 17 (P) 14  Temp: 37.1 C (P) 36.4 C    Last Pain:  Vitals:   08/26/16 1408  TempSrc:   PainSc: 0-No pain                 Cleda MccreedyJoseph K Hektor Huston

## 2016-08-26 NOTE — Discharge Instructions (Signed)

## 2016-08-26 NOTE — Anesthesia Post-op Follow-up Note (Cosign Needed)
Anesthesia QCDR form completed.        

## 2016-08-27 DIAGNOSIS — Z992 Dependence on renal dialysis: Secondary | ICD-10-CM | POA: Diagnosis not present

## 2016-08-27 DIAGNOSIS — N186 End stage renal disease: Secondary | ICD-10-CM | POA: Diagnosis not present

## 2016-08-29 ENCOUNTER — Encounter: Payer: Self-pay | Admitting: Vascular Surgery

## 2016-08-29 NOTE — Discharge Instructions (Signed)
Paracentesis, Care After °Introduction °Refer to this sheet in the next few weeks. These instructions provide you with information about caring for yourself after your procedure. Your health care provider may also give you more specific instructions. Your treatment has been planned according to current medical practices, but problems sometimes occur. Call your health care provider if you have any problems or questions after your procedure. °What can I expect after the procedure? °After your procedure, it is common to have a small amount of clear fluid coming from the puncture site. °Follow these instructions at home: °· Return to your normal activities as told by your health care provider. Ask your health care provider what activities are safe for you. °· Take over-the-counter and prescription medicines only as told by your health care provider. °· Do not take baths, swim, or use a hot tub until your health care provider approves. °· Follow instructions from your health care provider about: °¨ How to take care of your puncture site. °¨ When and how you should change your bandage (dressing). °¨ When you should remove your dressing. °· Check your puncture area every day signs of infection. Watch for: °¨ Redness, swelling, or pain. °¨ Fluid, blood, or pus. °· Keep all follow-up visits as told by your health care provider. This is important. °Contact a health care provider if: °· You have redness, swelling, or pain at your puncture site. °· You start to have more clear fluid coming from your puncture site. °· You have blood or pus coming from your puncture site. °· You have chills. °· You have a fever. °Get help right away if: °· You develop chest pain or shortness of breath. °· You develop increasing pain, discomfort, or swelling in your abdomen. °· You feel dizzy or light-headed or you pass out. °This information is not intended to replace advice given to you by your health care provider. Make sure you discuss any  questions you have with your health care provider. °Document Released: 11/04/2014 Document Revised: 11/26/2015 Document Reviewed: 09/02/2014 °© 2017 Elsevier ° °

## 2016-08-30 ENCOUNTER — Ambulatory Visit: Payer: Medicare Other

## 2016-08-30 ENCOUNTER — Encounter: Payer: Self-pay | Admitting: Vascular Surgery

## 2016-08-30 DIAGNOSIS — Z992 Dependence on renal dialysis: Secondary | ICD-10-CM | POA: Diagnosis not present

## 2016-08-30 DIAGNOSIS — N186 End stage renal disease: Secondary | ICD-10-CM | POA: Diagnosis not present

## 2016-08-31 ENCOUNTER — Ambulatory Visit
Admission: RE | Admit: 2016-08-31 | Discharge: 2016-08-31 | Disposition: A | Discharge status: Home / Self Care | Payer: Medicare Other | Source: Ambulatory Visit | Attending: Nephrology | Admitting: Nephrology

## 2016-08-31 DIAGNOSIS — Z992 Dependence on renal dialysis: Secondary | ICD-10-CM | POA: Diagnosis not present

## 2016-08-31 DIAGNOSIS — R188 Other ascites: Secondary | ICD-10-CM | POA: Insufficient documentation

## 2016-08-31 DIAGNOSIS — N186 End stage renal disease: Secondary | ICD-10-CM | POA: Diagnosis not present

## 2016-09-01 DIAGNOSIS — N186 End stage renal disease: Secondary | ICD-10-CM | POA: Diagnosis not present

## 2016-09-01 DIAGNOSIS — Z992 Dependence on renal dialysis: Secondary | ICD-10-CM | POA: Diagnosis not present

## 2016-09-03 DIAGNOSIS — Z992 Dependence on renal dialysis: Secondary | ICD-10-CM | POA: Diagnosis not present

## 2016-09-03 DIAGNOSIS — N186 End stage renal disease: Secondary | ICD-10-CM | POA: Diagnosis not present

## 2016-09-06 DIAGNOSIS — N186 End stage renal disease: Secondary | ICD-10-CM | POA: Diagnosis not present

## 2016-09-06 DIAGNOSIS — Z992 Dependence on renal dialysis: Secondary | ICD-10-CM | POA: Diagnosis not present

## 2016-09-07 ENCOUNTER — Other Ambulatory Visit: Payer: Self-pay | Admitting: Nephrology

## 2016-09-07 DIAGNOSIS — R188 Other ascites: Secondary | ICD-10-CM

## 2016-09-08 DIAGNOSIS — N186 End stage renal disease: Secondary | ICD-10-CM | POA: Diagnosis not present

## 2016-09-08 DIAGNOSIS — Z992 Dependence on renal dialysis: Secondary | ICD-10-CM | POA: Diagnosis not present

## 2016-09-08 NOTE — Discharge Instructions (Signed)
Paracentesis, Care After  Refer to this sheet in the next few weeks. These instructions provide you with information about caring for yourself after your procedure. Your health care provider may also give you more specific instructions. Your treatment has been planned according to current medical practices, but problems sometimes occur. Call your health care provider if you have any problems or questions after your procedure.  What can I expect after the procedure?  After your procedure, it is common to have a small amount of clear fluid coming from the puncture site.  Follow these instructions at home:  · Return to your normal activities as told by your health care provider. Ask your health care provider what activities are safe for you.  · Take over-the-counter and prescription medicines only as told by your health care provider.  · Do not take baths, swim, or use a hot tub until your health care provider approves.  · Follow instructions from your health care provider about:  ? How to take care of your puncture site.  ? When and how you should change your bandage (dressing).  ? When you should remove your dressing.  · Check your puncture area every day signs of infection. Watch for:  ? Redness, swelling, or pain.  ? Fluid, blood, or pus.  · Keep all follow-up visits as told by your health care provider. This is important.  Contact a health care provider if:  · You have redness, swelling, or pain at your puncture site.  · You start to have more clear fluid coming from your puncture site.  · You have blood or pus coming from your puncture site.  · You have chills.  · You have a fever.  Get help right away if:  · You develop chest pain or shortness of breath.  · You develop increasing pain, discomfort, or swelling in your abdomen.  · You feel dizzy or light-headed or you pass out.  This information is not intended to replace advice given to you by your health care provider. Make sure you discuss any questions you  have with your health care provider.  Document Released: 11/04/2014 Document Revised: 11/26/2015 Document Reviewed: 09/02/2014  Elsevier Interactive Patient Education © 2017 [employer]

## 2016-09-09 ENCOUNTER — Other Ambulatory Visit: Payer: Self-pay | Admitting: Nephrology

## 2016-09-09 DIAGNOSIS — R188 Other ascites: Secondary | ICD-10-CM

## 2016-09-10 DIAGNOSIS — N186 End stage renal disease: Secondary | ICD-10-CM | POA: Diagnosis not present

## 2016-09-10 DIAGNOSIS — Z992 Dependence on renal dialysis: Secondary | ICD-10-CM | POA: Diagnosis not present

## 2016-09-12 ENCOUNTER — Ambulatory Visit
Admission: RE | Admit: 2016-09-12 | Discharge: 2016-09-12 | Disposition: A | Discharge status: Home / Self Care | Payer: Medicare Other | Source: Ambulatory Visit | Attending: Nephrology | Admitting: Nephrology

## 2016-09-12 ENCOUNTER — Encounter (INDEPENDENT_AMBULATORY_CARE_PROVIDER_SITE_OTHER): Payer: Medicare Other | Admitting: Vascular Surgery

## 2016-09-13 DIAGNOSIS — Z992 Dependence on renal dialysis: Secondary | ICD-10-CM | POA: Diagnosis not present

## 2016-09-13 DIAGNOSIS — N186 End stage renal disease: Secondary | ICD-10-CM | POA: Diagnosis not present

## 2016-09-15 DIAGNOSIS — N186 End stage renal disease: Secondary | ICD-10-CM | POA: Diagnosis not present

## 2016-09-15 DIAGNOSIS — Z992 Dependence on renal dialysis: Secondary | ICD-10-CM | POA: Diagnosis not present

## 2016-09-16 ENCOUNTER — Telehealth: Payer: Self-pay

## 2016-09-16 NOTE — Telephone Encounter (Signed)
-----   Message from Midge Miniumarren Wohl, MD sent at 08/16/2016  9:48 AM EST ----- Still waiting for the patient's albumin of her blood results

## 2016-09-16 NOTE — Telephone Encounter (Signed)
Spoke with pt and requested she go have the albumin lab test done. Pt has labs done every Tuesday. She will see if they can complete this during that visit.

## 2016-09-17 DIAGNOSIS — Z992 Dependence on renal dialysis: Secondary | ICD-10-CM | POA: Diagnosis not present

## 2016-09-17 DIAGNOSIS — N186 End stage renal disease: Secondary | ICD-10-CM | POA: Diagnosis not present

## 2016-09-20 DIAGNOSIS — N186 End stage renal disease: Secondary | ICD-10-CM | POA: Diagnosis not present

## 2016-09-20 DIAGNOSIS — Z992 Dependence on renal dialysis: Secondary | ICD-10-CM | POA: Diagnosis not present

## 2016-09-22 ENCOUNTER — Encounter (INDEPENDENT_AMBULATORY_CARE_PROVIDER_SITE_OTHER): Payer: Self-pay | Admitting: Vascular Surgery

## 2016-09-22 ENCOUNTER — Ambulatory Visit (INDEPENDENT_AMBULATORY_CARE_PROVIDER_SITE_OTHER): Payer: Medicare Other | Admitting: Vascular Surgery

## 2016-09-22 VITALS — BP 120/73 | HR 81 | Resp 16 | Wt 121.6 lb

## 2016-09-22 DIAGNOSIS — I10 Essential (primary) hypertension: Secondary | ICD-10-CM

## 2016-09-22 DIAGNOSIS — N186 End stage renal disease: Secondary | ICD-10-CM

## 2016-09-22 DIAGNOSIS — I726 Aneurysm of vertebral artery: Secondary | ICD-10-CM

## 2016-09-22 DIAGNOSIS — Z992 Dependence on renal dialysis: Secondary | ICD-10-CM | POA: Diagnosis not present

## 2016-09-22 NOTE — Discharge Instructions (Signed)
Paracentesis, Care After  Refer to this sheet in the next few weeks. These instructions provide you with information about caring for yourself after your procedure. Your health care provider may also give you more specific instructions. Your treatment has been planned according to current medical practices, but problems sometimes occur. Call your health care provider if you have any problems or questions after your procedure.  What can I expect after the procedure?  After your procedure, it is common to have a small amount of clear fluid coming from the puncture site.  Follow these instructions at home:  · Return to your normal activities as told by your health care provider. Ask your health care provider what activities are safe for you.  · Take over-the-counter and prescription medicines only as told by your health care provider.  · Do not take baths, swim, or use a hot tub until your health care provider approves.  · Follow instructions from your health care provider about:  ? How to take care of your puncture site.  ? When and how you should change your bandage (dressing).  ? When you should remove your dressing.  · Check your puncture area every day signs of infection. Watch for:  ? Redness, swelling, or pain.  ? Fluid, blood, or pus.  · Keep all follow-up visits as told by your health care provider. This is important.  Contact a health care provider if:  · You have redness, swelling, or pain at your puncture site.  · You start to have more clear fluid coming from your puncture site.  · You have blood or pus coming from your puncture site.  · You have chills.  · You have a fever.  Get help right away if:  · You develop chest pain or shortness of breath.  · You develop increasing pain, discomfort, or swelling in your abdomen.  · You feel dizzy or light-headed or you pass out.  This information is not intended to replace advice given to you by your health care provider. Make sure you discuss any questions you  have with your health care provider.  Document Released: 11/04/2014 Document Revised: 11/26/2015 Document Reviewed: 09/02/2014  Elsevier Interactive Patient Education © 2017 [employer]

## 2016-09-22 NOTE — Progress Notes (Signed)
MRN : [id number]  Melissa Bradshaw is a 66 y.o. (06-11-1951) female who presents with chief complaint of  Chief Complaint  Patient presents with  . Follow-up  .  History of Present Illness: The patient returns to the office for followup of their dialysis access. The function of the access has been stable.  Left brachial axillary AV graft was created 09/02/2016.  The patient denies hand pain or other symptoms consistent with steal phenomena.  No significant arm swelling.  The patient denies redness or swelling at the access site. The patient denies fever or chills at home or while on dialysis.  The patient denies amaurosis fugax or recent TIA symptoms. There are no recent neurological changes noted. The patient denies claudication symptoms or rest pain symptoms. The patient denies history of DVT, PE or superficial thrombophlebitis. The patient denies recent episodes of angina or shortness of breath.       Current Meds  Medication Sig  . amLODipine (NORVASC) 10 MG tablet Take 10 mg by mouth daily.  Marland Kitchen aspirin 81 MG tablet Take 81 mg by mouth daily.  . furosemide (LASIX) 20 MG tablet Take 20 mg by mouth daily.   Marland Kitchen guaiFENesin (MUCINEX) 600 MG 12 hr tablet Take 600 mg by mouth daily.  Marland Kitchen labetalol (NORMODYNE) 200 MG tablet Take 1 tablet (200 mg total) by mouth every morning. (Patient taking differently: Take 200 mg by mouth 2 (two) times daily. )  . latanoprost (XALATAN) 0.005 % ophthalmic solution Place 1 drop into both eyes at bedtime.  . sevelamer carbonate (RENVELA) 800 MG tablet    Current Facility-Administered Medications for the 09/22/16 encounter (Office Visit) with Renford Dills, MD  Medication  . ceFAZolin (ANCEF) IVPB 1 g/50 mL premix    Past Medical History:  Diagnosis Date  . Anemia   . Aneurysm (HCC)    Cerebral, [place], [place]  . Arthritis   . Cardiac pacemaker   . CHF (congestive heart failure) (HCC)   . Chronic kidney disease    ESRD  .  Constipation   . Coronary artery disease   . Dialysis patient (HCC)    Tues, Thurs, Sat  . Gastric ulcer   . Glaucoma (increased eye pressure)   . Headache   . Hypertension   . Presence of permanent cardiac pacemaker     Past Surgical History:  Procedure Laterality Date  . ANEURYSM COILING    . AV FISTULA PLACEMENT Left 07/20/2016   Procedure: ARTERIOVENOUS (AV) FISTULA CREATION;  Surgeon: Renford Dills, MD;  Location: [facility] ORS;  Service: Vascular;  Laterality: Left;  . AV FISTULA PLACEMENT Left 08/26/2016   Procedure: INSERTION OF ARTERIOVENOUS (AV) GORE-TEX GRAFT ARM;  Surgeon: Renford Dills, MD;  Location: [facility] ORS;  Service: Vascular;  Laterality: Left;  . COLONOSCOPY     2013  . INSERT / REPLACE / REMOVE PACEMAKER    . PACEMAKER PLACEMENT    . PERIPHERAL VASCULAR CATHETERIZATION N/A 06/02/2016   Procedure: Dialysis/Perma Catheter Insertion;  Surgeon: Annice Needy, MD;  Location: [facility] INVASIVE CV LAB;  Service: Cardiovascular;  Laterality: N/A;    Social History Social History  Substance Use Topics  . Smoking status: Former Smoker    Packs/day: 0.25    Years: 41.00    Types: Cigarettes  . Smokeless tobacco: Never Used  . Alcohol use No    Family History Family History  Problem Relation Age of Onset  . Hypertension Mother   .  Diabetes Father   . Cancer Sister   . Stroke Sister   . Hypertension Sister     Allergies  Allergen Reactions  . Hydralazine Hcl Other (See Comments)    Makes pt jittery, nervous, messes with vision   . Iodine Itching and Rash    Unknown reaction  . Ivp Dye [Iodinated Diagnostic Agents] Rash     REVIEW OF SYSTEMS (Negative unless checked)  Constitutional: [] Weight loss  [] Fever  [] Chills Cardiac: [] Chest pain   [] Chest pressure   [] Palpitations   [] Shortness of breath when laying flat   [] Shortness of breath with exertion. Vascular:  [] Pain in legs with walking   [] Pain in legs at rest  [] History of DVT   [] Phlebitis    [] Swelling in legs   [] Varicose veins   [] Non-healing ulcers Pulmonary:   [] Uses home oxygen   [] Productive cough   [] Hemoptysis   [] Wheeze  [] COPD   [] Asthma Neurologic:  [] Dizziness   [] Seizures   [] History of stroke   [] History of TIA  [] Aphasia   [] Vissual changes   [] Weakness or numbness in arm   [] Weakness or numbness in leg Musculoskeletal:   [] Joint swelling   [] Joint pain   [] Low back pain Hematologic:  [] Easy bruising  [] Easy bleeding   [] Hypercoagulable state   [] Anemic Gastrointestinal:  [] Diarrhea   [] Vomiting  [] Gastroesophageal reflux/heartburn   [] Difficulty swallowing. Genitourinary:  [x] Chronic kidney disease   [] Difficult urination  [] Frequent urination   [] Blood in urine Skin:  [] Rashes   [] Ulcers  Psychological:  [] History of anxiety   []  History of major depression.  Physical Examination  Vitals:   09/22/16 1601  BP: 120/73  Pulse: 81  Resp: 16  Weight: 121 lb 9.6 oz (55.2 kg)   Body mass index is 19.93 kg/m. Gen: WD/WN, NAD Head: /AT, No temporalis wasting.  Ear/Nose/Throat: Hearing grossly intact, nares w/o erythema or drainage, poor dentition Eyes: PER, EOMI, sclera nonicteric.  Neck: Supple, no masses.  No bruit or JVD.  Pulmonary:  Good air movement, clear to auscultation bilaterally, no use of accessory muscles.  Cardiac: RRR, normal S1, S2, no Murmurs. Vascular: Left brachial axillar av graft good thrill good bruit Vessel Right Left  Radial Palpable Palpable  Ulnar Palpable Palpable  Brachial Palpable Palpable  Gastrointestinal: soft, non-distended. No guarding/no peritoneal signs.  Musculoskeletal: M/S 5/5 throughout.  No deformity or atrophy.  Neurologic: CN 2-12 intact. Pain and light touch intact in extremities.  Symmetrical.  Speech is fluent. Motor exam as listed above. Psychiatric: Judgment intact, Mood & affect appropriate for pt's clinical situation. Dermatologic: No rashes or ulcers noted.  No changes consistent with cellulitis. Lymph :  No Cervical lymphadenopathy, no lichenification or skin changes of chronic lymphedema.  CBC Lab Results  Component Value Date   WBC 5.9 08/19/2016   HGB 10.9 (L) 08/26/2016   HCT 32.0 (L) 08/26/2016   MCV 77.9 (L) 08/19/2016   PLT 355 08/19/2016    BMET    Component Value Date/Time   NA 139 08/26/2016 1005   NA 141 01/30/2014 1505   K 3.7 08/26/2016 1005   K 5.0 01/30/2014 1505   CL 100 (L) 08/19/2016 0957   CL 116 (H) 01/30/2014 1505   CO2 29 08/19/2016 0957   CO2 17 (L) 01/30/2014 1505   GLUCOSE 97 08/26/2016 1005   GLUCOSE 93 01/30/2014 1505   BUN 46 (H) 07/13/2016 1102   BUN 50 (H) 06/19/2014 1413   CREATININE 4.00 (H) 07/13/2016 1102  CREATININE 3.33 (H) 06/19/2014 1413   CALCIUM 8.4 (L) 07/13/2016 1102   CALCIUM 8.8 01/30/2014 1505   GFRNONAA 11 (L) 07/13/2016 1102   GFRNONAA 15 (L) 06/19/2014 1413   GFRNONAA 14 (L) 01/30/2014 1505   GFRAA 12 (L) 07/13/2016 1102   GFRAA 18 (L) 06/19/2014 1413   GFRAA 16 (L) 01/30/2014 1505   CrCl cannot be calculated (Patient's most recent lab result is older than the maximum 21 days allowed.).  COAG Lab Results  Component Value Date   INR 1.11 07/13/2016    [facility] [place] Paracentesis  Result Date: 08/31/2016 INDICATION: 66 year old female with autism autosomal dominant polycystic kidney disease and recurrent symptomatic ascites EXAM: ULTRASOUND GUIDED  PARACENTESIS MEDICATIONS: None. COMPLICATIONS: None immediate. PROCEDURE: Informed written consent was obtained from the patient after a discussion of the risks, benefits and alternatives to treatment. A timeout was performed prior to the initiation of the procedure. Initial ultrasound scanning demonstrates a large amount of ascites within the right lower abdominal quadrant. The right lower abdomen was prepped and draped in the usual sterile fashion. 1% lidocaine with epinephrine was used for local anesthesia. Following this, a 6 Fr Safe-T-Centesis catheter was introduced. An  ultrasound image was saved for documentation purposes. The paracentesis was performed. The catheter was removed and a dressing was applied. The patient tolerated the procedure well without immediate post procedural complication. FINDINGS: A total of approximately 4 L of straw-colored ascitic fluid was removed. IMPRESSION: Successful ultrasound-guided paracentesis yielding 4 liters of peritoneal fluid. Electronically Signed   By: Malachy MoanHeath  McCullough M.D.   On: 08/31/2016 15:57    Assessment/Plan 1. End stage renal disease (HCC) Recommend:  The patient is doing well and currently has adequate dialysis access. The patient's dialysis center is not reporting any access issues.  OK to begin cannulating the left arm graft   The patient should have a duplex ultrasound of the dialysis access in 3 months. The patient will follow-up with me in the office after each ultrasound   - VAS US DUPLEX DIALYSIS ACCESS (AVF,AVG); Future  2. Essential (primary) hypertension Continue antihypertensive medications as already ordered, these medications have been reviewed and there are no changes at this time.  3. Aneurysm of vertebral artery (HCC) Followed at another facility    Levora DredgeGregory Yousaf Sainato, MD  09/22/2016 4:33 PM

## 2016-09-24 DIAGNOSIS — N186 End stage renal disease: Secondary | ICD-10-CM | POA: Diagnosis not present

## 2016-09-24 DIAGNOSIS — Z992 Dependence on renal dialysis: Secondary | ICD-10-CM | POA: Diagnosis not present

## 2016-09-26 ENCOUNTER — Ambulatory Visit
Admission: RE | Admit: 2016-09-26 | Discharge: 2016-09-26 | Disposition: A | Discharge status: Home / Self Care | Payer: Medicare Other | Source: Ambulatory Visit | Attending: Nephrology | Admitting: Nephrology

## 2016-09-26 DIAGNOSIS — N186 End stage renal disease: Secondary | ICD-10-CM | POA: Diagnosis not present

## 2016-09-26 DIAGNOSIS — Z992 Dependence on renal dialysis: Secondary | ICD-10-CM | POA: Diagnosis not present

## 2016-09-27 ENCOUNTER — Ambulatory Visit
Admission: RE | Admit: 2016-09-27 | Discharge: 2016-09-27 | Disposition: A | Discharge status: Home / Self Care | Payer: Medicare Other | Source: Ambulatory Visit | Attending: Internal Medicine | Admitting: Internal Medicine

## 2016-09-27 DIAGNOSIS — R188 Other ascites: Secondary | ICD-10-CM | POA: Diagnosis not present

## 2016-09-27 NOTE — Discharge Instructions (Signed)
Paracentesis, Care After  Refer to this sheet in the next few weeks. These instructions provide you with information about caring for yourself after your procedure. Your health care provider may also give you more specific instructions. Your treatment has been planned according to current medical practices, but problems sometimes occur. Call your health care provider if you have any problems or questions after your procedure.  What can I expect after the procedure?  After your procedure, it is common to have a small amount of clear fluid coming from the puncture site.  Follow these instructions at home:  · Return to your normal activities as told by your health care provider. Ask your health care provider what activities are safe for you.  · Take over-the-counter and prescription medicines only as told by your health care provider.  · Do not take baths, swim, or use a hot tub until your health care provider approves.  · Follow instructions from your health care provider about:  ? How to take care of your puncture site.  ? When and how you should change your bandage (dressing).  ? When you should remove your dressing.  · Check your puncture area every day signs of infection. Watch for:  ? Redness, swelling, or pain.  ? Fluid, blood, or pus.  · Keep all follow-up visits as told by your health care provider. This is important.  Contact a health care provider if:  · You have redness, swelling, or pain at your puncture site.  · You start to have more clear fluid coming from your puncture site.  · You have blood or pus coming from your puncture site.  · You have chills.  · You have a fever.  Get help right away if:  · You develop chest pain or shortness of breath.  · You develop increasing pain, discomfort, or swelling in your abdomen.  · You feel dizzy or light-headed or you pass out.  This information is not intended to replace advice given to you by your health care provider. Make sure you discuss any questions you  have with your health care provider.  Document Released: 11/04/2014 Document Revised: 11/26/2015 Document Reviewed: 09/02/2014  Elsevier Interactive Patient Education © 2017 [employer]

## 2016-09-28 DIAGNOSIS — N186 End stage renal disease: Secondary | ICD-10-CM | POA: Diagnosis not present

## 2016-09-28 DIAGNOSIS — Z992 Dependence on renal dialysis: Secondary | ICD-10-CM | POA: Diagnosis not present

## 2016-09-29 DIAGNOSIS — Z992 Dependence on renal dialysis: Secondary | ICD-10-CM | POA: Diagnosis not present

## 2016-09-29 DIAGNOSIS — N186 End stage renal disease: Secondary | ICD-10-CM | POA: Diagnosis not present

## 2016-09-30 DIAGNOSIS — Z992 Dependence on renal dialysis: Secondary | ICD-10-CM | POA: Diagnosis not present

## 2016-09-30 DIAGNOSIS — Z87891 Personal history of nicotine dependence: Secondary | ICD-10-CM | POA: Diagnosis not present

## 2016-09-30 DIAGNOSIS — I10 Essential (primary) hypertension: Secondary | ICD-10-CM | POA: Diagnosis not present

## 2016-09-30 DIAGNOSIS — Z01818 Encounter for other preprocedural examination: Secondary | ICD-10-CM | POA: Insufficient documentation

## 2016-09-30 DIAGNOSIS — Q446 Cystic disease of liver: Secondary | ICD-10-CM | POA: Insufficient documentation

## 2016-09-30 DIAGNOSIS — I440 Atrioventricular block, first degree: Secondary | ICD-10-CM | POA: Diagnosis not present

## 2016-09-30 DIAGNOSIS — Z950 Presence of cardiac pacemaker: Secondary | ICD-10-CM | POA: Diagnosis not present

## 2016-09-30 DIAGNOSIS — Z7682 Awaiting organ transplant status: Secondary | ICD-10-CM | POA: Diagnosis not present

## 2016-09-30 DIAGNOSIS — Z1159 Encounter for screening for other viral diseases: Secondary | ICD-10-CM | POA: Diagnosis not present

## 2016-09-30 DIAGNOSIS — Z7289 Other problems related to lifestyle: Secondary | ICD-10-CM | POA: Diagnosis not present

## 2016-09-30 DIAGNOSIS — R918 Other nonspecific abnormal finding of lung field: Secondary | ICD-10-CM | POA: Diagnosis not present

## 2016-09-30 DIAGNOSIS — N186 End stage renal disease: Secondary | ICD-10-CM | POA: Diagnosis not present

## 2016-10-01 DIAGNOSIS — Z992 Dependence on renal dialysis: Secondary | ICD-10-CM | POA: Diagnosis not present

## 2016-10-01 DIAGNOSIS — N186 End stage renal disease: Secondary | ICD-10-CM | POA: Diagnosis not present

## 2016-10-03 DIAGNOSIS — N186 End stage renal disease: Secondary | ICD-10-CM | POA: Diagnosis not present

## 2016-10-03 DIAGNOSIS — Z992 Dependence on renal dialysis: Secondary | ICD-10-CM | POA: Diagnosis not present

## 2016-10-04 ENCOUNTER — Other Ambulatory Visit (INDEPENDENT_AMBULATORY_CARE_PROVIDER_SITE_OTHER): Payer: Self-pay | Admitting: Vascular Surgery

## 2016-10-04 ENCOUNTER — Encounter (INDEPENDENT_AMBULATORY_CARE_PROVIDER_SITE_OTHER): Payer: Self-pay

## 2016-10-05 DIAGNOSIS — N186 End stage renal disease: Secondary | ICD-10-CM | POA: Diagnosis not present

## 2016-10-05 DIAGNOSIS — Z992 Dependence on renal dialysis: Secondary | ICD-10-CM | POA: Diagnosis not present

## 2016-10-07 DIAGNOSIS — Z992 Dependence on renal dialysis: Secondary | ICD-10-CM | POA: Diagnosis not present

## 2016-10-07 DIAGNOSIS — N186 End stage renal disease: Secondary | ICD-10-CM | POA: Diagnosis not present

## 2016-10-10 ENCOUNTER — Inpatient Hospital Stay: Admission: RE | Admit: 2016-10-10 | Payer: Self-pay | Source: Ambulatory Visit

## 2016-10-10 ENCOUNTER — Ambulatory Visit
Admission: RE | Admit: 2016-10-10 | Discharge: 2016-10-10 | Disposition: A | Discharge status: Home / Self Care | Payer: Medicare Other | Source: Ambulatory Visit | Attending: Nephrology | Admitting: Nephrology

## 2016-10-10 DIAGNOSIS — E119 Type 2 diabetes mellitus without complications: Secondary | ICD-10-CM | POA: Diagnosis not present

## 2016-10-10 DIAGNOSIS — Z992 Dependence on renal dialysis: Secondary | ICD-10-CM | POA: Diagnosis not present

## 2016-10-10 DIAGNOSIS — R188 Other ascites: Secondary | ICD-10-CM | POA: Diagnosis not present

## 2016-10-10 DIAGNOSIS — N186 End stage renal disease: Secondary | ICD-10-CM | POA: Diagnosis not present

## 2016-10-11 ENCOUNTER — Ambulatory Visit
Admission: RE | Admit: 2016-10-11 | Discharge: 2016-10-11 | Disposition: A | Discharge status: Home / Self Care | Payer: Medicare Other | Source: Ambulatory Visit | Attending: Vascular Surgery | Admitting: Vascular Surgery

## 2016-10-11 ENCOUNTER — Encounter: Payer: Self-pay | Admitting: Vascular Surgery

## 2016-10-11 ENCOUNTER — Encounter
Admission: RE | Disposition: A | Discharge status: Home / Self Care | Payer: Self-pay | Source: Ambulatory Visit | Attending: Vascular Surgery

## 2016-10-11 ENCOUNTER — Ambulatory Visit: Payer: Medicare Other

## 2016-10-11 ENCOUNTER — Ambulatory Visit: Admission: RE | Admit: 2016-10-11 | Payer: Medicare Other | Source: Ambulatory Visit

## 2016-10-11 DIAGNOSIS — Z823 Family history of stroke: Secondary | ICD-10-CM | POA: Diagnosis not present

## 2016-10-11 DIAGNOSIS — Z8719 Personal history of other diseases of the digestive system: Secondary | ICD-10-CM | POA: Diagnosis not present

## 2016-10-11 DIAGNOSIS — Z9889 Other specified postprocedural states: Secondary | ICD-10-CM | POA: Diagnosis not present

## 2016-10-11 DIAGNOSIS — Z91041 Radiographic dye allergy status: Secondary | ICD-10-CM | POA: Diagnosis not present

## 2016-10-11 DIAGNOSIS — I132 Hypertensive heart and chronic kidney disease with heart failure and with stage 5 chronic kidney disease, or end stage renal disease: Secondary | ICD-10-CM | POA: Insufficient documentation

## 2016-10-11 DIAGNOSIS — Z8249 Family history of ischemic heart disease and other diseases of the circulatory system: Secondary | ICD-10-CM | POA: Diagnosis not present

## 2016-10-11 DIAGNOSIS — N186 End stage renal disease: Secondary | ICD-10-CM

## 2016-10-11 DIAGNOSIS — H409 Unspecified glaucoma: Secondary | ICD-10-CM | POA: Diagnosis not present

## 2016-10-11 DIAGNOSIS — Z833 Family history of diabetes mellitus: Secondary | ICD-10-CM | POA: Diagnosis not present

## 2016-10-11 DIAGNOSIS — Z87891 Personal history of nicotine dependence: Secondary | ICD-10-CM | POA: Diagnosis not present

## 2016-10-11 DIAGNOSIS — I509 Heart failure, unspecified: Secondary | ICD-10-CM | POA: Diagnosis not present

## 2016-10-11 DIAGNOSIS — Z950 Presence of cardiac pacemaker: Secondary | ICD-10-CM | POA: Diagnosis not present

## 2016-10-11 DIAGNOSIS — Z8679 Personal history of other diseases of the circulatory system: Secondary | ICD-10-CM | POA: Diagnosis not present

## 2016-10-11 DIAGNOSIS — Z452 Encounter for adjustment and management of vascular access device: Secondary | ICD-10-CM | POA: Diagnosis not present

## 2016-10-11 DIAGNOSIS — Z7982 Long term (current) use of aspirin: Secondary | ICD-10-CM | POA: Diagnosis not present

## 2016-10-11 DIAGNOSIS — M1990 Unspecified osteoarthritis, unspecified site: Secondary | ICD-10-CM | POA: Diagnosis not present

## 2016-10-11 DIAGNOSIS — D631 Anemia in chronic kidney disease: Secondary | ICD-10-CM | POA: Diagnosis not present

## 2016-10-11 DIAGNOSIS — Z992 Dependence on renal dialysis: Secondary | ICD-10-CM | POA: Insufficient documentation

## 2016-10-11 DIAGNOSIS — Z809 Family history of malignant neoplasm, unspecified: Secondary | ICD-10-CM | POA: Insufficient documentation

## 2016-10-11 DIAGNOSIS — Z888 Allergy status to other drugs, medicaments and biological substances status: Secondary | ICD-10-CM | POA: Diagnosis not present

## 2016-10-11 DIAGNOSIS — R51 Headache: Secondary | ICD-10-CM | POA: Diagnosis not present

## 2016-10-11 DIAGNOSIS — I2510 Atherosclerotic heart disease of native coronary artery without angina pectoris: Secondary | ICD-10-CM | POA: Diagnosis not present

## 2016-10-11 HISTORY — PX: DIALYSIS/PERMA CATHETER REMOVAL: CATH118289

## 2016-10-11 SURGERY — DIALYSIS/PERMA CATHETER REMOVAL
Anesthesia: Moderate Sedation

## 2016-10-11 MED ORDER — LIDOCAINE-EPINEPHRINE (PF) 1 %-1:200000 IJ SOLN
INTRAMUSCULAR | Status: DC | PRN
  Administered 2016-10-11: 20 mL via INTRADERMAL

## 2016-10-11 MED ORDER — LIDOCAINE-EPINEPHRINE (PF) 2 %-1:200000 IJ SOLN
INTRAMUSCULAR | Status: AC
  Filled 2016-10-11: qty 20

## 2016-10-11 SURGICAL SUPPLY — 4 items
FCP FG STRG 5.5XNS LF DISP (INSTRUMENTS) ×1
FORCEPS FG STRG 5.5XNS LF DISP (INSTRUMENTS) ×1 IMPLANT
FORCEPS KELLY 5.5 STR (INSTRUMENTS) ×2
TRAY LACERAT/PLASTIC (MISCELLANEOUS) ×3 IMPLANT

## 2016-10-11 NOTE — Op Note (Signed)
  OPERATIVE NOTE   PROCEDURE: 1. Removal of a right IJ tunneled dialysis catheter  PRE-OPERATIVE DIAGNOSIS: Complication of dialysis catheter, End stage renal disease  POST-OPERATIVE DIAGNOSIS: Same  SURGEON: Levora Dredge, M.D.  ANESTHESIA: Local anesthetic with 1% lidocaine with epinephrine   ESTIMATED BLOOD LOSS: Minimal   FINDING(S): 1. Catheter intact   SPECIMEN(S):  Catheter  INDICATIONS:   Melissa Bradshaw is a 66 y.o. female who presents with nonfunctioning right IJ tunneled catheter associated with a left arm AV graft that is working well.  The patient has undergone placement of an extremity access which is working and this has been successfully cannulated without difficulty.  therefore is undergoing removal of his tunneled catheter which is no longer needed to avoid septic complications.   DESCRIPTION: After obtaining full informed written consent, the patient was positioned supine. The right IJ tunneled catheter and surrounding area is prepped and draped in a sterile fashion. The cuff was localized by palpation and noted to be less than 3 cm from the exit site. After appropriate timeout is called, 1% lidocaine with epinephrine is infiltrated into the surrounding tissues around the cuff. Small transverse incision is created at the exit site with an 11 blade scalpel and the dissection was carried up along the catheter to expose the cuff of the tunneled catheter.  The catheter cuff is then freed from the surrounding attachments and adhesions. Once the catheter has been freed circumferentially it is removed in 1 piece. Light pressure was held at the base of the neck.   Antibiotic ointment and a sterile dressing is applied to the exit site. Patient tolerated procedure well and there were no complications.  COMPLICATIONS: None  CONDITION: Unchanged  Levora Dredge, M.D. [facility] Office: [phone]  10/11/2016,1:00 PM

## 2016-10-11 NOTE — Discharge Instructions (Signed)
Tunneled Catheter removal, Care After °Refer to this sheet in the next few weeks. These instructions provide you with information about caring for yourself after your procedure. Your health care provider may also give you more specific instructions. Your treatment has been planned according to current medical practices, but problems sometimes occur. Call your health care provider if you have any problems or questions after your procedure. °What can I expect after the procedure? °After the procedure, it is common to have: °· Some mild redness, swelling, and pain around your catheter site. °· A small amount of blood or clear fluid coming from your incisions. °Follow these instructions at home: °Incision care  °· Check your incision areas every day for signs of infection. Check for: °¨ More redness, swelling, or pain. °¨ More fluid or blood. °¨ Warmth. °¨ Pus or a bad smell. °· Follow instructions from your health care provider about how to take care of your incisions. Make sure you: °¨ Wash your hands with soap and water before you change your bandages (dressings). If soap and water are not available, use hand sanitizer. °¨ Change your dressings as told by your health care provider. Wash the area around your incisions with a germ-killing (antiseptic) solution when you change your dressing, as told by your health care provider. °¨ Leave stitches (sutures), skin glue, or adhesive strips in place. These skin closures may need to stay in place for 2 weeks or longer. If adhesive strip edges start to loosen and curl up, you may trim the loose edges. Do not remove adhesive strips completely unless your health care provider tells you to do that. °·  °Contact a health care provider if: °· You have more fluid or blood coming from your incisions. °· You have more redness, swelling, or pain at your incisions or around the area where your catheter is removed °· Your incisions feel warm to the touch. °· You feel unusually  weak. °· You feel nauseous. °·  °·  °·  °Get help right away if: °· You have swelling in your arm, shoulder, neck, or face. °· You develop chest pain. °· You have difficulty breathing. °· You feel dizzy or light-headed. °· You have pus or a bad smell coming from your incisions. °· You have a fever. °· You develop bleeding from your catheter removal site, and your bleeding does not stop. °This information is not intended to replace advice given to you by your health care provider. Make sure you discuss any questions you have with your health care provider. °Document Released: 06/06/2012 Document Revised: 02/21/2016 Document Reviewed: 03/16/2015 °Elsevier Interactive Patient Education © 2017 [employer] ° °

## 2016-10-11 NOTE — H&P (Signed)
[facility] Admission History & Physical  MRN : [id number]  Melissa Bradshaw is a 66 y.o. (1950-11-20) female who presents with chief complaint of dialysis catheter not working.  History of Present Illness: I am asked to evaluate the patient by the dialysis center. The patient was sent here because they have a nonfunctioning tunneled catheter and a functioning left arm AV access.  The patient reports they're not been any problems with any of their dialysis runs. They are reporting good flows with good parameters at dialysis.  Patient denies pain or tenderness overlying the access.  There is no pain with dialysis.  The patient denies hand pain or finger pain consistent with steal syndrome.  No fevers or chills while on dialysis.   Current Facility-Administered Medications  Medication Dose Route Frequency Provider Last Rate Last Dose  . ceFAZolin (ANCEF) IVPB 1 g/50 mL premix  1 g Intravenous Once Renford Dills, MD       Current Outpatient Prescriptions  Medication Sig Dispense Refill  . amLODipine (NORVASC) 10 MG tablet Take 10 mg by mouth daily.    Marland Kitchen aspirin 81 MG tablet Take 81 mg by mouth daily.    . calcium acetate (PHOSLO) 667 MG capsule Take 1,334 mg by mouth 3 (three) times daily with meals.    . furosemide (LASIX) 20 MG tablet Take 20 mg by mouth daily.     Marland Kitchen labetalol (NORMODYNE) 200 MG tablet Take 1 tablet (200 mg total) by mouth every morning. (Patient taking differently: Take 200 mg by mouth 2 (two) times daily. ) 30 tablet 0  . latanoprost (XALATAN) 0.005 % ophthalmic solution Place 1 drop into both eyes at bedtime.      Past Medical History:  Diagnosis Date  . Anemia   . Aneurysm (HCC)    Cerebral, [facility] [place], [place]  . Arthritis   . Cardiac pacemaker   . CHF (congestive heart failure) (HCC)   . Chronic kidney disease    ESRD  . Constipation   . Coronary artery disease   . Dialysis patient (HCC)    Tues, Thurs, Sat  . Gastric  ulcer   . Glaucoma (increased eye pressure)   . Headache   . Hypertension   . Presence of permanent cardiac pacemaker     Past Surgical History:  Procedure Laterality Date  . ANEURYSM COILING    . AV FISTULA PLACEMENT Left 07/20/2016   Procedure: ARTERIOVENOUS (AV) FISTULA CREATION;  Surgeon: Renford Dills, MD;  Location: [facility] ORS;  Service: Vascular;  Laterality: Left;  . AV FISTULA PLACEMENT Left 08/26/2016   Procedure: INSERTION OF ARTERIOVENOUS (AV) GORE-TEX GRAFT ARM;  Surgeon: Renford Dills, MD;  Location: [facility] ORS;  Service: Vascular;  Laterality: Left;  . COLONOSCOPY     2013  . INSERT / REPLACE / REMOVE PACEMAKER    . PACEMAKER PLACEMENT    . PERIPHERAL VASCULAR CATHETERIZATION N/A 06/02/2016   Procedure: Dialysis/Perma Catheter Insertion;  Surgeon: Annice Needy, MD;  Location: [facility] INVASIVE CV LAB;  Service: Cardiovascular;  Laterality: N/A;    Social History Social History  Substance Use Topics  . Smoking status: Former Smoker    Packs/day: 0.25    Years: 41.00    Types: Cigarettes  . Smokeless tobacco: Never Used  . Alcohol use No    Family History Family History  Problem Relation Age of Onset  . Hypertension Mother   . Diabetes Father   . Cancer Sister   .  Stroke Sister   . Hypertension Sister     No family history of bleeding or clotting disorders, autoimmune disease or porphyria  Allergies  Allergen Reactions  . Hydralazine Hcl Other (See Comments)    Makes pt jittery, nervous, messes with vision   . Iodine Itching and Rash    Unknown reaction  . Ivp Dye [Iodinated Diagnostic Agents] Rash     REVIEW OF SYSTEMS (Negative unless checked)  Constitutional: Weight loss  Fever  Chills Cardiac: Chest pain   Chest pressure   Palpitations   Shortness of breath when laying flat   Shortness of breath at rest   Shortness of breath with exertion. Vascular:  Pain in legs with walking   Pain in legs at rest   Pain in legs when  laying flat   Claudication   Pain in feet when walking  Pain in feet at rest  Pain in feet when laying flat   History of DVT   Phlebitis   Swelling in legs   Varicose veins   Non-healing ulcers Pulmonary:   Uses home oxygen   Productive cough   Hemoptysis   Wheeze  COPD   Asthma Neurologic:  Dizziness  Blackouts   Seizures   History of stroke   History of TIA  Aphasia   Temporary blindness   Dysphagia   Weakness or numbness in arms   Weakness or numbness in legs Musculoskeletal:  Arthritis   Joint swelling   Joint pain   Low back pain Hematologic:  Easy bruising  Easy bleeding   Hypercoagulable state   Anemic  Hepatitis Gastrointestinal:  Blood in stool   Vomiting blood  Gastroesophageal reflux/heartburn   Difficulty swallowing. Genitourinary:  Chronic kidney disease   Difficult urination  Frequent urination  Burning with urination   Blood in urine Skin:  Rashes   Ulcers   Wounds Psychological:  History of anxiety    History of major depression.  Physical Examination  Vitals:   10/11/16 1146  BP: 115/63  Pulse: 75  Resp: 17  Temp: 98.4 F (36.9 C)  SpO2: 94%   There is no height or weight on file to calculate BMI. Gen: WD/WN, NAD Head: [place]/AT, No temporalis wasting. Prominent temp pulse not noted. Ear/Nose/Throat: Hearing grossly intact, nares w/o erythema or drainage, oropharynx w/o Erythema/Exudate,  Eyes: Conjunctiva clear, sclera non-icteric Neck: Trachea midline.  No JVD.  Pulmonary:  Good air movement, respirations not labored, no use of accessory muscles.  Cardiac: RRR, normal S1, S2. Vascular: Left brachial axillary AV graft with thrill good bruit. Right IJ tunneled catheter nontender no erythema and no drainage Vessel Right Left  Radial Palpable Palpable  Ulnar Not Palpable Not Palpable  Brachial Palpable Palpable  Carotid Palpable, without bruit Palpable, without bruit   Gastrointestinal: soft, non-tender/non-distended. No guarding/reflex.  Musculoskeletal: M/S 5/5 throughout.  Extremities without ischemic changes.  No deformity or atrophy.  Neurologic: Sensation grossly intact in extremities.  Symmetrical.  Speech is fluent. Motor exam as listed above. Psychiatric: Judgment intact, Mood & affect appropriate for pt's clinical situation. Dermatologic: No rashes or ulcers noted.  No cellulitis or open wounds. Lymph : No Cervical, Axillary, or Inguinal lymphadenopathy.   CBC Lab Results  Component Value Date   WBC 5.9 08/19/2016   HGB 10.9 (L) 08/26/2016   HCT 32.0 (L) 08/26/2016   MCV 77.9 (L) 08/19/2016   PLT 355 08/19/2016    BMET    Component Value Date/Time   NA 139 08/26/2016 1005  NA 141 01/30/2014 1505   K 3.7 08/26/2016 1005   K 5.0 01/30/2014 1505   CL 100 (L) 08/19/2016 0957   CL 116 (H) 01/30/2014 1505   CO2 29 08/19/2016 0957   CO2 17 (L) 01/30/2014 1505   GLUCOSE 97 08/26/2016 1005   GLUCOSE 93 01/30/2014 1505   BUN 46 (H) 07/13/2016 1102   BUN 50 (H) 06/19/2014 1413   CREATININE 4.00 (H) 07/13/2016 1102   CREATININE 3.33 (H) 06/19/2014 1413   CALCIUM 8.4 (L) 07/13/2016 1102   CALCIUM 8.8 01/30/2014 1505   GFRNONAA 11 (L) 07/13/2016 1102   GFRNONAA 15 (L) 06/19/2014 1413   GFRNONAA 14 (L) 01/30/2014 1505   GFRAA 12 (L) 07/13/2016 1102   GFRAA 18 (L) 06/19/2014 1413   GFRAA 16 (L) 01/30/2014 1505   CrCl cannot be calculated (Patient's most recent lab result is older than the maximum 21 days allowed.).  COAG Lab Results  Component Value Date   INR 1.11 07/13/2016    Radiology US Paracentesis  Result Date: 10/10/2016 INDICATION: End-stage renal disease and ascites. EXAM: ULTRASOUND GUIDED PARACENTESIS MEDICATIONS: None. COMPLICATIONS: None immediate. PROCEDURE: Informed written consent was obtained from the patient after a discussion of the risks, benefits and alternatives to treatment. A timeout was performed prior to  the initiation of the procedure. Initial ultrasound was performed to localize ascites. The right lower abdomen was prepped and draped in the usual sterile fashion. 1% lidocaine was used for local anesthesia. Following this, a 6 Fr Safe-T-Centesis catheter was introduced. An ultrasound image was saved for documentation purposes. The paracentesis was performed. The catheter was removed and a dressing was applied. The patient tolerated the procedure well without immediate post procedural complication. FINDINGS: A total of approximately 4.6 L of yellowish fluid was removed. IMPRESSION: Successful ultrasound-guided paracentesis yielding 4.6 liters of peritoneal fluid. Electronically Signed   By: Irish Lack M.D.   On: 10/10/2016 17:05   US Paracentesis  Result Date: 09/27/2016 INDICATION: Ascites. EXAM: ULTRASOUND GUIDED therapeutic PARACENTESIS MEDICATIONS: None. COMPLICATIONS: None immediate. PROCEDURE: Informed written consent was obtained from the patient after a discussion of the risks, benefits and alternatives to treatment. A timeout was performed prior to the initiation of the procedure. Initial ultrasound scanning demonstrates a large amount of ascites within the right lower abdominal quadrant. The right lower abdomen was prepped and draped in the usual sterile fashion. 1% lidocaine with epinephrine was used for local anesthesia. Following this, a 8 Fr Safe-T-Centesis catheter was introduced. An ultrasound image was saved for documentation purposes. The paracentesis was performed. The catheter was removed and a dressing was applied. The patient tolerated the procedure well without immediate post procedural complication. FINDINGS: A total of approximately 4.5 L of serous fluid was removed. IMPRESSION: Successful ultrasound-guided paracentesis yielding 4.5 liters of peritoneal fluid. Electronically Signed   By: Lupita Raider, M.D.   On: 09/27/2016 15:37    Assessment/Plan 1.  Complication dialysis  device with thrombosis AV access:  Patient's tunneled catheter is no longer working. She has a functioning left arm AV access which has been working well. She therefore no longer needs the tunneled catheter and this catheter represents potential portal of infection. The patient will undergo removal of the tunneled catheter under local anesthesia.  The risks and benefits were described to the patient.  All questions were answered.  The patient agrees to proceed with angiography and intervention. Potassium will be drawn to ensure that it is an appropriate level prior to performing  intervention. 2.  End-stage renal disease requiring hemodialysis:  Patient will continue dialysis therapy without further interruption if a successful intervention is not achieved then a tunneled catheter will be placed. Dialysis has already been arranged. 3.  Hypertension:  Patient will continue medical management; nephrology is following no changes in oral medications. 4.  Coronary artery disease:  EKG will be monitored. Nitrates will be used if needed. The patient's oral cardiac medications will be continued.    Levora Dredge, MD  10/11/2016 12:57 PM

## 2016-10-12 DIAGNOSIS — Z992 Dependence on renal dialysis: Secondary | ICD-10-CM | POA: Diagnosis not present

## 2016-10-12 DIAGNOSIS — N186 End stage renal disease: Secondary | ICD-10-CM | POA: Diagnosis not present

## 2016-10-14 DIAGNOSIS — Z992 Dependence on renal dialysis: Secondary | ICD-10-CM | POA: Diagnosis not present

## 2016-10-14 DIAGNOSIS — N186 End stage renal disease: Secondary | ICD-10-CM | POA: Diagnosis not present

## 2016-10-17 ENCOUNTER — Other Ambulatory Visit: Payer: Self-pay

## 2016-10-19 DIAGNOSIS — N186 End stage renal disease: Secondary | ICD-10-CM | POA: Diagnosis not present

## 2016-10-19 DIAGNOSIS — Z992 Dependence on renal dialysis: Secondary | ICD-10-CM | POA: Diagnosis not present

## 2016-10-20 ENCOUNTER — Other Ambulatory Visit: Payer: Self-pay | Admitting: Nephrology

## 2016-10-20 DIAGNOSIS — R188 Other ascites: Secondary | ICD-10-CM

## 2016-10-21 DIAGNOSIS — Z992 Dependence on renal dialysis: Secondary | ICD-10-CM | POA: Diagnosis not present

## 2016-10-21 DIAGNOSIS — N186 End stage renal disease: Secondary | ICD-10-CM | POA: Diagnosis not present

## 2016-10-24 ENCOUNTER — Ambulatory Visit: Payer: Medicare Other

## 2016-10-24 DIAGNOSIS — N186 End stage renal disease: Secondary | ICD-10-CM | POA: Diagnosis not present

## 2016-10-24 DIAGNOSIS — Z992 Dependence on renal dialysis: Secondary | ICD-10-CM | POA: Diagnosis not present

## 2016-10-25 ENCOUNTER — Ambulatory Visit
Admission: RE | Admit: 2016-10-25 | Discharge: 2016-10-25 | Disposition: A | Discharge status: Home / Self Care | Payer: Medicare Other | Source: Ambulatory Visit | Attending: Nephrology | Admitting: Nephrology

## 2016-10-25 ENCOUNTER — Other Ambulatory Visit: Payer: Medicare Other

## 2016-10-25 DIAGNOSIS — R188 Other ascites: Secondary | ICD-10-CM | POA: Diagnosis not present

## 2016-10-26 DIAGNOSIS — Z992 Dependence on renal dialysis: Secondary | ICD-10-CM | POA: Diagnosis not present

## 2016-10-26 DIAGNOSIS — N186 End stage renal disease: Secondary | ICD-10-CM | POA: Diagnosis not present

## 2016-10-28 DIAGNOSIS — Z992 Dependence on renal dialysis: Secondary | ICD-10-CM | POA: Diagnosis not present

## 2016-10-28 DIAGNOSIS — N186 End stage renal disease: Secondary | ICD-10-CM | POA: Diagnosis not present

## 2016-10-31 ENCOUNTER — Other Ambulatory Visit: Payer: Self-pay

## 2016-10-31 DIAGNOSIS — N186 End stage renal disease: Secondary | ICD-10-CM | POA: Diagnosis not present

## 2016-10-31 DIAGNOSIS — Z992 Dependence on renal dialysis: Secondary | ICD-10-CM | POA: Diagnosis not present

## 2016-11-03 DIAGNOSIS — R162 Hepatomegaly with splenomegaly, not elsewhere classified: Secondary | ICD-10-CM | POA: Diagnosis not present

## 2016-11-03 DIAGNOSIS — N183 Chronic kidney disease, stage 3 (moderate): Secondary | ICD-10-CM | POA: Diagnosis not present

## 2016-11-03 DIAGNOSIS — Z950 Presence of cardiac pacemaker: Secondary | ICD-10-CM | POA: Diagnosis not present

## 2016-11-03 DIAGNOSIS — Q446 Cystic disease of liver: Secondary | ICD-10-CM | POA: Diagnosis not present

## 2016-11-03 DIAGNOSIS — K739 Chronic hepatitis, unspecified: Secondary | ICD-10-CM | POA: Diagnosis not present

## 2016-11-04 DIAGNOSIS — N186 End stage renal disease: Secondary | ICD-10-CM | POA: Diagnosis not present

## 2016-11-04 DIAGNOSIS — Z992 Dependence on renal dialysis: Secondary | ICD-10-CM | POA: Diagnosis not present

## 2016-11-07 ENCOUNTER — Other Ambulatory Visit: Payer: Self-pay

## 2016-11-07 DIAGNOSIS — Z992 Dependence on renal dialysis: Secondary | ICD-10-CM | POA: Diagnosis not present

## 2016-11-07 DIAGNOSIS — N186 End stage renal disease: Secondary | ICD-10-CM | POA: Diagnosis not present

## 2016-11-08 ENCOUNTER — Other Ambulatory Visit: Payer: Medicare Other

## 2016-11-08 ENCOUNTER — Ambulatory Visit
Admission: RE | Admit: 2016-11-08 | Discharge: 2016-11-08 | Disposition: A | Discharge status: Home / Self Care | Payer: Medicare Other | Source: Ambulatory Visit | Attending: Nephrology | Admitting: Nephrology

## 2016-11-08 DIAGNOSIS — R188 Other ascites: Secondary | ICD-10-CM | POA: Insufficient documentation

## 2016-11-09 DIAGNOSIS — Z992 Dependence on renal dialysis: Secondary | ICD-10-CM | POA: Diagnosis not present

## 2016-11-09 DIAGNOSIS — N186 End stage renal disease: Secondary | ICD-10-CM | POA: Diagnosis not present

## 2016-11-11 DIAGNOSIS — N186 End stage renal disease: Secondary | ICD-10-CM | POA: Diagnosis not present

## 2016-11-11 DIAGNOSIS — Z992 Dependence on renal dialysis: Secondary | ICD-10-CM | POA: Diagnosis not present

## 2016-11-14 ENCOUNTER — Other Ambulatory Visit: Payer: Self-pay

## 2016-11-14 DIAGNOSIS — Z992 Dependence on renal dialysis: Secondary | ICD-10-CM | POA: Diagnosis not present

## 2016-11-14 DIAGNOSIS — N186 End stage renal disease: Secondary | ICD-10-CM | POA: Diagnosis not present

## 2016-11-15 ENCOUNTER — Ambulatory Visit: Admission: RE | Admit: 2016-11-15 | Payer: Medicare Other | Source: Ambulatory Visit

## 2016-11-16 DIAGNOSIS — N186 End stage renal disease: Secondary | ICD-10-CM | POA: Diagnosis not present

## 2016-11-16 DIAGNOSIS — Z992 Dependence on renal dialysis: Secondary | ICD-10-CM | POA: Diagnosis not present

## 2016-11-18 DIAGNOSIS — N186 End stage renal disease: Secondary | ICD-10-CM | POA: Diagnosis not present

## 2016-11-18 DIAGNOSIS — Z992 Dependence on renal dialysis: Secondary | ICD-10-CM | POA: Diagnosis not present

## 2016-11-21 ENCOUNTER — Other Ambulatory Visit: Payer: Self-pay

## 2016-11-21 DIAGNOSIS — N186 End stage renal disease: Secondary | ICD-10-CM | POA: Diagnosis not present

## 2016-11-21 DIAGNOSIS — Z992 Dependence on renal dialysis: Secondary | ICD-10-CM | POA: Diagnosis not present

## 2016-11-22 ENCOUNTER — Other Ambulatory Visit: Payer: Self-pay | Admitting: Nephrology

## 2016-11-22 ENCOUNTER — Ambulatory Visit
Admission: RE | Admit: 2016-11-22 | Discharge: 2016-11-22 | Disposition: A | Discharge status: Home / Self Care | Payer: Medicare Other | Source: Ambulatory Visit | Attending: Nephrology | Admitting: Nephrology

## 2016-11-22 ENCOUNTER — Ambulatory Visit: Payer: Self-pay

## 2016-11-22 ENCOUNTER — Other Ambulatory Visit: Payer: Medicare Other

## 2016-11-22 DIAGNOSIS — R188 Other ascites: Secondary | ICD-10-CM

## 2016-11-23 ENCOUNTER — Emergency Department
Admission: EM | Admit: 2016-11-23 | Discharge: 2016-11-23 | Disposition: A | Discharge status: Home / Self Care | Payer: Medicare Other | Attending: Emergency Medicine | Admitting: Emergency Medicine

## 2016-11-23 ENCOUNTER — Emergency Department: Payer: Medicare Other

## 2016-11-23 DIAGNOSIS — Z992 Dependence on renal dialysis: Secondary | ICD-10-CM | POA: Diagnosis not present

## 2016-11-23 DIAGNOSIS — I2510 Atherosclerotic heart disease of native coronary artery without angina pectoris: Secondary | ICD-10-CM | POA: Insufficient documentation

## 2016-11-23 DIAGNOSIS — Z79899 Other long term (current) drug therapy: Secondary | ICD-10-CM | POA: Diagnosis not present

## 2016-11-23 DIAGNOSIS — Z7982 Long term (current) use of aspirin: Secondary | ICD-10-CM | POA: Insufficient documentation

## 2016-11-23 DIAGNOSIS — E1122 Type 2 diabetes mellitus with diabetic chronic kidney disease: Secondary | ICD-10-CM | POA: Diagnosis not present

## 2016-11-23 DIAGNOSIS — Z7984 Long term (current) use of oral hypoglycemic drugs: Secondary | ICD-10-CM | POA: Diagnosis not present

## 2016-11-23 DIAGNOSIS — I509 Heart failure, unspecified: Secondary | ICD-10-CM | POA: Insufficient documentation

## 2016-11-23 DIAGNOSIS — I132 Hypertensive heart and chronic kidney disease with heart failure and with stage 5 chronic kidney disease, or end stage renal disease: Secondary | ICD-10-CM | POA: Diagnosis not present

## 2016-11-23 DIAGNOSIS — Z87891 Personal history of nicotine dependence: Secondary | ICD-10-CM | POA: Insufficient documentation

## 2016-11-23 DIAGNOSIS — R109 Unspecified abdominal pain: Secondary | ICD-10-CM | POA: Diagnosis not present

## 2016-11-23 DIAGNOSIS — R1084 Generalized abdominal pain: Secondary | ICD-10-CM | POA: Diagnosis not present

## 2016-11-23 DIAGNOSIS — N186 End stage renal disease: Secondary | ICD-10-CM | POA: Diagnosis not present

## 2016-11-23 LAB — COMPREHENSIVE METABOLIC PANEL
ALK PHOS: 32 U/L — AB (ref 38–126)
ALT: 10 U/L — AB (ref 14–54)
ANION GAP: 8 (ref 5–15)
AST: 20 U/L (ref 15–41)
Albumin: 2.5 g/dL — ABNORMAL LOW (ref 3.5–5.0)
BUN: 20 mg/dL (ref 6–20)
CALCIUM: 7.7 mg/dL — AB (ref 8.9–10.3)
CO2: 33 mmol/L — AB (ref 22–32)
CREATININE: 2.77 mg/dL — AB (ref 0.44–1.00)
Chloride: 100 mmol/L — ABNORMAL LOW (ref 101–111)
GFR calc non Af Amer: 17 mL/min — ABNORMAL LOW (ref >60)
GFR, EST AFRICAN AMERICAN: 19 mL/min — AB (ref >60)
Glucose, Bld: 93 mg/dL (ref 65–99)
Potassium: 2.8 mmol/L — ABNORMAL LOW (ref 3.5–5.1)
SODIUM: 141 mmol/L (ref 135–145)
Total Bilirubin: 0.5 mg/dL (ref 0.3–1.2)
Total Protein: 5.6 g/dL — ABNORMAL LOW (ref 6.5–8.1)

## 2016-11-23 LAB — CBC
HCT: 30.8 % — ABNORMAL LOW (ref 35.0–47.0)
Hemoglobin: 10.5 g/dL — ABNORMAL LOW (ref 12.0–16.0)
MCH: 28.7 pg (ref 26.0–34.0)
MCHC: 34 g/dL (ref 32.0–36.0)
MCV: 84.5 fL (ref 80.0–100.0)
PLATELETS: 238 10*3/uL (ref 150–440)
RBC: 3.65 MIL/uL — AB (ref 3.80–5.20)
RDW: 18.3 % — ABNORMAL HIGH (ref 11.5–14.5)
WBC: 5 10*3/uL (ref 3.6–11.0)

## 2016-11-23 LAB — LIPASE, BLOOD: Lipase: 40 U/L (ref 11–51)

## 2016-11-23 NOTE — ED Provider Notes (Signed)
[facility] Emergency Department Provider Note  ____________________________________________   First MD Initiated Contact with Patient 11/23/16 1137     (approximate)  I have reviewed the triage vital signs and the nursing notes.   HISTORY  Chief Complaint Abdominal Pain    HPI Melissa Bradshaw is a 66 y.o. female with extensive chronic medical history that includes end-stage renal disease on dialysis (she completed dialysis today), liver failure with chronic ascites requiring paracentesis (she had 5 L drained by interventional radiology yesterday), hypertension, and cardiac pacemaker.  She presents by EMS today for evaluation of gradual onset abdominal pain starting this morning.  She states she had no pain after her paracentesis yesterday but then gradually developed a burning pain and "fullness" that started around the right side of her abdomen near where they performed a paracentesis but also extends up in her epigastrium and feels similar to her chronic acid reflux.  She received several doses of which she describes as Kaopectate at dialysis as the pain was moderate to severe and she says it is "much better" than before and barely noticeable at this time.  She is not sure if it is all due to her acid reflux but she thought she should be sure.  She denies any fevers, shortness of breath, chest pain, nausea, vomiting.  She only urinates about once a day and that has been normal for her.  She denies any new weakness nor lightheadedness.  The particular made her symptoms worse and they seem to have gotten better after the Kaopectate or whatever oral acid reflux medication she was given at dialysis.  Past Medical History:  Diagnosis Date  . Anemia   . Aneurysm (HCC)    [place]  . Arthritis   . Cardiac pacemaker   . CHF (congestive heart failure) (HCC)   . Chronic kidney disease    ESRD  . Constipation   . Coronary artery disease   .  Dialysis patient (HCC)    Tues, Thurs, Sat  . Gastric ulcer   . Glaucoma (increased eye pressure)   . Headache   . Hypertension   . Presence of permanent cardiac pacemaker     Patient Active Problem List   Diagnosis Date Noted  . Complication of vascular access for dialysis 08/15/2016  . Anemia 08/08/2016  . Chronic kidney disease, stage IV (severe) (HCC) 08/08/2016  . Protein-calorie malnutrition, severe 06/01/2016  . Fluid overload 05/31/2016  . Anemia, chronic renal failure 01/31/2016  . End stage renal disease (HCC) 01/01/2015  . Iron deficiency anemia 11/01/2014  . Essential (primary) hypertension 11/08/2012  . Artificial cardiac pacemaker 11/08/2012  . Congenital cystic disease of kidney 11/08/2012  . Temporary cerebral vascular dysfunction 11/08/2012  . Tobacco abuse, in remission 11/08/2012  . 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 Abnormal presence of protein in urine 09/21/2011  . ADPKD (autosomal dominant polycystic kidney disease) 06/07/2011    Past Surgical History:  Procedure Laterality Date  . ANEURYSM COILING    . AV FISTULA PLACEMENT Left 07/20/2016   Procedure: ARTERIOVENOUS (AV) FISTULA CREATION;  Surgeon: Renford DillsGregory G Schnier, MD;  Location: [facility] ORS;  Service: Vascular;  Laterality: Left;  . AV FISTULA PLACEMENT Left 08/26/2016   Procedure: INSERTION OF ARTERIOVENOUS (AV) GORE-TEX GRAFT ARM;  Surgeon: Renford DillsGregory G Schnier, MD;  Location: [facility] ORS;  Service: Vascular;  Laterality: Left;  . COLONOSCOPY     2013  . DIALYSIS/PERMA CATHETER REMOVAL N/A 10/11/2016  Procedure: Dialysis/Perma Catheter Removal;  Surgeon: Renford Dills, MD;  Location: [facility] INVASIVE CV LAB;  Service: Cardiovascular;  Laterality: N/A;  . INSERT / REPLACE / REMOVE PACEMAKER    . PACEMAKER PLACEMENT    . PERIPHERAL VASCULAR CATHETERIZATION N/A 06/02/2016   Procedure: Dialysis/Perma Catheter Insertion;  Surgeon: Annice Needy, MD;  Location: [facility] INVASIVE CV LAB;  Service:  Cardiovascular;  Laterality: N/A;    Prior to Admission medications   Medication Sig Start Date End Date Taking? Authorizing Provider  latanoprost (XALATAN) 0.005 % ophthalmic solution Place 1 drop into both eyes at bedtime.   Yes [provider]  amLODipine (NORVASC) 10 MG tablet Take 10 mg by mouth daily.    [provider]  aspirin 81 MG tablet Take 81 mg by mouth daily.    [provider]  calcium acetate (PHOSLO) 667 MG capsule Take 1,334 mg by mouth 3 (three) times daily with meals.    [provider]  furosemide (LASIX) 20 MG tablet Take 20 mg by mouth daily.  05/17/16   [provider]  labetalol (NORMODYNE) 200 MG tablet Take 1 tablet (200 mg total) by mouth every morning. Patient taking differently: Take 200 mg by mouth 2 (two) times daily.  06/07/16   Hower, Cletis Athens, MD    Allergies Hydralazine hcl; Iodine; and Ivp dye [iodinated diagnostic agents]  Family History  Problem Relation Age of Onset  . Hypertension Mother   . Diabetes Father   . Cancer Sister   . Stroke Sister   . Hypertension Sister     Social History Social History  Substance Use Topics  . Smoking status: Former Smoker    Packs/day: 0.25    Years: 41.00    Types: Cigarettes  . Smokeless tobacco: Never Used  . Alcohol use No    Review of Systems Constitutional: No fever/chills Eyes: No visual changes. ENT: No sore throat. Cardiovascular: Denies chest pain. Respiratory: Denies shortness of breath. Gastrointestinal: +abdominal pain, Burning and "fullness" from the right side of her abdomen up through the epigastrium, now improved from prior.  No nausea, no vomiting.  No diarrhea.  No constipation. Genitourinary: No urinary changes Musculoskeletal: Negative for neck pain.  Negative for back pain. Integumentary: Negative for rash. Neurological: Negative for headaches, focal weakness or  numbness.   ____________________________________________   PHYSICAL EXAM:  VITAL SIGNS: ED Triage Vitals  Enc Vitals Group     BP 11/23/16 1133 136/71     Pulse Rate 11/23/16 1133 71     Resp 11/23/16 1133 14     Temp 11/23/16 1133 98.7 F (37.1 C)     Temp Source 11/23/16 1133 Oral     SpO2 11/23/16 1133 97 %     Weight 11/23/16 1128 58.6 kg (129 lb 3 oz)     Height 11/23/16 1128 1.651 m (5\' 5" )     Head Circumference --      Peak Flow --      Pain Score 11/23/16 1128 6     Pain Loc --      Pain Edu? --      Excl. in GC? --     Constitutional: Alert and oriented. Well appearing and in no acute distress. Eyes: Conjunctivae are normal.  Head: Atraumatic. Nose: No congestion/rhinnorhea. Mouth/Throat: Mucous membranes are moist. Neck: No stridor.  No meningeal signs.   Cardiovascular: Normal rate, regular rhythm.  Pacemaker is present.  Good peripheral circulation. Grossly normal heart sounds. Respiratory: Normal  respiratory effort.  No retractions. Lungs CTAB. Gastrointestinal: Somewhat tense and distended abdomen but per patient it is significantly improved compared to yesterday after 5 L was drained by paracentesis.  She does have generalized and diffuse tenderness to palpation but without any specific focal tenderness, however the focal exam is limited by the distention and remaining ascites. Musculoskeletal: No lower extremity tenderness nor edema. No gross deformities of extremities. Neurologic:  Normal speech and language. No gross focal neurologic deficits are appreciated.  Skin:  Skin is warm, dry and intact. No rash noted. Psychiatric: Mood and affect are normal. Speech and behavior are normal.  ____________________________________________   LABS (all labs ordered are listed, but only abnormal results are displayed)  Labs Reviewed  COMPREHENSIVE METABOLIC PANEL - Abnormal; Notable for the following:       Result Value   Potassium 2.8 (*)    Chloride 100 (*)     CO2 33 (*)    Creatinine, Ser 2.77 (*)    Calcium 7.7 (*)    Total Protein 5.6 (*)    Albumin 2.5 (*)    ALT 10 (*)    Alkaline Phosphatase 32 (*)    GFR calc non Af Amer 17 (*)    GFR calc Af Amer 19 (*)    All other components within normal limits  CBC - Abnormal; Notable for the following:    RBC 3.65 (*)    Hemoglobin 10.5 (*)    HCT 30.8 (*)    RDW 18.3 (*)    All other components within normal limits  LIPASE, BLOOD   ____________________________________________  EKG  None - EKG not ordered by ED physician ____________________________________________  RADIOLOGY   US Paracentesis  Result Date: 11/22/2016 INDICATION: Recurrent Ascites EXAM: ULTRASOUND GUIDED therapeutic PARACENTESIS MEDICATIONS: None. COMPLICATIONS: None immediate. PROCEDURE: Informed written consent was obtained from the patient after a discussion of the risks, benefits and alternatives to treatment. A timeout was performed prior to the initiation of the procedure. Initial ultrasound scanning demonstrates a large amount of ascites within the right lower abdominal quadrant. The right lower abdomen was prepped and draped in the usual sterile fashion. 1% lidocaine with epinephrine was used for local anesthesia. Following this, a 6 Fr Safe-T-Centesis catheter was introduced. An ultrasound image was saved for documentation purposes. The paracentesis was performed. The catheter was removed and a dressing was applied. The patient tolerated the procedure well without immediate post procedural complication. FINDINGS: A total of approximately 5 L of clear yellow fluid was removed. IMPRESSION: Successful ultrasound-guided paracentesis yielding 5 liters of peritoneal fluid. Electronically Signed   By: Alcide Clever M.D.   On: 11/22/2016 15:47   Dg Abdomen Acute W/chest  Result Date: 11/23/2016 CLINICAL DATA:  Abdominal pain after dialysis.  Recent paracentesis. EXAM: DG ABDOMEN ACUTE W/ 1V CHEST COMPARISON:  CT 06/05/2016  FINDINGS: Left dual-chamber cardiac pacemaker. Lungs are clear except there is blunting of the costophrenic angles probably representing small effusions. Heart size is normal. Negative for pulmonary edema. There is no evidence for free intraperitoneal air. Paucity of bowel gas in the right abdomen compatible with an enlarged liver containing multiple cysts. Mild scoliosis in lumbar spine. Aorta and iliac arteries are heavily calcified. Calcifications in the pelvis compatible with phleboliths. Gas and stool in the colon. IMPRESSION: Nonobstructive bowel gas pattern. Displacement of bowel structures into the lower abdomen related to known hepatomegaly and possibly ascites. Probable small pleural effusions.  No pulmonary edema. Electronically Signed   By: Madelaine Bhat  Lowella Dandy M.D.   On: 11/23/2016 12:46    ____________________________________________   PROCEDURES  Critical Care performed: No   Procedure(s) performed:   Procedures   ____________________________________________   INITIAL IMPRESSION / ASSESSMENT AND PLAN / ED COURSE  Pertinent labs & imaging results that were available during my care of the patient were reviewed by me and considered in my medical decision making (see chart for details).  The patient is well-appearing and in spite of her chief complaint and has normal vital signs.  She has been through paracentesis and number of times and states she has not felt like this afterwards, but she states that she is relatively sure this is her acid reflux given that it is much better now after getting some acid reflux medicine at dialysis.  She is just concerned due to her number of chronic medical issues.  Given that she just had paracentesis, my main concern would be to try to rule out iatrogenic complications of the procedure.  Infection is less likely but should be considered.  Basic labs are pending and I will get an acute abdomen series to look for unexpected free air or other acute  abnormalities and determine if she would benefit from a CT scan.  She agrees with the plan and stated that she definitely does not want to stay in the hospital unless it is absolutely necessary because she needs to get home to her adult son that has some developmental disabilities.   Clinical Course as of Nov 23 1328  Wed Nov 23, 2016  1328 The patient states she feels completely better and wants to go home.  Her lab work is reassuring as are her radiographs showing no acute abnormalities.  I gave my usual and customary return precautions.     [CF]    Clinical Course User Index [CF] Loleta Rose, MD    ____________________________________________  FINAL CLINICAL IMPRESSION(S) / ED DIAGNOSES  Final diagnoses:  Generalized abdominal pain     MEDICATIONS GIVEN DURING THIS VISIT:  Medications - No data to display   NEW OUTPATIENT MEDICATIONS STARTED DURING THIS VISIT:  New Prescriptions   No medications on file    Modified Medications   No medications on file    Discontinued Medications   No medications on file     Note:  This document was prepared using Dragon voice recognition software and may include unintentional dictation errors.    Loleta Rose, MD 11/23/16 1330

## 2016-11-23 NOTE — Discharge Instructions (Signed)

## 2016-11-23 NOTE — ED Triage Notes (Signed)
Pt bib EMS from dialysis w/ c/o abd pain. Pt sts she was here yesterday and had 5L taken off w/ paracentesis. Pt sts she finished dialysis, unsure how much fluid taken off.  Pt c/o abd fullness/tighness that started at dialysis, felt fine after d/c yesterday.  Pt denies n/v/d, SOB or fevers.  Pt A/OX4.

## 2016-11-25 DIAGNOSIS — Z992 Dependence on renal dialysis: Secondary | ICD-10-CM | POA: Diagnosis not present

## 2016-11-25 DIAGNOSIS — N186 End stage renal disease: Secondary | ICD-10-CM | POA: Diagnosis not present

## 2016-11-25 NOTE — Discharge Instructions (Signed)
Paracentesis, Care After  Refer to this sheet in the next few weeks. These instructions provide you with information about caring for yourself after your procedure. Your health care provider may also give you more specific instructions. Your treatment has been planned according to current medical practices, but problems sometimes occur. Call your health care provider if you have any problems or questions after your procedure.  What can I expect after the procedure?  After your procedure, it is common to have a small amount of clear fluid coming from the puncture site.  Follow these instructions at home:  · Return to your normal activities as told by your health care provider. Ask your health care provider what activities are safe for you.  · Take over-the-counter and prescription medicines only as told by your health care provider.  · Do not take baths, swim, or use a hot tub until your health care provider approves.  · Follow instructions from your health care provider about:  ? How to take care of your puncture site.  ? When and how you should change your bandage (dressing).  ? When you should remove your dressing.  · Check your puncture area every day signs of infection. Watch for:  ? Redness, swelling, or pain.  ? Fluid, blood, or pus.  · Keep all follow-up visits as told by your health care provider. This is important.  Contact a health care provider if:  · You have redness, swelling, or pain at your puncture site.  · You start to have more clear fluid coming from your puncture site.  · You have blood or pus coming from your puncture site.  · You have chills.  · You have a fever.  Get help right away if:  · You develop chest pain or shortness of breath.  · You develop increasing pain, discomfort, or swelling in your abdomen.  · You feel dizzy or light-headed or you pass out.  This information is not intended to replace advice given to you by your health care provider. Make sure you discuss any questions you  have with your health care provider.  Document Released: 11/04/2014 Document Revised: 11/26/2015 Document Reviewed: 09/02/2014  Elsevier Interactive Patient Education © 2017 [employer]

## 2016-11-28 ENCOUNTER — Other Ambulatory Visit: Payer: Self-pay

## 2016-11-29 ENCOUNTER — Ambulatory Visit
Admission: RE | Admit: 2016-11-29 | Discharge: 2016-11-29 | Disposition: A | Discharge status: Home / Self Care | Payer: Medicare Other | Source: Ambulatory Visit | Attending: Nephrology | Admitting: Nephrology

## 2016-11-29 DIAGNOSIS — R188 Other ascites: Secondary | ICD-10-CM | POA: Diagnosis not present

## 2016-11-29 NOTE — Procedures (Signed)
Paracentesis. Amount pending EBL 0 Comp 0 

## 2016-11-30 ENCOUNTER — Other Ambulatory Visit: Payer: Self-pay | Admitting: Nephrology

## 2016-11-30 DIAGNOSIS — R188 Other ascites: Secondary | ICD-10-CM

## 2016-11-30 DIAGNOSIS — Z992 Dependence on renal dialysis: Secondary | ICD-10-CM | POA: Diagnosis not present

## 2016-11-30 DIAGNOSIS — N186 End stage renal disease: Secondary | ICD-10-CM | POA: Diagnosis not present

## 2016-12-01 DIAGNOSIS — Z992 Dependence on renal dialysis: Secondary | ICD-10-CM | POA: Diagnosis not present

## 2016-12-01 DIAGNOSIS — N183 Chronic kidney disease, stage 3 (moderate): Secondary | ICD-10-CM | POA: Diagnosis not present

## 2016-12-01 DIAGNOSIS — I498 Other specified cardiac arrhythmias: Secondary | ICD-10-CM | POA: Diagnosis not present

## 2016-12-01 DIAGNOSIS — K259 Gastric ulcer, unspecified as acute or chronic, without hemorrhage or perforation: Secondary | ICD-10-CM | POA: Diagnosis not present

## 2016-12-01 DIAGNOSIS — I10 Essential (primary) hypertension: Secondary | ICD-10-CM | POA: Diagnosis not present

## 2016-12-01 DIAGNOSIS — N186 End stage renal disease: Secondary | ICD-10-CM | POA: Diagnosis not present

## 2016-12-02 DIAGNOSIS — N186 End stage renal disease: Secondary | ICD-10-CM | POA: Diagnosis not present

## 2016-12-02 DIAGNOSIS — Z992 Dependence on renal dialysis: Secondary | ICD-10-CM | POA: Diagnosis not present

## 2016-12-05 ENCOUNTER — Other Ambulatory Visit: Payer: Self-pay

## 2016-12-05 DIAGNOSIS — N186 End stage renal disease: Secondary | ICD-10-CM | POA: Diagnosis not present

## 2016-12-05 DIAGNOSIS — Z992 Dependence on renal dialysis: Secondary | ICD-10-CM | POA: Diagnosis not present

## 2016-12-05 NOTE — Discharge Instructions (Signed)
Paracentesis, Care After °Refer to this sheet in the next few weeks. These instructions provide you with information about caring for yourself after your procedure. Your health care provider may also give you more specific instructions. Your treatment has been planned according to current medical practices, but problems sometimes occur. Call your health care provider if you have any problems or questions after your procedure. °What can I expect after the procedure? °After your procedure, it is common to have a small amount of clear fluid coming from the puncture site. °Follow these instructions at home: °· Return to your normal activities as told by your health care provider. Ask your health care provider what activities are safe for you. °· Take over-the-counter and prescription medicines only as told by your health care provider. °· Do not take baths, swim, or use a hot tub until your health care provider approves. °· Follow instructions from your health care provider about: °? How to take care of your puncture site. °? When and how you should change your bandage (dressing). °? When you should remove your dressing. °· Check your puncture area every day signs of infection. Watch for: °? Redness, swelling, or pain. °? Fluid, blood, or pus. °· Keep all follow-up visits as told by your health care provider. This is important. °Contact a health care provider if: °· You have redness, swelling, or pain at your puncture site. °· You start to have more clear fluid coming from your puncture site. °· You have blood or pus coming from your puncture site. °· You have chills. °· You have a fever. °Get help right away if: °· You develop chest pain or shortness of breath. °· You develop increasing pain, discomfort, or swelling in your abdomen. °· You feel dizzy or light-headed or you pass out. °This information is not intended to replace advice given to you by your health care provider. Make sure you discuss any questions you  have with your health care provider. °Document Released: 11/04/2014 Document Revised: 11/26/2015 Document Reviewed: 09/02/2014 °Elsevier Interactive Patient Education © 2018 [employer] Inc[employer] ° °

## 2016-12-06 ENCOUNTER — Encounter (INDEPENDENT_AMBULATORY_CARE_PROVIDER_SITE_OTHER): Payer: Medicare Other | Admitting: Gastroenterology

## 2016-12-06 ENCOUNTER — Other Ambulatory Visit: Payer: Medicare Other

## 2016-12-06 ENCOUNTER — Other Ambulatory Visit: Payer: Self-pay | Admitting: Nephrology

## 2016-12-06 ENCOUNTER — Ambulatory Visit: Payer: Self-pay

## 2016-12-06 ENCOUNTER — Encounter: Payer: Self-pay | Admitting: Gastroenterology

## 2016-12-06 ENCOUNTER — Other Ambulatory Visit: Payer: Self-pay

## 2016-12-06 ENCOUNTER — Ambulatory Visit
Admission: RE | Admit: 2016-12-06 | Discharge: 2016-12-06 | Disposition: A | Discharge status: Home / Self Care | Payer: Medicare Other | Source: Ambulatory Visit | Attending: Nephrology | Admitting: Nephrology

## 2016-12-06 DIAGNOSIS — R188 Other ascites: Secondary | ICD-10-CM

## 2016-12-06 NOTE — Procedures (Signed)
PROCEDURE SUMMARY:  Successful US guided paracentesis from right lateral abdomen.  Yielded 4.7 liters of clear, yellow fluid.  No immediate complications.  Pt tolerated well.   Specimen was not sent for labs.  Hoyt KochKacie Sue-Ellen Matthews PA-C 12/06/2016 11:15 AM

## 2016-12-09 DIAGNOSIS — Z992 Dependence on renal dialysis: Secondary | ICD-10-CM | POA: Diagnosis not present

## 2016-12-09 DIAGNOSIS — N186 End stage renal disease: Secondary | ICD-10-CM | POA: Diagnosis not present

## 2016-12-12 ENCOUNTER — Other Ambulatory Visit: Payer: Self-pay

## 2016-12-12 DIAGNOSIS — Z992 Dependence on renal dialysis: Secondary | ICD-10-CM | POA: Diagnosis not present

## 2016-12-12 DIAGNOSIS — N186 End stage renal disease: Secondary | ICD-10-CM | POA: Diagnosis not present

## 2016-12-13 ENCOUNTER — Encounter: Payer: Self-pay | Admitting: Gastroenterology

## 2016-12-13 ENCOUNTER — Ambulatory Visit
Admission: RE | Admit: 2016-12-13 | Discharge: 2016-12-13 | Disposition: A | Discharge status: Home / Self Care | Payer: Medicare Other | Source: Ambulatory Visit | Attending: Nephrology | Admitting: Nephrology

## 2016-12-13 ENCOUNTER — Ambulatory Visit: Payer: Medicare Other | Admitting: Gastroenterology

## 2016-12-13 DIAGNOSIS — R188 Other ascites: Secondary | ICD-10-CM | POA: Diagnosis not present

## 2016-12-13 NOTE — Procedures (Signed)
US paracentesis without difficulty  Complications:  None  Blood Loss: none  See dictation in canopy pacs  

## 2016-12-14 DIAGNOSIS — N186 End stage renal disease: Secondary | ICD-10-CM | POA: Diagnosis not present

## 2016-12-14 DIAGNOSIS — Z992 Dependence on renal dialysis: Secondary | ICD-10-CM | POA: Diagnosis not present

## 2016-12-16 DIAGNOSIS — N186 End stage renal disease: Secondary | ICD-10-CM | POA: Diagnosis not present

## 2016-12-16 DIAGNOSIS — Z992 Dependence on renal dialysis: Secondary | ICD-10-CM | POA: Diagnosis not present

## 2016-12-19 ENCOUNTER — Other Ambulatory Visit: Payer: Self-pay

## 2016-12-19 DIAGNOSIS — N186 End stage renal disease: Secondary | ICD-10-CM | POA: Diagnosis not present

## 2016-12-19 DIAGNOSIS — Z992 Dependence on renal dialysis: Secondary | ICD-10-CM | POA: Diagnosis not present

## 2016-12-20 ENCOUNTER — Ambulatory Visit
Admission: RE | Admit: 2016-12-20 | Discharge: 2016-12-20 | Disposition: A | Discharge status: Home / Self Care | Payer: Medicare Other | Source: Ambulatory Visit | Attending: Nephrology | Admitting: Nephrology

## 2016-12-20 ENCOUNTER — Ambulatory Visit: Payer: Self-pay

## 2016-12-20 ENCOUNTER — Other Ambulatory Visit: Payer: Medicare Other

## 2016-12-20 DIAGNOSIS — R188 Other ascites: Secondary | ICD-10-CM | POA: Insufficient documentation

## 2016-12-21 DIAGNOSIS — Z992 Dependence on renal dialysis: Secondary | ICD-10-CM | POA: Diagnosis not present

## 2016-12-21 DIAGNOSIS — N186 End stage renal disease: Secondary | ICD-10-CM | POA: Diagnosis not present

## 2016-12-23 DIAGNOSIS — N186 End stage renal disease: Secondary | ICD-10-CM | POA: Diagnosis not present

## 2016-12-23 DIAGNOSIS — Z992 Dependence on renal dialysis: Secondary | ICD-10-CM | POA: Diagnosis not present

## 2016-12-26 ENCOUNTER — Other Ambulatory Visit: Payer: Self-pay

## 2016-12-26 DIAGNOSIS — N186 End stage renal disease: Secondary | ICD-10-CM | POA: Diagnosis not present

## 2016-12-26 DIAGNOSIS — Z992 Dependence on renal dialysis: Secondary | ICD-10-CM | POA: Diagnosis not present

## 2016-12-27 ENCOUNTER — Ambulatory Visit
Admission: RE | Admit: 2016-12-27 | Discharge: 2016-12-27 | Disposition: A | Discharge status: Home / Self Care | Payer: Medicare Other | Source: Ambulatory Visit | Attending: Nephrology | Admitting: Nephrology

## 2016-12-27 ENCOUNTER — Other Ambulatory Visit: Payer: Self-pay | Admitting: Nephrology

## 2016-12-27 DIAGNOSIS — R188 Other ascites: Secondary | ICD-10-CM

## 2016-12-27 NOTE — Procedures (Signed)
US paracentesis.  Removed 5 liters of yellow fluid. Minimal blood loss. No immediate complication.

## 2016-12-28 DIAGNOSIS — Z992 Dependence on renal dialysis: Secondary | ICD-10-CM | POA: Diagnosis not present

## 2016-12-28 DIAGNOSIS — N186 End stage renal disease: Secondary | ICD-10-CM | POA: Diagnosis not present

## 2016-12-30 DIAGNOSIS — Z992 Dependence on renal dialysis: Secondary | ICD-10-CM | POA: Diagnosis not present

## 2016-12-30 DIAGNOSIS — N186 End stage renal disease: Secondary | ICD-10-CM | POA: Diagnosis not present

## 2016-12-31 DIAGNOSIS — Z992 Dependence on renal dialysis: Secondary | ICD-10-CM | POA: Diagnosis not present

## 2016-12-31 DIAGNOSIS — N186 End stage renal disease: Secondary | ICD-10-CM | POA: Diagnosis not present

## 2017-01-01 DIAGNOSIS — Z992 Dependence on renal dialysis: Secondary | ICD-10-CM | POA: Diagnosis not present

## 2017-01-01 DIAGNOSIS — N186 End stage renal disease: Secondary | ICD-10-CM | POA: Diagnosis not present

## 2017-01-02 ENCOUNTER — Other Ambulatory Visit: Payer: Self-pay

## 2017-01-02 DIAGNOSIS — N186 End stage renal disease: Secondary | ICD-10-CM | POA: Diagnosis not present

## 2017-01-02 DIAGNOSIS — Z992 Dependence on renal dialysis: Secondary | ICD-10-CM | POA: Diagnosis not present

## 2017-01-03 ENCOUNTER — Ambulatory Visit
Admission: RE | Admit: 2017-01-03 | Discharge: 2017-01-03 | Disposition: A | Discharge status: Home / Self Care | Payer: Medicare Other | Source: Ambulatory Visit | Attending: Nephrology | Admitting: Nephrology

## 2017-01-03 ENCOUNTER — Ambulatory Visit: Payer: Self-pay

## 2017-01-03 ENCOUNTER — Other Ambulatory Visit: Payer: Medicare Other

## 2017-01-03 DIAGNOSIS — R188 Other ascites: Secondary | ICD-10-CM | POA: Insufficient documentation

## 2017-01-04 DIAGNOSIS — Z992 Dependence on renal dialysis: Secondary | ICD-10-CM | POA: Diagnosis not present

## 2017-01-04 DIAGNOSIS — N186 End stage renal disease: Secondary | ICD-10-CM | POA: Diagnosis not present

## 2017-01-05 NOTE — Discharge Instructions (Signed)
Paracentesis, Care After °Refer to this sheet in the next few weeks. These instructions provide you with information about caring for yourself after your procedure. Your health care provider may also give you more specific instructions. Your treatment has been planned according to current medical practices, but problems sometimes occur. Call your health care provider if you have any problems or questions after your procedure. °What can I expect after the procedure? °After your procedure, it is common to have a small amount of clear fluid coming from the puncture site. °Follow these instructions at home: °· Return to your normal activities as told by your health care provider. Ask your health care provider what activities are safe for you. °· Take over-the-counter and prescription medicines only as told by your health care provider. °· Do not take baths, swim, or use a hot tub until your health care provider approves. °· Follow instructions from your health care provider about: °? How to take care of your puncture site. °? When and how you should change your bandage (dressing). °? When you should remove your dressing. °· Check your puncture area every day signs of infection. Watch for: °? Redness, swelling, or pain. °? Fluid, blood, or pus. °· Keep all follow-up visits as told by your health care provider. This is important. °Contact a health care provider if: °· You have redness, swelling, or pain at your puncture site. °· You start to have more clear fluid coming from your puncture site. °· You have blood or pus coming from your puncture site. °· You have chills. °· You have a fever. °Get help right away if: °· You develop chest pain or shortness of breath. °· You develop increasing pain, discomfort, or swelling in your abdomen. °· You feel dizzy or light-headed or you pass out. °This information is not intended to replace advice given to you by your health care provider. Make sure you discuss any questions you  have with your health care provider. °Document Released: 11/04/2014 Document Revised: 11/26/2015 Document Reviewed: 09/02/2014 °Elsevier Interactive Patient Education © 2018 [employer] Inc[employer] ° °

## 2017-01-06 DIAGNOSIS — N186 End stage renal disease: Secondary | ICD-10-CM | POA: Diagnosis not present

## 2017-01-06 DIAGNOSIS — Z992 Dependence on renal dialysis: Secondary | ICD-10-CM | POA: Diagnosis not present

## 2017-01-09 ENCOUNTER — Other Ambulatory Visit: Payer: Self-pay

## 2017-01-09 DIAGNOSIS — N186 End stage renal disease: Secondary | ICD-10-CM | POA: Diagnosis not present

## 2017-01-09 DIAGNOSIS — E119 Type 2 diabetes mellitus without complications: Secondary | ICD-10-CM | POA: Diagnosis not present

## 2017-01-09 DIAGNOSIS — Z992 Dependence on renal dialysis: Secondary | ICD-10-CM | POA: Diagnosis not present

## 2017-01-10 ENCOUNTER — Encounter: Payer: Self-pay | Admitting: Gastroenterology

## 2017-01-10 ENCOUNTER — Ambulatory Visit
Admission: RE | Admit: 2017-01-10 | Discharge: 2017-01-10 | Disposition: A | Discharge status: Home / Self Care | Payer: Medicare Other | Source: Ambulatory Visit | Attending: Nephrology | Admitting: Nephrology

## 2017-01-10 ENCOUNTER — Ambulatory Visit (INDEPENDENT_AMBULATORY_CARE_PROVIDER_SITE_OTHER): Payer: Medicare Other | Admitting: Gastroenterology

## 2017-01-10 VITALS — BP 112/64 | HR 73 | Temp 98.8°F | Ht 65.0 in | Wt 129.8 lb

## 2017-01-10 DIAGNOSIS — R188 Other ascites: Secondary | ICD-10-CM | POA: Insufficient documentation

## 2017-01-10 LAB — BODY FLUID CELL COUNT WITH DIFFERENTIAL
Eos, Fluid: 0 %
LYMPHS FL: 51 %
MONOCYTE-MACROPHAGE-SEROUS FLUID: 47 %
NEUTROPHIL FLUID: 2 %
Other Cells, Fluid: 0 %
WBC FLUID: 243 uL

## 2017-01-10 LAB — ALBUMIN, PLEURAL OR PERITONEAL FLUID: Albumin, Fluid: 1 g/dL

## 2017-01-10 LAB — LACTATE DEHYDROGENASE, PLEURAL OR PERITONEAL FLUID: LD, Fluid: 52 U/L — ABNORMAL HIGH (ref 3–23)

## 2017-01-10 NOTE — Progress Notes (Addendum)
Primary Care Physician: Corky Downs, MD  Primary Gastroenterologist:  Dr. Midge Minium  Chief Complaint  Patient presents with  . Ascites    follow up    HPI: Melissa Bradshaw is a 66 y.o. female here for follow-up for her ascites. The patient had been seen in the past for the ascites and had her ascitic albumin sent off which was 2.2.  Her most proximal serum albumin was 2.5.  The patient also did not have any imaging suggestive of cirrhosis or any sign of portal hypertension.  The patient was sent to be seen immediately for her ascites that needs to be tapped every 2 weeks.  The patient reports that she is supposed to have a paracentesis today. The patient's platelets are also historically normal.  The patient has renal failure and is on hemodialysis.  Her most recent echocardiogram from November showed a moderate size pericardial effusion.  Current Outpatient Prescriptions  Medication Sig Dispense Refill  . Amino Acid Infusion (PROSOL) 20 % SOLN     . amLODipine (NORVASC) 10 MG tablet Take 10 mg by mouth daily.    Marland Kitchen aspirin 81 MG tablet Take 81 mg by mouth daily.    . calcium acetate (PHOSLO) 667 MG capsule Take 1,334 mg by mouth 3 (three) times daily with meals.    . furosemide (LASIX) 20 MG tablet Take 20 mg by mouth daily.     Marland Kitchen labetalol (NORMODYNE) 200 MG tablet Take 1 tablet (200 mg total) by mouth every morning. (Patient taking differently: Take 200 mg by mouth 2 (two) times daily. ) 30 tablet 0  . latanoprost (XALATAN) 0.005 % ophthalmic solution Place 1 drop into both eyes at bedtime.    . lidocaine-prilocaine (EMLA) cream   0   Current Facility-Administered Medications  Medication Dose Route Frequency Provider Last Rate Last Dose  . ceFAZolin (ANCEF) IVPB 1 g/50 mL premix  1 g Intravenous Once Schnier, Latina Craver, MD        Allergies as of 01/10/2017 - Review Complete 01/10/2017  Allergen Reaction Noted  . Hydralazine hcl Other (See Comments) 08/16/2016  . Iodine  Itching and Rash 10/28/2014  . Ivp dye [iodinated diagnostic agents] Rash 10/28/2014    ROS:  General: Negative for anorexia, weight loss, fever, chills, fatigue, weakness. ENT: Negative for hoarseness, difficulty swallowing , nasal congestion. CV: Negative for chest pain, angina, palpitations, dyspnea on exertion, peripheral edema.  Respiratory: Negative for dyspnea at rest, dyspnea on exertion, cough, sputum, wheezing.  GI: See history of present illness. GU:  Negative for dysuria, hematuria, urinary incontinence, urinary frequency, nocturnal urination.  Endo: Negative for unusual weight change.    Physical Examination:   BP 112/64   Pulse 73   Temp 98.8 F (37.1 C) (Oral)   Ht 5\' 5"  (1.651 m)   Wt 129 lb 12.8 oz (58.9 kg)   BMI 21.60 kg/m   General: Well-nourished, well-developed in no acute distress.  Eyes: No icterus. Conjunctivae pink. Neuro: Alert and oriented x 3.  Grossly intact. Skin: Warm and dry, no jaundice.   Psych: Alert and cooperative, normal mood and affect.  Labs:    Imaging Studies: US Paracentesis  Result Date: 01/10/2017 INDICATION: Recurrent ascites, chronic renal failure, abdominal distension EXAM: ULTRASOUND GUIDED RIGHT PARACENTESIS MEDICATIONS: 1% lidocaine locally COMPLICATIONS: None immediate. PROCEDURE: Informed written consent was obtained from the patient after a discussion of the risks, benefits and alternatives to treatment. A timeout was performed prior to the initiation of  the procedure. Initial ultrasound scanning demonstrates a large amount of ascites within the right lower abdominal quadrant. The right lower abdomen was prepped and draped in the usual sterile fashion. 1% lidocaine with epinephrine was used for local anesthesia. Following this, a 6 Fr Safe-T-Centesis catheter was introduced. An ultrasound image was saved for documentation purposes. The paracentesis was performed. The catheter was removed and a dressing was applied. The patient  tolerated the procedure well without immediate post procedural complication. FINDINGS: A total of approximately 4.5 L of clear peritoneal fluid was removed. Sample was not sent for laboratory analysis IMPRESSION: Successful ultrasound-guided paracentesis yielding 4.5 liters of peritoneal fluid. Electronically Signed   By: Judie PetitM.  Shick M.D.   On: 01/10/2017 15:49   [place] Paracentesis  Result Date: 01/03/2017 CLINICAL DATA:  Renal failure, recurrent abdominal ascites. EXAM: ULTRASOUND GUIDED PARACENTESIS TECHNIQUE: The procedure, risks (including but not limited to bleeding, infection, organ damage ), benefits, and alternatives were explained to the patient. Questions regarding the procedure were encouraged and answered. The patient understands and consents to the procedure. Survey ultrasound of the abdomen was performed and an appropriate skin entry site in the right lower quadrant of the abdomen was selected. Skin site was marked, prepped with chlorhexadine, draped in usual sterile fashion, and infiltrated locally with 1% lidocaine. A Safe-T-Centesis needle was advanced into the peritoneal space until fluid could be aspirated. The sheath was advanced and the needle removed. 4.3 L of clear yellow ascites were aspirated. The patient tolerated the procedure well. COMPLICATIONS: COMPLICATIONS none IMPRESSION: Technically successful ultrasound guided paracentesis, removing 4.3 L ascites. Electronically Signed   By: Corlis Leak  Hassell M.D.   On: 01/03/2017 15:21   [place] Paracentesis  Result Date: 12/27/2016 INDICATION: End-stage renal disease.  Recurrent ascites. EXAM: ULTRASOUND GUIDED PARACENTESIS MEDICATIONS: None. COMPLICATIONS: None immediate. PROCEDURE: Informed written consent was obtained from the patient after a discussion of the risks, benefits and alternatives to treatment. A timeout was performed prior to the initiation of the procedure. Initial ultrasound scanning demonstrates a large amount of ascites within the right  lower abdominal quadrant. The right lower abdomen was prepped and draped in the usual sterile fashion. 1% lidocaine was used for local anesthesia. Following this, a Safe-T-Centesis catheter was introduced. An ultrasound image was saved for documentation purposes. The paracentesis was performed. The catheter was removed and a dressing was applied. The patient tolerated the procedure well without immediate post procedural complication. FINDINGS: A total of approximately 5 L of yellow fluid was removed. IMPRESSION: Successful ultrasound-guided paracentesis yielding 5 liters of peritoneal fluid. Electronically Signed   By: Richarda OverlieAdam  Henn M.D.   On: 12/27/2016 16:47   [place] Paracentesis  Result Date: 12/20/2016 INDICATION: Recurrent ascites, abdominal distension EXAM: ULTRASOUND GUIDED PARACENTESIS MEDICATIONS: 1% lidocaine locally COMPLICATIONS: None immediate. PROCEDURE: An ultrasound guided paracentesis was thoroughly discussed with the patient and questions answered. The benefits, risks, alternatives and complications were also discussed. The patient understands and wishes to proceed with the procedure. Written consent was obtained. Ultrasound was performed to localize and mark an adequate pocket of fluid in the right lower quadrant of the abdomen. The area was then prepped and draped in the normal sterile fashion. 1% Lidocaine was used for local anesthesia. Under ultrasound guidance a 6 [place] Safe-T-Centesis was introduced. Paracentesis was performed. The catheter was removed and a dressing applied. FINDINGS: A total of approximately 4.65 L of clear peritoneal fluid was removed. A fluid sample was not sent for laboratory analysis. IMPRESSION: Successful ultrasound guided paracentesis  yielding 4.65 L of ascites. Electronically Signed   By: Judie Petit.  Shick M.D.   On: 12/20/2016 15:26   US Paracentesis  Result Date: 12/13/2016 INDICATION: Recurrent ascites EXAM: ULTRASOUND GUIDED therapeutic PARACENTESIS MEDICATIONS: None.  COMPLICATIONS: None immediate. PROCEDURE: Informed written consent was obtained from the patient after a discussion of the risks, benefits and alternatives to treatment. A timeout was performed prior to the initiation of the procedure. Initial ultrasound scanning demonstrates a large amount of ascites within the right lower abdominal quadrant. The right lower abdomen was prepped and draped in the usual sterile fashion. 1% lidocaine with epinephrine was used for local anesthesia. Following this, a 6 Fr Safe-T-Centesis catheter was introduced. An ultrasound image was saved for documentation purposes. The paracentesis was performed. The catheter was removed and a dressing was applied. The patient tolerated the procedure well without immediate post procedural complication. FINDINGS: A total of approximately 4.5 L of clear yellow fluid was removed. IMPRESSION: Successful ultrasound-guided paracentesis yielding 4.5 liters of peritoneal fluid. Electronically Signed   By: Alcide Clever M.D.   On: 12/13/2016 10:46    Assessment and Plan:   ROWEN HUR is a 66 y.o. y/o female with ascites that does not appear to be from a liver source. The patient is supposed to have a paracenteses today.  I've asked the patient's paracentesis be sent off for LDH, cytology, albumin and a cell count. The patient will also have her blood sent off for CA 125 and albumin.  I have spoken to her primary care provider and her nephrologist today about my thoughts of this not being due to cirrhosis.  The patient will be contacted with the results of her tests.   Midge Minium, MD. Clementeen Graham   Note: This dictation was prepared with Dragon dictation along with smaller phrase technology. Any transcriptional errors that result from this process are unintentional.

## 2017-01-11 ENCOUNTER — Encounter: Payer: Self-pay | Admitting: Gastroenterology

## 2017-01-11 DIAGNOSIS — Z992 Dependence on renal dialysis: Secondary | ICD-10-CM | POA: Diagnosis not present

## 2017-01-11 DIAGNOSIS — N186 End stage renal disease: Secondary | ICD-10-CM | POA: Diagnosis not present

## 2017-01-11 NOTE — Progress Notes (Signed)
Appointment cancelled

## 2017-01-12 LAB — CYTOLOGY - NON PAP

## 2017-01-13 DIAGNOSIS — N186 End stage renal disease: Secondary | ICD-10-CM | POA: Diagnosis not present

## 2017-01-13 DIAGNOSIS — Z992 Dependence on renal dialysis: Secondary | ICD-10-CM | POA: Diagnosis not present

## 2017-01-13 NOTE — Discharge Instructions (Signed)
Paracentesis, Care After °Refer to this sheet in the next few weeks. These instructions provide you with information about caring for yourself after your procedure. Your health care provider may also give you more specific instructions. Your treatment has been planned according to current medical practices, but problems sometimes occur. Call your health care provider if you have any problems or questions after your procedure. °What can I expect after the procedure? °After your procedure, it is common to have a small amount of clear fluid coming from the puncture site. °Follow these instructions at home: °· Return to your normal activities as told by your health care provider. Ask your health care provider what activities are safe for you. °· Take over-the-counter and prescription medicines only as told by your health care provider. °· Do not take baths, swim, or use a hot tub until your health care provider approves. °· Follow instructions from your health care provider about: °? How to take care of your puncture site. °? When and how you should change your bandage (dressing). °? When you should remove your dressing. °· Check your puncture area every day signs of infection. Watch for: °? Redness, swelling, or pain. °? Fluid, blood, or pus. °· Keep all follow-up visits as told by your health care provider. This is important. °Contact a health care provider if: °· You have redness, swelling, or pain at your puncture site. °· You start to have more clear fluid coming from your puncture site. °· You have blood or pus coming from your puncture site. °· You have chills. °· You have a fever. °Get help right away if: °· You develop chest pain or shortness of breath. °· You develop increasing pain, discomfort, or swelling in your abdomen. °· You feel dizzy or light-headed or you pass out. °This information is not intended to replace advice given to you by your health care provider. Make sure you discuss any questions you  have with your health care provider. °Document Released: 11/04/2014 Document Revised: 11/26/2015 Document Reviewed: 09/02/2014 °Elsevier Interactive Patient Education © 2018 [employer] Inc[employer] ° °

## 2017-01-14 LAB — BODY FLUID CULTURE: Culture: NO GROWTH

## 2017-01-16 ENCOUNTER — Other Ambulatory Visit: Payer: Self-pay

## 2017-01-16 DIAGNOSIS — Z992 Dependence on renal dialysis: Secondary | ICD-10-CM | POA: Diagnosis not present

## 2017-01-16 DIAGNOSIS — N186 End stage renal disease: Secondary | ICD-10-CM | POA: Diagnosis not present

## 2017-01-17 ENCOUNTER — Ambulatory Visit: Payer: Self-pay

## 2017-01-17 ENCOUNTER — Other Ambulatory Visit: Payer: Medicare Other

## 2017-01-17 ENCOUNTER — Ambulatory Visit
Admission: RE | Admit: 2017-01-17 | Discharge: 2017-01-17 | Disposition: A | Discharge status: Home / Self Care | Payer: Medicare Other | Source: Ambulatory Visit | Attending: Nephrology | Admitting: Nephrology

## 2017-01-17 DIAGNOSIS — R188 Other ascites: Secondary | ICD-10-CM | POA: Insufficient documentation

## 2017-01-17 NOTE — Procedures (Signed)
Pre Procedural Dx: Symptomatic Ascites Post Procedural Dx: Same  Successful US guided paracentesis yielding 5 L of serous ascitic fluid.  EBL: None Complications: None immediate  Jay Tamanna Whitson, MD Pager #: [phone]   

## 2017-01-18 DIAGNOSIS — Z992 Dependence on renal dialysis: Secondary | ICD-10-CM | POA: Diagnosis not present

## 2017-01-18 DIAGNOSIS — N186 End stage renal disease: Secondary | ICD-10-CM | POA: Diagnosis not present

## 2017-01-19 ENCOUNTER — Ambulatory Visit (INDEPENDENT_AMBULATORY_CARE_PROVIDER_SITE_OTHER): Payer: Medicare Other | Admitting: Vascular Surgery

## 2017-01-20 DIAGNOSIS — Z992 Dependence on renal dialysis: Secondary | ICD-10-CM | POA: Diagnosis not present

## 2017-01-20 DIAGNOSIS — N186 End stage renal disease: Secondary | ICD-10-CM | POA: Diagnosis not present

## 2017-01-23 ENCOUNTER — Other Ambulatory Visit: Payer: Self-pay

## 2017-01-23 DIAGNOSIS — N186 End stage renal disease: Secondary | ICD-10-CM | POA: Diagnosis not present

## 2017-01-23 DIAGNOSIS — Z992 Dependence on renal dialysis: Secondary | ICD-10-CM | POA: Diagnosis not present

## 2017-01-24 ENCOUNTER — Ambulatory Visit: Admission: RE | Admit: 2017-01-24 | Payer: Medicare Other | Source: Ambulatory Visit

## 2017-01-24 ENCOUNTER — Ambulatory Visit
Admission: RE | Admit: 2017-01-24 | Discharge: 2017-01-24 | Disposition: A | Discharge status: Home / Self Care | Payer: Medicare Other | Source: Ambulatory Visit | Attending: Nephrology | Admitting: Nephrology

## 2017-01-24 DIAGNOSIS — R188 Other ascites: Secondary | ICD-10-CM | POA: Insufficient documentation

## 2017-01-24 NOTE — Procedures (Signed)
Recurrent ascites  S/p lg vol para  No comp Stable Full report in pacs

## 2017-01-25 DIAGNOSIS — N186 End stage renal disease: Secondary | ICD-10-CM | POA: Diagnosis not present

## 2017-01-25 DIAGNOSIS — Z992 Dependence on renal dialysis: Secondary | ICD-10-CM | POA: Diagnosis not present

## 2017-01-27 DIAGNOSIS — Z992 Dependence on renal dialysis: Secondary | ICD-10-CM | POA: Diagnosis not present

## 2017-01-27 DIAGNOSIS — N186 End stage renal disease: Secondary | ICD-10-CM | POA: Diagnosis not present

## 2017-01-30 ENCOUNTER — Other Ambulatory Visit: Payer: Self-pay | Admitting: Nephrology

## 2017-01-30 ENCOUNTER — Other Ambulatory Visit: Payer: Self-pay

## 2017-01-30 DIAGNOSIS — R188 Other ascites: Secondary | ICD-10-CM

## 2017-01-30 DIAGNOSIS — N186 End stage renal disease: Secondary | ICD-10-CM | POA: Diagnosis not present

## 2017-01-30 DIAGNOSIS — Z992 Dependence on renal dialysis: Secondary | ICD-10-CM | POA: Diagnosis not present

## 2017-01-31 ENCOUNTER — Other Ambulatory Visit: Payer: Medicare Other

## 2017-01-31 ENCOUNTER — Ambulatory Visit: Payer: Medicare Other

## 2017-01-31 ENCOUNTER — Ambulatory Visit: Payer: Self-pay

## 2017-01-31 ENCOUNTER — Ambulatory Visit
Admission: RE | Admit: 2017-01-31 | Discharge: 2017-01-31 | Disposition: A | Discharge status: Home / Self Care | Payer: Medicare Other | Source: Ambulatory Visit | Attending: Nephrology | Admitting: Nephrology

## 2017-01-31 DIAGNOSIS — N186 End stage renal disease: Secondary | ICD-10-CM | POA: Diagnosis not present

## 2017-01-31 DIAGNOSIS — R188 Other ascites: Secondary | ICD-10-CM | POA: Insufficient documentation

## 2017-01-31 DIAGNOSIS — Z992 Dependence on renal dialysis: Secondary | ICD-10-CM | POA: Diagnosis not present

## 2017-01-31 NOTE — Discharge Instructions (Signed)
Paracentesis, Care After  Refer to this sheet in the next few weeks. These instructions provide you with information about caring for yourself after your procedure. Your health care provider may also give you more specific instructions. Your treatment has been planned according to current medical practices, but problems sometimes occur. Call your health care provider if you have any problems or questions after your procedure.  What can I expect after the procedure?  After your procedure, it is common to have a small amount of clear fluid coming from the puncture site.  Follow these instructions at home:   Return to your normal activities as told by your health care provider. Ask your health care provider what activities are safe for you.   Take over-the-counter and prescription medicines only as told by your health care provider.   Do not take baths, swim, or use a hot tub until your health care provider approves.   Follow instructions from your health care provider about:  ? How to take care of your puncture site.  ? When and how you should change your bandage (dressing).  ? When you should remove your dressing.   Check your puncture area every day signs of infection. Watch for:  ? Redness, swelling, or pain.  ? Fluid, blood, or pus.   Keep all follow-up visits as told by your health care provider. This is important.  Contact a health care provider if:   You have redness, swelling, or pain at your puncture site.   You start to have more clear fluid coming from your puncture site.   You have blood or pus coming from your puncture site.   You have chills.   You have a fever.  Get help right away if:   You develop chest pain or shortness of breath.   You develop increasing pain, discomfort, or swelling in your abdomen.   You feel dizzy or light-headed or you pass out.  This information is not intended to replace advice given to you by your health care provider. Make sure you discuss any questions you  have with your health care provider.  Document Released: 11/04/2014 Document Revised: 11/26/2015 Document Reviewed: 09/02/2014  Elsevier Interactive Patient Education  2018 [employer]  Paracentesis  Paracentesis is a procedure to remove excess fluid (ascites) from the belly (abdomen). Ascites can result from certain conditions, such as infection, inflammation, abdominal injury, heart failure, chronic scarring of the liver (cirrhosis), or cancer. Ascites is removed using a needle that is inserted through the skin and tissue into the abdomen.  This procedure may be done:   To determine the cause of the ascites.   To relieve symptoms that are caused by the ascites, such as pain or shortness of breath.   To see if there is bleeding after an abdominal injury.    Tell a health care provider about:   Any allergies you have.   All medicines you are taking, including vitamins, herbs, eye drops, creams, and over-the-counter medicines.   Any problems you or family members have had with anesthetic medicines.   Any blood disorders you have.   Any surgeries you have had.   Any medical conditions you have.   Whether you are pregnant or may be pregnant.  What are the risks?  Generally, this is a safe procedure. However, problems may occur, including:   Infection.   Bleeding.   Injury to an abdominal organ, such as the bowel (large intestine), liver, spleen, or bladder.     Low blood pressure (hypotension).   Spreading of cancer, if there are cancer cells in the abdominal fluid.   Mental status changes in people who have liver disease. These changes would be caused by shifts in the balance of fluids and minerals (electrolytes) in the body.    What happens before the procedure?   Ask your health care provider about:  ? Changing or stopping your regular medicines. This is especially important if you are taking diabetes medicines or blood thinners.  ? Taking medicines such as aspirin and ibuprofen. These medicines  can thin your blood. Do not take these medicines before your procedure if your health care provider instructs you not to.   A blood sample may be done to determine your blood clotting time.   You will be asked to urinate.  What happens during the procedure?   You may be asked to lie on your back with your head raised (elevated).   To reduce your risk of infection:  ? Your health care team will wash or sanitize their hands.  ? Your skin will be washed with soap.   You will be given a medicine to numb the area (local anesthetic).   Your abdominal skin will be punctured with a needle or a scalpel.   A drainage tube will be inserted through the puncture site. Fluid will drain through the tube into a container.   After enough fluid has been removed, the tube will be removed.   A sample of the fluid will be sent for examination.   A bandage (dressing) will be placed over the puncture site.  The procedure may vary among health care providers and hospitals.  What happens after the procedure?   It is your responsibility to get your test results. Ask your health care provider or the department performing the test when your results will be ready.  This information is not intended to replace advice given to you by your health care provider. Make sure you discuss any questions you have with your health care provider.  Document Released: 01/03/2005 Document Revised: 11/26/2015 Document Reviewed: 09/02/2014  Elsevier Interactive Patient Education  2018 [employer]

## 2017-01-31 NOTE — Procedures (Signed)
PROCEDURE SUMMARY:  Successful US guided paracentesis from left lateral abdomen.  Yielded 5 liters of clear yellow fluid.  No immediate complications.  Pt tolerated well.    Jerry CarasWENDY S Luba Matzen PA-C 01/31/2017 12:43 PM

## 2017-02-01 DIAGNOSIS — N186 End stage renal disease: Secondary | ICD-10-CM | POA: Diagnosis not present

## 2017-02-01 DIAGNOSIS — Z992 Dependence on renal dialysis: Secondary | ICD-10-CM | POA: Diagnosis not present

## 2017-02-03 DIAGNOSIS — N186 End stage renal disease: Secondary | ICD-10-CM | POA: Diagnosis not present

## 2017-02-03 DIAGNOSIS — Z992 Dependence on renal dialysis: Secondary | ICD-10-CM | POA: Diagnosis not present

## 2017-02-06 ENCOUNTER — Other Ambulatory Visit: Payer: Self-pay

## 2017-02-06 DIAGNOSIS — Z992 Dependence on renal dialysis: Secondary | ICD-10-CM | POA: Diagnosis not present

## 2017-02-06 DIAGNOSIS — N186 End stage renal disease: Secondary | ICD-10-CM | POA: Diagnosis not present

## 2017-02-07 ENCOUNTER — Ambulatory Visit
Admission: RE | Admit: 2017-02-07 | Discharge: 2017-02-07 | Disposition: A | Discharge status: Home / Self Care | Payer: Medicare Other | Source: Ambulatory Visit | Attending: Nephrology | Admitting: Nephrology

## 2017-02-07 ENCOUNTER — Other Ambulatory Visit: Payer: Self-pay | Admitting: Nephrology

## 2017-02-07 DIAGNOSIS — Z91041 Radiographic dye allergy status: Secondary | ICD-10-CM | POA: Insufficient documentation

## 2017-02-07 DIAGNOSIS — R188 Other ascites: Secondary | ICD-10-CM | POA: Diagnosis not present

## 2017-02-07 NOTE — Procedures (Signed)
US paracentesis without difficulty  Complications:  None  Blood Loss: none  See dictation in canopy pacs  

## 2017-02-08 DIAGNOSIS — Z992 Dependence on renal dialysis: Secondary | ICD-10-CM | POA: Diagnosis not present

## 2017-02-08 DIAGNOSIS — N186 End stage renal disease: Secondary | ICD-10-CM | POA: Diagnosis not present

## 2017-02-10 DIAGNOSIS — N186 End stage renal disease: Secondary | ICD-10-CM | POA: Diagnosis not present

## 2017-02-10 DIAGNOSIS — Z992 Dependence on renal dialysis: Secondary | ICD-10-CM | POA: Diagnosis not present

## 2017-02-10 NOTE — Discharge Instructions (Signed)
Paracentesis, Care After °Refer to this sheet in the next few weeks. These instructions provide you with information about caring for yourself after your procedure. Your health care provider may also give you more specific instructions. Your treatment has been planned according to current medical practices, but problems sometimes occur. Call your health care provider if you have any problems or questions after your procedure. °What can I expect after the procedure? °After your procedure, it is common to have a small amount of clear fluid coming from the puncture site. °Follow these instructions at home: °· Return to your normal activities as told by your health care provider. Ask your health care provider what activities are safe for you. °· Take over-the-counter and prescription medicines only as told by your health care provider. °· Do not take baths, swim, or use a hot tub until your health care provider approves. °· Follow instructions from your health care provider about: °? How to take care of your puncture site. °? When and how you should change your bandage (dressing). °? When you should remove your dressing. °· Check your puncture area every day signs of infection. Watch for: °? Redness, swelling, or pain. °? Fluid, blood, or pus. °· Keep all follow-up visits as told by your health care provider. This is important. °Contact a health care provider if: °· You have redness, swelling, or pain at your puncture site. °· You start to have more clear fluid coming from your puncture site. °· You have blood or pus coming from your puncture site. °· You have chills. °· You have a fever. °Get help right away if: °· You develop chest pain or shortness of breath. °· You develop increasing pain, discomfort, or swelling in your abdomen. °· You feel dizzy or light-headed or you pass out. °This information is not intended to replace advice given to you by your health care provider. Make sure you discuss any questions you  have with your health care provider. °Document Released: 11/04/2014 Document Revised: 11/26/2015 Document Reviewed: 09/02/2014 °Elsevier Interactive Patient Education © 2018 [employer] Inc[employer] ° °

## 2017-02-13 ENCOUNTER — Other Ambulatory Visit: Payer: Self-pay

## 2017-02-14 ENCOUNTER — Ambulatory Visit
Admission: RE | Admit: 2017-02-14 | Discharge: 2017-02-14 | Disposition: A | Discharge status: Home / Self Care | Payer: Medicare Other | Source: Ambulatory Visit | Attending: Nephrology | Admitting: Nephrology

## 2017-02-14 ENCOUNTER — Other Ambulatory Visit: Payer: Medicare Other

## 2017-02-14 ENCOUNTER — Ambulatory Visit: Payer: Self-pay

## 2017-02-14 DIAGNOSIS — R188 Other ascites: Secondary | ICD-10-CM | POA: Diagnosis not present

## 2017-02-14 NOTE — Procedures (Signed)
Ultrasound-guided  therapeutic paracentesis performed yielding 5 liters of yellow fluid. No immediate complications.  

## 2017-02-15 DIAGNOSIS — N186 End stage renal disease: Secondary | ICD-10-CM | POA: Diagnosis not present

## 2017-02-15 DIAGNOSIS — Z992 Dependence on renal dialysis: Secondary | ICD-10-CM | POA: Diagnosis not present

## 2017-02-17 DIAGNOSIS — N186 End stage renal disease: Secondary | ICD-10-CM | POA: Diagnosis not present

## 2017-02-17 DIAGNOSIS — Z992 Dependence on renal dialysis: Secondary | ICD-10-CM | POA: Diagnosis not present

## 2017-02-20 ENCOUNTER — Other Ambulatory Visit: Payer: Self-pay

## 2017-02-20 DIAGNOSIS — Z992 Dependence on renal dialysis: Secondary | ICD-10-CM | POA: Diagnosis not present

## 2017-02-20 DIAGNOSIS — N186 End stage renal disease: Secondary | ICD-10-CM | POA: Diagnosis not present

## 2017-02-21 ENCOUNTER — Ambulatory Visit
Admission: RE | Admit: 2017-02-21 | Discharge: 2017-02-21 | Disposition: A | Discharge status: Home / Self Care | Payer: Medicare Other | Source: Ambulatory Visit | Attending: Nephrology | Admitting: Nephrology

## 2017-02-21 DIAGNOSIS — R188 Other ascites: Secondary | ICD-10-CM | POA: Insufficient documentation

## 2017-02-21 NOTE — Procedures (Signed)
US guided paracentesis.  Removed 5 liters of yellow fluid.  Minimal blood loss and no immediate complication. 

## 2017-02-22 DIAGNOSIS — N186 End stage renal disease: Secondary | ICD-10-CM | POA: Diagnosis not present

## 2017-02-22 DIAGNOSIS — Z992 Dependence on renal dialysis: Secondary | ICD-10-CM | POA: Diagnosis not present

## 2017-02-24 DIAGNOSIS — N186 End stage renal disease: Secondary | ICD-10-CM | POA: Diagnosis not present

## 2017-02-24 DIAGNOSIS — Z992 Dependence on renal dialysis: Secondary | ICD-10-CM | POA: Diagnosis not present

## 2017-02-27 DIAGNOSIS — N186 End stage renal disease: Secondary | ICD-10-CM | POA: Diagnosis not present

## 2017-02-27 DIAGNOSIS — Z992 Dependence on renal dialysis: Secondary | ICD-10-CM | POA: Diagnosis not present

## 2017-02-28 ENCOUNTER — Ambulatory Visit: Payer: Medicare Other

## 2017-02-28 ENCOUNTER — Other Ambulatory Visit: Payer: Self-pay | Admitting: Internal Medicine

## 2017-02-28 ENCOUNTER — Other Ambulatory Visit: Payer: Self-pay | Admitting: Nephrology

## 2017-02-28 ENCOUNTER — Ambulatory Visit
Admission: RE | Admit: 2017-02-28 | Discharge: 2017-02-28 | Disposition: A | Discharge status: Home / Self Care | Payer: Medicare Other | Source: Ambulatory Visit | Attending: Nephrology | Admitting: Nephrology

## 2017-02-28 DIAGNOSIS — R188 Other ascites: Secondary | ICD-10-CM

## 2017-02-28 NOTE — Procedures (Signed)
PROCEDURE SUMMARY:  Successful US guided paracentesis from left lateral abdomen.  Yielded 5.0 liters of yellow fluid.  No immediate complications.  Pt tolerated well.   Specimen was sent for labs.  Hoyt Koch PA-C 02/28/2017 10:51 AM

## 2017-02-28 NOTE — Discharge Instructions (Signed)
Paracentesis, Care After °Refer to this sheet in the next few weeks. These instructions provide you with information about caring for yourself after your procedure. Your health care provider may also give you more specific instructions. Your treatment has been planned according to current medical practices, but problems sometimes occur. Call your health care provider if you have any problems or questions after your procedure. °What can I expect after the procedure? °After your procedure, it is common to have a small amount of clear fluid coming from the puncture site. °Follow these instructions at home: °· Return to your normal activities as told by your health care provider. Ask your health care provider what activities are safe for you. °· Take over-the-counter and prescription medicines only as told by your health care provider. °· Do not take baths, swim, or use a hot tub until your health care provider approves. °· Follow instructions from your health care provider about: °? How to take care of your puncture site. °? When and how you should change your bandage (dressing). °? When you should remove your dressing. °· Check your puncture area every day signs of infection. Watch for: °? Redness, swelling, or pain. °? Fluid, blood, or pus. °· Keep all follow-up visits as told by your health care provider. This is important. °Contact a health care provider if: °· You have redness, swelling, or pain at your puncture site. °· You start to have more clear fluid coming from your puncture site. °· You have blood or pus coming from your puncture site. °· You have chills. °· You have a fever. °Get help right away if: °· You develop chest pain or shortness of breath. °· You develop increasing pain, discomfort, or swelling in your abdomen. °· You feel dizzy or light-headed or you pass out. °This information is not intended to replace advice given to you by your health care provider. Make sure you discuss any questions you  have with your health care provider. °Document Released: 11/04/2014 Document Revised: 11/26/2015 Document Reviewed: 09/02/2014 °Elsevier Interactive Patient Education © 2018 [employer] Inc[employer] ° °

## 2017-03-01 DIAGNOSIS — Z992 Dependence on renal dialysis: Secondary | ICD-10-CM | POA: Diagnosis not present

## 2017-03-01 DIAGNOSIS — N186 End stage renal disease: Secondary | ICD-10-CM | POA: Diagnosis not present

## 2017-03-03 DIAGNOSIS — Z992 Dependence on renal dialysis: Secondary | ICD-10-CM | POA: Diagnosis not present

## 2017-03-03 DIAGNOSIS — N186 End stage renal disease: Secondary | ICD-10-CM | POA: Diagnosis not present

## 2017-03-04 DIAGNOSIS — N186 End stage renal disease: Secondary | ICD-10-CM | POA: Diagnosis not present

## 2017-03-04 DIAGNOSIS — Z992 Dependence on renal dialysis: Secondary | ICD-10-CM | POA: Diagnosis not present

## 2017-03-04 DIAGNOSIS — Z23 Encounter for immunization: Secondary | ICD-10-CM | POA: Diagnosis not present

## 2017-03-05 DIAGNOSIS — Z23 Encounter for immunization: Secondary | ICD-10-CM | POA: Diagnosis not present

## 2017-03-05 DIAGNOSIS — N186 End stage renal disease: Secondary | ICD-10-CM | POA: Diagnosis not present

## 2017-03-05 DIAGNOSIS — Z992 Dependence on renal dialysis: Secondary | ICD-10-CM | POA: Diagnosis not present

## 2017-03-06 DIAGNOSIS — Z992 Dependence on renal dialysis: Secondary | ICD-10-CM | POA: Diagnosis not present

## 2017-03-06 DIAGNOSIS — Z23 Encounter for immunization: Secondary | ICD-10-CM | POA: Diagnosis not present

## 2017-03-06 DIAGNOSIS — N186 End stage renal disease: Secondary | ICD-10-CM | POA: Diagnosis not present

## 2017-03-07 ENCOUNTER — Ambulatory Visit
Admission: RE | Admit: 2017-03-07 | Discharge: 2017-03-07 | Disposition: A | Discharge status: Home / Self Care | Payer: Medicare Other | Source: Ambulatory Visit | Attending: Nephrology | Admitting: Nephrology

## 2017-03-07 DIAGNOSIS — R188 Other ascites: Secondary | ICD-10-CM | POA: Insufficient documentation

## 2017-03-07 NOTE — Procedures (Signed)
US guided paracentesis.  Removed 5 liters of yellow fluid.  Needed to stick twice because catheter stopped after approximately 1 liter.  Minimal blood loss.  No immediate complication.

## 2017-03-08 DIAGNOSIS — Z992 Dependence on renal dialysis: Secondary | ICD-10-CM | POA: Diagnosis not present

## 2017-03-08 DIAGNOSIS — Z23 Encounter for immunization: Secondary | ICD-10-CM | POA: Diagnosis not present

## 2017-03-08 DIAGNOSIS — N186 End stage renal disease: Secondary | ICD-10-CM | POA: Diagnosis not present

## 2017-03-09 ENCOUNTER — Ambulatory Visit (INDEPENDENT_AMBULATORY_CARE_PROVIDER_SITE_OTHER): Payer: Medicare Other | Admitting: Vascular Surgery

## 2017-03-09 ENCOUNTER — Encounter (INDEPENDENT_AMBULATORY_CARE_PROVIDER_SITE_OTHER): Payer: Self-pay | Admitting: Vascular Surgery

## 2017-03-09 VITALS — BP 148/83 | HR 85 | Resp 15 | Ht 65.5 in | Wt 136.0 lb

## 2017-03-09 DIAGNOSIS — I10 Essential (primary) hypertension: Secondary | ICD-10-CM | POA: Diagnosis not present

## 2017-03-09 DIAGNOSIS — I726 Aneurysm of vertebral artery: Secondary | ICD-10-CM | POA: Diagnosis not present

## 2017-03-09 DIAGNOSIS — T829XXS Unspecified complication of cardiac and vascular prosthetic device, implant and graft, sequela: Secondary | ICD-10-CM | POA: Diagnosis not present

## 2017-03-09 DIAGNOSIS — N186 End stage renal disease: Secondary | ICD-10-CM

## 2017-03-09 DIAGNOSIS — Z992 Dependence on renal dialysis: Secondary | ICD-10-CM

## 2017-03-10 DIAGNOSIS — Z992 Dependence on renal dialysis: Secondary | ICD-10-CM | POA: Diagnosis not present

## 2017-03-10 DIAGNOSIS — Z23 Encounter for immunization: Secondary | ICD-10-CM | POA: Diagnosis not present

## 2017-03-10 DIAGNOSIS — N186 End stage renal disease: Secondary | ICD-10-CM | POA: Diagnosis not present

## 2017-03-10 NOTE — Discharge Instructions (Signed)
Paracentesis, Care After °Refer to this sheet in the next few weeks. These instructions provide you with information about caring for yourself after your procedure. Your health care provider may also give you more specific instructions. Your treatment has been planned according to current medical practices, but problems sometimes occur. Call your health care provider if you have any problems or questions after your procedure. °What can I expect after the procedure? °After your procedure, it is common to have a small amount of clear fluid coming from the puncture site. °Follow these instructions at home: °· Return to your normal activities as told by your health care provider. Ask your health care provider what activities are safe for you. °· Take over-the-counter and prescription medicines only as told by your health care provider. °· Do not take baths, swim, or use a hot tub until your health care provider approves. °· Follow instructions from your health care provider about: °? How to take care of your puncture site. °? When and how you should change your bandage (dressing). °? When you should remove your dressing. °· Check your puncture area every day signs of infection. Watch for: °? Redness, swelling, or pain. °? Fluid, blood, or pus. °· Keep all follow-up visits as told by your health care provider. This is important. °Contact a health care provider if: °· You have redness, swelling, or pain at your puncture site. °· You start to have more clear fluid coming from your puncture site. °· You have blood or pus coming from your puncture site. °· You have chills. °· You have a fever. °Get help right away if: °· You develop chest pain or shortness of breath. °· You develop increasing pain, discomfort, or swelling in your abdomen. °· You feel dizzy or light-headed or you pass out. °This information is not intended to replace advice given to you by your health care provider. Make sure you discuss any questions you  have with your health care provider. °Document Released: 11/04/2014 Document Revised: 11/26/2015 Document Reviewed: 09/02/2014 °Elsevier Interactive Patient Education © 2018 [employer] Inc[employer] ° °

## 2017-03-11 NOTE — Progress Notes (Signed)
MRN : [id number]  Melissa Bradshaw is a 66 y.o. (1951/06/29) female who presents with chief complaint of  Chief Complaint  Patient presents with  . Follow-up    Discuss PD cath placement  .  History of Present Illness: The patient returns to the office for follow up regarding problem with the dialysis access. Currently the patient is maintained via a left brachial axillary graft created on 08/26/2016.    The patient has also been informed that there is increased recirculation and she is having problems with controlling her ascities.    The patient denies hand pain or other symptoms consistent with steal phenomena.  No significant arm swelling.  The patient denies redness or swelling at the access site. The patient denies fever or chills at home or while on dialysis.  The patient denies amaurosis fugax or recent TIA symptoms. There are no recent neurological changes noted. The patient denies claudication symptoms or rest pain symptoms. The patient denies history of DVT, PE or superficial thrombophlebitis. The patient denies recent episodes of angina or shortness of breath.      No outpatient prescriptions have been marked as taking for the 03/09/17 encounter (Office Visit) with Gilda Crease, Latina Craver, MD.   Current Facility-Administered Medications for the 03/09/17 encounter (Office Visit) with Gilda Crease, Latina Craver, MD  Medication  . ceFAZolin (ANCEF) IVPB 1 g/50 mL premix    Past Medical History:  Diagnosis Date  . Anemia   . Aneurysm (HCC)    Cerebral, [facility] [place] [place]  . Arthritis   . Cardiac pacemaker   . CHF (congestive heart failure) (HCC)   . Chronic kidney disease    ESRD  . Constipation   . Coronary artery disease   . Dialysis patient (HCC)    Tues, Thurs, Sat  . Gastric ulcer   . Glaucoma (increased eye pressure)   . Headache   . Hypertension   . Presence of permanent cardiac pacemaker     Past Surgical History:  Procedure Laterality Date  . ANEURYSM  COILING    . AV FISTULA PLACEMENT Left 07/20/2016   Procedure: ARTERIOVENOUS (AV) FISTULA CREATION;  Surgeon: Renford Dills, MD;  Location: [facility] ORS;  Service: Vascular;  Laterality: Left;  . AV FISTULA PLACEMENT Left 08/26/2016   Procedure: INSERTION OF ARTERIOVENOUS (AV) GORE-TEX GRAFT ARM;  Surgeon: Renford Dills, MD;  Location: [facility] ORS;  Service: Vascular;  Laterality: Left;  . COLONOSCOPY     2013  . DIALYSIS/PERMA CATHETER REMOVAL N/A 10/11/2016   Procedure: Dialysis/Perma Catheter Removal;  Surgeon: Renford Dills, MD;  Location: [facility] INVASIVE CV LAB;  Service: Cardiovascular;  Laterality: N/A;  . INSERT / REPLACE / REMOVE PACEMAKER    . PACEMAKER PLACEMENT    . PERIPHERAL VASCULAR CATHETERIZATION N/A 06/02/2016   Procedure: Dialysis/Perma Catheter Insertion;  Surgeon: Annice Needy, MD;  Location: [facility] INVASIVE CV LAB;  Service: Cardiovascular;  Laterality: N/A;    Social History Social History  Substance Use Topics  . Smoking status: Current Every Day Smoker    Packs/day: 0.25    Years: 41.00    Types: Cigarettes  . Smokeless tobacco: Never Used  . Alcohol use No    Family History Family History  Problem Relation Age of Onset  . Hypertension Mother   . Diabetes Father   . Cancer Sister   . Stroke Sister   . Hypertension Sister     Allergies  Allergen Reactions  . Hydralazine Hcl Other (See  Comments)    Makes pt jittery, nervous, messes with vision   . Iodine Itching and Rash    Unknown reaction  . Ivp Dye [Iodinated Diagnostic Agents] Rash     REVIEW OF SYSTEMS (Negative unless checked)  Constitutional: Weight loss  Fever  Chills Cardiac: Chest pain   Chest pressure   Palpitations   Shortness of breath when laying flat   Shortness of breath with exertion. Vascular:  Pain in legs with walking   Pain in legs at rest  History of DVT   Phlebitis   Swelling in legs   Varicose veins   Non-healing ulcers Pulmonary:   Uses  home oxygen   Productive cough   Hemoptysis   Wheeze  COPD   Asthma Neurologic:  Dizziness   Seizures   History of stroke   History of TIA  Aphasia   Vissual changes   Weakness or numbness in arm   Weakness or numbness in leg Musculoskeletal:   Joint swelling   Joint pain   Low back pain Hematologic:  Easy bruising  Easy bleeding   Hypercoagulable state   Anemic Gastrointestinal:  ascites   Vomiting  Gastroesophageal reflux/heartburn   Difficulty swallowing. Genitourinary:  Chronic kidney disease   Difficult urination  Frequent urination   Blood in urine Skin:  Rashes   Ulcers  Psychological:  History of anxiety    History of major depression.  Physical Examination  Vitals:   03/09/17 0831  BP: (!) 148/83  Pulse: 85  Resp: 15  Weight: 61.7 kg (136 lb)  Height: 5' 5.5" (1.664 m)   Body mass index is 22.29 kg/m. Gen: WD/WN, NAD Head: [place]/AT, No temporalis wasting.  Ear/Nose/Throat: Hearing grossly intact, nares w/o erythema or drainage Eyes: PER, EOMI, sclera nonicteric.  Neck: Supple, no large masses.   Pulmonary:  Good air movement, no audible wheezing bilaterally, no use of accessory muscles.  Cardiac: RRR, no JVD Vascular: left arm brachial axillary AV graft good thrill and good bruit Vessel Right Left  Radial Palpable Palpable  Ulnar Palpable Palpable  Brachial Palpable Palpable  Gastrointestinal: Very distended. Tense with fluid wave.  No guarding/no peritoneal signs.  Musculoskeletal: M/S 5/5 throughout.  No deformity or atrophy.  Neurologic: CN 2-12 intact. Symmetrical.  Speech is fluent. Motor exam as listed above. Psychiatric: Judgment intact, Mood & affect appropriate for pt's clinical situation. Dermatologic: No rashes or ulcers noted.  No changes consistent with cellulitis. Lymph : No lichenification or skin changes of chronic lymphedema.  CBC Lab Results  Component Value Date   WBC 5.0 11/23/2016     HGB 10.5 (L) 11/23/2016   HCT 30.8 (L) 11/23/2016   MCV 84.5 11/23/2016   PLT 238 11/23/2016    BMET    Component Value Date/Time   NA 141 11/23/2016 1128   NA 141 01/30/2014 1505   K 2.8 (L) 11/23/2016 1128   K 5.0 01/30/2014 1505   CL 100 (L) 11/23/2016 1128   CL 116 (H) 01/30/2014 1505   CO2 33 (H) 11/23/2016 1128   CO2 17 (L) 01/30/2014 1505   GLUCOSE 93 11/23/2016 1128   GLUCOSE 93 01/30/2014 1505   BUN 20 11/23/2016 1128   BUN 50 (H) 06/19/2014 1413   CREATININE 2.77 (H) 11/23/2016 1128   CREATININE 3.33 (H) 06/19/2014 1413   CALCIUM 7.7 (L) 11/23/2016 1128   CALCIUM 8.8 01/30/2014 1505   GFRNONAA 17 (L) 11/23/2016 1128   GFRNONAA 15 (L) 06/19/2014 1413   GFRNONAA 14 (  L) 01/30/2014 1505   GFRAA 19 (L) 11/23/2016 1128   GFRAA 18 (L) 06/19/2014 1413   GFRAA 16 (L) 01/30/2014 1505   CrCl cannot be calculated (Patient's most recent lab result is older than the maximum 21 days allowed.).  COAG Lab Results  Component Value Date   INR 1.11 07/13/2016    Radiology [place] Paracentesis  Result Date: 03/07/2017 INDICATION: 66 year old with recurrent ascites. History of end-stage renal disease. EXAM: ULTRASOUND GUIDED PARACENTESIS MEDICATIONS: None. COMPLICATIONS: None immediate. PROCEDURE: Informed written consent was obtained from the patient after a discussion of the risks, benefits and alternatives to treatment. A timeout was performed prior to the initiation of the procedure. Initial ultrasound scanning demonstrates a large amount of ascites within the left lower abdominal quadrant. The left lower abdomen was prepped and draped in the usual sterile fashion. 1% lidocaine was used for local anesthesia. Following this, a 6 Fr Safe-T-Centesis catheter was introduced. An ultrasound image was saved for documentation purposes. The catheter stopped draining after approximately 1 L. Unable to drain additional fluid from this catheter and the catheter was removed. Therefore, the area  was prepped again. Skin was again anesthetized with 1% lidocaine. A new Safe-T-Centesis catheter was placed. The paracentesis was performed. The catheter was removed and a dressing was applied. The patient tolerated the procedure well without immediate post procedural complication. FINDINGS: A total of approximately 5 L of yellow fluid was removed. IMPRESSION: Successful ultrasound-guided paracentesis yielding 5 liters of peritoneal fluid. Electronically Signed   By: Richarda OverlieAdam  Henn M.D.   On: 03/07/2017 11:25   [place] Paracentesis  Result Date: 02/28/2017 INDICATION: History of end-stage renal disease, polycystic kidney/liver disease, coronary artery disease/CHF, and recurrent ascites. Request made for therapeutic paracentesis. EXAM: ULTRASOUND GUIDED THERAPEUTIC PARACENTESIS MEDICATIONS: 15 mL 1% lidocaine COMPLICATIONS: None immediate. PROCEDURE: Informed written consent was obtained from the patient after a discussion of the risks, benefits and alternatives to treatment. A timeout was performed prior to the initiation of the procedure. Initial ultrasound scanning demonstrates a large amount of ascites within the left lateral abdomen. The left lateral abdomen was prepped and draped in the usual sterile fashion. 1% lidocaine was used for local anesthesia. Following this, a 19 gauge, 7-cm, Yueh catheter was introduced. An ultrasound image was saved for documentation purposes. The paracentesis was performed. The catheter was removed and a dressing was applied. The patient tolerated the procedure well without immediate post procedural complication. FINDINGS: A total of approximately 5.0 liters of yellow fluid was removed. IMPRESSION: Successful ultrasound-guided therapeutic paracentesis yielding 5.0 liters of peritoneal fluid. Read by:  Loyce DysKacie Matthews PA-C Electronically Signed   By: Richarda OverlieAdam  Henn M.D.   On: 02/28/2017 11:03   [place] Paracentesis  Result Date: 02/21/2017 INDICATION: 66 year old with recurrent ascites and  end-stage renal disease. EXAM: ULTRASOUND GUIDED THERAPEUTIC PARACENTESIS MEDICATIONS: None. COMPLICATIONS: None immediate. PROCEDURE: Informed written consent was obtained from the patient after a discussion of the risks, benefits and alternatives to treatment. A timeout was performed prior to the initiation of the procedure. Initial ultrasound scanning demonstrates a large amount of ascites within the right lower abdominal quadrant. The right lower abdomen was prepped and draped in the usual sterile fashion. 1% lidocaine was used for local anesthesia. Following this, a 6 Fr Safe-T-Centesis catheter was introduced. An ultrasound image was saved for documentation purposes. The paracentesis was performed. The catheter was removed and a dressing was applied. The patient tolerated the procedure well without immediate post procedural complication. FINDINGS: A total of approximately  5 L of yellow fluid was removed. IMPRESSION: Successful ultrasound-guided paracentesis yielding 5 liters of peritoneal fluid. Electronically Signed   By: Richarda Overlie M.D.   On: 02/21/2017 11:31   US Paracentesis  Result Date: 02/14/2017 INDICATION: History of end-stage renal disease, polycystic kidney/liver disease, coronary artery disease/CHF, recurrent ascites. Request made for therapeutic paracentesis. EXAM: ULTRASOUND GUIDED THERAPEUTIC PARACENTESIS MEDICATIONS: None. COMPLICATIONS: None immediate. PROCEDURE: Informed written consent was obtained from the patient after a discussion of the risks, benefits and alternatives to treatment. A timeout was performed prior to the initiation of the procedure. Initial ultrasound scanning demonstrates a large amount of ascites within the left mid to lower abdominal quadrant. The left mid to lower abdomen was prepped and draped in the usual sterile fashion. 1% lidocaine was used for local anesthesia. Following this, a Safe-T-Centesis catheter was introduced. An ultrasound image was saved for  documentation purposes. The paracentesis was performed. The catheter was removed and a dressing was applied. The patient tolerated the procedure well without immediate post procedural complication. FINDINGS: A total of approximately 5 liters of yellow fluid was removed. IMPRESSION: Successful ultrasound-guided therapeutic paracentesis yielding 5 liters of peritoneal fluid. Read by: Jeananne Rama, PA-C Electronically Signed   By: Richarda Overlie M.D.   On: 02/14/2017 10:08    Assessment/Plan 1. End stage renal disease on dialysis [facility]) Recommend:  The patient has advanced renal disease but currently does not have dialysis access.  The patient has elected to move forward with peritoneal dialysis. There is no history of prior abdominal operations, thus this does not appear at this time to be a prohibitive situation.  This was also discussed.  Risk and benefits were reviewed the patient.  Indications for the procedure were reviewed.  All questions were answered, the patient agrees to proceed with laparoscopic PD catheter insertion.  I have personally spoken with both Dr Cherylann Ratel and Dr Servando Snare regarding the use of PD in a patient with ascites.  Dr Cherylann Ratel feels that her ascites can be controlled and hypoalbuminemia can avoided.  Dr Servando Snare is concerned that repetitive episodes of peritonitis could cause loculations and prevent further paracentesis but a TIPs procedure could be utilized     A total of 65 minutes was spent with this patient and her daughter and greater than 50% was spent in counseling and coordination of care with the patient.  Discussion included the treatment options for dialysis access and perioperative management.    2. Complication of vascular access for dialysis, sequela See #1  3. Aneurysm of vertebral artery (HCC) She remains asymptomatic.  Followed at an outside institution  4. Essential (primary) hypertension Continue antihypertensive medications as already ordered, these  medications have been reviewed and there are no changes at this time.     Levora Dredge, MD  03/11/2017 4:57 PM

## 2017-03-13 DIAGNOSIS — Z992 Dependence on renal dialysis: Secondary | ICD-10-CM | POA: Diagnosis not present

## 2017-03-13 DIAGNOSIS — N186 End stage renal disease: Secondary | ICD-10-CM | POA: Diagnosis not present

## 2017-03-13 DIAGNOSIS — Z23 Encounter for immunization: Secondary | ICD-10-CM | POA: Diagnosis not present

## 2017-03-14 ENCOUNTER — Ambulatory Visit
Admission: RE | Admit: 2017-03-14 | Discharge: 2017-03-14 | Disposition: A | Discharge status: Home / Self Care | Payer: Medicare Other | Source: Ambulatory Visit | Attending: Nephrology | Admitting: Nephrology

## 2017-03-14 DIAGNOSIS — R188 Other ascites: Secondary | ICD-10-CM | POA: Insufficient documentation

## 2017-03-14 NOTE — Procedures (Signed)
PROCEDURE SUMMARY:  Successful US guided paracentesis from RLQ.  Yielded 5 L of clear yellow fluid.  No immediate complications.  Pt tolerated well.   Specimen was not sent for labs.  Brayton ElBRUNING, Brynlynn Walko PA-C 03/14/2017 11:01 AM

## 2017-03-16 ENCOUNTER — Ambulatory Visit (INDEPENDENT_AMBULATORY_CARE_PROVIDER_SITE_OTHER): Payer: Medicare Other | Admitting: Vascular Surgery

## 2017-03-16 DIAGNOSIS — N186 End stage renal disease: Secondary | ICD-10-CM | POA: Diagnosis not present

## 2017-03-16 DIAGNOSIS — Z23 Encounter for immunization: Secondary | ICD-10-CM | POA: Diagnosis not present

## 2017-03-16 DIAGNOSIS — Z992 Dependence on renal dialysis: Secondary | ICD-10-CM | POA: Diagnosis not present

## 2017-03-17 DIAGNOSIS — N186 End stage renal disease: Secondary | ICD-10-CM | POA: Diagnosis not present

## 2017-03-17 DIAGNOSIS — Z992 Dependence on renal dialysis: Secondary | ICD-10-CM | POA: Diagnosis not present

## 2017-03-17 DIAGNOSIS — Z23 Encounter for immunization: Secondary | ICD-10-CM | POA: Diagnosis not present

## 2017-03-20 DIAGNOSIS — N186 End stage renal disease: Secondary | ICD-10-CM | POA: Diagnosis not present

## 2017-03-20 DIAGNOSIS — Z992 Dependence on renal dialysis: Secondary | ICD-10-CM | POA: Diagnosis not present

## 2017-03-20 DIAGNOSIS — Z23 Encounter for immunization: Secondary | ICD-10-CM | POA: Diagnosis not present

## 2017-03-21 ENCOUNTER — Ambulatory Visit
Admission: RE | Admit: 2017-03-21 | Discharge: 2017-03-21 | Disposition: A | Discharge status: Home / Self Care | Payer: Medicare Other | Source: Ambulatory Visit | Attending: Nephrology | Admitting: Nephrology

## 2017-03-21 DIAGNOSIS — R188 Other ascites: Secondary | ICD-10-CM | POA: Diagnosis not present

## 2017-03-22 DIAGNOSIS — Z23 Encounter for immunization: Secondary | ICD-10-CM | POA: Diagnosis not present

## 2017-03-22 DIAGNOSIS — Z992 Dependence on renal dialysis: Secondary | ICD-10-CM | POA: Diagnosis not present

## 2017-03-22 DIAGNOSIS — N186 End stage renal disease: Secondary | ICD-10-CM | POA: Diagnosis not present

## 2017-03-24 DIAGNOSIS — Z23 Encounter for immunization: Secondary | ICD-10-CM | POA: Diagnosis not present

## 2017-03-24 DIAGNOSIS — N186 End stage renal disease: Secondary | ICD-10-CM | POA: Diagnosis not present

## 2017-03-24 DIAGNOSIS — Z992 Dependence on renal dialysis: Secondary | ICD-10-CM | POA: Diagnosis not present

## 2017-03-27 ENCOUNTER — Encounter (INDEPENDENT_AMBULATORY_CARE_PROVIDER_SITE_OTHER): Payer: Self-pay | Admitting: Vascular Surgery

## 2017-03-27 ENCOUNTER — Ambulatory Visit (INDEPENDENT_AMBULATORY_CARE_PROVIDER_SITE_OTHER): Payer: Medicare Other | Admitting: Vascular Surgery

## 2017-03-27 ENCOUNTER — Ambulatory Visit (INDEPENDENT_AMBULATORY_CARE_PROVIDER_SITE_OTHER): Payer: Medicare Other

## 2017-03-27 VITALS — BP 155/87 | HR 79 | Resp 16 | Wt 140.0 lb

## 2017-03-27 DIAGNOSIS — T829XXS Unspecified complication of cardiac and vascular prosthetic device, implant and graft, sequela: Secondary | ICD-10-CM | POA: Diagnosis not present

## 2017-03-27 DIAGNOSIS — N186 End stage renal disease: Secondary | ICD-10-CM

## 2017-03-27 DIAGNOSIS — Z23 Encounter for immunization: Secondary | ICD-10-CM | POA: Diagnosis not present

## 2017-03-27 DIAGNOSIS — Z992 Dependence on renal dialysis: Secondary | ICD-10-CM | POA: Diagnosis not present

## 2017-03-27 DIAGNOSIS — R188 Other ascites: Secondary | ICD-10-CM

## 2017-03-28 ENCOUNTER — Ambulatory Visit
Admission: RE | Admit: 2017-03-28 | Discharge: 2017-03-28 | Disposition: A | Discharge status: Home / Self Care | Payer: Medicare Other | Source: Ambulatory Visit | Attending: Nephrology | Admitting: Nephrology

## 2017-03-28 DIAGNOSIS — R188 Other ascites: Secondary | ICD-10-CM | POA: Insufficient documentation

## 2017-03-28 NOTE — Procedures (Signed)
PROCEDURE SUMMARY:  Successful US guided paracentesis from right lateral abdomen.  Yielded 5.0 liters of yellow fluid.  No immediate complications.  Pt tolerated well.   Specimen was not sent for labs.  Hoyt Koch PA-C 03/28/2017 10:17 AM

## 2017-03-29 ENCOUNTER — Other Ambulatory Visit (INDEPENDENT_AMBULATORY_CARE_PROVIDER_SITE_OTHER): Payer: Self-pay | Admitting: Vascular Surgery

## 2017-03-29 DIAGNOSIS — R188 Other ascites: Secondary | ICD-10-CM

## 2017-03-29 DIAGNOSIS — N186 End stage renal disease: Secondary | ICD-10-CM | POA: Diagnosis not present

## 2017-03-29 DIAGNOSIS — Z23 Encounter for immunization: Secondary | ICD-10-CM | POA: Diagnosis not present

## 2017-03-29 DIAGNOSIS — Z992 Dependence on renal dialysis: Secondary | ICD-10-CM | POA: Diagnosis not present

## 2017-03-29 NOTE — Progress Notes (Signed)
MRN : [id number]  Melissa Bradshaw is a 66 y.o. (1950/07/25) female who presents with chief complaint of  Chief Complaint  Patient presents with  . Follow-up    6 month HDA  .  History of Present Illness: The patient returns to the office for followup of their dialysis access. The function of the access has been stable. The patient denies increased bleeding time or increased recirculation. Patient denies difficulty with cannulation. The patient denies hand pain or other symptoms consistent with steal phenomena.  No significant arm swelling.  The patient denies redness or swelling at the access site. The patient denies fever or chills at home or while on dialysis.  The patient denies amaurosis fugax or recent TIA symptoms. There are no recent neurological changes noted. The patient denies claudication symptoms or rest pain symptoms. The patient denies history of DVT, PE or superficial thrombophlebitis. The patient denies recent episodes of angina or shortness of breath.    Duplex ultrasound of the AV access shows a patent access with uniform velocities.  No focal hemodynamically significant stenosis.      Current Meds  Medication Sig  . Amino Acid Infusion (PROSOL) 20 % SOLN   . amLODipine (NORVASC) 10 MG tablet Take 10 mg by mouth daily.  Marland Kitchen aspirin 81 MG tablet Take 81 mg by mouth daily.  . calcium acetate (PHOSLO) 667 MG capsule Take 1,334 mg by mouth 3 (three) times daily with meals.  . furosemide (LASIX) 20 MG tablet Take 20 mg by mouth daily.   Marland Kitchen labetalol (NORMODYNE) 200 MG tablet Take 1 tablet (200 mg total) by mouth every morning. (Patient taking differently: Take 200 mg by mouth 2 (two) times daily. )  . latanoprost (XALATAN) 0.005 % ophthalmic solution Place 1 drop into both eyes at bedtime.  . lidocaine-prilocaine (EMLA) cream    Current Facility-Administered Medications for the 03/27/17 encounter (Office Visit) with Gilda Crease, Latina Craver, MD  Medication  . ceFAZolin  (ANCEF) IVPB 1 g/50 mL premix    Past Medical History:  Diagnosis Date  . Anemia   . Aneurysm (HCC)    Cerebral, [place], [place]  . Arthritis   . Cardiac pacemaker   . CHF (congestive heart failure) (HCC)   . Chronic kidney disease    ESRD  . Constipation   . Coronary artery disease   . Dialysis patient (HCC)    Tues, Thurs, Sat  . Gastric ulcer   . Glaucoma (increased eye pressure)   . Headache   . Hypertension   . Presence of permanent cardiac pacemaker     Past Surgical History:  Procedure Laterality Date  . ANEURYSM COILING    . AV FISTULA PLACEMENT Left 07/20/2016   Procedure: ARTERIOVENOUS (AV) FISTULA CREATION;  Surgeon: Renford Dills, MD;  Location: [facility] ORS;  Service: Vascular;  Laterality: Left;  . AV FISTULA PLACEMENT Left 08/26/2016   Procedure: INSERTION OF ARTERIOVENOUS (AV) GORE-TEX GRAFT ARM;  Surgeon: Renford Dills, MD;  Location: [facility] ORS;  Service: Vascular;  Laterality: Left;  . COLONOSCOPY     2013  . DIALYSIS/PERMA CATHETER REMOVAL N/A 10/11/2016   Procedure: Dialysis/Perma Catheter Removal;  Surgeon: Renford Dills, MD;  Location: [facility] INVASIVE CV [facility];  Service: Cardiovascular;  Laterality: N/A;  . INSERT / REPLACE / REMOVE PACEMAKER    . PACEMAKER PLACEMENT    . PERIPHERAL VASCULAR CATHETERIZATION N/A 06/02/2016   Procedure: Dialysis/Perma Catheter Insertion;  Surgeon: Annice Needy, MD;  Location:  [facility] INV[facility]IVE CV [facility];  Service: Cardiovascular;  Laterality: N/A;    Social History Social History  Substance Use Topics  . Smoking status: Current Every Day Smoker    Packs/day: 0.25    Years: 41.00    Types: Cigarettes  . Smokeless tobacco: Never Used  . Alcohol use No    Family History Family History  Problem Relation Age of Onset  . Hypertension Mother   . Diabetes Father   . Cancer Sister   . Stroke Sister   . Hypertension Sister     Allergies  Allergen Reactions  . Hydralazine Hcl Other (See Comments)    Makes pt  jittery, nervous, messes with vision   . Iodine Itching and Rash    Unknown reaction  . Ivp Dye [Iodinated Diagnostic Agents] Rash     REVIEW OF SYSTEMS (Negative unless checked)  Constitutional: Weight loss  Fever  Chills Cardiac: Chest pain   Chest pressure   Palpitations   Shortness of breath when laying flat   Shortness of breath with exertion. Vascular:  Pain in legs with walking   Pain in legs at rest  History of DVT   Phlebitis   Swelling in legs   Varicose veins   Non-healing ulcers Pulmonary:   Uses home oxygen   Productive cough   Hemoptysis   Wheeze  COPD   Asthma Neurologic:  Dizziness   Seizures   History of stroke   History of TIA  Aphasia   Vissual changes   Weakness or numbness in arm   Weakness or numbness in leg Musculoskeletal:   Joint swelling   Joint pain   Low back pain Hematologic:  Easy bruising  Easy bleeding   Hypercoagulable state   Anemic Gastrointestinal:  Diarrhea   Vomiting  Gastroesophageal reflux/heartburn   Difficulty swallowing. Genitourinary:  Chronic kidney disease   Difficult urination  Frequent urination   Blood in urine Skin:  Rashes   Ulcers  Psychological:  History of anxiety    History of major depression.  Physical Examination  Vitals:   03/27/17 1526  BP: (!) 155/87  Pulse: 79  Resp: 16  Weight: 63.5 kg (140 lb)   Body mass index is 22.94 kg/m. Gen: WD/WN, NAD Head: [place]/AT, No temporalis wasting.  Ear/Nose/Throat: Hearing grossly intact, nares w/o erythema or drainage Eyes: PER, EOMI, sclera nonicteric.  Neck: Supple, no large masses.   Pulmonary:  Good air movement, no audible wheezing bilaterally, no use of accessory muscles.  Cardiac: RRR, no JVD Vascular: left brachial axillary dialysis graft good thrill good bruit skin appears healthy and intact over the graft  Vessel Right Left  Radial Palpable Palpable  Ulnar Palpable Palpable    Brachial Palpable Palpable  Carotid Palpable Palpable  Gastrointestinal:  Markedly distended. + umbilical hernia No guarding/no peritoneal signs.  Musculoskeletal: M/S 5/5 throughout.  No deformity or atrophy.  Neurologic: CN 2-12 intact. Symmetrical.  Speech is fluent. Motor exam as listed above. Psychiatric: Judgment intact, Mood & affect appropriate for pt's clinical situation. Dermatologic: No rashes or ulcers noted.  No changes consistent with cellulitis. Lymph : No lichenification or skin changes of chronic lymphedema.  CBC Lab Results  Component Value Date   WBC 5.0 11/23/2016   HGB 10.5 (L) 11/23/2016   HCT 30.8 (L) 11/23/2016   MCV 84.5 11/23/2016   PLT 238 11/23/2016    BMET    Component Value Date/Time   NA 141 11/23/2016 1128   NA 141 01/30/2014 1505  K 2.8 (L) 11/23/2016 1128   K 5.0 01/30/2014 1505   CL 100 (L) 11/23/2016 1128   CL 116 (H) 01/30/2014 1505   CO2 33 (H) 11/23/2016 1128   CO2 17 (L) 01/30/2014 1505   GLUCOSE 93 11/23/2016 1128   GLUCOSE 93 01/30/2014 1505   BUN 20 11/23/2016 1128   BUN 50 (H) 06/19/2014 1413   CREATININE 2.77 (H) 11/23/2016 1128   CREATININE 3.33 (H) 06/19/2014 1413   CALCIUM 7.7 (L) 11/23/2016 1128   CALCIUM 8.8 01/30/2014 1505   GFRNONAA 17 (L) 11/23/2016 1128   GFRNONAA 15 (L) 06/19/2014 1413   GFRNONAA 14 (L) 01/30/2014 1505   GFRAA 19 (L) 11/23/2016 1128   GFRAA 18 (L) 06/19/2014 1413   GFRAA 16 (L) 01/30/2014 1505   CrCl cannot be calculated (Patient's most recent lab result is older than the maximum 21 days allowed.).  COAG Lab Results  Component Value Date   INR 1.11 07/13/2016    Radiology US Paracentesis  Result Date: 03/28/2017 INDICATION: Patient with past medical history of cirrhosis, recurrent ascites. Request is made for therapeutic paracentesis of 5 liters maximum. EXAM: ULTRASOUND GUIDED THERAPEUTIC PARACENTESIS MEDICATIONS: 10 mL 1% lidocaine COMPLICATIONS: None immediate. PROCEDURE: Informed  written consent was obtained from the patient after a discussion of the risks, benefits and alternatives to treatment. A timeout was performed prior to the initiation of the procedure. Initial ultrasound scanning demonstrates a large amount of ascites within the right lateral abdomen. The right lateral abdomen was prepped and draped in the usual sterile fashion. 1% lidocaine was used for local anesthesia. Following this, a Safe-T-Centesis catheter was introduced. An ultrasound image was saved for documentation purposes. The paracentesis was performed. The catheter was removed and a dressing was applied. The patient tolerated the procedure well without immediate post procedural complication. FINDINGS: A total of approximately 5.0 liters of straw-colored fluid was removed. IMPRESSION: Successful ultrasound-guided therapeutic paracentesis yielding 5.0 liters of peritoneal fluid. Read by:  Loyce Dys PA-C Electronically Signed   By: Alcide Clever M.D.   On: 03/28/2017 10:20   US Paracentesis  Result Date: 03/21/2017 INDICATION: End-stage renal disease on hemodialysis, coronary artery disease. CHF. Recurrent ascites. Request for therapeutic paracentesis up to 5 liter maximum. EXAM: ULTRASOUND GUIDED RIGHT LATERAL ABDOMEN PARACENTESIS MEDICATIONS: 1% Lidocaine = 10 mL COMPLICATIONS: None immediate. PROCEDURE: Informed written consent was obtained from the patient after a discussion of the risks, benefits and alternatives to treatment. A timeout was performed prior to the initiation of the procedure. Initial ultrasound scanning demonstrates a large amount of ascites within the right lateral abdomen. The right lateral abdomen was prepped and draped in the usual sterile fashion. 1% lidocaine with epinephrine was used for local anesthesia. Following this, a 6 Fr Safe-T-Centesis catheter was introduced. An ultrasound image was saved for documentation purposes. The paracentesis was performed. The catheter was removed and  a dressing was applied. The patient tolerated the procedure well without immediate post procedural complication. FINDINGS: A total of approximately 5 liters of clear yellow fluid was removed. IMPRESSION: Successful ultrasound-guided paracentesis yielding 5 liters of peritoneal fluid. Read by:  Corrin Parker, PA-C Electronically Signed   By: Alcide Clever M.D.   On: 03/21/2017 11:55   US Paracentesis  Result Date: 03/14/2017 INDICATION: Abdominal distention secondary recurrent ascites. Request therapeutic paracentesis of up to 5 L max. EXAM: ULTRASOUND GUIDED RIGHT LOWER QUADRANT PARACENTESIS MEDICATIONS: None. COMPLICATIONS: None immediate. PROCEDURE: Informed written consent was obtained from the patient after a discussion  of the risks, benefits and alternatives to treatment. A timeout was performed prior to the initiation of the procedure. Initial ultrasound scanning demonstrates a large amount of ascites within the right lower abdominal quadrant. The right lower abdomen was prepped and draped in the usual sterile fashion. 1% lidocaine with epinephrine was used for local anesthesia. Following this, a Safe-T-Centesis catheter was introduced. An ultrasound image was saved for documentation purposes. The paracentesis was performed. The catheter was removed and a dressing was applied. The patient tolerated the procedure well without immediate post procedural complication. FINDINGS: A total of approximately 5 L of clear yellow fluid was removed. IMPRESSION: Successful ultrasound-guided paracentesis yielding 5 liters of peritoneal fluid. Read by: Brayton El PA-C Electronically Signed   By: Irish Lack M.D.   On: 03/14/2017 11:12   US Paracentesis  Result Date: 03/07/2017 INDICATION: 66 year old with recurrent ascites. History of end-stage renal disease. EXAM: ULTRASOUND GUIDED PARACENTESIS MEDICATIONS: None. COMPLICATIONS: None immediate. PROCEDURE: Informed written consent was obtained from the patient after  a discussion of the risks, benefits and alternatives to treatment. A timeout was performed prior to the initiation of the procedure. Initial ultrasound scanning demonstrates a large amount of ascites within the left lower abdominal quadrant. The left lower abdomen was prepped and draped in the usual sterile fashion. 1% lidocaine was used for local anesthesia. Following this, a 6 Fr Safe-T-Centesis catheter was introduced. An ultrasound image was saved for documentation purposes. The catheter stopped draining after approximately 1 L. Unable to drain additional fluid from this catheter and the catheter was removed. Therefore, the area was prepped again. Skin was again anesthetized with 1% lidocaine. A new Safe-T-Centesis catheter was placed. The paracentesis was performed. The catheter was removed and a dressing was applied. The patient tolerated the procedure well without immediate post procedural complication. FINDINGS: A total of approximately 5 L of yellow fluid was removed. IMPRESSION: Successful ultrasound-guided paracentesis yielding 5 liters of peritoneal fluid. Electronically Signed   By: Richarda Overlie M.D.   On: 03/07/2017 11:25   US Paracentesis  Result Date: 02/28/2017 INDICATION: History of end-stage renal disease, polycystic kidney/liver disease, coronary artery disease/CHF, and recurrent ascites. Request made for therapeutic paracentesis. EXAM: ULTRASOUND GUIDED THERAPEUTIC PARACENTESIS MEDICATIONS: 15 mL 1% lidocaine COMPLICATIONS: None immediate. PROCEDURE: Informed written consent was obtained from the patient after a discussion of the risks, benefits and alternatives to treatment. A timeout was performed prior to the initiation of the procedure. Initial ultrasound scanning demonstrates a large amount of ascites within the left lateral abdomen. The left lateral abdomen was prepped and draped in the usual sterile fashion. 1% lidocaine was used for local anesthesia. Following this, a 19 gauge, 7-cm,  Yueh catheter was introduced. An ultrasound image was saved for documentation purposes. The paracentesis was performed. The catheter was removed and a dressing was applied. The patient tolerated the procedure well without immediate post procedural complication. FINDINGS: A total of approximately 5.0 liters of yellow fluid was removed. IMPRESSION: Successful ultrasound-guided therapeutic paracentesis yielding 5.0 liters of peritoneal fluid. Read by:  Loyce Dys PA-C Electronically Signed   By: Richarda Overlie M.D.   On: 02/28/2017 11:03    Assessment/Plan 1. End stage renal disease (HCC) Recommend:  The patient is doing well and currently has adequate dialysis access. The patient's dialysis center is not reporting any access issues. Flow pattern is stable when compared to the prior ultrasound.  The patient should have a duplex ultrasound of the dialysis access in 6 months.  The  patient will follow-up with me in the office after each ultrasound   2. Complication of vascular access for dialysis, sequela I have discussed this case with both Dr. Daleen Squibb and Dr. Cherylann Ratel. Dr. Daleen Squibb has concerns regarding placement of the peritoneal dialysis catheter and would like her evaluated for possible TIPS procedure. I'll make a referral to Dr. Fredia Sorrow. Once she has seen Dr. Fredia Sorrow if he feels she is not a candidate for TIPS we will move forward with placement of a PD catheter. I have discussed with the patient the increased risk of infection given the fluid will likely leak around the catheter and the increased risk of lack of incorporation of the catheter secondary to the ascites fluid.  3. Other ascites See #2 - Ambulatory referral to Interventional Radiology    Levora Dredge, MD  03/29/2017 9:22 AM

## 2017-04-02 DIAGNOSIS — N186 End stage renal disease: Secondary | ICD-10-CM | POA: Diagnosis not present

## 2017-04-02 DIAGNOSIS — Z992 Dependence on renal dialysis: Secondary | ICD-10-CM | POA: Diagnosis not present

## 2017-04-03 DIAGNOSIS — N186 End stage renal disease: Secondary | ICD-10-CM | POA: Diagnosis not present

## 2017-04-03 DIAGNOSIS — Z992 Dependence on renal dialysis: Secondary | ICD-10-CM | POA: Diagnosis not present

## 2017-04-04 ENCOUNTER — Ambulatory Visit
Admission: RE | Admit: 2017-04-04 | Discharge: 2017-04-04 | Disposition: A | Discharge status: Home / Self Care | Payer: Medicare Other | Source: Ambulatory Visit | Attending: Nephrology | Admitting: Nephrology

## 2017-04-04 DIAGNOSIS — N186 End stage renal disease: Secondary | ICD-10-CM | POA: Diagnosis not present

## 2017-04-04 DIAGNOSIS — Z992 Dependence on renal dialysis: Secondary | ICD-10-CM | POA: Diagnosis not present

## 2017-04-04 DIAGNOSIS — R188 Other ascites: Secondary | ICD-10-CM | POA: Diagnosis not present

## 2017-04-04 NOTE — Procedures (Signed)
Ultrasound-guided therapeutic paracentesis performed yielding 5 liters of serous colored fluid. No immediate complications.  Jenefer Woerner E 10:59 AM 04/04/2017

## 2017-04-05 DIAGNOSIS — N186 End stage renal disease: Secondary | ICD-10-CM | POA: Diagnosis not present

## 2017-04-05 DIAGNOSIS — Z992 Dependence on renal dialysis: Secondary | ICD-10-CM | POA: Diagnosis not present

## 2017-04-07 DIAGNOSIS — N186 End stage renal disease: Secondary | ICD-10-CM | POA: Diagnosis not present

## 2017-04-07 DIAGNOSIS — Z992 Dependence on renal dialysis: Secondary | ICD-10-CM | POA: Diagnosis not present

## 2017-04-10 DIAGNOSIS — E119 Type 2 diabetes mellitus without complications: Secondary | ICD-10-CM | POA: Diagnosis not present

## 2017-04-10 DIAGNOSIS — Z992 Dependence on renal dialysis: Secondary | ICD-10-CM | POA: Diagnosis not present

## 2017-04-10 DIAGNOSIS — N186 End stage renal disease: Secondary | ICD-10-CM | POA: Diagnosis not present

## 2017-04-11 ENCOUNTER — Ambulatory Visit
Admission: RE | Admit: 2017-04-11 | Discharge: 2017-04-11 | Disposition: A | Discharge status: Home / Self Care | Payer: Medicare Other | Source: Ambulatory Visit | Attending: Nephrology | Admitting: Nephrology

## 2017-04-11 DIAGNOSIS — R188 Other ascites: Secondary | ICD-10-CM | POA: Diagnosis not present

## 2017-04-11 NOTE — Procedures (Signed)
Ultrasound-guided  therapeutic paracentesis performed yielding 5 liters (maximum ordered) of yellow fluid. No immediate complications.  

## 2017-04-12 DIAGNOSIS — Z992 Dependence on renal dialysis: Secondary | ICD-10-CM | POA: Diagnosis not present

## 2017-04-12 DIAGNOSIS — N186 End stage renal disease: Secondary | ICD-10-CM | POA: Diagnosis not present

## 2017-04-13 ENCOUNTER — Other Ambulatory Visit: Payer: Medicare Other

## 2017-04-17 DIAGNOSIS — Z992 Dependence on renal dialysis: Secondary | ICD-10-CM | POA: Diagnosis not present

## 2017-04-17 DIAGNOSIS — N186 End stage renal disease: Secondary | ICD-10-CM | POA: Diagnosis not present

## 2017-04-18 ENCOUNTER — Ambulatory Visit
Admission: RE | Admit: 2017-04-18 | Discharge: 2017-04-18 | Disposition: A | Discharge status: Home / Self Care | Payer: Medicare Other | Source: Ambulatory Visit | Attending: Nephrology | Admitting: Nephrology

## 2017-04-18 DIAGNOSIS — R188 Other ascites: Secondary | ICD-10-CM | POA: Diagnosis not present

## 2017-04-18 NOTE — Procedures (Signed)
PROCEDURE SUMMARY:  Successful US guided paracentesis from right lateral abdomen.  Yielded 5.0 liters of clear, yellow fluid.  No immediate complications.  Pt tolerated well.   Specimen was not sent for labs.  Hoyt Koch PA-C 04/18/2017 1:10 PM

## 2017-04-21 ENCOUNTER — Other Ambulatory Visit: Payer: Self-pay | Admitting: Nephrology

## 2017-04-21 DIAGNOSIS — R188 Other ascites: Secondary | ICD-10-CM

## 2017-04-24 ENCOUNTER — Encounter: Payer: Self-pay | Admitting: Emergency Medicine

## 2017-04-24 ENCOUNTER — Emergency Department
Admission: EM | Admit: 2017-04-24 | Discharge: 2017-04-24 | Disposition: A | Discharge status: Home / Self Care | Payer: Medicare Other | Attending: Emergency Medicine | Admitting: Emergency Medicine

## 2017-04-24 DIAGNOSIS — F17210 Nicotine dependence, cigarettes, uncomplicated: Secondary | ICD-10-CM | POA: Insufficient documentation

## 2017-04-24 DIAGNOSIS — Z950 Presence of cardiac pacemaker: Secondary | ICD-10-CM | POA: Insufficient documentation

## 2017-04-24 DIAGNOSIS — Z992 Dependence on renal dialysis: Secondary | ICD-10-CM | POA: Diagnosis not present

## 2017-04-24 DIAGNOSIS — R8271 Bacteriuria: Secondary | ICD-10-CM | POA: Insufficient documentation

## 2017-04-24 DIAGNOSIS — D649 Anemia, unspecified: Secondary | ICD-10-CM | POA: Insufficient documentation

## 2017-04-24 DIAGNOSIS — I10 Essential (primary) hypertension: Secondary | ICD-10-CM | POA: Diagnosis not present

## 2017-04-24 DIAGNOSIS — I2510 Atherosclerotic heart disease of native coronary artery without angina pectoris: Secondary | ICD-10-CM | POA: Diagnosis not present

## 2017-04-24 DIAGNOSIS — I132 Hypertensive heart and chronic kidney disease with heart failure and with stage 5 chronic kidney disease, or end stage renal disease: Secondary | ICD-10-CM | POA: Diagnosis not present

## 2017-04-24 DIAGNOSIS — I509 Heart failure, unspecified: Secondary | ICD-10-CM | POA: Diagnosis not present

## 2017-04-24 DIAGNOSIS — R197 Diarrhea, unspecified: Secondary | ICD-10-CM | POA: Diagnosis not present

## 2017-04-24 DIAGNOSIS — E860 Dehydration: Secondary | ICD-10-CM | POA: Diagnosis not present

## 2017-04-24 DIAGNOSIS — N186 End stage renal disease: Secondary | ICD-10-CM | POA: Insufficient documentation

## 2017-04-24 LAB — CBC
HEMATOCRIT: 40.8 % (ref 35.0–47.0)
Hemoglobin: 13.7 g/dL (ref 12.0–16.0)
MCH: 28.2 pg (ref 26.0–34.0)
MCHC: 33.5 g/dL (ref 32.0–36.0)
MCV: 84 fL (ref 80.0–100.0)
Platelets: 332 10*3/uL (ref 150–440)
RBC: 4.86 MIL/uL (ref 3.80–5.20)
RDW: 17.5 % — ABNORMAL HIGH (ref 11.5–14.5)
WBC: 5.4 10*3/uL (ref 3.6–11.0)

## 2017-04-24 LAB — URINALYSIS, COMPLETE (UACMP) WITH MICROSCOPIC
BILIRUBIN URINE: NEGATIVE
Glucose, UA: NEGATIVE mg/dL
HGB URINE DIPSTICK: NEGATIVE
KETONES UR: NEGATIVE mg/dL
LEUKOCYTES UA: NEGATIVE
NITRITE: NEGATIVE
PH: 7 (ref 5.0–8.0)
Protein, ur: 100 mg/dL — AB
RBC / HPF: NONE SEEN RBC/hpf (ref 0–5)
SPECIFIC GRAVITY, URINE: 1.014 (ref 1.005–1.030)

## 2017-04-24 LAB — COMPREHENSIVE METABOLIC PANEL
ALT: 10 U/L — AB (ref 14–54)
AST: 17 U/L (ref 15–41)
Albumin: 2.4 g/dL — ABNORMAL LOW (ref 3.5–5.0)
Alkaline Phosphatase: 33 U/L — ABNORMAL LOW (ref 38–126)
Anion gap: 12 (ref 5–15)
BUN: 54 mg/dL — AB (ref 6–20)
CHLORIDE: 100 mmol/L — AB (ref 101–111)
CO2: 29 mmol/L (ref 22–32)
CREATININE: 9.35 mg/dL — AB (ref 0.44–1.00)
Calcium: 7.5 mg/dL — ABNORMAL LOW (ref 8.9–10.3)
GFR calc Af Amer: 4 mL/min — ABNORMAL LOW (ref >60)
GFR calc non Af Amer: 4 mL/min — ABNORMAL LOW (ref >60)
Glucose, Bld: 96 mg/dL (ref 65–99)
Potassium: 4.3 mmol/L (ref 3.5–5.1)
SODIUM: 141 mmol/L (ref 135–145)
Total Bilirubin: 0.7 mg/dL (ref 0.3–1.2)
Total Protein: 6.1 g/dL — ABNORMAL LOW (ref 6.5–8.1)

## 2017-04-24 LAB — LIPASE, BLOOD: LIPASE: 55 U/L — AB (ref 11–51)

## 2017-04-24 MED ORDER — CEPHALEXIN 500 MG PO CAPS
500.0000 mg | ORAL_CAPSULE | Freq: Once | ORAL | Status: AC
  Administered 2017-04-24: 500 mg via ORAL
  Filled 2017-04-24: qty 1

## 2017-04-24 MED ORDER — SODIUM CHLORIDE 0.9 % IV BOLUS (SEPSIS)
250.0000 mL | Freq: Once | INTRAVENOUS | Status: AC
  Administered 2017-04-24: 250 mL via INTRAVENOUS

## 2017-04-24 MED ORDER — FENTANYL CITRATE (PF) 100 MCG/2ML IJ SOLN
50.0000 ug | Freq: Once | INTRAMUSCULAR | Status: AC
  Administered 2017-04-24: 50 ug via INTRAVENOUS
  Filled 2017-04-24: qty 2

## 2017-04-24 MED ORDER — CEPHALEXIN 500 MG PO CAPS: 500.0000 mg | ORAL_CAPSULE | Freq: Four times a day (QID) | ORAL | 0 refills | Status: AC

## 2017-04-24 NOTE — ED Provider Notes (Signed)
[facility] Emergency Department Provider Note  ____________________________________________  Time seen: Approximately 11:22 AM  I have reviewed the triage vital signs and the nursing notes.   HISTORY  Chief Complaint Diarrhea    HPI Melissa Bradshaw is a 66 y.o. female with a history of ESRD, chronic ascites requiring recurrent paracentesis,presenting with diarrhea. The patient reports that for about a week, she has had 3-4 episodes of loose stool daily that is nonbloody and non-watery. She has not had any associated nausea or vomiting, abdominal pain, fever or chills. She has a chronically distended abdomen is scheduled for paracentesis tomorrow. She urinates 1-2 times daily, without any burning. She has been eating and drinking less than usual. She went to dialysis today, and completed the course, and was sent here to have her diarrheal illness evaluated. No recent antibiotic use.   Past Medical History:  Diagnosis Date  . Anemia   . Aneurysm (HCC)    Cerebral, [place], [place]  . Arthritis   . Cardiac pacemaker   . CHF (congestive heart failure) (HCC)   . Chronic kidney disease    ESRD  . Constipation   . Coronary artery disease   . Dialysis patient (HCC)    Tues, Thurs, Sat  . Gastric ulcer   . Glaucoma (increased eye pressure)   . Headache   . Hypertension   . Presence of permanent cardiac pacemaker     Patient Active Problem List   Diagnosis Date Noted  . Ascites 03/29/2017  . Polycystic liver disease 09/30/2016  . Pre-transplant evaluation for kidney transplant 09/30/2016  . Complication of vascular access for dialysis 08/15/2016  . Anemia 08/08/2016  . Protein-calorie malnutrition, severe 06/01/2016  . Fluid overload 05/31/2016  . Anemia, chronic renal failure 01/31/2016  . End stage renal disease (HCC) 01/01/2015  . Iron deficiency anemia 11/01/2014  . Essential (primary) hypertension 11/08/2012  . Artificial cardiac  pacemaker 11/08/2012  . Congenital cystic disease of kidney 11/08/2012  . Temporary cerebral vascular dysfunction 11/08/2012  . Tobacco abuse, in remission 11/08/2012  . 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 Abnormal presence of protein in urine 09/21/2011  . ADPKD (autosomal dominant polycystic kidney disease) 06/07/2011    Past Surgical History:  Procedure Laterality Date  . ANEURYSM COILING    . AV FISTULA PLACEMENT Left 07/20/2016   Procedure: ARTERIOVENOUS (AV) FISTULA CREATION;  Surgeon: Renford Dills, MD;  Location: [facility] ORS;  Service: Vascular;  Laterality: Left;  . AV FISTULA PLACEMENT Left 08/26/2016   Procedure: INSERTION OF ARTERIOVENOUS (AV) GORE-TEX GRAFT ARM;  Surgeon: Renford Dills, MD;  Location: [facility] ORS;  Service: Vascular;  Laterality: Left;  . COLONOSCOPY     2013  . DIALYSIS/PERMA CATHETER REMOVAL N/A 10/11/2016   Procedure: Dialysis/Perma Catheter Removal;  Surgeon: Renford Dills, MD;  Location: [facility] INVASIVE CV LAB;  Service: Cardiovascular;  Laterality: N/A;  . INSERT / REPLACE / REMOVE PACEMAKER    . PACEMAKER PLACEMENT    . PERIPHERAL VASCULAR CATHETERIZATION N/A 06/02/2016   Procedure: Dialysis/Perma Catheter Insertion;  Surgeon: Annice Needy, MD;  Location: [facility] INVASIVE CV LAB;  Service: Cardiovascular;  Laterality: N/A;    Current Outpatient Rx  . Order #: [id number] Class: Historical Med  . Order #: [id number] Class: Historical Med  . Order #: [id number] Class: Historical Med  . Order #: [id number] Class: Historical Med  . Order #: [id number] Class: Historical Med  . Order #: [id number] Class: Print  . Order #: [id number] Class: Historical Med  Allergies Hydralazine hcl; Iodine; and Ivp dye [iodinated diagnostic agents]  Family History  Problem Relation Age of Onset  . Hypertension Mother   . Diabetes Father   . Cancer Sister   . Stroke Sister   . Hypertension Sister     Social History Social History  Substance Use Topics  .  Smoking status: Current Every Day Smoker    Packs/day: 0.25    Years: 41.00    Types: Cigarettes  . Smokeless tobacco: Never Used  . Alcohol use No    Review of Systems Constitutional: No fever/chills.no lightheadedness or syncope. Positive general malaise. Positive anorexia. Eyes: No visual changes. ENT: No sore throat. No congestion or rhinorrhea. Cardiovascular: Denies chest pain. Denies palpitations. Respiratory: Denies shortness of breath.  No cough. Gastrointestinal: No abdominal painperiod, positive chronic abdominal distention.  No nausea, no vomiting.  ositive nonbloodydiarrhea.  No constipation. Genitourinary: Negative for dysuria. Musculoskeletal: Negative for back pain. Skin: Negative for rash. Neurological: Negative for headaches. No focal numbness, tingling or weakness.     ____________________________________________   PHYSICAL EXAM:  VITAL SIGNS: ED Triage Vitals  Enc Vitals Group     BP 04/24/17 0930 108/64     Pulse Rate 04/24/17 0930 81     Resp 04/24/17 0930 18     Temp 04/24/17 0930 97.7 F (36.5 C)     Temp Source 04/24/17 0930 Oral     SpO2 04/24/17 0930 95 %     Weight 04/24/17 0938 120 lb (54.4 kg)     Height 04/24/17 0938 5\' 5"  (1.651 m)     Head Circumference --      Peak Flow --      Pain Score 04/24/17 0936 7     Pain Loc --      Pain Edu? --      Excl. in GC? --     Constitutional: Alert and oriented. Chronically ill appearing but in no acute distress. Answers questions appropriately. Eyes: Conjunctivae are normal.  EOMI. No scleral icterus. Head: Atraumatic. Nose: No congestion/rhinnorhea. Mouth/Throat: Mucous membranes are moist.  Neck: No stridor.  Supple.  No JVD. No meningismus. Cardiovascular: Normal rate, regular rhythm. No murmurs, rubs or gallops.  Respiratory: Normal respiratory effort.  No accessory muscle use or retractions. Lungs CTAB.  No wheezes, rales or ronchi. Gastrointestinal: Soft, nontender. The patient's  abdomen is clinically significantly distended with fluid wave but no overlying tenderness. She has a significant umbilical hernia that is reducible on examination without pain.  No guarding or rebound.  No peritoneal signs. Musculoskeletal: No LE edema. No ttp in the calves or palpable cords.  Negative Homan's sign.left upper extremity fistula with palpable thrill. Neurologic:  A&Ox3.  Speech is clear.  Face and smile are symmetric.  EOMI.  Moves all extremities well. Skin:  Skin is warm, dry and intact. No rash noted. Psychiatric: he patient has a depressed mood and affect. ____________________________________________   LABS (all labs ordered are listed, but only abnormal results are displayed)  Labs Reviewed  LIPASE, BLOOD - Abnormal; Notable for the following:       Result Value   Lipase 55 (*)    All other components within normal limits  COMPREHENSIVE METABOLIC PANEL - Abnormal; Notable for the following:    Chloride 100 (*)    BUN 54 (*)    Creatinine, Ser 9.35 (*)    Calcium 7.5 (*)    Total Protein 6.1 (*)    Albumin 2.4 (*)  ALT 10 (*)    Alkaline Phosphatase 33 (*)    GFR calc non Af Amer 4 (*)    GFR calc Af Amer 4 (*)    All other components within normal limits  CBC - Abnormal; Notable for the following:    RDW 17.5 (*)    All other components within normal limits  URINALYSIS, COMPLETE (UACMP) WITH MICROSCOPIC - Abnormal; Notable for the following:    Color, Urine YELLOW (*)    APPearance CLEAR (*)    Protein, ur 100 (*)    Bacteria, UA RARE (*)    Squamous Epithelial / LPF 0-5 (*)    All other components within normal limits  C DIFFICILE QUICK SCREEN W PCR REFLEX  GASTROINTESTINAL PANEL BY PCR, STOOL (REPLACES STOOL CULTURE)   ____________________________________________  EKG  ED ECG REPORT I, Rockne Menghini, the attending physician, personally viewed and interpreted this ECG.   Date: 04/24/2017  EKG Time: 1139  Rate: 69  Rhythm: paced   Axis: normal  Intervals:none  ST&T Change: None; paced  ____________________________________________  RADIOLOGY  No results found.  ____________________________________________   PROCEDURES  Procedure(s) performed: None  Procedures  Critical Care performed: No ____________________________________________   INITIAL IMPRESSION / ASSESSMENT AND PLAN / ED COURSE  Pertinent labs & imaging results that were available during my care of the patient were reviewed by me and considered in my medical decision making (see chart for details).  66 y.o. Female with a history of ESRD and HD, chronic ascites requiring recurrent paracentesis, presenting with 1 week of 3-4 daily episodes of loose stool without any other red flag symptoms. Overall, the patient is hemodynamically stable today. Given her chronic interactions with a health care setting, I'm concerned about C. Difficile or another bacterial pathogen, so will do stool studies. Her abdominal exam is not suggestive of an acute abdominal pathology, and the patient has not had any fever, she has normal white blood cell count, so no imaging is indicated at this time. Plan reevaluation for final disposition ----------------------------------------- 11:27 AM on 04/24/2017 -----------------------------------------  The patient's creatinine 5/18 was 2.77 and today is 9.35. This may be from worsening renal disease or dehydration from her diarrhea. I will speak to the nephrologist on-call about these results. She has a minimal increase in her lipase to 55, but no abdominal pain, nausea or vomiting severe clinical significance of this is questionable.plan reevaluation for final disposition.  ----------------------------------------- 12:04 PM on 04/24/2017 -----------------------------------------  The patient has bumped her creatinine, but she also looks hemoconcentrated based on her CBC. These abnormalities are likely due to hypovolemia, with a  combination of diarrhea and just having completed dialysis. I have spoken with Dr. Thedore Mins, the nephrologist on-call, who recommends a small bolus of 250 cc of intravenous fluids, but no indication for admission based on that creatinine alone. Plan to reevaluate the patient for final disposition.  ----------------------------------------- 12:53 PM on 04/24/2017 -----------------------------------------  At this time, the patient states that she is feeling "much better." She has not had any diarrhea since she has been here and was unable to give a stool samples have provided her with a hat and a specimen cup if she is able to produce a sample to bring to her primary care physician's office for evaluation. Continued to remain hemodynamically stable and afebrile. Her repeat abdominal examination is unchanged. The patient does have bacteriuria, given her symptoms of diarrhea, all plan to treat her with 3 days of Keflex. She has C the first  dose in the emergency department. She'll follow up with her primary care physician. Plan discharge at this time. Return precautions were discussed.  ____________________________________________  FINAL CLINICAL IMPRESSION(S) / ED DIAGNOSES  Final diagnoses:  Diarrhea, unspecified type  Dehydration  Bacteriuria         NEW MEDICATIONS STARTED DURING THIS VISIT:  New Prescriptions   CEPHALEXIN (KEFLEX) 500 MG CAPSULE    Take 1 capsule (500 mg total) by mouth 4 (four) times daily.      Rockne MenghiniNorman, Anne-Ellicia, MD 04/24/17 1256

## 2017-04-24 NOTE — Discharge Instructions (Signed)
Please take a bland diet to see if this helps with your diarrhea. If you are able to get a specimen of your stool, bring it with you to your primary care doctor's office to have it tested for infection.  Return to the emergency department if you develop severe pain, lightheadedness or fainting, fever,or any other symptoms concerning to you.

## 2017-04-24 NOTE — ED Triage Notes (Signed)
Says she has had diarrhea x 1 week since they did her peritoneal dialysis here.  Came back today to dialyze and they did about 1.5 hours.  They sent her here because of the diarrhea.  Taking kaopectate

## 2017-04-25 ENCOUNTER — Ambulatory Visit
Admission: RE | Admit: 2017-04-25 | Discharge: 2017-04-25 | Disposition: A | Discharge status: Home / Self Care | Payer: Medicare Other | Source: Ambulatory Visit | Attending: Nephrology | Admitting: Nephrology

## 2017-04-25 DIAGNOSIS — R188 Other ascites: Secondary | ICD-10-CM | POA: Diagnosis not present

## 2017-04-25 MED ORDER — ALBUMIN HUMAN 25 % IV SOLN
25.0000 g | Freq: Once | INTRAVENOUS | Status: AC
  Administered 2017-04-25: 25 g via INTRAVENOUS

## 2017-04-25 MED ORDER — ALBUMIN HUMAN 25 % IV SOLN
INTRAVENOUS | Status: AC
  Administered 2017-04-25: 25 g via INTRAVENOUS
  Filled 2017-04-25: qty 100

## 2017-04-25 NOTE — Procedures (Signed)
Ultrasound-guided  therapeutic paracentesis performed yielding 6 liters (maximum ordered) of yellow  fluid. No immediate complications.  

## 2017-04-25 NOTE — Progress Notes (Signed)
Albumin completed. IV removed and site dressed with tegaderm and gauze.

## 2017-04-26 DIAGNOSIS — N186 End stage renal disease: Secondary | ICD-10-CM | POA: Diagnosis not present

## 2017-04-26 DIAGNOSIS — Z992 Dependence on renal dialysis: Secondary | ICD-10-CM | POA: Diagnosis not present

## 2017-04-26 NOTE — Discharge Instructions (Signed)
Paracentesis, Care After °Refer to this sheet in the next few weeks. These instructions provide you with information about caring for yourself after your procedure. Your health care provider may also give you more specific instructions. Your treatment has been planned according to current medical practices, but problems sometimes occur. Call your health care provider if you have any problems or questions after your procedure. °What can I expect after the procedure? °After your procedure, it is common to have a small amount of clear fluid coming from the puncture site. °Follow these instructions at home: °· Return to your normal activities as told by your health care provider. Ask your health care provider what activities are safe for you. °· Take over-the-counter and prescription medicines only as told by your health care provider. °· Do not take baths, swim, or use a hot tub until your health care provider approves. °· Follow instructions from your health care provider about: °? How to take care of your puncture site. °? When and how you should change your bandage (dressing). °? When you should remove your dressing. °· Check your puncture area every day signs of infection. Watch for: °? Redness, swelling, or pain. °? Fluid, blood, or pus. °· Keep all follow-up visits as told by your health care provider. This is important. °Contact a health care provider if: °· You have redness, swelling, or pain at your puncture site. °· You start to have more clear fluid coming from your puncture site. °· You have blood or pus coming from your puncture site. °· You have chills. °· You have a fever. °Get help right away if: °· You develop chest pain or shortness of breath. °· You develop increasing pain, discomfort, or swelling in your abdomen. °· You feel dizzy or light-headed or you pass out. °This information is not intended to replace advice given to you by your health care provider. Make sure you discuss any questions you  have with your health care provider. °Document Released: 11/04/2014 Document Revised: 11/26/2015 Document Reviewed: 09/02/2014 °Elsevier Interactive Patient Education © 2018 [employer] Inc[employer] ° °

## 2017-04-28 ENCOUNTER — Other Ambulatory Visit: Payer: Self-pay | Admitting: Nephrology

## 2017-04-28 DIAGNOSIS — R188 Other ascites: Secondary | ICD-10-CM

## 2017-04-28 DIAGNOSIS — N186 End stage renal disease: Secondary | ICD-10-CM | POA: Diagnosis not present

## 2017-04-28 DIAGNOSIS — Z992 Dependence on renal dialysis: Secondary | ICD-10-CM | POA: Diagnosis not present

## 2017-05-01 DIAGNOSIS — N186 End stage renal disease: Secondary | ICD-10-CM | POA: Diagnosis not present

## 2017-05-01 DIAGNOSIS — Z992 Dependence on renal dialysis: Secondary | ICD-10-CM | POA: Diagnosis not present

## 2017-05-02 ENCOUNTER — Ambulatory Visit
Admission: RE | Admit: 2017-05-02 | Discharge: 2017-05-02 | Disposition: A | Discharge status: Home / Self Care | Payer: Medicare Other | Source: Ambulatory Visit | Attending: Nephrology | Admitting: Nephrology

## 2017-05-02 DIAGNOSIS — R188 Other ascites: Secondary | ICD-10-CM | POA: Insufficient documentation

## 2017-05-02 MED ORDER — ALBUMIN HUMAN 25 % IV SOLN
INTRAVENOUS | Status: AC
  Filled 2017-05-02: qty 100

## 2017-05-02 MED ORDER — ALBUMIN HUMAN 25 % IV SOLN
25.0000 g | Freq: Once | INTRAVENOUS | Status: AC
  Administered 2017-05-02: 25 g via INTRAVENOUS
  Filled 2017-05-02: qty 100

## 2017-05-02 NOTE — Procedures (Signed)
PROCEDURE SUMMARY:  Successful US guided paracentesis from right lateral abdomen.  Yielded 6.0 liters of clear, yellow fluid.  No immediate complications.  Pt tolerated well.   Specimen was not sent for labs. Patient to receive albumin post-procedure.   Hoyt KochKacie Sue-Ellen Glen Blatchley PA-C 05/02/2017 11:14 AM

## 2017-05-03 DIAGNOSIS — Z992 Dependence on renal dialysis: Secondary | ICD-10-CM | POA: Diagnosis not present

## 2017-05-03 DIAGNOSIS — N186 End stage renal disease: Secondary | ICD-10-CM | POA: Diagnosis not present

## 2017-05-04 DIAGNOSIS — Z992 Dependence on renal dialysis: Secondary | ICD-10-CM | POA: Diagnosis not present

## 2017-05-04 DIAGNOSIS — N182 Chronic kidney disease, stage 2 (mild): Secondary | ICD-10-CM | POA: Diagnosis not present

## 2017-05-04 DIAGNOSIS — K259 Gastric ulcer, unspecified as acute or chronic, without hemorrhage or perforation: Secondary | ICD-10-CM | POA: Diagnosis not present

## 2017-05-04 DIAGNOSIS — R7989 Other specified abnormal findings of blood chemistry: Secondary | ICD-10-CM | POA: Diagnosis not present

## 2017-05-04 DIAGNOSIS — N186 End stage renal disease: Secondary | ICD-10-CM | POA: Diagnosis not present

## 2017-05-04 DIAGNOSIS — Z950 Presence of cardiac pacemaker: Secondary | ICD-10-CM | POA: Diagnosis not present

## 2017-05-05 ENCOUNTER — Other Ambulatory Visit: Payer: Self-pay | Admitting: Nephrology

## 2017-05-05 DIAGNOSIS — N186 End stage renal disease: Secondary | ICD-10-CM | POA: Diagnosis not present

## 2017-05-05 DIAGNOSIS — R188 Other ascites: Secondary | ICD-10-CM

## 2017-05-05 DIAGNOSIS — Z992 Dependence on renal dialysis: Secondary | ICD-10-CM | POA: Diagnosis not present

## 2017-05-05 NOTE — Discharge Instructions (Signed)
Paracentesis, Care After °Refer to this sheet in the next few weeks. These instructions provide you with information about caring for yourself after your procedure. Your health care provider may also give you more specific instructions. Your treatment has been planned according to current medical practices, but problems sometimes occur. Call your health care provider if you have any problems or questions after your procedure. °What can I expect after the procedure? °After your procedure, it is common to have a small amount of clear fluid coming from the puncture site. °Follow these instructions at home: °· Return to your normal activities as told by your health care provider. Ask your health care provider what activities are safe for you. °· Take over-the-counter and prescription medicines only as told by your health care provider. °· Do not take baths, swim, or use a hot tub until your health care provider approves. °· Follow instructions from your health care provider about: °? How to take care of your puncture site. °? When and how you should change your bandage (dressing). °? When you should remove your dressing. °· Check your puncture area every day signs of infection. Watch for: °? Redness, swelling, or pain. °? Fluid, blood, or pus. °· Keep all follow-up visits as told by your health care provider. This is important. °Contact a health care provider if: °· You have redness, swelling, or pain at your puncture site. °· You start to have more clear fluid coming from your puncture site. °· You have blood or pus coming from your puncture site. °· You have chills. °· You have a fever. °Get help right away if: °· You develop chest pain or shortness of breath. °· You develop increasing pain, discomfort, or swelling in your abdomen. °· You feel dizzy or light-headed or you pass out. °This information is not intended to replace advice given to you by your health care provider. Make sure you discuss any questions you  have with your health care provider. °Document Released: 11/04/2014 Document Revised: 11/26/2015 Document Reviewed: 09/02/2014 °Elsevier Interactive Patient Education © 2018 [employer] Inc[employer] ° °

## 2017-05-09 ENCOUNTER — Ambulatory Visit
Admission: RE | Admit: 2017-05-09 | Discharge: 2017-05-09 | Disposition: A | Discharge status: Home / Self Care | Payer: Medicare Other | Source: Ambulatory Visit | Attending: Internal Medicine | Admitting: Internal Medicine

## 2017-05-09 DIAGNOSIS — R188 Other ascites: Secondary | ICD-10-CM | POA: Insufficient documentation

## 2017-05-09 MED ORDER — ALBUMIN HUMAN 25 % IV SOLN
25.0000 g | Freq: Once | INTRAVENOUS | Status: DC
  Filled 2017-05-09: qty 100

## 2017-05-09 MED ORDER — ALBUMIN HUMAN 25 % IV SOLN
INTRAVENOUS | Status: AC
  Filled 2017-05-09: qty 100

## 2017-05-09 NOTE — Procedures (Signed)
PROCEDURE SUMMARY:  Successful US guided paracentesis from right lateral abdomen.  Yielded 5.2 liters of clear, yellow fluid.  No immediate complications.  Pt tolerated well.   Specimen was sent for labs.  Hoyt KochKacie Sue-Ellen Matthews PA-C 05/09/2017 11:47 AM

## 2017-05-10 DIAGNOSIS — Z992 Dependence on renal dialysis: Secondary | ICD-10-CM | POA: Diagnosis not present

## 2017-05-10 DIAGNOSIS — N186 End stage renal disease: Secondary | ICD-10-CM | POA: Diagnosis not present

## 2017-05-12 DIAGNOSIS — N186 End stage renal disease: Secondary | ICD-10-CM | POA: Diagnosis not present

## 2017-05-12 DIAGNOSIS — Z992 Dependence on renal dialysis: Secondary | ICD-10-CM | POA: Diagnosis not present

## 2017-05-15 ENCOUNTER — Ambulatory Visit (INDEPENDENT_AMBULATORY_CARE_PROVIDER_SITE_OTHER): Payer: Medicare Other

## 2017-05-15 ENCOUNTER — Ambulatory Visit (INDEPENDENT_AMBULATORY_CARE_PROVIDER_SITE_OTHER): Payer: Medicare Other | Admitting: Vascular Surgery

## 2017-05-15 ENCOUNTER — Other Ambulatory Visit (INDEPENDENT_AMBULATORY_CARE_PROVIDER_SITE_OTHER): Payer: Self-pay | Admitting: Vascular Surgery

## 2017-05-15 DIAGNOSIS — R1084 Generalized abdominal pain: Secondary | ICD-10-CM

## 2017-05-16 ENCOUNTER — Ambulatory Visit
Admission: RE | Admit: 2017-05-16 | Discharge: 2017-05-16 | Disposition: A | Discharge status: Home / Self Care | Payer: Medicare Other | Source: Ambulatory Visit | Attending: Nephrology | Admitting: Nephrology

## 2017-05-16 DIAGNOSIS — R188 Other ascites: Secondary | ICD-10-CM | POA: Insufficient documentation

## 2017-05-16 NOTE — Procedures (Signed)
Ultrasound-guided therapeutic paracentesis performed yielding 5 liters of serous colored fluid. No immediate complications.  Melissa Bradshaw 11:01 AM 05/16/2017

## 2017-05-17 DIAGNOSIS — Z992 Dependence on renal dialysis: Secondary | ICD-10-CM | POA: Diagnosis not present

## 2017-05-17 DIAGNOSIS — N186 End stage renal disease: Secondary | ICD-10-CM | POA: Diagnosis not present

## 2017-05-18 ENCOUNTER — Ambulatory Visit
Admission: RE | Admit: 2017-05-18 | Discharge: 2017-05-18 | Disposition: A | Discharge status: Home / Self Care | Payer: Medicare Other | Source: Ambulatory Visit | Attending: Vascular Surgery | Admitting: Vascular Surgery

## 2017-05-18 DIAGNOSIS — R188 Other ascites: Secondary | ICD-10-CM

## 2017-05-18 DIAGNOSIS — Z992 Dependence on renal dialysis: Secondary | ICD-10-CM | POA: Diagnosis not present

## 2017-05-18 DIAGNOSIS — K7589 Other specified inflammatory liver diseases: Secondary | ICD-10-CM | POA: Diagnosis not present

## 2017-05-18 HISTORY — PX: IR RADIOLOGIST EVAL & MGMT: IMG5224

## 2017-05-18 NOTE — Consult Note (Signed)
Chief Complaint: Patient was seen in consultation today for TIPS at the request of Schnier,Gregory G  Referring Physician(s): Schnier,Gregory G  History of Present Illness: Melissa Bradshaw is a 66 y.o. female with a history of polycystic kidney and liver disease who has been on hemodialysis for one year and also has a history of recurrent ascites requiring repeated paracentesis procedures since February of this year. She now is on a weekly schedule of paracentesis with removal of 5-6 L of fluid. She receives hemodialysis every Monday, Wednesday and Friday via a left arm graft. There has been some recent consideration of placing a peritoneal dialysis catheter. She has been referred for discussion of possible TIPS to treat refractory ascites. Her gastroenterologist is Dr. Allen Norris and her nephrologist is Dr. Holley Raring.  Mrs. Weems typically tolerates paracentesis procedures fairly well. After drainage, she states that she will occasionally have episodes of sharp, stabbing pain at home in her abdomen which sometimes causes her to double over in pain. She has not had any documented peritonitis and has not had any complications from her multiple paracentesis procedures.  Past Medical History:  Diagnosis Date  . Anemia   . Aneurysm ([place])    Cerebral, [place], [place]  . Arthritis   . Cardiac pacemaker   . CHF (congestive heart failure) ([place])   . Chronic kidney disease    ESRD  . Constipation   . Coronary artery disease   . Dialysis patient ([place])    Tues, Thurs, Sat  . Gastric ulcer   . Glaucoma (increased eye pressure)   . Headache   . Hypertension   . Presence of permanent cardiac pacemaker     Past Surgical History:  Procedure Laterality Date  . ANEURYSM COILING    . AV FISTULA PLACEMENT Left 07/20/2016   Procedure: ARTERIOVENOUS (AV) FISTULA CREATION;  Surgeon: Katha Cabal, MD;  Location: [facility];  Service: Vascular;  Laterality: Left;  . AV FISTULA PLACEMENT Left  08/26/2016   Procedure: INSERTION OF ARTERIOVENOUS (AV) GORE-TEX GRAFT ARM;  Surgeon: Katha Cabal, MD;  Location: [facility];  Service: Vascular;  Laterality: Left;  . COLONOSCOPY     2013  . DIALYSIS/PERMA CATHETER REMOVAL N/A 10/11/2016   Procedure: Dialysis/Perma Catheter Removal;  Surgeon: Katha Cabal, MD;  Location: [facility];  Service: Cardiovascular;  Laterality: N/A;  . INSERT / REPLACE / REMOVE PACEMAKER    . PACEMAKER PLACEMENT    . PERIPHERAL VASCULAR CATHETERIZATION N/A 06/02/2016   Procedure: Dialysis/Perma Catheter Insertion;  Surgeon: Algernon Huxley, MD;  Location: [facility];  Service: Cardiovascular;  Laterality: N/A;    Allergies: Hydralazine hcl; Iodine; and Ivp dye [iodinated diagnostic agents]  Medications: Prior to Admission medications   Medication Sig Start Date End Date Taking? Authorizing Provider  Amino Acid Infusion (PROSOL) 20 % SOLN  11/26/16  Yes [provider]  aspirin 81 MG tablet Take 81 mg by mouth daily.   Yes [provider]  calcium acetate (PHOSLO) 667 MG capsule Take 2,001 mg by mouth 3 (three) times daily with meals.    Yes [provider]  furosemide (LASIX) 20 MG tablet Take 20 mg by mouth daily.  05/17/16  Yes [provider]  latanoprost (XALATAN) 0.005 % ophthalmic solution Place 1 drop into both eyes at bedtime.   Yes [provider]  lidocaine-prilocaine (EMLA) cream  11/01/16   [provider]     Family History  Problem  Relation Age of Onset  . Hypertension Mother   . Diabetes Father   . Cancer Sister   . Stroke Sister   . Hypertension Sister     Social History   Socioeconomic History  . Marital status: Single    Spouse name: Not on file  . Number of children: Not on file  . Years of education: Not on file  . Highest education level: Not on file  Social Needs  . Financial resource strain: Not on file  . Food insecurity - worry: Not on file  .  Food insecurity - inability: Not on file  . Transportation needs - medical: Not on file  . Transportation needs - non-medical: Not on file  Occupational History  . Not on file  Tobacco Use  . Smoking status: Current Every Day Smoker    Packs/day: 0.25    Years: 41.00    Pack years: 10.25    Types: Cigarettes  . Smokeless tobacco: Never Used  Substance and Sexual Activity  . Alcohol use: No  . Drug use: No  . Sexual activity: Not on file  Other Topics Concern  . Not on file  Social History Narrative  . Not on file    Review of Systems: A 12 point ROS discussed and pertinent positives are indicated in the HPI above.  All other systems are negative.  Review of Systems  Constitutional: Negative.   HENT: Negative.   Respiratory: Negative.   Cardiovascular: Negative.   Gastrointestinal: Positive for abdominal distention and abdominal pain. Negative for blood in stool, constipation, diarrhea, nausea, rectal pain and vomiting.  Genitourinary: Negative.   Musculoskeletal: Negative.   Neurological: Negative.     Vital Signs: BP 114/61   Pulse 75   Temp 98 F (36.7 C) (Oral)   Resp 14   Ht 5' 5.5" (1.664 m)   Wt 120 lb (54.4 kg)   BMI 19.67 kg/m   Physical Exam  Constitutional: She is oriented to person, place, and time. No distress.  HENT:  Head: Normocephalic and atraumatic.  Neck: Neck supple. No JVD present.  Cardiovascular: Normal rate, regular rhythm and normal heart sounds. Exam reveals no friction rub.  No murmur heard. Pulmonary/Chest: Effort normal. No respiratory distress. She has no wheezes.  Some rhonchi bilaterally.  Abdominal: Soft. Bowel sounds are normal. She exhibits distension. She exhibits no mass. There is no tenderness. There is no rebound and no guarding.  Musculoskeletal: She exhibits no edema.  Neurological: She is alert and oriented to person, place, and time.  Skin: Skin is warm and dry. She is not diaphoretic.  Vitals  reviewed.   Imaging: US Paracentesis  Result Date: 05/16/2017 INDICATION: Patient with a history of end-stage renal disease on hemodialysis along with congestive heart failure and recurrent abdominal ascites. Request is made for therapeutic paracentesis with a maximum of 6 L. EXAM: ULTRASOUND GUIDED THERAPEUTIC PARACENTESIS MEDICATIONS: 1% xylocaine COMPLICATIONS: None immediate. PROCEDURE: Informed written consent was obtained from the patient after a discussion of the risks, benefits and alternatives to treatment. A timeout was performed prior to the initiation of the procedure. Initial ultrasound scanning demonstrates a moderate amount of ascites within the right lower abdominal quadrant. The right lower abdomen was prepped and draped in the usual sterile fashion. 1% xylocaine was used for local anesthesia. Following this, a Safe-T-Centesis catheter was introduced. An ultrasound image was saved for documentation purposes. The paracentesis was performed. The catheter was removed and a dressing was applied. The patient tolerated  the procedure well without immediate post procedural complication. FINDINGS: A total of approximately 5 L of serous fluid was removed. IMPRESSION: Successful ultrasound-guided paracentesis yielding 5 liters of peritoneal fluid. Read by: Saverio Danker, PA-C Electronically Signed   By: Marybelle Killings M.D.   On: 05/16/2017 11:03   US Paracentesis  Result Date: 05/09/2017 INDICATION: Patient with recurrent ascites. Request is made for therapeutic paracentesis of up to 6 liters maximum. EXAM: ULTRASOUND GUIDED THERAPEUTIC PARACENTESIS MEDICATIONS: 10 mL 1% lidocaine COMPLICATIONS: None immediate. PROCEDURE: Informed written consent was obtained from the patient after a discussion of the risks, benefits and alternatives to treatment. A timeout was performed prior to the initiation of the procedure. Initial ultrasound scanning demonstrates a large amount of ascites within the right  lateral abdomen. The right lateral abdomen was prepped and draped in the usual sterile fashion. 1% lidocaine was used for local anesthesia. Following this, a 19 gauge, 7-cm, Yueh catheter was introduced. An ultrasound image was saved for documentation purposes. The paracentesis was performed. The catheter was removed and a dressing was applied. The patient tolerated the procedure well without immediate post procedural complication. FINDINGS: A total of approximately 5.2 liters of clear, yellow fluid was removed. IMPRESSION: Successful ultrasound-guided therapeutic paracentesis yielding 5.2 liters of peritoneal fluid. Patient to receive albumin postprocedure. Read by:  Brynda Greathouse PA-C Electronically Signed   By: Sandi Mariscal M.D.   On: 05/09/2017 11:50   US Paracentesis  Result Date: 05/02/2017 INDICATION: Patient with history of recurrent ascites. Request is made for therapeutic paracentesis of up to 6 liters maximum. Patient to receive albumin postprocedure. EXAM: ULTRASOUND GUIDED THERAPEUTIC PARACENTESIS MEDICATIONS: 10 mL 1% lidocaine COMPLICATIONS: None immediate. PROCEDURE: Informed written consent was obtained from the patient after a discussion of the risks, benefits and alternatives to treatment. A timeout was performed prior to the initiation of the procedure. Initial ultrasound scanning demonstrates a large amount of ascites within the right lateral abdomen quadrant. The right lateral abdomen was prepped and draped in the usual sterile fashion. 1% lidocaine was used for local anesthesia. Following this, a 19 gauge, 7-cm, Yueh catheter was introduced. An ultrasound image was saved for documentation purposes. The paracentesis was performed. The catheter was removed and a dressing was applied. The patient tolerated the procedure well without immediate post procedural complication. FINDINGS: A total of approximately 6.0 of clear, yellow fluid was removed. IMPRESSION: Successful ultrasound-guided  therapeutic paracentesis yielding 6.0 liters of peritoneal fluid. Read by:  Brynda Greathouse PA-C Electronically Signed   By: Markus Daft M.D.   On: 05/02/2017 11:17   US Paracentesis  Result Date: 04/25/2017 INDICATION: End-stage renal disease, polycystic kidney disease, CHF, recurrent ascites. Request made for therapeutic paracentesis up to 6 liters. EXAM: ULTRASOUND GUIDED THERAPEUTIC PARACENTESIS MEDICATIONS: None. COMPLICATIONS: None immediate. PROCEDURE: Informed written consent was obtained from the patient after a discussion of the risks, benefits and alternatives to treatment. A timeout was performed prior to the initiation of the procedure. Initial ultrasound scanning demonstrates a large amount of ascites within the right lower abdominal quadrant. The right lower abdomen was prepped and draped in the usual sterile fashion. 1% lidocaine was used for local anesthesia. Following this, a Safe-T-Centesis catheter was introduced. An ultrasound image was saved for documentation purposes. The paracentesis was performed. The catheter was removed and a dressing was applied. The patient tolerated the procedure well without immediate post procedural complication. FINDINGS: A total of approximately 6 liters of yellow fluid was removed. IMPRESSION: Successful ultrasound-guided therapeutic paracentesis  yielding 6 liters of peritoneal fluid. Read by: Rowe Robert, PA-C Electronically Signed   By: Inez Catalina M.D.   On: 04/25/2017 10:00    Labs:  CBC: Recent Labs    07/13/16 1102  08/19/16 0957 08/26/16 1005 11/23/16 1128 04/24/17 0939  WBC 5.0  --  5.9  --  5.0 5.4  HGB 8.7*   < > 9.6* 10.9* 10.5* 13.7  HCT 26.6*   < > 28.2* 32.0* 30.8* 40.8  PLT 349  --  355  --  238 332   < > = values in this interval not displayed.    COAGS: Recent Labs    07/13/16 1102  INR 1.11  APTT 31    BMP: Recent Labs    06/06/16 0934 07/13/16 1102 07/20/16 0640 08/19/16 0957 08/26/16 1005 11/23/16 1128  04/24/17 0939  NA 137 143 143 140 139 141 141  K 3.6 3.9 4.1 4.2 3.7 2.8* 4.3  CL 100* 102  --  100*  --  100* 100*  CO2 27 25  --  29  --  33* 29  GLUCOSE 155* 88 96  --  97 93 96  BUN 19 46*  --   --   --  20 54*  CALCIUM 8.1* 8.4*  --   --   --  7.7* 7.5*  CREATININE 2.98* 4.00*  --   --   --  2.77* 9.35*  GFRNONAA 15* 11*  --   --   --  17* 4*  GFRAA 18* 12*  --   --   --  19* 4*    LIVER FUNCTION TESTS: Recent Labs    11/23/16 1128 04/24/17 0939  BILITOT 0.5 0.7  AST 20 17  ALT 10* 10*  ALKPHOS 32* 33*  PROT 5.6* 6.1*  ALBUMIN 2.5* 2.4*    Assessment and Plan:  I met with Mrs. Hemrick and her daughter. I also reviewed CT's of the abdomen and pelvis without contrast on 06/05/2016 and 06/19/2015.  I told Mrs. Gootee that there are some factors that make her a poor candidate for TIPS. Her traditional MELD and MELD-Na score are both 21 based on available lab tests, which places her into a higher risk category for mortality after TIPS. Typically a score of greater than 18-20 would be considered higher risk. The main reason her score is elevated is due to hemodialysis.  In addition, the liver is predominately occupied by cysts, some of which are quite large by imaging. This distortion of the liver parenchyma would make it very challenging to establish a shunt between the hepatic veins and portal venous system due to likely altered morphology of veins which may be both attenuated and altered in course due to the prominent polycystic disease present. There also is the question of whether she truly has cirrhosis or portal hypertension as the cause of ascites. Her fluid accumulation may be largely due to renal failure and hypoproteinemia as her liver function tests are all normal and the liver does not appear obviously cirrhotic by imaging.  I recommended continued management with periodic paracentesis procedures as needed. I told Mrs. Hemmer that she may want to alter the interval between  paracentesis procedures at times depending on how symptomatic she feels rather than sticking to an absolute every 7 day interval.  Thank you for this interesting consult.  I greatly enjoyed meeting TRESSA MALDONADO and look forward to participating in their care.  A copy of this report was sent  to the requesting provider on this date.  Electronically SignedAletta Edouard T 05/18/2017, 11:17 AM     I spent a total of 40 Minutes in face to face in clinical consultation, greater than 50% of which was counseling/coordinating care for ascites.

## 2017-05-23 ENCOUNTER — Ambulatory Visit
Admission: RE | Admit: 2017-05-23 | Discharge: 2017-05-23 | Disposition: A | Discharge status: Home / Self Care | Payer: Medicare Other | Source: Ambulatory Visit | Attending: Internal Medicine | Admitting: Internal Medicine

## 2017-05-23 DIAGNOSIS — R188 Other ascites: Secondary | ICD-10-CM

## 2017-05-23 MED ORDER — ALBUMIN HUMAN 25 % IV SOLN: 25.0000 g | Freq: Once | INTRAVENOUS | Status: DC

## 2017-05-23 NOTE — Procedures (Signed)
PROCEDURE SUMMARY:  Successful US guided paracentesis from RLQ.  Yielded 6 L of clear yellow fluid.  No immediate complications.  Pt tolerated well.   Specimen was not sent for labs.  Brayton ElBRUNING, Doneen Ollinger PA-C 05/23/2017 11:09 AM

## 2017-05-24 ENCOUNTER — Encounter: Payer: Self-pay | Admitting: Interventional Radiology

## 2017-05-29 ENCOUNTER — Ambulatory Visit (INDEPENDENT_AMBULATORY_CARE_PROVIDER_SITE_OTHER): Payer: Medicare Other | Admitting: Vascular Surgery

## 2017-05-30 ENCOUNTER — Ambulatory Visit
Admission: RE | Admit: 2017-05-30 | Discharge: 2017-05-30 | Disposition: A | Discharge status: Home / Self Care | Payer: Medicare Other | Source: Ambulatory Visit

## 2017-05-30 DIAGNOSIS — E875 Hyperkalemia: Secondary | ICD-10-CM | POA: Diagnosis not present

## 2017-05-30 DIAGNOSIS — G9349 Other encephalopathy: Secondary | ICD-10-CM | POA: Diagnosis not present

## 2017-05-30 DIAGNOSIS — N186 End stage renal disease: Secondary | ICD-10-CM | POA: Diagnosis not present

## 2017-05-30 DIAGNOSIS — G8929 Other chronic pain: Secondary | ICD-10-CM | POA: Diagnosis not present

## 2017-05-30 DIAGNOSIS — N2581 Secondary hyperparathyroidism of renal origin: Secondary | ICD-10-CM | POA: Diagnosis not present

## 2017-05-30 DIAGNOSIS — I5030 Unspecified diastolic (congestive) heart failure: Secondary | ICD-10-CM | POA: Diagnosis not present

## 2017-05-30 DIAGNOSIS — R197 Diarrhea, unspecified: Secondary | ICD-10-CM | POA: Diagnosis not present

## 2017-05-30 DIAGNOSIS — E872 Acidosis: Secondary | ICD-10-CM | POA: Diagnosis not present

## 2017-05-30 DIAGNOSIS — Z950 Presence of cardiac pacemaker: Secondary | ICD-10-CM | POA: Diagnosis not present

## 2017-05-30 DIAGNOSIS — R109 Unspecified abdominal pain: Secondary | ICD-10-CM | POA: Diagnosis not present

## 2017-05-30 DIAGNOSIS — I2510 Atherosclerotic heart disease of native coronary artery without angina pectoris: Secondary | ICD-10-CM | POA: Diagnosis not present

## 2017-05-30 DIAGNOSIS — Z87891 Personal history of nicotine dependence: Secondary | ICD-10-CM | POA: Diagnosis not present

## 2017-05-30 DIAGNOSIS — H409 Unspecified glaucoma: Secondary | ICD-10-CM | POA: Diagnosis not present

## 2017-05-30 DIAGNOSIS — R188 Other ascites: Secondary | ICD-10-CM | POA: Insufficient documentation

## 2017-05-30 DIAGNOSIS — R140 Abdominal distension (gaseous): Secondary | ICD-10-CM | POA: Diagnosis not present

## 2017-05-30 DIAGNOSIS — R112 Nausea with vomiting, unspecified: Secondary | ICD-10-CM | POA: Diagnosis not present

## 2017-05-30 DIAGNOSIS — I120 Hypertensive chronic kidney disease with stage 5 chronic kidney disease or end stage renal disease: Secondary | ICD-10-CM | POA: Diagnosis not present

## 2017-05-30 DIAGNOSIS — Z992 Dependence on renal dialysis: Secondary | ICD-10-CM | POA: Diagnosis not present

## 2017-05-30 DIAGNOSIS — Q446 Cystic disease of liver: Secondary | ICD-10-CM | POA: Diagnosis not present

## 2017-05-30 DIAGNOSIS — I132 Hypertensive heart and chronic kidney disease with heart failure and with stage 5 chronic kidney disease, or end stage renal disease: Secondary | ICD-10-CM | POA: Diagnosis not present

## 2017-05-30 DIAGNOSIS — Z8673 Personal history of transient ischemic attack (TIA), and cerebral infarction without residual deficits: Secondary | ICD-10-CM | POA: Diagnosis not present

## 2017-05-30 DIAGNOSIS — D631 Anemia in chronic kidney disease: Secondary | ICD-10-CM | POA: Diagnosis not present

## 2017-05-30 DIAGNOSIS — Z9115 Patient's noncompliance with renal dialysis: Secondary | ICD-10-CM | POA: Diagnosis not present

## 2017-05-30 DIAGNOSIS — Z91041 Radiographic dye allergy status: Secondary | ICD-10-CM | POA: Diagnosis not present

## 2017-05-30 DIAGNOSIS — Z888 Allergy status to other drugs, medicaments and biological substances status: Secondary | ICD-10-CM | POA: Diagnosis not present

## 2017-05-30 NOTE — Procedures (Signed)
PROCEDURE SUMMARY:  Successful US guided paracentesis from left lateral abdomen.  Yielded 6 liters of clear yellow fluid.  No immediate complications.  Pt tolerated well.   [place] Duerst S Dawsyn Zurn PA-C 05/30/2017 11:32 AM

## 2017-05-31 ENCOUNTER — Emergency Department
Admission: EM | Admit: 2017-05-31 | Discharge: 2017-05-31 | Disposition: A | Discharge status: Home / Self Care | Payer: Medicare Other | Source: Home / Self Care

## 2017-05-31 ENCOUNTER — Encounter: Payer: Self-pay | Admitting: Intensive Care

## 2017-05-31 DIAGNOSIS — Z5321 Procedure and treatment not carried out due to patient leaving prior to being seen by health care provider: Secondary | ICD-10-CM

## 2017-05-31 DIAGNOSIS — R112 Nausea with vomiting, unspecified: Secondary | ICD-10-CM | POA: Insufficient documentation

## 2017-05-31 DIAGNOSIS — R197 Diarrhea, unspecified: Secondary | ICD-10-CM

## 2017-05-31 LAB — CBC
HCT: 43.3 % (ref 35.0–47.0)
HEMOGLOBIN: 14.5 g/dL (ref 12.0–16.0)
MCH: 28.4 pg (ref 26.0–34.0)
MCHC: 33.6 g/dL (ref 32.0–36.0)
MCV: 84.6 fL (ref 80.0–100.0)
PLATELETS: 368 10*3/uL (ref 150–440)
RBC: 5.12 MIL/uL (ref 3.80–5.20)
RDW: 15.8 % — ABNORMAL HIGH (ref 11.5–14.5)
WBC: 6.3 10*3/uL (ref 3.6–11.0)

## 2017-05-31 LAB — COMPREHENSIVE METABOLIC PANEL
ALK PHOS: 49 U/L (ref 38–126)
ALT: 11 U/L — AB (ref 14–54)
ANION GAP: 22 — AB (ref 5–15)
AST: 18 U/L (ref 15–41)
Albumin: 2.3 g/dL — ABNORMAL LOW (ref 3.5–5.0)
BILIRUBIN TOTAL: 0.9 mg/dL (ref 0.3–1.2)
BUN: 103 mg/dL — ABNORMAL HIGH (ref 6–20)
CALCIUM: 7.3 mg/dL — AB (ref 8.9–10.3)
CO2: 14 mmol/L — ABNORMAL LOW (ref 22–32)
CREATININE: 18.95 mg/dL — AB (ref 0.44–1.00)
Chloride: 100 mmol/L — ABNORMAL LOW (ref 101–111)
GFR, EST AFRICAN AMERICAN: 2 mL/min — AB (ref >60)
GFR, EST NON AFRICAN AMERICAN: 2 mL/min — AB (ref >60)
Glucose, Bld: 94 mg/dL (ref 65–99)
Potassium: 6.2 mmol/L — ABNORMAL HIGH (ref 3.5–5.1)
Sodium: 136 mmol/L (ref 135–145)
TOTAL PROTEIN: 5.7 g/dL — AB (ref 6.5–8.1)

## 2017-05-31 LAB — LIPASE, BLOOD: Lipase: 41 U/L (ref 11–51)

## 2017-05-31 MED ORDER — ONDANSETRON 4 MG PO TBDP
4.0000 mg | ORAL_TABLET | Freq: Once | ORAL | Status: AC | PRN
  Administered 2017-05-31: 4 mg via ORAL
  Filled 2017-05-31: qty 1

## 2017-05-31 NOTE — ED Notes (Signed)
Called in waiting room.  No answer. 

## 2017-05-31 NOTE — ED Notes (Signed)
Called patient in waiting room.  No answer.  Looking in bathroom and outside.  No response.

## 2017-05-31 NOTE — ED Triage Notes (Signed)
Patient states "I have been sick for weeks and had multiple tests done and have been told nothing" Sees Dr. Jeanella CaraLatiffe. Has not had dialysis in two weeks.L graft access for dialysis M,W,F. Paracentesis on Fridays. Patient is here today to be seen for N/V/D since 0100 this morning. A&O x4 upon arrival

## 2017-06-01 ENCOUNTER — Inpatient Hospital Stay: Payer: Medicare Other

## 2017-06-01 ENCOUNTER — Telehealth: Payer: Self-pay | Admitting: Emergency Medicine

## 2017-06-01 ENCOUNTER — Other Ambulatory Visit: Payer: Self-pay

## 2017-06-01 ENCOUNTER — Inpatient Hospital Stay
Admission: AD | Admit: 2017-06-01 | Discharge: 2017-06-03 | DRG: 640 | Disposition: A | Discharge status: Home / Self Care | Payer: Medicare Other | Source: Ambulatory Visit | Attending: Internal Medicine | Admitting: Internal Medicine

## 2017-06-01 DIAGNOSIS — E875 Hyperkalemia: Secondary | ICD-10-CM | POA: Diagnosis present

## 2017-06-01 DIAGNOSIS — Q446 Cystic disease of liver: Secondary | ICD-10-CM

## 2017-06-01 DIAGNOSIS — Z9115 Patient's noncompliance with renal dialysis: Secondary | ICD-10-CM

## 2017-06-01 DIAGNOSIS — R180 Malignant ascites: Secondary | ICD-10-CM

## 2017-06-01 DIAGNOSIS — Z950 Presence of cardiac pacemaker: Secondary | ICD-10-CM | POA: Diagnosis not present

## 2017-06-01 DIAGNOSIS — I2510 Atherosclerotic heart disease of native coronary artery without angina pectoris: Secondary | ICD-10-CM | POA: Diagnosis present

## 2017-06-01 DIAGNOSIS — Z8673 Personal history of transient ischemic attack (TIA), and cerebral infarction without residual deficits: Secondary | ICD-10-CM

## 2017-06-01 DIAGNOSIS — Q613 Polycystic kidney, unspecified: Secondary | ICD-10-CM | POA: Diagnosis not present

## 2017-06-01 DIAGNOSIS — E872 Acidosis: Secondary | ICD-10-CM | POA: Diagnosis present

## 2017-06-01 DIAGNOSIS — D631 Anemia in chronic kidney disease: Secondary | ICD-10-CM | POA: Diagnosis present

## 2017-06-01 DIAGNOSIS — I5030 Unspecified diastolic (congestive) heart failure: Secondary | ICD-10-CM | POA: Diagnosis present

## 2017-06-01 DIAGNOSIS — I132 Hypertensive heart and chronic kidney disease with heart failure and with stage 5 chronic kidney disease, or end stage renal disease: Secondary | ICD-10-CM | POA: Diagnosis present

## 2017-06-01 DIAGNOSIS — R197 Diarrhea, unspecified: Secondary | ICD-10-CM | POA: Diagnosis present

## 2017-06-01 DIAGNOSIS — N2581 Secondary hyperparathyroidism of renal origin: Secondary | ICD-10-CM | POA: Diagnosis present

## 2017-06-01 DIAGNOSIS — R188 Other ascites: Secondary | ICD-10-CM | POA: Diagnosis present

## 2017-06-01 DIAGNOSIS — Z992 Dependence on renal dialysis: Secondary | ICD-10-CM

## 2017-06-01 DIAGNOSIS — G9349 Other encephalopathy: Secondary | ICD-10-CM | POA: Diagnosis present

## 2017-06-01 DIAGNOSIS — N186 End stage renal disease: Secondary | ICD-10-CM | POA: Diagnosis present

## 2017-06-01 DIAGNOSIS — G8929 Other chronic pain: Secondary | ICD-10-CM | POA: Diagnosis present

## 2017-06-01 DIAGNOSIS — Z888 Allergy status to other drugs, medicaments and biological substances status: Secondary | ICD-10-CM

## 2017-06-01 DIAGNOSIS — Z87891 Personal history of nicotine dependence: Secondary | ICD-10-CM

## 2017-06-01 DIAGNOSIS — H409 Unspecified glaucoma: Secondary | ICD-10-CM | POA: Diagnosis present

## 2017-06-01 DIAGNOSIS — Z91041 Radiographic dye allergy status: Secondary | ICD-10-CM

## 2017-06-01 LAB — CREATININE, SERUM
Creatinine, Ser: 19.61 mg/dL — ABNORMAL HIGH (ref 0.44–1.00)
GFR calc Af Amer: 2 mL/min — ABNORMAL LOW (ref >60)
GFR, EST NON AFRICAN AMERICAN: 2 mL/min — AB (ref >60)

## 2017-06-01 LAB — CBC
HEMATOCRIT: 41.3 % (ref 35.0–47.0)
Hemoglobin: 13.8 g/dL (ref 12.0–16.0)
MCH: 28.6 pg (ref 26.0–34.0)
MCHC: 33.4 g/dL (ref 32.0–36.0)
MCV: 85.9 fL (ref 80.0–100.0)
PLATELETS: 352 10*3/uL (ref 150–440)
RBC: 4.82 MIL/uL (ref 3.80–5.20)
RDW: 15.9 % — AB (ref 11.5–14.5)
WBC: 6.3 10*3/uL (ref 3.6–11.0)

## 2017-06-01 LAB — MRSA PCR SCREENING: MRSA by PCR: NEGATIVE

## 2017-06-01 LAB — POTASSIUM: Potassium: 3.4 mmol/L — ABNORMAL LOW (ref 3.5–5.1)

## 2017-06-01 MED ORDER — HEPARIN SODIUM (PORCINE) 5000 UNIT/ML IJ SOLN
5000.0000 [IU] | Freq: Three times a day (TID) | INTRAMUSCULAR | Status: DC
  Administered 2017-06-01 – 2017-06-02 (×4): 5000 [IU] via SUBCUTANEOUS
  Filled 2017-06-01 (×5): qty 1

## 2017-06-01 MED ORDER — ACETAMINOPHEN 325 MG PO TABS: 650.0000 mg | ORAL_TABLET | Freq: Four times a day (QID) | ORAL | Status: DC | PRN

## 2017-06-01 MED ORDER — TRAZODONE HCL 50 MG PO TABS: 25.0000 mg | ORAL_TABLET | Freq: Every evening | ORAL | Status: DC | PRN

## 2017-06-01 MED ORDER — DOCUSATE SODIUM 100 MG PO CAPS
100.0000 mg | ORAL_CAPSULE | Freq: Two times a day (BID) | ORAL | Status: DC
  Filled 2017-06-01 (×2): qty 1

## 2017-06-01 MED ORDER — ONDANSETRON HCL 4 MG/2ML IJ SOLN
4.0000 mg | Freq: Four times a day (QID) | INTRAMUSCULAR | Status: DC | PRN
  Administered 2017-06-02 – 2017-06-03 (×4): 4 mg via INTRAVENOUS
  Filled 2017-06-01 (×4): qty 2

## 2017-06-01 MED ORDER — HYDROCODONE-ACETAMINOPHEN 5-325 MG PO TABS
1.0000 | ORAL_TABLET | ORAL | Status: DC | PRN
  Administered 2017-06-01 (×2): 1 via ORAL
  Administered 2017-06-02: 2 via ORAL
  Administered 2017-06-02 (×2): 1 via ORAL
  Filled 2017-06-01: qty 1
  Filled 2017-06-01: qty 2
  Filled 2017-06-01 (×3): qty 1

## 2017-06-01 MED ORDER — ASPIRIN EC 81 MG PO TBEC
81.0000 mg | DELAYED_RELEASE_TABLET | Freq: Every day | ORAL | Status: DC
  Administered 2017-06-01 – 2017-06-03 (×2): 81 mg via ORAL
  Filled 2017-06-01 (×2): qty 1

## 2017-06-01 MED ORDER — BISACODYL 5 MG PO TBEC: 5.0000 mg | DELAYED_RELEASE_TABLET | Freq: Every day | ORAL | Status: DC | PRN

## 2017-06-01 MED ORDER — ACETAMINOPHEN 650 MG RE SUPP: 650.0000 mg | Freq: Four times a day (QID) | RECTAL | Status: DC | PRN

## 2017-06-01 MED ORDER — ONDANSETRON HCL 4 MG PO TABS
4.0000 mg | ORAL_TABLET | Freq: Four times a day (QID) | ORAL | Status: DC | PRN
  Administered 2017-06-03: 4 mg via ORAL
  Filled 2017-06-01: qty 1

## 2017-06-01 MED ORDER — FUROSEMIDE 20 MG PO TABS
20.0000 mg | ORAL_TABLET | Freq: Every day | ORAL | Status: DC
  Administered 2017-06-01: 20 mg via ORAL
  Filled 2017-06-01: qty 1

## 2017-06-01 NOTE — Care Management (Signed)
Chronic HD patient.  Melissa ChyleAmanda Bradshaw HD liaison notified of admission.  [facility]manda [facility]

## 2017-06-01 NOTE — Significant Event (Signed)
Rapid Response Event Note  Overview: Time Called: 1755 Arrival Time: 1758 Event Type: Hypotension  Initial Focused Assessment: When arrived in dialysis unit, patient resting comfortably in bed. Dialysis RN reported that patient had been hypotensive (SBP in 70s)  and dizzy during treatment just now. Hypotension and dizziness resolved after 500 cc bolus. Patient currently reports feeling fine and reports no symptoms. SBP in 110s and heart rate in 80s, O2 sat 100% on room air and RR 14.   Interventions: none  Plan of Care (if not transferred): Dialysis RN instructed to call Rapid Response back if patient destabilizes.  Outcome: Other (Comment)(Patient stabilized by dialysis RN prior to Rapid Response RN arrival)  Event End Time: 1758  Adron BeneMOORE, Zelpha Messing [facility] Main[facility]IZABETH

## 2017-06-01 NOTE — Progress Notes (Signed)
Pre HD assessment, MD at bedside

## 2017-06-01 NOTE — Progress Notes (Signed)
Post HD assessment  

## 2017-06-01 NOTE — Telephone Encounter (Addendum)
Attempted to call patient due to left without being seen.  She has elevated potassium, but per the chart she has not been dialyzed for 2 weeks.  Home number has no answer and cell is not in service.    I have contacted patient's daughter Sherlynn CarbonLa Kesha, and I explained about elevated potassium and need for dialysis-including this potentially causing heart arrhythmias.  I asked about why patient has not been dialyzed, and daughter said dialysis center had told them they could not dialyze her but no reason why.  She was supposed to come to hospital to be dialyzed.  I advised her to get her mother to return to the ED now.  She agrees.

## 2017-06-01 NOTE — Progress Notes (Signed)
HD tx end  

## 2017-06-01 NOTE — H&P (Signed)
[facility] - [place] at [facility]   PATIENT NAME: Melissa Bradshaw    MR#:  [id number]  DATE OF BIRTH:  04-14-1951  DATE OF ADMISSION:  06/01/2017  PRIMARY CARE PHYSICIAN: Corky Downs, MD   REQUESTING/REFERRING PHYSICIAN: Dr. Donah Driver  CHIEF COMPLAINT:  No chief complaint on file.   HISTORY OF PRESENT ILLNESS:  Judye Lorino  is a 66 y.o. female with a known history of essential hypertension, started on hemodialysis did not go for dialysis for 2 weeks due to side effects with hemodialysis patient says that every time she gets hd that she gets severe cramps then because of side effects he did not go the last 2 weeks and she missed 6 sessions of hemodialysis.  Patient found to have severe hyperkalemia with potassium 6.7 on routine labs, sent in as a direct admit.  Seen in dialysis unit.  Patient complains of abdominal pain generalized about been going on since of blood associated with some diarrhea, nausea.  Patient states that appetite also is poor.  Gets a paracentesis every week.  In [facility] emergency room last night but due to long wait severe abdominal pain she left. PAST MEDICAL HISTORY:   Past Medical History:  Diagnosis Date  . Anemia   . Aneurysm (HCC)    Cerebral, [place]  . Arthritis   . Cardiac pacemaker   . CHF (congestive heart failure) (HCC)   . Chronic kidney disease    ESRD  . Constipation   . Coronary artery disease   . Dialysis patient (HCC)    Tues, Thurs, Sat  . Gastric ulcer   . Glaucoma (increased eye pressure)   . Headache   . Hypertension   . Presence of permanent cardiac pacemaker     PAST SURGICAL HISTOIRY:   Past Surgical History:  Procedure Laterality Date  . ANEURYSM COILING    . AV FISTULA PLACEMENT Left 07/20/2016   Procedure: ARTERIOVENOUS (AV) FISTULA CREATION;  Surgeon: Renford Dills, MD;  Location: [facility] ORS;  Service: Vascular;  Laterality: Left;  . AV FISTULA PLACEMENT Left 08/26/2016   Procedure: INSERTION OF ARTERIOVENOUS (AV) GORE-TEX GRAFT ARM;  Surgeon: Renford Dills, MD;  Location: [facility] ORS;  Service: Vascular;  Laterality: Left;  . COLONOSCOPY     2013  . DIALYSIS/PERMA CATHETER REMOVAL N/A 10/11/2016   Procedure: Dialysis/Perma Catheter Removal;  Surgeon: Renford Dills, MD;  Location: [facility] INVASIVE CV LAB;  Service: Cardiovascular;  Laterality: N/A;  . INSERT / REPLACE / REMOVE PACEMAKER    . IR RADIOLOGIST EVAL & MGMT  05/18/2017  . PACEMAKER PLACEMENT    . PERIPHERAL VASCULAR CATHETERIZATION N/A 06/02/2016   Procedure: Dialysis/Perma Catheter Insertion;  Surgeon: Annice Needy, MD;  Location: [facility] INVASIVE CV LAB;  Service: Cardiovascular;  Laterality: N/A;    SOCIAL HISTORY:   Social History   Tobacco Use  . Smoking status: Former Smoker    Packs/day: 0.25    Years: 41.00    Pack years: 10.25    Types: Cigarettes    Last attempt to quit: 2007    Years since quitting: 11.9  . Smokeless tobacco: Never Used  . Tobacco comment: pt lives with people who smoke in house  Substance Use Topics  . Alcohol use: No    FAMILY HISTORY:   Family History  Problem Relation Age of Onset  . Hypertension Mother   . Diabetes Father   . Cancer Sister   . Stroke Sister   .  Hypertension Sister     DRUG ALLERGIES:   Allergies  Allergen Reactions  . Hydralazine Hcl Other (See Comments)    Makes pt jittery, nervous, messes with vision   . Iodine Itching and Rash    Unknown reaction  . Ivp Dye [Iodinated Diagnostic Agents] Rash    REVIEW OF SYSTEMS:  CONSTITUTIONAL: No fever, fatigue or weakness.  EYES: No blurred or double vision.  EARS, NOSE, AND THROAT: No tinnitus or ear pain.  RESPIRATORY: No cough, shortness of breath, wheezing or hemoptysis.  CARDIOVASCULAR: No chest pain, orthopnea, edema.  GASTROINTESTINAL: generalized abdominal pain, diarrhea.   GENITOURINARY: No dysuria, hematuria.  ENDOCRINE: No polyuria, nocturia,  HEMATOLOGY: No anemia,  easy bruising or bleeding SKIN: No rash or lesion. MUSCULOSKELETAL: No joint pain or arthritis.   NEUROLOGIC: No tingling, numbness, weakness.  PSYCHIATRY: No anxiety or depression.   MEDICATIONS AT HOME:   Prior to Admission medications   Medication Sig Start Date End Date Taking? Authorizing Provider  Amino Acid Infusion (PROSOL) 20 % SOLN  11/26/16   [provider]  aspirin 81 MG tablet Take 81 mg by mouth daily.    [provider]  calcium acetate (PHOSLO) 667 MG capsule Take 2,001 mg by mouth 3 (three) times daily with meals.     [provider]  furosemide (LASIX) 20 MG tablet Take 20 mg by mouth daily.  05/17/16   [provider]  latanoprost (XALATAN) 0.005 % ophthalmic solution Place 1 drop into both eyes at bedtime.    [provider]  lidocaine-prilocaine (EMLA) cream  11/01/16   [provider]      VITAL SIGNS:  Blood pressure (!) 151/66, pulse 60, temperature 98.1 F (36.7 C), temperature source Oral, resp. rate 11, height 5' 5.5" (1.664 m), weight 50.8 kg (111 lb 14.4 oz), SpO2 97 %.  PHYSICAL EXAMINATION:  GENERAL:  66 y.o.-year-old patient lying in the bed with no acute distress.  EYES: Pupils equal, round, reactive to light and accommodation. No scleral icterus. Extraocular muscles intact.  HEENT: Head atraumatic, normocephalic. Oropharynx and nasopharynx clear.  NECK:  Supple, no jugular venous distention. No thyroid enlargement, no tenderness.  LUNGS: Normal breath sounds bilaterally, no wheezing, rales,rhonchi or crepitation. No use of accessory muscles of respiration.  CARDIOVASCULAR: S1, S2 normal. No murmurs, rubs, or gallops.  ABDOMEN: Soft, nontender, nondistended. Bowel sounds present. No organomegaly or mass.  EXTREMITIES: No pedal edema, cyanosis, or clubbing.  NEUROLOGIC: Cranial nerves II through XII are intact. Muscle strength 5/5 in all extremities. Sensation intact. Gait not checked.  PSYCHIATRIC:  The patient is alert and oriented x 3.  SKIN: No obvious rash, lesion, or ulcer.   LABORATORY PANEL:   CBC Recent Labs  Lab 06/01/17 1333  WBC 6.3  HGB 13.8  HCT 41.3  PLT 352   ------------------------------------------------------------------------------------------------------------------  Chemistries  Recent Labs  Lab 05/31/17 1442 06/01/17 1333  NA 136  --   K 6.2*  --   CL 100*  --   CO2 14*  --   GLUCOSE 94  --   BUN 103*  --   CREATININE 18.95* 19.61*  CALCIUM 7.3*  --   AST 18  --   ALT 11*  --   ALKPHOS 49  --   BILITOT 0.9  --    ------------------------------------------------------------------------------------------------------------------  Cardiac Enzymes No results for input(s): TROPONINI in the last 168 hours. ------------------------------------------------------------------------------------------------------------------  RADIOLOGY:  No results found.  EKG:   Orders placed or performed  during the hospital encounter of 04/24/17  . ED EKG  . ED EKG  . EKG 12-Lead  . EKG 12-Lead    IMPRESSION AND PLAN:  #1 severe hyperkalemia secondary to missed hemodialysis, patient is getting emergency hemodialysis, monitor off unit telemetry, repeat potassium levels after hemodialysis today, patient is supposed to get hemodialysis session today, tomorrow, day after tomorrow for consecutive 3 sessions of dialysis. #2 severe abdominal pain, nausea secondary to uremic symptoms. 3.  History of diastolic heart failure, pacemaker. Chronic abdominal pain, Monica status: Patient requires paracentesis every week. 4.  Chronic diarrhea: Check stool for C. difficile, continue Tylenol for abdominal pain,. CT abdomen without contrast to evaluate for diverticulitis/diverticulosis.  All the records are reviewed and case discussed with ED provider. Management plans discussed with the patient, family and they are in agreement.  CODE STATUS:full  TOTAL TIME TAKING CARE  OF THIS PATIENT: 55 minutes.    Katha HammingSnehalatha Terriah Reggio M.D on 06/01/2017 at 4:46 PM  Between 7am to 6pm - Pager - [phone]  After 6pm go to [email or web address] - password [email or web address] [facility]  [facility]  Office  [phone]  CC: Primary care physician; Corky DownsMasoud, Javed, MD  Note: This dictation was prepared with Dragon dictation along with smaller phrase technology. Any transcriptional errors that result from this process are unintentional.

## 2017-06-01 NOTE — Progress Notes (Signed)
Pre HD assessment  

## 2017-06-01 NOTE — Progress Notes (Signed)
Chaplain responded to a RR page for pt in Dialysis. Pt had been stabilized when CH arrived. CH to follow up as needed to determine other concerns and needs pt might have.   06/01/17 1900  Clinical Encounter Type  Visited With Patient;Health care provider  Visit Type Initial;Code  Referral From Nurse  Consult/Referral To Chaplain  Spiritual Encounters  Spiritual Needs Other (Comment)

## 2017-06-01 NOTE — Progress Notes (Signed)
HD tx start 

## 2017-06-01 NOTE — Progress Notes (Signed)
[facility]  ROUNDING NOTE   Subjective:   Ms. Melissa Bradshaw admitted to [facility] on 06/01/2017 for Hyperkalemia uremia ascites   Patient has missed 6 outpatient hemodialysis treatments. She has been having diarrhea, abdominal pain and nausea preventing her from coming to dialysis. She came to the ED yesterday but left because she was having diarrhea.   Direct admission this morning. Dialysis for today.   Objective:  Vital signs in last 24 hours:  Temp:  [98.1 F (36.7 C)] 98.1 F (36.7 C) (11/29 1246) Pulse Rate:  [60-66] 60 (11/29 1422) Resp:  [15] 15 (11/29 1246) BP: (141-144)/(62-69) 141/62 (11/29 1422) SpO2:  [100 %] 100 % (11/29 1246) Weight:  [50.8 kg (111 lb 14.4 oz)] 50.8 kg (111 lb 14.4 oz) (11/29 1248)  Weight change:  Filed Weights   06/01/17 1248  Weight: 50.8 kg (111 lb 14.4 oz)    Intake/Output: No intake/output data recorded.   Intake/Output this shift:  No intake/output data recorded.  Physical Exam: General: NAD, laying in bed  Head: Normocephalic, atraumatic. Moist oral mucosal membranes  Eyes: Anicteric, PERRL  Neck: Supple, trachea midline  Lungs:  Clear to auscultation  Heart: Regular rate and rhythm  Abdomen:  +distended, + tender   Extremities:  trace peripheral edema.  Neurologic: Nonfocal, moving all four extremities  Skin: No lesions  Access: Left AVG    Basic Metabolic Panel: Recent Labs  Lab 05/31/17 1442 06/01/17 1333  NA 136  --   K 6.2*  --   CL 100*  --   CO2 14*  --   GLUCOSE 94  --   BUN 103*  --   CREATININE 18.95* 19.61*  CALCIUM 7.3*  --     Liver Function Tests: Recent Labs  Lab 05/31/17 1442  AST 18  ALT 11*  ALKPHOS 49  BILITOT 0.9  PROT 5.7*  ALBUMIN 2.3*   Recent Labs  Lab 05/31/17 1442  LIPASE 41   No results for input(s): AMMONIA in the last 168 hours.  CBC: Recent Labs  Lab 05/31/17 1442 06/01/17 1333  WBC 6.3 6.3  HGB 14.5 13.8  HCT 43.3 41.3  MCV 84.6 85.9  PLT 368  352    Cardiac Enzymes: No results for input(s): CKTOTAL, CKMB, CKMBINDEX, TROPONINI in the last 168 hours.  BNP: Invalid input(s): POCBNP  CBG: No results for input(s): GLUCAP in the last 168 hours.  Microbiology: Results for orders placed or performed during the hospital encounter of 06/01/17  MRSA PCR Screening     Status: None   Collection Time: 06/01/17 12:24 PM  Result Value Ref Range Status   MRSA by PCR NEGATIVE NEGATIVE Final    Comment:        The GeneXpert MRSA Assay (FDA approved for NASAL specimens only), is one component of a comprehensive MRSA colonization surveillance program. It is not intended to diagnose MRSA infection nor to guide or monitor treatment for MRSA infections.     Coagulation Studies: No results for input(s): LABPROT, INR in the last 72 hours.  Urinalysis: No results for input(s): COLORURINE, LABSPEC, PHURINE, GLUCOSEU, HGBUR, BILIRUBINUR, KETONESUR, PROTEINUR, UROBILINOGEN, NITRITE, LEUKOCYTESUR in the last 72 hours.  Invalid input(s): APPERANCEUR    Imaging: No results found.   Medications:    . aspirin EC  81 mg Oral Daily  . docusate sodium  100 mg Oral BID  . furosemide  20 mg Oral Daily  . heparin  5,000 Units Subcutaneous Q8H   acetaminophen **  OR** acetaminophen, bisacodyl, HYDROcodone-acetaminophen, ondansetron **OR** ondansetron (ZOFRAN) IV, traZODone  Assessment/ Plan:  Ms. Melissa Bradshaw is a 66 y.o. black female with end stage renal disease on hemodialysis, anemia, hypertension, polycystic kidney disease, history of pacemaker, history of TIA, vertebral artery aneurysm status post coiling  1. End-stage renal disease: with hyperkalemia, metabolic acidosis and uremic.  - restart hemodialysis. Slow therapy. Three treatments in a row.  - Outpatient planning: [address]. Resume old chair time.   2. Hypertension: blood pressure at goal.  - history of difficult to control. Home regimen of amlodipine,  furosemide, and labetolol Currently only on furosemide.   3. Anemia chronic kidney disease: hemoglobin 13.8 - hold EPO  4. Secondary hyperparathyroidism: with hyperphosphatemia and hypocalcemia. Hold cinacalcet.  - calcium acetate 2 tabs with meals.     LOS: 0 Melissa Bradshaw 11/29/20184:17 PM

## 2017-06-02 ENCOUNTER — Inpatient Hospital Stay: Payer: Medicare Other

## 2017-06-02 DIAGNOSIS — Z992 Dependence on renal dialysis: Secondary | ICD-10-CM | POA: Diagnosis not present

## 2017-06-02 DIAGNOSIS — N186 End stage renal disease: Secondary | ICD-10-CM | POA: Diagnosis not present

## 2017-06-02 LAB — CBC
HCT: 39.8 % (ref 35.0–47.0)
Hemoglobin: 13.7 g/dL (ref 12.0–16.0)
MCH: 29 pg (ref 26.0–34.0)
MCHC: 34.5 g/dL (ref 32.0–36.0)
MCV: 83.9 fL (ref 80.0–100.0)
PLATELETS: 327 10*3/uL (ref 150–440)
RBC: 4.74 MIL/uL (ref 3.80–5.20)
RDW: 15.7 % — AB (ref 11.5–14.5)
WBC: 5.7 10*3/uL (ref 3.6–11.0)

## 2017-06-02 LAB — GASTROINTESTINAL PANEL BY PCR, STOOL (REPLACES STOOL CULTURE)

## 2017-06-02 LAB — GLUCOSE, CAPILLARY: Glucose-Capillary: 108 mg/dL — ABNORMAL HIGH (ref 65–99)

## 2017-06-02 LAB — BASIC METABOLIC PANEL
ANION GAP: 16 — AB (ref 5–15)
BUN: 60 mg/dL — ABNORMAL HIGH (ref 6–20)
CO2: 21 mmol/L — ABNORMAL LOW (ref 22–32)
Calcium: 7 mg/dL — ABNORMAL LOW (ref 8.9–10.3)
Chloride: 99 mmol/L — ABNORMAL LOW (ref 101–111)
Creatinine, Ser: 12.85 mg/dL — ABNORMAL HIGH (ref 0.44–1.00)
GFR, EST AFRICAN AMERICAN: 3 mL/min — AB (ref >60)
GFR, EST NON AFRICAN AMERICAN: 3 mL/min — AB (ref >60)
Glucose, Bld: 84 mg/dL (ref 65–99)
Potassium: 4.5 mmol/L (ref 3.5–5.1)
Sodium: 136 mmol/L (ref 135–145)

## 2017-06-02 LAB — C DIFFICILE QUICK SCREEN W PCR REFLEX
C DIFFICLE (CDIFF) ANTIGEN: POSITIVE — AB
C Diff toxin: NEGATIVE

## 2017-06-02 LAB — CLOSTRIDIUM DIFFICILE BY PCR: Toxigenic C. Difficile by PCR: POSITIVE — AB

## 2017-06-02 MED ORDER — ALBUMIN HUMAN 25 % IV SOLN
25.0000 g | Freq: Once | INTRAVENOUS | Status: DC
  Filled 2017-06-02: qty 100

## 2017-06-02 NOTE — Care Management Important Message (Signed)
Important Message  Patient Details  Name: Melissa Bradshaw MRN: [id number] Date of Birth: 03/13/1951   Medicare Important Message Given:  Yes    Chapman FitchBOWEN, Barnard Sharps T, RN 06/02/2017, 10:17 AM

## 2017-06-02 NOTE — Progress Notes (Signed)
Pre HD assessment  

## 2017-06-02 NOTE — Progress Notes (Signed)
Per Dr. Nemiah CommanderKalisetti okay to discontinue albumin as pt did not have paracentesis. Also okay per MD to hold off on aspirin that was to be given this AM.

## 2017-06-02 NOTE — Progress Notes (Signed)
[facility] - [place] at [facility]   PATIENT NAME: Melissa PortelaCaroline Bradshaw    MR#:  [id number]  DATE OF BIRTH:  11/19/1950  SUBJECTIVE:  CHIEF COMPLAINT:  No chief complaint on file.  - Patient was independent at baseline, missed hemodialysis for almost 2 weeks due to poor health and was noted to be uremic yesterday at the dialysis center and was admitted to hospital. -Had dialysis yesterday. For dialysis again today. Also requiring frequent paracentesis for ascites. -Complains of abdominal pain  REVIEW OF SYSTEMS:  Review of Systems  Constitutional: Positive for malaise/fatigue. Negative for chills and fever.  HENT: Negative for congestion, ear discharge, hearing loss and nosebleeds.   Eyes: Negative for blurred vision and double vision.  Respiratory: Negative for cough, shortness of breath and wheezing.   Cardiovascular: Negative for chest pain, palpitations and leg swelling.  Gastrointestinal: Positive for abdominal pain. Negative for constipation, diarrhea, nausea and vomiting.  Genitourinary: Negative for dysuria.  Musculoskeletal: Positive for myalgias.  Neurological: Positive for weakness. Negative for dizziness, speech change, focal weakness, seizures and headaches.  Psychiatric/Behavioral: Negative for depression.    DRUG ALLERGIES:   Allergies  Allergen Reactions  . Hydralazine Hcl Other (See Comments)    Makes pt jittery, nervous, messes with vision   . Iodine Itching and Rash    Unknown reaction  . Ivp Dye [Iodinated Diagnostic Agents] Rash    VITALS:  Blood pressure (!) 149/78, pulse 79, temperature 98.2 F (36.8 C), temperature source Oral, resp. rate 18, height 5' 5.5" (1.664 m), weight 53.2 kg (117 lb 3.2 oz), SpO2 99 %.  PHYSICAL EXAMINATION:  Physical Exam  GENERAL:  66 y.o.-year-old ill nourished patient lying in the bed with no acute distress.  EYES: Pupils equal, round, reactive to light and accommodation. No scleral icterus. Extraocular  muscles intact.  HEENT: Head atraumatic, normocephalic. Oropharynx and nasopharynx clear.  NECK:  Supple, no jugular venous distention. No thyroid enlargement, no tenderness.  LUNGS: Normal breath sounds bilaterally, no wheezing, rales,rhonchi or crepitation. No use of accessory muscles of respiration. Decreased at the bases CARDIOVASCULAR: S1, S2 normal. No murmurs, rubs, or gallops.  ABDOMEN: Distended abdomen, firm to touch and also has a supraumbilical hernia. Generalized tenderness with no guarding or rigidity. Bowel sounds present. No organomegaly or mass.  EXTREMITIES: No pedal edema, cyanosis, or clubbing.  NEUROLOGIC: Cranial nerves II through XII are intact. Muscle strength 5/5 in all extremities. Sensation intact. Gait not checked. Following commands PSYCHIATRIC: The patient is alert and oriented x 3.  SKIN: No obvious rash, lesion, or ulcer.    LABORATORY PANEL:   CBC Recent Labs  Lab 06/02/17 0410  WBC 5.7  HGB 13.7  HCT 39.8  PLT 327   ------------------------------------------------------------------------------------------------------------------  Chemistries  Recent Labs  Lab 05/31/17 1442  06/02/17 0410  NA 136  --  136  K 6.2*   < > 4.5  CL 100*  --  99*  CO2 14*  --  21*  GLUCOSE 94  --  84  BUN 103*  --  60*  CREATININE 18.95*   < > 12.85*  CALCIUM 7.3*  --  7.0*  AST 18  --   --   ALT 11*  --   --   ALKPHOS 49  --   --   BILITOT 0.9  --   --    < > = values in this interval not displayed.   ------------------------------------------------------------------------------------------------------------------  Cardiac Enzymes No results for input(s): TROPONINI in  the last 168 hours. ------------------------------------------------------------------------------------------------------------------  RADIOLOGY:  Ct Abdomen Pelvis Wo Contrast  Result Date: 06/01/2017 CLINICAL DATA:  Diarrhea with abdominal pain and nausea EXAM: CT ABDOMEN AND PELVIS  WITHOUT CONTRAST TECHNIQUE: Multidetector CT imaging of the abdomen and pelvis was performed following the standard protocol without IV contrast. COMPARISON:  06/05/2016, 06/06/2015 FINDINGS: Lower chest: Lung bases demonstrate no acute consolidation or pleural effusion. Partially visualized cardiac pacing leads. Trace pericardial effusion. Hepatobiliary: Innumerable cysts throughout the liver parenchyma. Liver is enlarged. Some of the cysts demonstrate increased density. For example 9 cm anterior cyst, series 2, image number 41. Left lobe cyst measuring 4.9 cm. Gallbladder poorly visualized due to the numerous cysts and ascites in the abdomen. No biliary dilatation. Pancreas: Unremarkable. No pancreatic ductal dilatation or surrounding inflammatory changes. Spleen: Normal in size without focal abnormality. Adrenals/Urinary Tract: Difficult visualization of right adrenal gland. Left adrenal gland appears within normal limits. Enlarged polycystic kidneys bilaterally with scattered calcifications and areas of increased density as before. The bladder is unremarkable. Stomach/Bowel: The stomach is nonenlarged. No dilated small bowel. No colon wall thickening. Normal appendix. Vascular/Lymphatic: Extensive atherosclerotic vascular disease. No significantly enlarged lymph nodes Reproductive: Uterus and bilateral adnexa are unremarkable. Other: Moderate to large volume of ascites in the abdomen and pelvis. No free air. Fluid in the umbilical region. Musculoskeletal: Degenerative changes. No acute or suspicious lesion IMPRESSION: 1. Negative for bowel obstruction or bowel wall thickening. Normal appendix. 2. Polycystic kidneys as before. Polycystic liver. Several of the liver cysts demonstrate increased internal density suggesting hemorrhage or proteinaceous debris. 3. Moderate to large volume of ascites. Electronically Signed   By: Jasmine PangKim  Fujinaga M.D.   On: 06/01/2017 22:19    EKG:   Orders placed or performed during  the hospital encounter of 04/24/17  . ED EKG  . ED EKG  . EKG 12-Lead  . EKG 12-Lead    ASSESSMENT AND PLAN:   66 year old female lives independent at baseline with history of hypertension, cerebral aneurysm, end-stage renal disease on hemodialysis, gastritis, history of cardiac pacemaker presents to hospital secondary to uremic symptoms  #1 uremic encephalopathy-noted to be encephalopathic at dialysis center yesterday. Abdomen missed dialysis for almost 2 weeks due to poor health -Appreciate nephrology consult. Had dialysis yesterday, will have dialysis again today and tomorrow. -Also presented with hyperkalemia which has been corrected at this time. -Social worker consult to help with transportation to dialysis.  #2 recurrent ascites-secondary to polycystic liver disease. -Almost requiring weekly paracentesis. Had fluid tap done 3 days ago, abdomen is still distended and complaints of abdominal pain. -We will order a therapeutic and diagnostic paracentesis at this time.  #3 end-stage renal disease-secondary to polycystic kidney disease. Missed hemodialysis lately. -Started on dialysis again yesterday. Nephrology consult is appreciated -[facility] and was considered not a candidate for renal transplant  #4 diarrhea-has resolved at this time. DC isolation and stool studies  #5 DVT prophylaxis-on subcutaneous heparin   All the records are reviewed and case discussed with Care Management/Social Workerr. Management plans discussed with the patient, family and they are in agreement.  CODE STATUS: Full Code  TOTAL TIME TAKING CARE OF THIS PATIENT: 37 minutes.   POSSIBLE D/C IN 2-3 DAYS, DEPENDING ON CLINICAL CONDITION.   Yannet Rincon M.D on 06/02/2017 at 11:04 AM  Between 7am to 6pm - Pager - [phone]  After 6pm go to [email or web address] - Social research officer, governmentpassword [email or web address]  [facility]  Office  [phone]  CC: Primary care  physician; Corky Downs, MD

## 2017-06-02 NOTE — Progress Notes (Signed)
HD tx end. Pt bent arm during tx and infiltrated venous access. HD ended 15 minutes following tx initiation. MD notified, pt will resume tx tomorrow.

## 2017-06-02 NOTE — Progress Notes (Signed)
HD tx start 

## 2017-06-02 NOTE — Progress Notes (Signed)
Post HD assessment  

## 2017-06-02 NOTE — Progress Notes (Signed)
Per Dr. Nemiah CommanderKalisetti okay to give dialysis now and pt can wait until Monday for paracentesis.

## 2017-06-02 NOTE — Progress Notes (Signed)
[facility]  ROUNDING NOTE   Subjective:   Hemodialysis yesterday. Had hypotensive episode during treatment.   Given hydrocodone-acetaminophen during treatment.   No UF.   Second dialysis treatment for later today.  Report no more diarrhea and no more abdominal pain. States ondansetron is working.   Objective:  Vital signs in last 24 hours:  Temp:  [98 F (36.7 C)-98.4 F (36.9 C)] 98.2 F (36.8 C) (11/30 0433) Pulse Rate:  [60-103] 79 (11/30 0433) Resp:  [10-20] 18 (11/30 0433) BP: (76-151)/(50-81) 149/78 (11/30 0433) SpO2:  [96 %-100 %] 99 % (11/30 0433) Weight:  [50.8 kg (111 lb 14.4 oz)-55.3 kg (121 lb 14.6 oz)] 53.2 kg (117 lb 3.2 oz) (11/30 0500)  Weight change:  Filed Weights   06/01/17 1515 06/01/17 1922 06/02/17 0500  Weight: 53 kg (116 lb 13.5 oz) 55.3 kg (121 lb 14.6 oz) 53.2 kg (117 lb 3.2 oz)    Intake/Output: I/O last 3 completed shifts: In: 360 [P.O.:360] Out: 140 [Other:140]   Intake/Output this shift:  No intake/output data recorded.  Physical Exam: General: NAD, laying in bed  Head: Normocephalic, atraumatic. Moist oral mucosal membranes  Eyes: Anicteric, PERRL  Neck: Supple, trachea midline  Lungs:  Clear to auscultation  Heart: Regular rate and rhythm  Abdomen:  soft  Extremities:  trace peripheral edema.  Neurologic: Nonfocal, moving all four extremities  Skin: No lesions  Access: Left AVG    Basic Metabolic Panel: Recent Labs  Lab 05/31/17 1442 06/01/17 1333 06/01/17 1854 06/02/17 0410  NA 136  --   --  136  K 6.2*  --  3.4* 4.5  CL 100*  --   --  99*  CO2 14*  --   --  21*  GLUCOSE 94  --   --  84  BUN 103*  --   --  60*  CREATININE 18.95* 19.61*  --  12.85*  CALCIUM 7.3*  --   --  7.0*    Liver Function Tests: Recent Labs  Lab 05/31/17 1442  AST 18  ALT 11*  ALKPHOS 49  BILITOT 0.9  PROT 5.7*  ALBUMIN 2.3*   Recent Labs  Lab 05/31/17 1442  LIPASE 41   No results for input(s): AMMONIA in the  last 168 hours.  CBC: Recent Labs  Lab 05/31/17 1442 06/01/17 1333 06/02/17 0410  WBC 6.3 6.3 5.7  HGB 14.5 13.8 13.7  HCT 43.3 41.3 39.8  MCV 84.6 85.9 83.9  PLT 368 352 327    Cardiac Enzymes: No results for input(s): CKTOTAL, CKMB, CKMBINDEX, TROPONINI in the last 168 hours.  BNP: Invalid input(s): POCBNP  CBG: Recent Labs  Lab 06/02/17 0814  GLUCAP 108*    Microbiology: Results for orders placed or performed during the hospital encounter of 06/01/17  MRSA PCR Screening     Status: None   Collection Time: 06/01/17 12:24 PM  Result Value Ref Range Status   MRSA by PCR NEGATIVE NEGATIVE Final    Comment:        The GeneXpert MRSA Assay (FDA approved for NASAL specimens only), is one component of a comprehensive MRSA colonization surveillance program. It is not intended to diagnose MRSA infection nor to guide or monitor treatment for MRSA infections.     Coagulation Studies: No results for input(s): LABPROT, INR in the last 72 hours.  Urinalysis: No results for input(s): COLORURINE, LABSPEC, PHURINE, GLUCOSEU, HGBUR, BILIRUBINUR, KETONESUR, PROTEINUR, UROBILINOGEN, NITRITE, LEUKOCYTESUR in the last 72 hours.  Invalid  input(s): APPERANCEUR    Imaging: Ct Abdomen Pelvis Wo Contrast  Result Date: 06/01/2017 CLINICAL DATA:  Diarrhea with abdominal pain and nausea EXAM: CT ABDOMEN AND PELVIS WITHOUT CONTRAST TECHNIQUE: Multidetector CT imaging of the abdomen and pelvis was performed following the standard protocol without IV contrast. COMPARISON:  06/05/2016, 06/06/2015 FINDINGS: Lower chest: Lung bases demonstrate no acute consolidation or pleural effusion. Partially visualized cardiac pacing leads. Trace pericardial effusion. Hepatobiliary: Innumerable cysts throughout the liver parenchyma. Liver is enlarged. Some of the cysts demonstrate increased density. For example 9 cm anterior cyst, series 2, image number 41. Left lobe cyst measuring 4.9 cm. Gallbladder  poorly visualized due to the numerous cysts and ascites in the abdomen. No biliary dilatation. Pancreas: Unremarkable. No pancreatic ductal dilatation or surrounding inflammatory changes. Spleen: Normal in size without focal abnormality. Adrenals/Urinary Tract: Difficult visualization of right adrenal gland. Left adrenal gland appears within normal limits. Enlarged polycystic kidneys bilaterally with scattered calcifications and areas of increased density as before. The bladder is unremarkable. Stomach/Bowel: The stomach is nonenlarged. No dilated small bowel. No colon wall thickening. Normal appendix. Vascular/Lymphatic: Extensive atherosclerotic vascular disease. No significantly enlarged lymph nodes Reproductive: Uterus and bilateral adnexa are unremarkable. Other: Moderate to large volume of ascites in the abdomen and pelvis. No free air. Fluid in the umbilical region. Musculoskeletal: Degenerative changes. No acute or suspicious lesion IMPRESSION: 1. Negative for bowel obstruction or bowel wall thickening. Normal appendix. 2. Polycystic kidneys as before. Polycystic liver. Several of the liver cysts demonstrate increased internal density suggesting hemorrhage or proteinaceous debris. 3. Moderate to large volume of ascites. Electronically Signed   By: Jasmine PangKim  Fujinaga M.D.   On: 06/01/2017 22:19     Medications:   . albumin human     . aspirin EC  81 mg Oral Daily  . docusate sodium  100 mg Oral BID  . heparin  5,000 Units Subcutaneous Q8H   acetaminophen **OR** acetaminophen, bisacodyl, HYDROcodone-acetaminophen, ondansetron **OR** ondansetron (ZOFRAN) IV, traZODone  Assessment/ Plan:  Ms. Tristan SchroederCaroline J Powley is a 66 y.o. black female with end stage renal disease on hemodialysis, anemia, hypertension, polycystic kidney disease, history of pacemaker, history of TIA, vertebral artery aneurysm status post coiling  1. End-stage renal disease: with hyperkalemia, metabolic acidosis and uremic.  -  restarted hemodialysis. Second Treatment today. Orders prepared.  - Outpatient planning: [address]. Resume old chair time.   2. Hypertension: blood pressure at goal.  - history of difficult to control. Home regimen of amlodipine, furosemide, and labetolol Currently only on furosemide.   3. Anemia chronic kidney disease: hemoglobin 13.7 - hold EPO  4. Secondary hyperparathyroidism: with hyperphosphatemia and hypocalcemia. Hold cinacalcet due to hypocalcemia - calcium acetate 2 tabs with meals.     LOS: 1 Blanca Thornton 11/30/201810:55 AM

## 2017-06-02 NOTE — Progress Notes (Signed)
Pt was not able to receive dialysis today. Dr. Wynelle LinkKolluru placed order for paracentesis as pt now has time to have it completed today.

## 2017-06-02 NOTE — Care Management (Signed)
Melissa ChyleAmanda Morris HD liaison aware of admission

## 2017-06-02 NOTE — Progress Notes (Signed)
Patient brought to radiology department for paracentesis.  Patient had a paracentesis on Tuesday with 6 L removed.  Limited US Abdomen shows small amount of re-accumulated fluid.  Patient denies symptoms of abdominal distention.  Requests no paracentesis if unlikely to change symptoms of nausea and pain. Discussed with Dr. Wynelle LinkKolluru. Current symptoms likely related to overall condition and renal/dialysis status.  Paracentesis unlikely to provide therapeutic benefit at this time.  No procedure performed.   Loyce DysKacie Matthews, MS RD PA-C 2:24 PM

## 2017-06-03 LAB — BASIC METABOLIC PANEL
Anion gap: 20 — ABNORMAL HIGH (ref 5–15)
BUN: 66 mg/dL — AB (ref 6–20)
CALCIUM: 7.4 mg/dL — AB (ref 8.9–10.3)
CO2: 19 mmol/L — AB (ref 22–32)
CREATININE: 14.38 mg/dL — AB (ref 0.44–1.00)
Chloride: 96 mmol/L — ABNORMAL LOW (ref 101–111)
GFR calc non Af Amer: 2 mL/min — ABNORMAL LOW (ref >60)
GFR, EST AFRICAN AMERICAN: 3 mL/min — AB (ref >60)
GLUCOSE: 109 mg/dL — AB (ref 65–99)
Potassium: 5.2 mmol/L — ABNORMAL HIGH (ref 3.5–5.1)
Sodium: 135 mmol/L (ref 135–145)

## 2017-06-03 LAB — GLUCOSE, CAPILLARY: Glucose-Capillary: 90 mg/dL (ref 65–99)

## 2017-06-03 MED ORDER — ONDANSETRON 4 MG PO TBDP: 4.0000 mg | ORAL_TABLET | Freq: Three times a day (TID) | ORAL | 0 refills | Status: AC | PRN

## 2017-06-03 MED ORDER — DOCUSATE SODIUM 100 MG PO CAPS: 100.0000 mg | ORAL_CAPSULE | Freq: Two times a day (BID) | ORAL | 0 refills | Status: AC

## 2017-06-03 NOTE — Discharge Summary (Signed)
[facility] [place] [facility]   PATIENT NAME: Melissa PortelaCaroline Bradshaw    MR#:  [id number]  DATE OF BIRTH:  10/20/1950  DATE OF ADMISSION:  06/01/2017   ADMITTING PHYSICIAN: Adrian SaranSital Mody, MD  DATE OF DISCHARGE: 06/03/2017  PRIMARY CARE PHYSICIAN: Corky DownsMasoud, Javed, MD   ADMISSION DIAGNOSIS:   Hyperkalemia uremia ascites  DISCHARGE DIAGNOSIS:   Active Problems:   Hyperkalemia   SECONDARY DIAGNOSIS:   Past Medical History:  Diagnosis Date  . Anemia   . Aneurysm (HCC)    [place]  . Arthritis   . Cardiac pacemaker   . CHF (congestive heart failure) (HCC)   . Chronic kidney disease    ESRD  . Constipation   . Coronary artery disease   . Dialysis patient (HCC)    Tues, Thurs, Sat  . Gastric ulcer   . Glaucoma (increased eye pressure)   . Headache   . Hypertension   . Presence of permanent cardiac pacemaker     HOSPITAL COURSE:   66 year old female lives independent at baseline with history of hypertension, cerebral aneurysm, end-stage renal disease on hemodialysis, gastritis, history of cardiac pacemaker presents to hospital secondary to uremic symptoms  #1 uremic encephalopathy-noted to be encephalopathic at dialysis center on admission. - missed dialysis for almost 2 weeks due to poor health -Appreciate nephrology consult. Had dialysis on admission with significant improvement in mental status. -Dialysis was attempted yesterday, however due to fistula issues he was supposed to be scheduled to be done today. However patient refused her dialysis today and wanted to get as an outpatient on Monday per her regular schedule. -Also presented with hyperkalemia which improved with dialysis now.  #2 recurrent ascites-secondary to polycystic liver disease. -Almost requiring weekly paracentesis. Had fluid tap done 3 days ago, abdomen is still distended and complaints of abdominal pain. -Paracentesis requested, however after son did not  show significant amount to be tapped. Outpatient follow-up.  #3 end-stage renal disease-secondary to polycystic kidney disease. Missed hemodialysis lately. -Started on dialysis again. Nephrology consult is appreciated -[facility] and was considered not a candidate for renal transplant -Refused dialysis today and very adamant about going home. She said she will come to regular dialysis on Monday as outpatient  #4 diarrhea-has resolved at this time. DC isolation and stool studies  Being discharged today.   DISCHARGE CONDITIONS:   Guarded  CONSULTS OBTAINED:   Treatment Team:  Lamont DowdyKolluru, Sarath, MD  DRUG ALLERGIES:   Allergies  Allergen Reactions  . Hydralazine Hcl Other (See Comments)    Makes pt jittery, nervous, messes with vision   . Iodine Itching and Rash    Unknown reaction  . Ivp Dye [Iodinated Diagnostic Agents] Rash   DISCHARGE MEDICATIONS:   Allergies as of 06/03/2017      Reactions   Hydralazine Hcl Other (See Comments)   Makes pt jittery, nervous, messes with vision    Iodine Itching, Rash   Unknown reaction   Ivp Dye [iodinated Diagnostic Agents] Rash      Medication List    STOP taking these medications   furosemide 20 MG tablet Commonly known as:  LASIX   SENSIPAR PO     TAKE these medications   aspirin 81 MG tablet Take 81 mg by mouth daily.   calcium acetate 667 MG capsule Commonly known as:  PHOSLO Take 2,001 mg by mouth 3 (three) times daily with meals.   docusate sodium 100 MG  capsule Commonly known as:  COLACE Take 1 capsule (100 mg total) by mouth 2 (two) times daily.   latanoprost 0.005 % ophthalmic solution Commonly known as:  XALATAN Place 1 drop into both eyes at bedtime.   lidocaine-prilocaine cream Commonly known as:  EMLA   ondansetron 4 MG disintegrating tablet Commonly known as:  ZOFRAN ODT Take 1 tablet (4 mg total) by mouth every 8 (eight) hours as needed for nausea or vomiting.   PROSOL 20  % Soln        DISCHARGE INSTRUCTIONS:   1. For dialysis on Monday per schedule 2. PCP f/u in 1 week  DIET:   Renal diet  ACTIVITY:   Activity as tolerated  OXYGEN:   Home Oxygen: No.  Oxygen Delivery: room air  DISCHARGE LOCATION:   home   If you experience worsening of your admission symptoms, develop shortness of breath, life threatening emergency, suicidal or homicidal thoughts you must seek medical attention immediately by calling 911 or calling your MD immediately  if symptoms less severe.  You Must read complete instructions/literature along with all the possible adverse reactions/side effects for all the Medicines you take and that have been prescribed to you. Take any new Medicines after you have completely understood and accpet all the possible adverse reactions/side effects.   Please note  You were cared for by a hospitalist during your hospital stay. If you have any questions about your discharge medications or the care you received while you were in the hospital after you are discharged, you can call the unit and asked to speak with the hospitalist on call if the hospitalist that took care of you is not available. Once you are discharged, your primary care physician will handle any further medical issues. Please note that NO REFILLS for any discharge medications will be authorized once you are discharged, as it is imperative that you return to your primary care physician (or establish a relationship with a primary care physician if you do not have one) for your aftercare needs so that they can reassess your need for medications and monitor your lab values.    On the day of Discharge:  VITAL SIGNS:   Blood pressure (!) 128/59, pulse 70, temperature 98.1 F (36.7 C), temperature source Oral, resp. rate 20, height 5' 5.5" (1.664 m), weight 52.3 kg (115 lb 4.8 oz), SpO2 95 %.  PHYSICAL EXAMINATION:    GENERAL:  66 y.o.-year-old ill nourished patient lying in the  bed with no acute distress.  EYES: Pupils equal, round, reactive to light and accommodation. No scleral icterus. Extraocular muscles intact.  HEENT: Head atraumatic, normocephalic. Oropharynx and nasopharynx clear.  NECK:  Supple, no jugular venous distention. No thyroid enlargement, no tenderness.  LUNGS: Normal breath sounds bilaterally, no wheezing, rales,rhonchi or crepitation. No use of accessory muscles of respiration. Decreased at the bases CARDIOVASCULAR: S1, S2 normal. No murmurs, rubs, or gallops.  ABDOMEN: Distended abdomen, soft to touch and also has a supraumbilical hernia. Generalized tenderness with no guarding or rigidity. Bowel sounds present. No organomegaly or mass.  EXTREMITIES: No pedal edema, cyanosis, or clubbing.  NEUROLOGIC: Cranial nerves II through XII are intact. Muscle strength 5/5 in all extremities. Sensation intact. Gait not checked. Following commands PSYCHIATRIC: The patient is alert and oriented x 3.  SKIN: No obvious rash, lesion, or ulcer  .   DATA REVIEW:   CBC Recent Labs  Lab 06/02/17 0410  WBC 5.7  HGB 13.7  HCT 39.8  PLT  327    Chemistries  Recent Labs  Lab 05/31/17 1442  06/03/17 0521  NA 136   < > 135  K 6.2*   < > 5.2*  CL 100*   < > 96*  CO2 14*   < > 19*  GLUCOSE 94   < > 109*  BUN 103*   < > 66*  CREATININE 18.95*   < > 14.38*  CALCIUM 7.3*   < > 7.4*  AST 18  --   --   ALT 11*  --   --   ALKPHOS 49  --   --   BILITOT 0.9  --   --    < > = values in this interval not displayed.     Microbiology Results  Results for orders placed or performed during the hospital encounter of 06/01/17  MRSA PCR Screening     Status: None   Collection Time: 06/01/17 12:24 PM  Result Value Ref Range Status   MRSA by PCR NEGATIVE NEGATIVE Final    Comment:        The GeneXpert MRSA Assay (FDA approved for NASAL specimens only), is one component of a comprehensive MRSA colonization surveillance program. It is not intended to diagnose  MRSA infection nor to guide or monitor treatment for MRSA infections.   C difficile quick scan w PCR reflex     Status: Abnormal   Collection Time: 06/01/17 12:51 PM  Result Value Ref Range Status   C Diff antigen POSITIVE (A) NEGATIVE Final   C Diff toxin NEGATIVE NEGATIVE Final   C Diff interpretation Results are indeterminate. See PCR results.  Final  Clostridium Difficile by PCR     Status: Abnormal   Collection Time: 06/01/17 12:51 PM  Result Value Ref Range Status   Toxigenic C Difficile by pcr POSITIVE (A) NEGATIVE Final    Comment: Positive for toxigenic C. difficile with little to no toxin production. Only treat if clinical presentation suggests symptomatic illness.  Gastrointestinal Panel by PCR , Stool     Status: None   Collection Time: 06/02/17 12:51 PM  Result Value Ref Range Status   Campylobacter species NOT DETECTED NOT DETECTED Final   Plesimonas shigelloides NOT DETECTED NOT DETECTED Final   Salmonella species NOT DETECTED NOT DETECTED Final   Yersinia enterocolitica NOT DETECTED NOT DETECTED Final   Vibrio species NOT DETECTED NOT DETECTED Final   Vibrio cholerae NOT DETECTED NOT DETECTED Final   Enteroaggregative E coli (EAEC) NOT DETECTED NOT DETECTED Final   Enteropathogenic E coli (EPEC) NOT DETECTED NOT DETECTED Final   Enterotoxigenic E coli (ETEC) NOT DETECTED NOT DETECTED Final   Shiga like toxin producing E coli (STEC) NOT DETECTED NOT DETECTED Final   Shigella/Enteroinvasive E coli (EIEC) NOT DETECTED NOT DETECTED Final   Cryptosporidium NOT DETECTED NOT DETECTED Final   Cyclospora cayetanensis NOT DETECTED NOT DETECTED Final   Entamoeba histolytica NOT DETECTED NOT DETECTED Final   Giardia lamblia NOT DETECTED NOT DETECTED Final   Adenovirus F40/41 NOT DETECTED NOT DETECTED Final   Astrovirus NOT DETECTED NOT DETECTED Final   Norovirus GI/GII NOT DETECTED NOT DETECTED Final   Rotavirus A NOT DETECTED NOT DETECTED Final   Sapovirus (I, II, IV, and  V) NOT DETECTED NOT DETECTED Final    RADIOLOGY:  US Abdomen Limited  Result Date: 06/02/2017 CLINICAL DATA:  Recurrent ascites. Paracentesis on 05/30/2017. Nausea and pain. EXAM: LIMITED ABDOMEN ULTRASOUND FOR ASCITES TECHNIQUE: Limited ultrasound survey for ascites was performed  in all four abdominal quadrants. COMPARISON:  06/01/2017 and 05/30/2017 FINDINGS: Small amount of ascites in the right upper quadrant and right lower quadrant. No significant ascites in the left upper quadrant or left lower quadrant. Again noted are multiple hepatic cysts. IMPRESSION: Small amount of ascites in the right abdomen. Paracentesis was not performed due to the small volume. Electronically Signed   By: Richarda OverlieAdam  Henn M.D.   On: 06/02/2017 15:19     Management plans discussed with the patient, family and they are in agreement.  CODE STATUS:     Code Status Orders  (From admission, onward)        Start     Ordered   06/01/17 1306  Full code  Continuous     06/01/17 1307    Code Status History    Date Active Date Inactive Code Status Order ID Comments User Context   05/31/2016 21:01 06/07/2016 18:40 Full Code [id number]  Altamese DillingVachhani, Vaibhavkumar, MD Inpatient      TOTAL TIME TAKING CARE OF THIS PATIENT: 38 minutes.    Enid BaasKALISETTI,Roseana Rhine M.D on 06/03/2017 at 12:51 PM  Between 7am to 6pm - Pager - [phone]  After 6pm go to [email or web address] - Social research officer, governmentpassword [email or web address]  [facility] [place] [facility]  Office  [phone]  CC: Primary care physician; Corky DownsMasoud, Javed, MD   Note: This dictation was prepared with Dragon dictation along with smaller phrase technology. Any transcriptional errors that result from this process are unintentional.

## 2017-06-03 NOTE — Plan of Care (Signed)
Patient is progressing but still suffers from nausea and abdominal pain.  Melissa Bradshaw, Melissa Bradshaw

## 2017-06-03 NOTE — Progress Notes (Signed)
Melissa Bradshaw  ROUNDING NOTE   Subjective:   Patient did not get full treatment yesterday, only 10 minutes due to infiltrated AVF.   Refusing treatment today. Wants to go home. Denies any symptoms.   Objective:  Vital signs in last 24 hours:  Temp:  [98 F (36.7 C)-98.9 F (37.2 C)] 98.7 F (37.1 C) (12/01 1251) Pulse Rate:  [64-93] 64 (12/01 1251) Resp:  [15-20] 20 (12/01 0459) BP: (125-140)/(58-77) 127/62 (12/01 1251) SpO2:  [95 %-100 %] 100 % (12/01 1251) Weight:  [52.3 kg (115 lb 4.8 oz)] 52.3 kg (115 lb 4.8 oz) (12/01 0459)  Weight change: 1.542 kg (3 lb 6.4 oz) Filed Weights   06/01/17 1922 06/02/17 0500 06/03/17 0459  Weight: 55.3 kg (121 lb 14.6 oz) 53.2 kg (117 lb 3.2 oz) 52.3 kg (115 lb 4.8 oz)    Intake/Output: I/O last 3 completed shifts: In: 360 [P.O.:360] Out: -27    Intake/Output this shift:  No intake/output data recorded.  Physical Exam: General: NAD, laying in bed  Head: Normocephalic, atraumatic. Moist oral mucosal membranes  Eyes: Anicteric, PERRL  Neck: Supple, trachea midline  Lungs:  Clear to auscultation  Heart: Regular rate and rhythm  Abdomen:  soft  Extremities:  trace peripheral edema.  Neurologic: Nonfocal, moving all four extremities  Skin: No lesions  Access: Left AVG    Basic Metabolic Panel: Recent Labs  Lab 05/31/17 1442 06/01/17 1333 06/01/17 1854 06/02/17 0410 06/03/17 0521  NA 136  --   --  136 135  K 6.2*  --  3.4* 4.5 5.2*  CL 100*  --   --  99* 96*  CO2 14*  --   --  21* 19*  GLUCOSE 94  --   --  84 109*  BUN 103*  --   --  60* 66*  CREATININE 18.95* 19.61*  --  12.85* 14.38*  CALCIUM 7.3*  --   --  7.0* 7.4*    Liver Function Tests: Recent Labs  Lab 05/31/17 1442  AST 18  ALT 11*  ALKPHOS 49  BILITOT 0.9  PROT 5.7*  ALBUMIN 2.3*   Recent Labs  Lab 05/31/17 1442  LIPASE 41   No results for input(s): AMMONIA in the last 168 hours.  CBC: Recent Labs  Lab 05/31/17 1442  06/01/17 1333 06/02/17 0410  WBC 6.3 6.3 5.7  HGB 14.5 13.8 13.7  HCT 43.3 41.3 39.8  MCV 84.6 85.9 83.9  PLT 368 352 327    Cardiac Enzymes: No results for input(s): CKTOTAL, CKMB, CKMBINDEX, TROPONINI in the last 168 hours.  BNP: Invalid input(s): POCBNP  CBG: Recent Labs  Lab 06/02/17 0814 06/03/17 0739  GLUCAP 108* 90    Microbiology: Results for orders placed or performed during the hospital encounter of 06/01/17  MRSA PCR Screening     Status: None   Collection Time: 06/01/17 12:24 PM  Result Value Ref Range Status   MRSA by PCR NEGATIVE NEGATIVE Final    Comment:        The GeneXpert MRSA Assay (FDA approved for NASAL specimens only), is one component of a comprehensive MRSA colonization surveillance program. It is not intended to diagnose MRSA infection nor to guide or monitor treatment for MRSA infections.   C difficile quick scan w PCR reflex     Status: Abnormal   Collection Time: 06/01/17 12:51 PM  Result Value Ref Range Status   C Diff antigen POSITIVE (A) NEGATIVE Final   C Diff  toxin NEGATIVE NEGATIVE Final   C Diff interpretation Results are indeterminate. See PCR results.  Final  Clostridium Difficile by PCR     Status: Abnormal   Collection Time: 06/01/17 12:51 PM  Result Value Ref Range Status   Toxigenic C Difficile by pcr POSITIVE (A) NEGATIVE Final    Comment: Positive for toxigenic C. difficile with little to no toxin production. Only treat if clinical presentation suggests symptomatic illness.  Gastrointestinal Panel by PCR , Stool     Status: None   Collection Time: 06/02/17 12:51 PM  Result Value Ref Range Status   Campylobacter species NOT DETECTED NOT DETECTED Final   Plesimonas shigelloides NOT DETECTED NOT DETECTED Final   Salmonella species NOT DETECTED NOT DETECTED Final   Yersinia enterocolitica NOT DETECTED NOT DETECTED Final   Vibrio species NOT DETECTED NOT DETECTED Final   Vibrio cholerae NOT DETECTED NOT DETECTED Final    Enteroaggregative E coli (EAEC) NOT DETECTED NOT DETECTED Final   Enteropathogenic E coli (EPEC) NOT DETECTED NOT DETECTED Final   Enterotoxigenic E coli (ETEC) NOT DETECTED NOT DETECTED Final   Shiga like toxin producing E coli (STEC) NOT DETECTED NOT DETECTED Final   Shigella/Enteroinvasive E coli (EIEC) NOT DETECTED NOT DETECTED Final   Cryptosporidium NOT DETECTED NOT DETECTED Final   Cyclospora cayetanensis NOT DETECTED NOT DETECTED Final   Entamoeba histolytica NOT DETECTED NOT DETECTED Final   Giardia lamblia NOT DETECTED NOT DETECTED Final   Adenovirus F40/41 NOT DETECTED NOT DETECTED Final   Astrovirus NOT DETECTED NOT DETECTED Final   Norovirus GI/GII NOT DETECTED NOT DETECTED Final   Rotavirus A NOT DETECTED NOT DETECTED Final   Sapovirus (I, II, IV, and V) NOT DETECTED NOT DETECTED Final    Coagulation Studies: No results for input(s): LABPROT, INR in the last 72 hours.  Urinalysis: No results for input(s): COLORURINE, LABSPEC, PHURINE, GLUCOSEU, HGBUR, BILIRUBINUR, KETONESUR, PROTEINUR, UROBILINOGEN, NITRITE, LEUKOCYTESUR in the last 72 hours.  Invalid input(s): APPERANCEUR    Imaging: Ct Abdomen Pelvis Wo Contrast  Result Date: 06/01/2017 CLINICAL DATA:  Diarrhea with abdominal pain and nausea EXAM: CT ABDOMEN AND PELVIS WITHOUT CONTRAST TECHNIQUE: Multidetector CT imaging of the abdomen and pelvis was performed following the standard protocol without IV contrast. COMPARISON:  06/05/2016, 06/06/2015 FINDINGS: Lower chest: Lung bases demonstrate no acute consolidation or pleural effusion. Partially visualized cardiac pacing leads. Trace pericardial effusion. Hepatobiliary: Innumerable cysts throughout the liver parenchyma. Liver is enlarged. Some of the cysts demonstrate increased density. For example 9 cm anterior cyst, series 2, image number 41. Left lobe cyst measuring 4.9 cm. Gallbladder poorly visualized due to the numerous cysts and ascites in the abdomen. No  biliary dilatation. Pancreas: Unremarkable. No pancreatic ductal dilatation or surrounding inflammatory changes. Spleen: Normal in size without focal abnormality. Adrenals/Urinary Tract: Difficult visualization of right adrenal gland. Left adrenal gland appears within normal limits. Enlarged polycystic kidneys bilaterally with scattered calcifications and areas of increased density as before. The bladder is unremarkable. Stomach/Bowel: The stomach is nonenlarged. No dilated small bowel. No colon wall thickening. Normal appendix. Vascular/Lymphatic: Extensive atherosclerotic vascular disease. No significantly enlarged lymph nodes Reproductive: Uterus and bilateral adnexa are unremarkable. Other: Moderate to large volume of ascites in the abdomen and pelvis. No free air. Fluid in the umbilical region. Musculoskeletal: Degenerative changes. No acute or suspicious lesion IMPRESSION: 1. Negative for bowel obstruction or bowel wall thickening. Normal appendix. 2. Polycystic kidneys as before. Polycystic liver. Several of the liver cysts demonstrate increased internal density suggesting  hemorrhage or proteinaceous debris. 3. Moderate to large volume of ascites. Electronically Signed   By: Jasmine PangKim  Fujinaga M.D.   On: 06/01/2017 22:19   [place] Abdomen Limited  Result Date: 06/02/2017 CLINICAL DATA:  Recurrent ascites. Paracentesis on 05/30/2017. Nausea and pain. EXAM: LIMITED ABDOMEN ULTRASOUND FOR ASCITES TECHNIQUE: Limited ultrasound survey for ascites was performed in all four abdominal quadrants. COMPARISON:  06/01/2017 and 05/30/2017 FINDINGS: Small amount of ascites in the right upper quadrant and right lower quadrant. No significant ascites in the left upper quadrant or left lower quadrant. Again noted are multiple hepatic cysts. IMPRESSION: Small amount of ascites in the right abdomen. Paracentesis was not performed due to the small volume. Electronically Signed   By: Richarda OverlieAdam  Henn M.D.   On: 06/02/2017 15:19      Medications:    . aspirin EC  81 mg Oral Daily  . docusate sodium  100 mg Oral BID  . heparin  5,000 Units Subcutaneous Q8H   acetaminophen **OR** acetaminophen, bisacodyl, HYDROcodone-acetaminophen, ondansetron **OR** ondansetron (ZOFRAN) IV, traZODone  Assessment/ Plan:  Ms. Tristan SchroederCaroline J Khiev is a 66 y.o. black female with end stage renal disease on hemodialysis, anemia, hypertension, polycystic Bradshaw disease, history of pacemaker, history of TIA, vertebral artery aneurysm status post coiling  1. End-stage renal disease: with hyperkalemia, metabolic acidosis and uremic on admission.  Resume MWF schedule. Outpatient planning [address]. Resume old chair time.   2. Hypertension: blood pressure at goal.  - history of difficult to control. Home regimen of amlodipine, furosemide, and labetolol  3. Anemia chronic Bradshaw disease:   - holding EPO  4. Secondary hyperparathyroidism: with hyperphosphatemia and hypocalcemia. Holding cinacalcet due to hypocalcemia - calcium acetate 2 tabs with meals.     LOS: 2 Chrishelle Zito 12/1/20182:08 PM

## 2017-06-03 NOTE — Progress Notes (Signed)
Patient discharge teaching given, including activity, diet, follow-up appoints, and medications. Patient verbalized understanding of all discharge instructions. IV access was d/c'd. Vitals are stable. Skin is intact except as charted in most recent assessments. Pt to be escorted out by NT, to be driven home by family.  Serai Tukes  

## 2017-06-05 DIAGNOSIS — N186 End stage renal disease: Secondary | ICD-10-CM | POA: Diagnosis not present

## 2017-06-05 DIAGNOSIS — Z992 Dependence on renal dialysis: Secondary | ICD-10-CM | POA: Diagnosis not present

## 2017-06-06 ENCOUNTER — Other Ambulatory Visit: Payer: Self-pay | Admitting: Nephrology

## 2017-06-06 DIAGNOSIS — R188 Other ascites: Secondary | ICD-10-CM

## 2017-06-07 DIAGNOSIS — N186 End stage renal disease: Secondary | ICD-10-CM | POA: Diagnosis not present

## 2017-06-07 DIAGNOSIS — Z992 Dependence on renal dialysis: Secondary | ICD-10-CM | POA: Diagnosis not present

## 2017-06-08 ENCOUNTER — Encounter (INDEPENDENT_AMBULATORY_CARE_PROVIDER_SITE_OTHER): Payer: Self-pay | Admitting: Vascular Surgery

## 2017-06-08 ENCOUNTER — Ambulatory Visit (INDEPENDENT_AMBULATORY_CARE_PROVIDER_SITE_OTHER): Payer: Medicare Other | Admitting: Vascular Surgery

## 2017-06-08 VITALS — BP 101/60 | HR 68 | Resp 17 | Ht 66.0 in | Wt 118.0 lb

## 2017-06-08 DIAGNOSIS — Q446 Cystic disease of liver: Secondary | ICD-10-CM

## 2017-06-08 DIAGNOSIS — T829XXS Unspecified complication of cardiac and vascular prosthetic device, implant and graft, sequela: Secondary | ICD-10-CM

## 2017-06-08 DIAGNOSIS — N186 End stage renal disease: Secondary | ICD-10-CM | POA: Diagnosis not present

## 2017-06-08 DIAGNOSIS — R1013 Epigastric pain: Secondary | ICD-10-CM

## 2017-06-08 NOTE — Progress Notes (Signed)
MRN : [id number]  Melissa SchroederCaroline J Bradshaw is a 66 y.o. (11/23/1950) female who presents with chief complaint of  Chief Complaint  Patient presents with  . Follow-up    Mesenteric   .  History of Present Illness: I am asked to evaluate the patient for the complaint of abdominal pain with uncertain etiology.  The patient is followed by nephrology who is concerned about mesenteric ischemia.  She notes the pain is primarily while on dialysis but also notes that she usually has low BP while on dialysis.  The patient has noted some weight loss as well as nausea.  The patient denies bloody bowel movements or diarrhea.   No history of peptic ulcer disease.   No prior peripheral angiograms or vascular interventions.  The patient denies amaurosis fugax or recent TIA symptoms. There are no recent neurological changes noted. The patient denies claudication symptoms or rest pain symptoms. The patient denies history of DVT, PE or superficial thrombophlebitis. The patient denies recent episodes of angina     Current Meds  Medication Sig  . Amino Acid Infusion (PROSOL) 20 % SOLN   . amLODipine (NORVASC) 10 MG tablet Take by mouth.  Marland Kitchen. aspirin 81 MG tablet Take 81 mg by mouth daily.  . calcium acetate (PHOSLO) 667 MG capsule Take 2,001 mg by mouth 3 (three) times daily with meals.   . docusate sodium (COLACE) 100 MG capsule Take 1 capsule (100 mg total) by mouth 2 (two) times daily.  . furosemide (LASIX) 20 MG tablet Take by mouth.  . labetalol (NORMODYNE) 200 MG tablet Take by mouth.  . latanoprost (XALATAN) 0.005 % ophthalmic solution Place 1 drop into both eyes at bedtime.  . lidocaine-prilocaine (EMLA) cream   . ondansetron (ZOFRAN ODT) 4 MG disintegrating tablet Take 1 tablet (4 mg total) by mouth every 8 (eight) hours as needed for nausea or vomiting.    Past Medical History:  Diagnosis Date  . Anemia   . Aneurysm (HCC)    [place]  . Arthritis   . Cardiac pacemaker    . CHF (congestive heart failure) (HCC)   . Chronic kidney disease    ESRD  . Constipation   . Coronary artery disease   . Dialysis patient (HCC)    Tues, Thurs, Sat  . Gastric ulcer   . Glaucoma (increased eye pressure)   . Headache   . Hypertension   . Presence of permanent cardiac pacemaker     Past Surgical History:  Procedure Laterality Date  . ANEURYSM COILING    . AV FISTULA PLACEMENT Left 07/20/2016   Procedure: ARTERIOVENOUS (AV) FISTULA CREATION;  Surgeon: Renford DillsGregory G Charleston Vierling, MD;  Location: [facility] ORS;  Service: Vascular;  Laterality: Left;  . AV FISTULA PLACEMENT Left 08/26/2016   Procedure: INSERTION OF ARTERIOVENOUS (AV) GORE-TEX GRAFT ARM;  Surgeon: Renford DillsGregory G Wynne Rozak, MD;  Location: [facility] ORS;  Service: Vascular;  Laterality: Left;  . COLONOSCOPY     2013  . DIALYSIS/PERMA CATHETER REMOVAL N/A 10/11/2016   Procedure: Dialysis/Perma Catheter Removal;  Surgeon: Renford DillsGregory G Elvi Leventhal, MD;  Location: [facility] INVASIVE CV LAB;  Service: Cardiovascular;  Laterality: N/A;  . INSERT / REPLACE / REMOVE PACEMAKER    . IR RADIOLOGIST EVAL & MGMT  05/18/2017  . PACEMAKER PLACEMENT    . PERIPHERAL VASCULAR CATHETERIZATION N/A 06/02/2016   Procedure: Dialysis/Perma Catheter Insertion;  Surgeon: Annice NeedyJason S Dew, MD;  Location: [facility] INVASIVE CV LAB;  Service: Cardiovascular;  Laterality: N/A;  Social History Social History   Tobacco Use  . Smoking status: Former Smoker    Packs/day: 0.25    Years: 41.00    Pack years: 10.25    Types: Cigarettes    Last attempt to quit: 2007    Years since quitting: 11.9  . Smokeless tobacco: Never Used  . Tobacco comment: pt lives with people who smoke in house  Substance Use Topics  . Alcohol use: No  . Drug use: No    Family History Family History  Problem Relation Age of Onset  . Hypertension Mother   . Diabetes Father   . Cancer Sister   . Stroke Sister   . Hypertension Sister     Allergies  Allergen Reactions  . Hydralazine Hcl Other  (See Comments)    Makes pt jittery, nervous, messes with vision   . Iodine Itching and Rash    Unknown reaction  . Ivp Dye [Iodinated Diagnostic Agents] Rash     REVIEW OF SYSTEMS (Negative unless checked)  Constitutional: [] Weight loss  [] Fever  [] Chills Cardiac: [] Chest pain   [] Chest pressure   [] Palpitations   [] Shortness of breath when laying flat   [] Shortness of breath with exertion. Vascular:  [] Pain in legs with walking   [] Pain in legs at rest  [] History of DVT   [] Phlebitis   [] Swelling in legs   [] Varicose veins   [] Non-healing ulcers Pulmonary:   [] Uses home oxygen   [] Productive cough   [] Hemoptysis   [] Wheeze  [] COPD   [] Asthma Neurologic:  [] Dizziness   [] Seizures   [] History of stroke   [] History of TIA  [] Aphasia   [] Vissual changes   [] Weakness or numbness in arm   [] Weakness or numbness in leg Musculoskeletal:   [] Joint swelling   [] Joint pain   [] Low back pain Hematologic:  [] Easy bruising  [] Easy bleeding   [] Hypercoagulable state   [] Anemic Gastrointestinal:  [] Diarrhea   [] Vomiting  [x] Gastroesophageal reflux/heartburn   [] Difficulty swallowing. Genitourinary:  [x] Chronic kidney disease   [] Difficult urination  [] Frequent urination   [] Blood in urine Skin:  [] Rashes   [] Ulcers  Psychological:  [] History of anxiety   []  History of major depression.  Physical Examination  Vitals:   06/08/17 1304  BP: 101/60  Pulse: 68  Resp: 17  Weight: 118 lb (53.5 kg)  Height: 5\' 6"  (1.676 m)   Body mass index is 19.05 kg/m. Gen: WD/WN, NAD Head: [place]/AT, No temporalis wasting.  Ear/Nose/Throat: Hearing grossly intact, nares w/o erythema or drainage Eyes: PER, EOMI, sclera nonicteric.  Neck: Supple, no large masses.   Pulmonary:  Good air movement, no audible wheezing bilaterally, no use of accessory muscles.  Cardiac: RRR, no JVD Vascular:  Left arm brachial axillary AV graft good thrill good bruit Vessel Right Left  Radial Palpable Palpable  Ulnar Palpable Palpable    Brachial Palpable Palpable  Carotid Palpable Palpable  Gastrointestinal: very distended. No guarding/no peritoneal signs.  Musculoskeletal: M/S 5/5 throughout.  No deformity or atrophy.  Neurologic: CN 2-12 intact. Symmetrical.  Speech is fluent. Motor exam as listed above. Psychiatric: Judgment intact, Mood & affect appropriate for pt's clinical situation. Dermatologic: No rashes or ulcers noted.  No changes consistent with cellulitis. Lymph : No lichenification or skin changes of chronic lymphedema.  CBC Lab Results  Component Value Date   WBC 5.7 06/02/2017   HGB 13.7 06/02/2017   HCT 39.8 06/02/2017   MCV 83.9 06/02/2017   PLT 327 06/02/2017    BMET  Component Value Date/Time   NA 135 06/03/2017 0521   NA 141 01/30/2014 1505   K 5.2 (H) 06/03/2017 0521   K 5.0 01/30/2014 1505   CL 96 (L) 06/03/2017 0521   CL 116 (H) 01/30/2014 1505   CO2 19 (L) 06/03/2017 0521   CO2 17 (L) 01/30/2014 1505   GLUCOSE 109 (H) 06/03/2017 0521   GLUCOSE 93 01/30/2014 1505   BUN 66 (H) 06/03/2017 0521   BUN 50 (H) 06/19/2014 1413   CREATININE 14.38 (H) 06/03/2017 0521   CREATININE 3.33 (H) 06/19/2014 1413   CALCIUM 7.4 (L) 06/03/2017 0521   CALCIUM 8.8 01/30/2014 1505   GFRNONAA 2 (L) 06/03/2017 0521   GFRNONAA 15 (L) 06/19/2014 1413   GFRNONAA 14 (L) 01/30/2014 1505   GFRAA 3 (L) 06/03/2017 0521   GFRAA 18 (L) 06/19/2014 1413   GFRAA 16 (L) 01/30/2014 1505   Estimated Creatinine Clearance: 3.3 mL/min (A) (by C-G formula based on SCr of 14.38 mg/dL (H)).  COAG Lab Results  Component Value Date   INR 1.11 07/13/2016    Radiology Ct Abdomen Pelvis Wo Contrast  Result Date: 06/01/2017 CLINICAL DATA:  Diarrhea with abdominal pain and nausea EXAM: CT ABDOMEN AND PELVIS WITHOUT CONTRAST TECHNIQUE: Multidetector CT imaging of the abdomen and pelvis was performed following the standard protocol without IV contrast. COMPARISON:  06/05/2016, 06/06/2015 FINDINGS: Lower chest: Lung  bases demonstrate no acute consolidation or pleural effusion. Partially visualized cardiac pacing leads. Trace pericardial effusion. Hepatobiliary: Innumerable cysts throughout the liver parenchyma. Liver is enlarged. Some of the cysts demonstrate increased density. For example 9 cm anterior cyst, series 2, image number 41. Left lobe cyst measuring 4.9 cm. Gallbladder poorly visualized due to the numerous cysts and ascites in the abdomen. No biliary dilatation. Pancreas: Unremarkable. No pancreatic ductal dilatation or surrounding inflammatory changes. Spleen: Normal in size without focal abnormality. Adrenals/Urinary Tract: Difficult visualization of right adrenal gland. Left adrenal gland appears within normal limits. Enlarged polycystic kidneys bilaterally with scattered calcifications and areas of increased density as before. The bladder is unremarkable. Stomach/Bowel: The stomach is nonenlarged. No dilated small bowel. No colon wall thickening. Normal appendix. Vascular/Lymphatic: Extensive atherosclerotic vascular disease. No significantly enlarged lymph nodes Reproductive: Uterus and bilateral adnexa are unremarkable. Other: Moderate to large volume of ascites in the abdomen and pelvis. No free air. Fluid in the umbilical region. Musculoskeletal: Degenerative changes. No acute or suspicious lesion IMPRESSION: 1. Negative for bowel obstruction or bowel wall thickening. Normal appendix. 2. Polycystic kidneys as before. Polycystic liver. Several of the liver cysts demonstrate increased internal density suggesting hemorrhage or proteinaceous debris. 3. Moderate to large volume of ascites. Electronically Signed   By: Jasmine PangKim  Fujinaga M.D.   On: 06/01/2017 22:19   [place] Abdomen Limited  Result Date: 06/02/2017 CLINICAL DATA:  Recurrent ascites. Paracentesis on 05/30/2017. Nausea and pain. EXAM: LIMITED ABDOMEN ULTRASOUND FOR ASCITES TECHNIQUE: Limited ultrasound survey for ascites was performed in all four abdominal  quadrants. COMPARISON:  06/01/2017 and 05/30/2017 FINDINGS: Small amount of ascites in the right upper quadrant and right lower quadrant. No significant ascites in the left upper quadrant or left lower quadrant. Again noted are multiple hepatic cysts. IMPRESSION: Small amount of ascites in the right abdomen. Paracentesis was not performed due to the small volume. Electronically Signed   By: Richarda OverlieAdam  Henn M.D.   On: 06/02/2017 15:19   [place] Paracentesis  Result Date: 05/30/2017 INDICATION: End stage renal disease on hemodialysis. Congestive heart failure. Recurrent abdominal ascites. Request  for therapeutic paracentesis. EXAM: ULTRASOUND GUIDED LEFT LATERAL ABDOMEN PARACENTESIS MEDICATIONS: 1% Lidocaine = 15 mL COMPLICATIONS: None immediate. PROCEDURE: Informed written consent was obtained from the patient after a discussion of the risks, benefits and alternatives to treatment. A timeout was performed prior to the initiation of the procedure. Initial ultrasound scanning demonstrates a large amount of ascites within the left lateral abdomen. The left lateral abdomen was prepped and draped in the usual sterile fashion. 1% lidocaine with epinephrine was used for local anesthesia. Following this, a 19 gauge, 10-cm, Yueh catheter was introduced. An ultrasound image was saved for documentation purposes. The paracentesis was performed. The catheter was removed and a dressing was applied. The patient tolerated the procedure well without immediate post procedural complication. FINDINGS: A total of approximately 6 liters of clear yellow fluid was removed. IMPRESSION: Successful ultrasound-guided paracentesis yielding 6 liters of peritoneal fluid. Read by:  Corrin Parker, PA-C Electronically Signed   By: Gilmer Mor D.O.   On: 05/30/2017 11:31   US Paracentesis  Result Date: 05/23/2017 INDICATION: Abdominal distention secondary to recurrent ascites. Request therapeutic paracentesis up to 6 L max. EXAM: ULTRASOUND GUIDED  RIGHT LOWER QUADRANT PARACENTESIS MEDICATIONS: None. COMPLICATIONS: None immediate. PROCEDURE: Informed written consent was obtained from the patient after a discussion of the risks, benefits and alternatives to treatment. A timeout was performed prior to the initiation of the procedure. Initial ultrasound scanning demonstrates a large amount of ascites within the right lower abdominal quadrant. The right lower abdomen was prepped and draped in the usual sterile fashion. 1% lidocaine with epinephrine was used for local anesthesia. Following this, a Safe-T-Centesis catheter was introduced. An ultrasound image was saved for documentation purposes. The paracentesis was performed. The catheter was removed and a dressing was applied. The patient tolerated the procedure well without immediate post procedural complication. FINDINGS: A total of approximately 6 L of clear yellow fluid was removed. IMPRESSION: Successful ultrasound-guided paracentesis yielding 6 liters of peritoneal fluid. Read by: Brayton El PA-C Electronically Signed   By: Richarda Overlie M.D.   On: 05/23/2017 11:10   US Paracentesis  Result Date: 05/16/2017 INDICATION: Patient with a history of end-stage renal disease on hemodialysis along with congestive heart failure and recurrent abdominal ascites. Request is made for therapeutic paracentesis with a maximum of 6 L. EXAM: ULTRASOUND GUIDED THERAPEUTIC PARACENTESIS MEDICATIONS: 1% xylocaine COMPLICATIONS: None immediate. PROCEDURE: Informed written consent was obtained from the patient after a discussion of the risks, benefits and alternatives to treatment. A timeout was performed prior to the initiation of the procedure. Initial ultrasound scanning demonstrates a moderate amount of ascites within the right lower abdominal quadrant. The right lower abdomen was prepped and draped in the usual sterile fashion. 1% xylocaine was used for local anesthesia. Following this, a Safe-T-Centesis catheter was  introduced. An ultrasound image was saved for documentation purposes. The paracentesis was performed. The catheter was removed and a dressing was applied. The patient tolerated the procedure well without immediate post procedural complication. FINDINGS: A total of approximately 5 L of serous fluid was removed. IMPRESSION: Successful ultrasound-guided paracentesis yielding 5 liters of peritoneal fluid. Read by: Barnetta Chapel, PA-C Electronically Signed   By: Jolaine Click M.D.   On: 05/16/2017 11:03   Ir Radiologist Eval & Mgmt  Result Date: 05/24/2017 Please refer to notes tab for details about interventional procedure. (Op Note)    Assessment/Plan 1. Abdominal pain, epigastric Recommend:  The patient has evidence of severe atherosclerotic changes associated with abdominal pain.  Patient should undergo duplex ultrasound of the mesenteric arteries with the hope for that if present intervention could be done to eliminate the ischemic symptoms.    The patient will follow up with me after the ultrasound.   A total of 30 minutes was spent with this patient and greater than 50% was spent in counseling and coordination of care with the patient.  Discussion included the treatment options for vascular disease including indications for surgery and intervention.  Also discussed is the appropriate timing of treatment.  In addition medical therapy was discussed.  - VAS [place] MESENTERIC; Future  2. End stage renal disease (HCC) Recommend:  The patient is doing well and currently has adequate dialysis access.  The patient's dialysis center is not reporting any access issues. Flow pattern is stable when compared to the prior ultrasound.  We will not proceed with placing a PD catheter  The patient should have a duplex ultrasound of the dialysis access in 6 months.  The patient will follow-up with me in the office after each ultrasound   3. Complication of vascular access for dialysis, sequela See  #2  4. Polycystic liver disease Ascites management per GI   Levora Dredge, MD  06/08/2017 1:36 PM

## 2017-06-09 DIAGNOSIS — N186 End stage renal disease: Secondary | ICD-10-CM | POA: Diagnosis not present

## 2017-06-09 DIAGNOSIS — Z992 Dependence on renal dialysis: Secondary | ICD-10-CM | POA: Diagnosis not present

## 2017-06-13 ENCOUNTER — Ambulatory Visit: Admission: RE | Admit: 2017-06-13 | Payer: Medicare Other | Source: Ambulatory Visit

## 2017-06-13 NOTE — Progress Notes (Deleted)
MRN : [id number]  Melissa Bradshaw is a 66 y.o. ([birth year]) female who presents with chief complaint of No chief complaint on file. Marland Kitchen  History of Present Illness: ***  No outpatient medications have been marked as taking for the 06/15/17 encounter (Appointment) with Gilda Crease, Latina Craver, MD.    Past Medical History:  Diagnosis Date  . Anemia   . Aneurysm (HCC)    Cerebral, [facility] [place], [place]  . Arthritis   . Cardiac pacemaker   . CHF (congestive heart failure) (HCC)   . Chronic kidney disease    ESRD  . Constipation   . Coronary artery disease   . Dialysis patient (HCC)    Tues, Thurs, Sat  . Gastric ulcer   . Glaucoma (increased eye pressure)   . Headache   . Hypertension   . Presence of permanent cardiac pacemaker     Past Surgical History:  Procedure Laterality Date  . ANEURYSM COILING    . AV FISTULA PLACEMENT Left 07/20/2016   Procedure: ARTERIOVENOUS (AV) FISTULA CREATION;  Surgeon: Renford Dills, MD;  Location: [facility] ORS;  Service: Vascular;  Laterality: Left;  . AV FISTULA PLACEMENT Left 08/26/2016   Procedure: INSERTION OF ARTERIOVENOUS (AV) GORE-TEX GRAFT ARM;  Surgeon: Renford Dills, MD;  Location: [facility] ORS;  Service: Vascular;  Laterality: Left;  . COLONOSCOPY     2013  . DIALYSIS/PERMA CATHETER REMOVAL N/A 10/11/2016   Procedure: Dialysis/Perma Catheter Removal;  Surgeon: Renford Dills, MD;  Location: [facility];  Service: Cardiovascular;  Laterality: N/A;  . INSERT / REPLACE / REMOVE PACEMAKER    . IR RADIOLOGIST EVAL & MGMT  05/18/2017  . PACEMAKER PLACEMENT    . PERIPHERAL VASCULAR CATHETERIZATION N/A 06/02/2016   Procedure: Dialysis/Perma Catheter Insertion;  Surgeon: Annice Needy, MD;  Location: [facility] INV[facility] CV [facility];  Service: Cardiovascular;  Laterality: N/A;    Social History Social History   Tobacco Use  . Smoking status: Former Smoker    Packs/day: 0.25    Years: 41.00    Pack years: 10.25    Types: Cigarettes   Last attempt to quit: 2007    Years since quitting: 11.9  . Smokeless tobacco: Never Used  . Tobacco comment: pt lives with people who smoke in house  Substance Use Topics  . Alcohol use: No  . Drug use: No    Family History Family History  Problem Relation Age of Onset  . Hypertension Mother   . Diabetes Father   . Cancer Sister   . Stroke Sister   . Hypertension Sister     Allergies  Allergen Reactions  . Hydralazine Hcl Other (See Comments)    Makes pt jittery, nervous, messes with vision   . Iodine Itching and Rash    Unknown reaction  . Ivp Dye [Iodinated Diagnostic Agents] Rash     REVIEW OF SYSTEMS (Negative unless checked)  Constitutional: [] Weight loss  [] Fever  [] Chills Cardiac: [] Chest pain   [] Chest pressure   [] Palpitations   [] Shortness of breath when laying flat   [] Shortness of breath with exertion. Vascular:  [] Pain in legs with walking   [] Pain in legs at rest  [] History of DVT   [] Phlebitis   [] Swelling in legs   [] Varicose veins   [] Non-healing ulcers Pulmonary:   [] Uses home oxygen   [] Productive cough   [] Hemoptysis   [] Wheeze  [] COPD   [] Asthma Neurologic:  [] Dizziness   [] Seizures   [] History of  stroke   [] History of TIA  [] Aphasia   [] Vissual changes   [] Weakness or numbness in arm   [] Weakness or numbness in leg Musculoskeletal:   [] Joint swelling   [] Joint pain   [] Low back pain Hematologic:  [] Easy bruising  [] Easy bleeding   [] Hypercoagulable state   [] Anemic Gastrointestinal:  [] Diarrhea   [] Vomiting  [] Gastroesophageal reflux/heartburn   [] Difficulty swallowing. Genitourinary:  [] Chronic kidney disease   [] Difficult urination  [] Frequent urination   [] Blood in urine Skin:  [] Rashes   [] Ulcers  Psychological:  [] History of anxiety   []  History of major depression.  Physical Examination  There were no vitals filed for this visit. There is no height or weight on file to calculate BMI. Gen: WD/WN, NAD Head: [place]/AT, No temporalis wasting.    Ear/Nose/Throat: Hearing grossly intact, nares w/o erythema or drainage Eyes: PER, EOMI, sclera nonicteric.  Neck: Supple, no large masses.   Pulmonary:  Good air movement, no audible wheezing bilaterally, no use of accessory muscles.  Cardiac: RRR, no JVD Vascular: *** Vessel Right Left  Radial Palpable Palpable  Ulnar Palpable Palpable  Brachial Palpable Palpable  Carotid Palpable Palpable  Femoral Palpable Palpable  Popliteal Palpable Palpable  PT Palpable Palpable  DP Palpable Palpable  Gastrointestinal: Non-distended. No guarding/no peritoneal signs.  Musculoskeletal: M/S 5/5 throughout.  No deformity or atrophy.  Neurologic: CN 2-12 intact. Symmetrical.  Speech is fluent. Motor exam as listed above. Psychiatric: Judgment intact, Mood & affect appropriate for pt's clinical situation. Dermatologic: No rashes or ulcers noted.  No changes consistent with cellulitis. Lymph : No lichenification or skin changes of chronic lymphedema.  CBC Lab Results  Component Value Date   WBC 5.7 06/02/2017   HGB 13.7 06/02/2017   HCT 39.8 06/02/2017   MCV 83.9 06/02/2017   PLT 327 06/02/2017    BMET    Component Value Date/Time   NA 135 06/03/2017 0521   NA 141 01/30/2014 1505   K 5.2 (H) 06/03/2017 0521   K 5.0 01/30/2014 1505   CL 96 (L) 06/03/2017 0521   CL 116 (H) 01/30/2014 1505   CO2 19 (L) 06/03/2017 0521   CO2 17 (L) 01/30/2014 1505   GLUCOSE 109 (H) 06/03/2017 0521   GLUCOSE 93 01/30/2014 1505   BUN 66 (H) 06/03/2017 0521   BUN 50 (H) 06/19/2014 1413   CREATININE 14.38 (H) 06/03/2017 0521   CREATININE 3.33 (H) 06/19/2014 1413   CALCIUM 7.4 (L) 06/03/2017 0521   CALCIUM 8.8 01/30/2014 1505   GFRNONAA 2 (L) 06/03/2017 0521   GFRNONAA 15 (L) 06/19/2014 1413   GFRNONAA 14 (L) 01/30/2014 1505   GFRAA 3 (L) 06/03/2017 0521   GFRAA 18 (L) 06/19/2014 1413   GFRAA 16 (L) 01/30/2014 1505   Estimated Creatinine Clearance: 3.3 mL/min (A) (by C-G formula based on SCr of 14.38  mg/dL (H)).  COAG Lab Results  Component Value Date   INR 1.11 07/13/2016    Radiology Ct Abdomen Pelvis Wo Contrast  Result Date: 06/01/2017 CLINICAL DATA:  Diarrhea with abdominal pain and nausea EXAM: CT ABDOMEN AND PELVIS WITHOUT CONTRAST TECHNIQUE: Multidetector CT imaging of the abdomen and pelvis was performed following the standard protocol without IV contrast. COMPARISON:  06/05/2016, 06/06/2015 FINDINGS: Lower chest: Lung bases demonstrate no acute consolidation or pleural effusion. Partially visualized cardiac pacing leads. Trace pericardial effusion. Hepatobiliary: Innumerable cysts throughout the liver parenchyma. Liver is enlarged. Some of the cysts demonstrate increased density. For example 9 cm anterior cyst, series 2,  image number 41. Left lobe cyst measuring 4.9 cm. Gallbladder poorly visualized due to the numerous cysts and ascites in the abdomen. No biliary dilatation. Pancreas: Unremarkable. No pancreatic ductal dilatation or surrounding inflammatory changes. Spleen: Normal in size without focal abnormality. Adrenals/Urinary Tract: Difficult visualization of right adrenal gland. Left adrenal gland appears within normal limits. Enlarged polycystic kidneys bilaterally with scattered calcifications and areas of increased density as before. The bladder is unremarkable. Stomach/Bowel: The stomach is nonenlarged. No dilated small bowel. No colon wall thickening. Normal appendix. Vascular/Lymphatic: Extensive atherosclerotic vascular disease. No significantly enlarged lymph nodes Reproductive: Uterus and bilateral adnexa are unremarkable. Other: Moderate to large volume of ascites in the abdomen and pelvis. No free air. Fluid in the umbilical region. Musculoskeletal: Degenerative changes. No acute or suspicious lesion IMPRESSION: 1. Negative for bowel obstruction or bowel wall thickening. Normal appendix. 2. Polycystic kidneys as before. Polycystic liver. Several of the liver cysts  demonstrate increased internal density suggesting hemorrhage or proteinaceous debris. 3. Moderate to large volume of ascites. Electronically Signed   By: Jasmine PangKim  Fujinaga M.D.   On: 06/01/2017 22:19   [place] Abdomen Limited  Result Date: 06/02/2017 CLINICAL DATA:  Recurrent ascites. Paracentesis on 05/30/2017. Nausea and pain. EXAM: LIMITED ABDOMEN ULTRASOUND FOR ASCITES TECHNIQUE: Limited ultrasound survey for ascites was performed in all four abdominal quadrants. COMPARISON:  06/01/2017 and 05/30/2017 FINDINGS: Small amount of ascites in the right upper quadrant and right lower quadrant. No significant ascites in the left upper quadrant or left lower quadrant. Again noted are multiple hepatic cysts. IMPRESSION: Small amount of ascites in the right abdomen. Paracentesis was not performed due to the small volume. Electronically Signed   By: Richarda OverlieAdam  Henn M.D.   On: 06/02/2017 15:19   [place] Paracentesis  Result Date: 05/30/2017 INDICATION: End stage renal disease on hemodialysis. Congestive heart failure. Recurrent abdominal ascites. Request for therapeutic paracentesis. EXAM: ULTRASOUND GUIDED LEFT LATERAL ABDOMEN PARACENTESIS MEDICATIONS: 1% Lidocaine = 15 mL COMPLICATIONS: None immediate. PROCEDURE: Informed written consent was obtained from the patient after a discussion of the risks, benefits and alternatives to treatment. A timeout was performed prior to the initiation of the procedure. Initial ultrasound scanning demonstrates a large amount of ascites within the left lateral abdomen. The left lateral abdomen was prepped and draped in the usual sterile fashion. 1% lidocaine with epinephrine was used for local anesthesia. Following this, a 19 gauge, 10-cm, Yueh catheter was introduced. An ultrasound image was saved for documentation purposes. The paracentesis was performed. The catheter was removed and a dressing was applied. The patient tolerated the procedure well without immediate post procedural complication.  FINDINGS: A total of approximately 6 liters of clear yellow fluid was removed. IMPRESSION: Successful ultrasound-guided paracentesis yielding 6 liters of peritoneal fluid. Read by:  Corrin ParkerWendy Blair, PA-C Electronically Signed   By: Gilmer MorJaime  Wagner D.O.   On: 05/30/2017 11:31   [place] Paracentesis  Result Date: 05/23/2017 INDICATION: Abdominal distention secondary to recurrent ascites. Request therapeutic paracentesis up to 6 L max. EXAM: ULTRASOUND GUIDED RIGHT LOWER QUADRANT PARACENTESIS MEDICATIONS: None. COMPLICATIONS: None immediate. PROCEDURE: Informed written consent was obtained from the patient after a discussion of the risks, benefits and alternatives to treatment. A timeout was performed prior to the initiation of the procedure. Initial ultrasound scanning demonstrates a large amount of ascites within the right lower abdominal quadrant. The right lower abdomen was prepped and draped in the usual sterile fashion. 1% lidocaine with epinephrine was used for local anesthesia. Following this, a Safe-T-Centesis catheter  was introduced. An ultrasound image was saved for documentation purposes. The paracentesis was performed. The catheter was removed and a dressing was applied. The patient tolerated the procedure well without immediate post procedural complication. FINDINGS: A total of approximately 6 L of clear yellow fluid was removed. IMPRESSION: Successful ultrasound-guided paracentesis yielding 6 liters of peritoneal fluid. Read by: Brayton ElKevin Bruning PA-C Electronically Signed   By: Richarda OverlieAdam  Henn M.D.   On: 05/23/2017 11:10   [place] Paracentesis  Result Date: 05/16/2017 INDICATION: Patient with a history of end-stage renal disease on hemodialysis along with congestive heart failure and recurrent abdominal ascites. Request is made for therapeutic paracentesis with a maximum of 6 L. EXAM: ULTRASOUND GUIDED THERAPEUTIC PARACENTESIS MEDICATIONS: 1% xylocaine COMPLICATIONS: None immediate. PROCEDURE: Informed written consent  was obtained from the patient after a discussion of the risks, benefits and alternatives to treatment. A timeout was performed prior to the initiation of the procedure. Initial ultrasound scanning demonstrates a moderate amount of ascites within the right lower abdominal quadrant. The right lower abdomen was prepped and draped in the usual sterile fashion. 1% xylocaine was used for local anesthesia. Following this, a Safe-T-Centesis catheter was introduced. An ultrasound image was saved for documentation purposes. The paracentesis was performed. The catheter was removed and a dressing was applied. The patient tolerated the procedure well without immediate post procedural complication. FINDINGS: A total of approximately 5 L of serous fluid was removed. IMPRESSION: Successful ultrasound-guided paracentesis yielding 5 liters of peritoneal fluid. Read by: Barnetta ChapelKelly Osborne, PA-C Electronically Signed   By: Jolaine ClickArthur  Hoss M.D.   On: 05/16/2017 11:03   Ir Radiologist Eval & Mgmt  Result Date: 05/24/2017 Please refer to notes tab for details about interventional procedure. (Op Note)   Outside Studies/Documentation *** pages of outside documents were reviewed.  They showed ***.  Assessment/Plan 1. Abdominal pain, epigastric ***  2. End stage renal disease (HCC) ***  3. Essential (primary) hypertension ***    Levora DredgeGregory Schnier, MD  06/13/2017 9:16 PM

## 2017-06-14 ENCOUNTER — Other Ambulatory Visit: Payer: Self-pay

## 2017-06-14 ENCOUNTER — Emergency Department
Admission: EM | Admit: 2017-06-14 | Discharge: 2017-06-14 | Disposition: A | Discharge status: Home / Self Care | Payer: Medicare Other | Attending: Emergency Medicine | Admitting: Emergency Medicine

## 2017-06-14 ENCOUNTER — Ambulatory Visit
Admission: RE | Admit: 2017-06-14 | Discharge: 2017-06-14 | Disposition: A | Discharge status: Home / Self Care | Payer: Medicare Other | Source: Ambulatory Visit | Attending: Nephrology | Admitting: Nephrology

## 2017-06-14 DIAGNOSIS — N186 End stage renal disease: Secondary | ICD-10-CM | POA: Insufficient documentation

## 2017-06-14 DIAGNOSIS — Z950 Presence of cardiac pacemaker: Secondary | ICD-10-CM | POA: Diagnosis not present

## 2017-06-14 DIAGNOSIS — R112 Nausea with vomiting, unspecified: Secondary | ICD-10-CM | POA: Diagnosis not present

## 2017-06-14 DIAGNOSIS — Z87891 Personal history of nicotine dependence: Secondary | ICD-10-CM | POA: Insufficient documentation

## 2017-06-14 DIAGNOSIS — R188 Other ascites: Secondary | ICD-10-CM | POA: Insufficient documentation

## 2017-06-14 DIAGNOSIS — R197 Diarrhea, unspecified: Secondary | ICD-10-CM | POA: Diagnosis not present

## 2017-06-14 DIAGNOSIS — I509 Heart failure, unspecified: Secondary | ICD-10-CM | POA: Diagnosis not present

## 2017-06-14 DIAGNOSIS — Z7982 Long term (current) use of aspirin: Secondary | ICD-10-CM | POA: Diagnosis not present

## 2017-06-14 DIAGNOSIS — Z79899 Other long term (current) drug therapy: Secondary | ICD-10-CM | POA: Insufficient documentation

## 2017-06-14 DIAGNOSIS — R1030 Lower abdominal pain, unspecified: Secondary | ICD-10-CM | POA: Diagnosis not present

## 2017-06-14 DIAGNOSIS — I132 Hypertensive heart and chronic kidney disease with heart failure and with stage 5 chronic kidney disease, or end stage renal disease: Secondary | ICD-10-CM | POA: Insufficient documentation

## 2017-06-14 DIAGNOSIS — I2510 Atherosclerotic heart disease of native coronary artery without angina pectoris: Secondary | ICD-10-CM | POA: Insufficient documentation

## 2017-06-14 DIAGNOSIS — Z992 Dependence on renal dialysis: Secondary | ICD-10-CM | POA: Diagnosis not present

## 2017-06-14 LAB — COMPREHENSIVE METABOLIC PANEL
ALT: 13 U/L — AB (ref 14–54)
AST: 21 U/L (ref 15–41)
Albumin: 2.3 g/dL — ABNORMAL LOW (ref 3.5–5.0)
Alkaline Phosphatase: 53 U/L (ref 38–126)
Anion gap: 15 (ref 5–15)
BILIRUBIN TOTAL: 0.8 mg/dL (ref 0.3–1.2)
BUN: 47 mg/dL — AB (ref 6–20)
CHLORIDE: 96 mmol/L — AB (ref 101–111)
CO2: 24 mmol/L (ref 22–32)
CREATININE: 11.61 mg/dL — AB (ref 0.44–1.00)
Calcium: 7.2 mg/dL — ABNORMAL LOW (ref 8.9–10.3)
GFR, EST AFRICAN AMERICAN: 3 mL/min — AB (ref >60)
GFR, EST NON AFRICAN AMERICAN: 3 mL/min — AB (ref >60)
Glucose, Bld: 82 mg/dL (ref 65–99)
POTASSIUM: 4.5 mmol/L (ref 3.5–5.1)
Sodium: 135 mmol/L (ref 135–145)
TOTAL PROTEIN: 5.9 g/dL — AB (ref 6.5–8.1)

## 2017-06-14 LAB — CBC
HCT: 40.1 % (ref 35.0–47.0)
Hemoglobin: 13.4 g/dL (ref 12.0–16.0)
MCH: 29 pg (ref 26.0–34.0)
MCHC: 33.3 g/dL (ref 32.0–36.0)
MCV: 86.9 fL (ref 80.0–100.0)
PLATELETS: 248 10*3/uL (ref 150–440)
RBC: 4.62 MIL/uL (ref 3.80–5.20)
RDW: 15.8 % — AB (ref 11.5–14.5)
WBC: 4.6 10*3/uL (ref 3.6–11.0)

## 2017-06-14 LAB — APTT: APTT: 29 s (ref 24–36)

## 2017-06-14 LAB — LIPASE, BLOOD: LIPASE: 52 U/L — AB (ref 11–51)

## 2017-06-14 LAB — PROTIME-INR
INR: 0.95
PROTHROMBIN TIME: 12.6 s (ref 11.4–15.2)

## 2017-06-14 NOTE — ED Triage Notes (Addendum)
Pt started with NVD at 1AM today. Bloating to abdomen. scheduled for paracentesis on Thursday but does not feel like she can await that long. Last time she had dialysis was Friday. Pt alert and oriented X4, active, cooperative, pt in NAD. RR even and unlabored, color WNL.

## 2017-06-14 NOTE — ED Notes (Signed)
Pt verbalizes d/c teaching and follow up. Pt in NAD at time of d/c, VS stable, pt in wc to lobby to wait on ride

## 2017-06-14 NOTE — ED Provider Notes (Signed)
[facility] Emergency Department Provider Note  ____________________________________________   First MD Initiated Contact with Patient 06/14/17 [phone]     (approximate)  I have reviewed the triage vital signs and the nursing notes.   HISTORY  Chief Complaint Bloated; Emesis; and Diarrhea   HPI Melissa Bradshaw is a 66 y.o. female with a history of end-stage renal disease as well as liver cysts with recurrent ascites who is presenting to the emergency department abdominal distention.  She is also stating that she vomited 3 times and had 3 episodes of diarrhea throughout the night.  She says this is common for her when she needs a paracentesis.  She says that she missed her paracentesis yesterday due to snow and being unable to leave her home safely.    Past Medical History:  Diagnosis Date  . Anemia   . Aneurysm (HCC)    [place]  . Arthritis   . Cardiac pacemaker   . CHF (congestive heart failure) (HCC)   . Chronic kidney disease    ESRD  . Constipation   . Coronary artery disease   . Dialysis patient (HCC)    Tues, Thurs, Sat  . Gastric ulcer   . Glaucoma (increased eye pressure)   . Headache   . Hypertension   . Presence of permanent cardiac pacemaker     Patient Active Problem List   Diagnosis Date Noted  . Abdominal pain, epigastric 06/08/2017  . Hyperkalemia 06/01/2017  . Ascites 03/29/2017  . Polycystic liver disease 09/30/2016  . Pre-transplant evaluation for kidney transplant 09/30/2016  . Complication of vascular access for dialysis 08/15/2016  . Anemia 08/08/2016  . Protein-calorie malnutrition, severe 06/01/2016  . Fluid overload 05/31/2016  . Anemia, chronic renal failure 01/31/2016  . End stage renal disease (HCC) 01/01/2015  . Iron deficiency anemia 11/01/2014  . Essential (primary) hypertension 11/08/2012  . Artificial cardiac pacemaker 11/08/2012  . Congenital cystic disease of kidney 11/08/2012    . Temporary cerebral vascular dysfunction 11/08/2012  . Tobacco abuse, in remission 11/08/2012  . 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 Abnormal presence of protein in urine 09/21/2011  . ADPKD (autosomal dominant polycystic kidney disease) 06/07/2011    Past Surgical History:  Procedure Laterality Date  . ANEURYSM COILING    . AV FISTULA PLACEMENT Left 07/20/2016   Procedure: ARTERIOVENOUS (AV) FISTULA CREATION;  Surgeon: Renford DillsGregory G Schnier, MD;  Location: [facility] ORS;  Service: Vascular;  Laterality: Left;  . AV FISTULA PLACEMENT Left 08/26/2016   Procedure: INSERTION OF ARTERIOVENOUS (AV) GORE-TEX GRAFT ARM;  Surgeon: Renford DillsGregory G Schnier, MD;  Location: [facility] ORS;  Service: Vascular;  Laterality: Left;  . COLONOSCOPY     2013  . DIALYSIS/PERMA CATHETER REMOVAL N/A 10/11/2016   Procedure: Dialysis/Perma Catheter Removal;  Surgeon: Renford DillsGregory G Schnier, MD;  Location: [facility] INV[facility]IVE [facility] LAB;  Service: Cardiovascular;  Laterality: N/A;  . INSERT / REPLACE / REMOVE PACEMAKER    . IR RADIOLOGIST EVAL & MGMT  05/18/2017  . PACEMAKER PLACEMENT    . PERIPHERAL VASCULAR CATHETERIZATION N/A 06/02/2016   Procedure: Dialysis/Perma Catheter Insertion;  Surgeon: Annice NeedyJason S Dew, MD;  Location: [facility] INV[facility]IVE CV LAB;  Service: Cardiovascular;  Laterality: N/A;    Prior to Admission medications   Medication Sig Start Date End Date Taking? Authorizing Provider  aspirin 81 MG tablet Take 81 mg by mouth daily.   Yes [provider]  calcium acetate (PHOSLO) 667 MG capsule Take 2,001  mg by mouth 3 (three) times daily with meals.    Yes [provider]  docusate sodium (COLACE) 100 MG capsule Take 1 capsule (100 mg total) by mouth 2 (two) times daily. Patient taking differently: Take 100 mg by mouth every other day.  06/03/17  Yes Enid Baas, MD  furosemide (LASIX) 20 MG tablet Take 20 mg by mouth daily.  05/17/16  Yes [provider]  latanoprost (XALATAN) 0.005 % ophthalmic  solution Place 1 drop into both eyes at bedtime.   Yes [provider]  lidocaine-prilocaine (EMLA) cream  11/01/16   [provider]  ondansetron (ZOFRAN ODT) 4 MG disintegrating tablet Take 1 tablet (4 mg total) by mouth every 8 (eight) hours as needed for nausea or vomiting. 06/03/17   Enid Baas, MD    Allergies Hydralazine hcl; Iodine; and Ivp dye [iodinated diagnostic agents]  Family History  Problem Relation Age of Onset  . Hypertension Mother   . Diabetes Father   . Cancer Sister   . Stroke Sister   . Hypertension Sister     Social History Social History   Tobacco Use  . Smoking status: Former Smoker    Packs/day: 0.25    Years: 41.00    Pack years: 10.25    Types: Cigarettes    Last attempt to quit: 2007    Years since quitting: 11.9  . Smokeless tobacco: Never Used  . Tobacco comment: pt lives with people who smoke in house  Substance Use Topics  . Alcohol use: No  . Drug use: No    Review of Systems  Constitutional: No fever/chills Eyes: No visual changes. ENT: No sore throat. Cardiovascular: Denies chest pain. Respiratory: Denies shortness of breath. Gastrointestinal:  No constipation. Genitourinary: Negative for dysuria. Musculoskeletal: Negative for back pain. Skin: Negative for rash. Neurological: Negative for headaches, focal weakness or numbness.   ____________________________________________   PHYSICAL EXAM:  VITAL SIGNS: ED Triage Vitals  Enc Vitals Group     BP 06/14/17 0846 130/84     Pulse Rate 06/14/17 0846 63     Resp 06/14/17 0846 16     Temp 06/14/17 0846 98.5 F (36.9 C)     Temp Source 06/14/17 0846 Oral     SpO2 06/14/17 0846 98 %     Weight 06/14/17 0846 120 lb (54.4 kg)     Height 06/14/17 0846 5\' 6"  (1.676 m)     Head Circumference --      Peak Flow --      Pain Score 06/14/17 0845 10     Pain Loc --      Pain Edu? --      Excl. in GC? --     Constitutional: Alert and oriented. Well  appearing and in no acute distress. Eyes: Conjunctivae are normal.  Head: Atraumatic. Nose: No congestion/rhinnorhea. Mouth/Throat: Mucous membranes are moist.  Neck: No stridor.   Cardiovascular: Normal rate, regular rhythm. Grossly normal heart sounds.  Good peripheral circulation with palpable thrill to the left upper extremity dialysis access. Respiratory: Normal respiratory effort.  No retractions. Lungs CTAB. Gastrointestinal: Soft with severe ascites with fluid wave.  Umbilical hernia which is soft and easily reducible.  Minimal tenderness throughout the abdomen.   Musculoskeletal: No lower extremity tenderness nor edema.  No joint effusions. Neurologic:  Normal speech and language. No gross focal neurologic deficits are appreciated. Skin:  Skin is warm, dry and intact. No rash noted. Psychiatric: Mood and affect are normal. Speech and  behavior are normal.  ____________________________________________   LABS (all labs ordered are listed, but only abnormal results are displayed)  Labs Reviewed  LIPASE, BLOOD - Abnormal; Notable for the following components:      Result Value   Lipase 52 (*)    All other components within normal limits  COMPREHENSIVE METABOLIC PANEL - Abnormal; Notable for the following components:   Chloride 96 (*)    BUN 47 (*)    Creatinine, Ser 11.61 (*)    Calcium 7.2 (*)    Total Protein 5.9 (*)    Albumin 2.3 (*)    ALT 13 (*)    GFR calc non Af Amer 3 (*)    GFR calc Af Amer 3 (*)    All other components within normal limits  CBC - Abnormal; Notable for the following components:   RDW 15.8 (*)    All other components within normal limits  PROTIME-INR  APTT   ____________________________________________  EKG  ED ECG REPORT I, Arelia Longestavid M Mujtaba Bollig, the attending physician, personally viewed and interpreted this ECG.   Date: 06/14/2017  EKG Time: 0857  Rate: 68  Rhythm: normal sinus rhythm  Axis: Normal  Intervals:none  ST&T Change: No ST  segment elevation or depression.  No abnormal T wave inversion.  ____________________________________________  RADIOLOGY   ____________________________________________   PROCEDURES  Procedure(s) performed:   Procedures  Critical Care performed:   ____________________________________________   INITIAL IMPRESSION / ASSESSMENT AND PLAN / ED COURSE  Pertinent labs & imaging results that were available during my care of the patient were reviewed by me and considered in my medical decision making (see chart for details).  Differential diagnosis includes, but is not limited to, ovarian cyst, ovarian torsion, acute appendicitis, diverticulitis, urinary tract infection/pyelonephritis, endometriosis, bowel obstruction, colitis, renal colic, gastroenteritis, hernia, fibroids, endometriosis, pregnancy related pain including ectopic pregnancy, etc. Differential diagnosis includes, but is not limited to, biliary disease (biliary colic, acute cholecystitis, cholangitis, choledocholithiasis, etc), intrathoracic causes for epigastric abdominal pain including ACS, gastritis, duodenitis, pancreatitis, small bowel or large bowel obstruction, abdominal aortic aneurysm, hernia, and gastritis.  As part of my medical decision making, I reviewed the following data within the electronic MEDICAL RECORD NUMBER Notes from prior ED visits  Discussed the case with ultrasound who says that the patient has a scheduled paracentesis at 2:30 PM today.  The patient is agreeable to go to this appointment.  We will check her labs she says that she has not been to dialysis in 3 weeks.  She says that her kidney doctors have been holding her dialysis because of "nothing to drain."      ----------------------------------------- 12:57 PM on 06/14/2017 -----------------------------------------  Patient without any vomiting in the emergency department.  Actually requesting food at this time.  Patient's labs appear to be at her  baseline.  Normal potassium.  Patient will be discharged.  She says that she will report directly to the medical Mall to have her 230 procedure done.  ____________________________________________   FINAL CLINICAL IMPRESSION(S) / ED DIAGNOSES  Final diagnoses:  Ascites  Nausea vomiting and diarrhea.    NEW MEDICATIONS STARTED DURING THIS VISIT:  This SmartLink is deprecated. Use AVSMEDLIST instead to display the medication list for a patient.   Note:  This document was prepared using Dragon voice recognition software and may include unintentional dictation errors.     Myrna BlazerSchaevitz, Ottie Neglia Matthew, MD 06/14/17 1257

## 2017-06-14 NOTE — ED Notes (Addendum)
Pt is heard yelling "nurse" from station upon entering room pt states " I dont have a call bell and I've been trying to get somebody", RN informed pt call bell was on side rail, pt states " all ya'll do is collect money" Pt staes she wants to be moved to a different room so she can see people. Pt states " just go back up to the station, I'm not talking to you" .

## 2017-06-14 NOTE — Progress Notes (Signed)
Chaplain visited with pt. Pt was calm, respectful, and inquisitive. Pt talked about her personal life, her family, her losses in life, her faith in the transcendent being, and her health challenges. Pt regrets that she never got to know her father, and that her 3 children never had their father in their life. Patient, however, was grateful that she worked hard and raised her children well. Pt was reflective and requested prayer support, which [facility] provided with a ministry of presence. Chaplain will follow up pt as needed.     06/14/17 1200  Clinical Encounter Type  Visited With Patient;Health care provider  Visit Type Initial;Follow-up;Spiritual support;ED;Other (Comment)  Referral From Nurse  Consult/Referral To Chaplain  Spiritual Encounters  Spiritual Needs Prayer;Emotional;Other (Comment)

## 2017-06-14 NOTE — ED Notes (Signed)
Labs were attempted by RN and Tech and was not able to obtain blood. Pt said we could not stick her again. If they need to do an IV they can get blood at that time.

## 2017-06-14 NOTE — ED Notes (Signed)
MD at bedside. 

## 2017-06-14 NOTE — ED Notes (Signed)
Lab called for blood work at this time, still waiting on IV team

## 2017-06-14 NOTE — ED Notes (Signed)
Lab at bedside

## 2017-06-15 ENCOUNTER — Encounter (INDEPENDENT_AMBULATORY_CARE_PROVIDER_SITE_OTHER): Payer: Medicare Other

## 2017-06-15 ENCOUNTER — Ambulatory Visit (INDEPENDENT_AMBULATORY_CARE_PROVIDER_SITE_OTHER): Payer: Medicare Other | Admitting: Vascular Surgery

## 2017-06-16 ENCOUNTER — Other Ambulatory Visit (INDEPENDENT_AMBULATORY_CARE_PROVIDER_SITE_OTHER): Payer: Self-pay | Admitting: Vascular Surgery

## 2017-06-16 ENCOUNTER — Encounter (INDEPENDENT_AMBULATORY_CARE_PROVIDER_SITE_OTHER): Payer: Self-pay

## 2017-06-18 MED ORDER — CEFAZOLIN SODIUM-DEXTROSE 1-4 GM/50ML-% IV SOLN: 1.0000 g | Freq: Once | INTRAVENOUS | Status: DC

## 2017-06-19 ENCOUNTER — Ambulatory Visit
Admission: RE | Admit: 2017-06-19 | Discharge: 2017-06-19 | Disposition: A | Discharge status: Home / Self Care | Payer: Medicare Other | Source: Ambulatory Visit | Attending: Vascular Surgery | Admitting: Vascular Surgery

## 2017-06-19 ENCOUNTER — Ambulatory Visit
Admission: RE | Admit: 2017-06-19 | Discharge: 2017-06-19 | Disposition: A | Discharge status: Home / Self Care | Payer: Medicare Other | Source: Ambulatory Visit | Attending: Nephrology | Admitting: Nephrology

## 2017-06-19 ENCOUNTER — Encounter: Payer: Self-pay | Admitting: *Deleted

## 2017-06-19 ENCOUNTER — Encounter
Admission: RE | Disposition: A | Discharge status: Home / Self Care | Payer: Self-pay | Source: Ambulatory Visit | Attending: Vascular Surgery

## 2017-06-19 DIAGNOSIS — I120 Hypertensive chronic kidney disease with stage 5 chronic kidney disease or end stage renal disease: Secondary | ICD-10-CM | POA: Diagnosis not present

## 2017-06-19 DIAGNOSIS — D631 Anemia in chronic kidney disease: Secondary | ICD-10-CM | POA: Diagnosis not present

## 2017-06-19 DIAGNOSIS — T82898A Other specified complication of vascular prosthetic devices, implants and grafts, initial encounter: Secondary | ICD-10-CM | POA: Diagnosis not present

## 2017-06-19 DIAGNOSIS — I2510 Atherosclerotic heart disease of native coronary artery without angina pectoris: Secondary | ICD-10-CM | POA: Insufficient documentation

## 2017-06-19 DIAGNOSIS — Z87891 Personal history of nicotine dependence: Secondary | ICD-10-CM | POA: Insufficient documentation

## 2017-06-19 DIAGNOSIS — Z9119 Patient's noncompliance with other medical treatment and regimen: Secondary | ICD-10-CM | POA: Diagnosis not present

## 2017-06-19 DIAGNOSIS — Z91041 Radiographic dye allergy status: Secondary | ICD-10-CM

## 2017-06-19 DIAGNOSIS — T829XXA Unspecified complication of cardiac and vascular prosthetic device, implant and graft, initial encounter: Secondary | ICD-10-CM | POA: Diagnosis not present

## 2017-06-19 DIAGNOSIS — M1990 Unspecified osteoarthritis, unspecified site: Secondary | ICD-10-CM | POA: Diagnosis not present

## 2017-06-19 DIAGNOSIS — I132 Hypertensive heart and chronic kidney disease with heart failure and with stage 5 chronic kidney disease, or end stage renal disease: Secondary | ICD-10-CM

## 2017-06-19 DIAGNOSIS — R188 Other ascites: Secondary | ICD-10-CM

## 2017-06-19 DIAGNOSIS — Y832 Surgical operation with anastomosis, bypass or graft as the cause of abnormal reaction of the patient, or of later complication, without mention of misadventure at the time of the procedure: Secondary | ICD-10-CM | POA: Insufficient documentation

## 2017-06-19 DIAGNOSIS — Z7982 Long term (current) use of aspirin: Secondary | ICD-10-CM | POA: Diagnosis not present

## 2017-06-19 DIAGNOSIS — N2581 Secondary hyperparathyroidism of renal origin: Secondary | ICD-10-CM | POA: Diagnosis not present

## 2017-06-19 DIAGNOSIS — Z950 Presence of cardiac pacemaker: Secondary | ICD-10-CM

## 2017-06-19 DIAGNOSIS — E872 Acidosis: Secondary | ICD-10-CM | POA: Diagnosis not present

## 2017-06-19 DIAGNOSIS — K429 Umbilical hernia without obstruction or gangrene: Secondary | ICD-10-CM | POA: Diagnosis not present

## 2017-06-19 DIAGNOSIS — H409 Unspecified glaucoma: Secondary | ICD-10-CM | POA: Diagnosis not present

## 2017-06-19 DIAGNOSIS — Z539 Procedure and treatment not carried out, unspecified reason: Secondary | ICD-10-CM

## 2017-06-19 DIAGNOSIS — I770 Arteriovenous fistula, acquired: Secondary | ICD-10-CM | POA: Diagnosis not present

## 2017-06-19 DIAGNOSIS — N186 End stage renal disease: Secondary | ICD-10-CM | POA: Diagnosis not present

## 2017-06-19 DIAGNOSIS — Z8711 Personal history of peptic ulcer disease: Secondary | ICD-10-CM | POA: Diagnosis not present

## 2017-06-19 DIAGNOSIS — Z79899 Other long term (current) drug therapy: Secondary | ICD-10-CM | POA: Diagnosis not present

## 2017-06-19 DIAGNOSIS — Z9115 Patient's noncompliance with renal dialysis: Secondary | ICD-10-CM | POA: Diagnosis not present

## 2017-06-19 DIAGNOSIS — T82868A Thrombosis of vascular prosthetic devices, implants and grafts, initial encounter: Secondary | ICD-10-CM

## 2017-06-19 DIAGNOSIS — I5032 Chronic diastolic (congestive) heart failure: Secondary | ICD-10-CM | POA: Diagnosis not present

## 2017-06-19 DIAGNOSIS — Z992 Dependence on renal dialysis: Secondary | ICD-10-CM

## 2017-06-19 DIAGNOSIS — K279 Peptic ulcer, site unspecified, unspecified as acute or chronic, without hemorrhage or perforation: Secondary | ICD-10-CM | POA: Diagnosis not present

## 2017-06-19 DIAGNOSIS — S299XXA Unspecified injury of thorax, initial encounter: Secondary | ICD-10-CM | POA: Diagnosis not present

## 2017-06-19 DIAGNOSIS — E119 Type 2 diabetes mellitus without complications: Secondary | ICD-10-CM | POA: Diagnosis not present

## 2017-06-19 DIAGNOSIS — E875 Hyperkalemia: Secondary | ICD-10-CM | POA: Diagnosis not present

## 2017-06-19 DIAGNOSIS — I10 Essential (primary) hypertension: Secondary | ICD-10-CM | POA: Diagnosis not present

## 2017-06-19 LAB — POTASSIUM (ARMC VASCULAR LAB ONLY): Potassium (ARMC vascular lab): 4.8 (ref 3.5–5.1)

## 2017-06-19 SURGERY — PERIPHERAL VASCULAR THROMBECTOMY
Anesthesia: Moderate Sedation | Laterality: Left

## 2017-06-19 MED ORDER — FAMOTIDINE 20 MG PO TABS: 40.0000 mg | ORAL_TABLET | ORAL | Status: DC | PRN

## 2017-06-19 MED ORDER — SODIUM CHLORIDE 0.9 % IV SOLN: INTRAVENOUS | Status: DC

## 2017-06-19 MED ORDER — ONDANSETRON HCL 4 MG/2ML IJ SOLN: 4.0000 mg | Freq: Four times a day (QID) | INTRAMUSCULAR | Status: DC | PRN

## 2017-06-19 MED ORDER — METHYLPREDNISOLONE SODIUM SUCC 125 MG IJ SOLR: 125.0000 mg | INTRAMUSCULAR | Status: DC | PRN

## 2017-06-19 MED ORDER — HYDROMORPHONE HCL 1 MG/ML IJ SOLN: 1.0000 mg | Freq: Once | INTRAMUSCULAR | Status: DC | PRN

## 2017-06-19 NOTE — H&P (Signed)
[facility] Admission History & Physical  MRN : [id number]  Melissa Bradshaw is a 66 y.o. (September 20, 1950) female who presents with chief complaint of No chief complaint on file. Marland Kitchen  History of Present Illness: I am asked to evaluate the patient by the dialysis center. The patient was sent here because they were unable to cannulate the access Friday morning. Furthermore the Center states there is no thrill or bruit. The patient states this is the first dialysis run to be missed. This problem is acute in onset and has been present for approximately 3 days. The patient is unaware of any other change.  Patient denies pain or tenderness overlying the access.  There is no pain with dialysis.  The patient denies hand pain or finger pain consistent with steal syndrome.   There have not any past interventions or declots of this access.  The patient is not chronically hypotensive on dialysis.  Current Facility-Administered Medications  Medication Dose Route Frequency Provider Last Rate Last Dose  . 0.9 %  sodium chloride infusion   Intravenous Continuous Stegmayer, Kimberly A, PA-C      . ceFAZolin (ANCEF) IVPB 1 g/50 mL premix  1 g Intravenous Once Stegmayer, Kimberly A, PA-C      . famotidine (PEPCID) tablet 40 mg  40 mg Oral PRN Stegmayer, Ranae Plumber, PA-C      . HYDROmorphone (DILAUDID) injection 1 mg  1 mg Intravenous Once PRN Stegmayer, Kimberly A, PA-C      . methylPREDNISolone sodium succinate (SOLU-MEDROL) 125 mg/2 mL injection 125 mg  125 mg Intravenous PRN Stegmayer, Kimberly A, PA-C      . ondansetron (ZOFRAN) injection 4 mg  4 mg Intravenous Q6H PRN Stegmayer, Ranae Plumber, PA-C        Past Medical History:  Diagnosis Date  . Anemia   . Aneurysm (HCC)    Cerebral, [facility] [place], [place]  . Arthritis   . Cardiac pacemaker   . CHF (congestive heart failure) (HCC)   . Chronic kidney disease    ESRD  . Constipation   . Coronary artery disease   . Dialysis  patient (HCC)    Tues, Thurs, Sat  . Gastric ulcer   . Glaucoma (increased eye pressure)   . Headache   . Hypertension   . Presence of permanent cardiac pacemaker     Past Surgical History:  Procedure Laterality Date  . ANEURYSM COILING    . AV FISTULA PLACEMENT Left 07/20/2016   Procedure: ARTERIOVENOUS (AV) FISTULA CREATION;  Surgeon: Renford Dills, MD;  Location: [facility] ORS;  Service: Vascular;  Laterality: Left;  . AV FISTULA PLACEMENT Left 08/26/2016   Procedure: INSERTION OF ARTERIOVENOUS (AV) GORE-TEX GRAFT ARM;  Surgeon: Renford Dills, MD;  Location: [facility] ORS;  Service: Vascular;  Laterality: Left;  . COLONOSCOPY     2013  . DIALYSIS/PERMA CATHETER REMOVAL N/A 10/11/2016   Procedure: Dialysis/Perma Catheter Removal;  Surgeon: Renford Dills, MD;  Location: [facility] INVASIVE CV LAB;  Service: Cardiovascular;  Laterality: N/A;  . INSERT / REPLACE / REMOVE PACEMAKER    . IR RADIOLOGIST EVAL & MGMT  05/18/2017  . PACEMAKER PLACEMENT    . PERIPHERAL VASCULAR CATHETERIZATION N/A 06/02/2016   Procedure: Dialysis/Perma Catheter Insertion;  Surgeon: Annice Needy, MD;  Location: [facility] INVASIVE CV LAB;  Service: Cardiovascular;  Laterality: N/A;    Social History Social History   Tobacco Use  . Smoking status: Former Smoker    Packs/day:  0.25    Years: 41.00    Pack years: 10.25    Types: Cigarettes    Last attempt to quit: 2007    Years since quitting: 11.9  . Smokeless tobacco: Never Used  . Tobacco comment: pt lives with people who smoke in house  Substance Use Topics  . Alcohol use: No  . Drug use: No    Family History Family History  Problem Relation Age of Onset  . Hypertension Mother   . Diabetes Father   . Cancer Sister   . Stroke Sister   . Hypertension Sister     No family history of bleeding or clotting disorders, autoimmune disease or porphyria  Allergies  Allergen Reactions  . Hydralazine Hcl Other (See Comments)    Makes pt jittery, nervous, messes  with vision   . Iodine Itching and Rash    Unknown reaction  . Ivp Dye [Iodinated Diagnostic Agents] Rash     REVIEW OF SYSTEMS (Negative unless checked)  Constitutional: [] Weight loss  [] Fever  [] Chills Cardiac: [] Chest pain   [] Chest pressure   [x] Palpitations   [] Shortness of breath when laying flat   [] Shortness of breath at rest   [x] Shortness of breath with exertion. Vascular:  [] Pain in legs with walking   [] Pain in legs at rest   [] Pain in legs when laying flat   [] Claudication   [] Pain in feet when walking  [] Pain in feet at rest  [] Pain in feet when laying flat   [] History of DVT   [] Phlebitis   [x] Swelling in legs   [] Varicose veins   [] Non-healing ulcers Pulmonary:   [] Uses home oxygen   [] Productive cough   [] Hemoptysis   [] Wheeze  [] COPD   [] Asthma Neurologic:  [] Dizziness  [] Blackouts   [] Seizures   [] History of stroke   [] History of TIA  [] Aphasia   [] Temporary blindness   [] Dysphagia   [] Weakness or numbness in arms   [] Weakness or numbness in legs Musculoskeletal:  [x] Arthritis   [] Joint swelling   [] Joint pain   [] Low back pain Hematologic:  [] Easy bruising  [] Easy bleeding   [] Hypercoagulable state   [x] Anemic  [] Hepatitis Gastrointestinal:  [] Blood in stool   [] Vomiting blood  [] Gastroesophageal reflux/heartburn   [] Difficulty swallowing. Genitourinary:  [x] Chronic kidney disease   [] Difficult urination  [] Frequent urination  [] Burning with urination   [] Blood in urine Skin:  [] Rashes   [] Ulcers   [] Wounds Psychological:  [] History of anxiety   []  History of major depression.  Physical Examination  There were no vitals filed for this visit. There is no height or weight on file to calculate BMI. Gen: WD/WN, NAD Head: [place]/AT, No temporalis wasting.  Ear/Nose/Throat: Hearing grossly intact, nares w/o erythema or drainage, oropharynx w/o Erythema/Exudate,  Eyes: Conjunctiva clear, sclera non-icteric Neck: Trachea midline.  No JVD.  Pulmonary:  Good air movement,  respirations not labored, no use of accessory muscles.  Cardiac: RRR, normal S1, S2. Vascular: no thrill in left arm AVG Vessel Right Left  Radial Palpable Palpable               Musculoskeletal: M/S 5/5 throughout.  Extremities without ischemic changes.  No deformity or atrophy.  Neurologic: Sensation grossly intact in extremities.  Symmetrical.  Speech is fluent. Motor exam as listed above. Psychiatric: Judgment intact, Mood & affect appropriate for pt's clinical situation. Dermatologic: No rashes or ulcers noted.  No cellulitis or open wounds.    CBC Lab Results  Component Value Date  WBC 4.6 06/14/2017   HGB 13.4 06/14/2017   HCT 40.1 06/14/2017   MCV 86.9 06/14/2017   PLT 248 06/14/2017    BMET    Component Value Date/Time   NA 135 06/14/2017 1143   NA 141 01/30/2014 1505   K 4.5 06/14/2017 1143   K 5.0 01/30/2014 1505   CL 96 (L) 06/14/2017 1143   CL 116 (H) 01/30/2014 1505   CO2 24 06/14/2017 1143   CO2 17 (L) 01/30/2014 1505   GLUCOSE 82 06/14/2017 1143   GLUCOSE 93 01/30/2014 1505   BUN 47 (H) 06/14/2017 1143   BUN 50 (H) 06/19/2014 1413   CREATININE 11.61 (H) 06/14/2017 1143   CREATININE 3.33 (H) 06/19/2014 1413   CALCIUM 7.2 (L) 06/14/2017 1143   CALCIUM 8.8 01/30/2014 1505   GFRNONAA 3 (L) 06/14/2017 1143   GFRNONAA 15 (L) 06/19/2014 1413   GFRNONAA 14 (L) 01/30/2014 1505   GFRAA 3 (L) 06/14/2017 1143   GFRAA 18 (L) 06/19/2014 1413   GFRAA 16 (L) 01/30/2014 1505   Estimated Creatinine Clearance: 4.1 mL/min (A) (by C-G formula based on SCr of 11.61 mg/dL (H)).  COAG Lab Results  Component Value Date   INR 0.95 06/14/2017   INR 1.11 07/13/2016    Radiology Ct Abdomen Pelvis Wo Contrast  Result Date: 06/01/2017 CLINICAL DATA:  Diarrhea with abdominal pain and nausea EXAM: CT ABDOMEN AND PELVIS WITHOUT CONTRAST TECHNIQUE: Multidetector CT imaging of the abdomen and pelvis was performed following the standard protocol without IV contrast.  COMPARISON:  06/05/2016, 06/06/2015 FINDINGS: Lower chest: Lung bases demonstrate no acute consolidation or pleural effusion. Partially visualized cardiac pacing leads. Trace pericardial effusion. Hepatobiliary: Innumerable cysts throughout the liver parenchyma. Liver is enlarged. Some of the cysts demonstrate increased density. For example 9 cm anterior cyst, series 2, image number 41. Left lobe cyst measuring 4.9 cm. Gallbladder poorly visualized due to the numerous cysts and ascites in the abdomen. No biliary dilatation. Pancreas: Unremarkable. No pancreatic ductal dilatation or surrounding inflammatory changes. Spleen: Normal in size without focal abnormality. Adrenals/Urinary Tract: Difficult visualization of right adrenal gland. Left adrenal gland appears within normal limits. Enlarged polycystic kidneys bilaterally with scattered calcifications and areas of increased density as before. The bladder is unremarkable. Stomach/Bowel: The stomach is nonenlarged. No dilated small bowel. No colon wall thickening. Normal appendix. Vascular/Lymphatic: Extensive atherosclerotic vascular disease. No significantly enlarged lymph nodes Reproductive: Uterus and bilateral adnexa are unremarkable. Other: Moderate to large volume of ascites in the abdomen and pelvis. No free air. Fluid in the umbilical region. Musculoskeletal: Degenerative changes. No acute or suspicious lesion IMPRESSION: 1. Negative for bowel obstruction or bowel wall thickening. Normal appendix. 2. Polycystic kidneys as before. Polycystic liver. Several of the liver cysts demonstrate increased internal density suggesting hemorrhage or proteinaceous debris. 3. Moderate to large volume of ascites. Electronically Signed   By: Jasmine PangKim  Fujinaga M.D.   On: 06/01/2017 22:19   [place] Abdomen Limited  Result Date: 06/02/2017 CLINICAL DATA:  Recurrent ascites. Paracentesis on 05/30/2017. Nausea and pain. EXAM: LIMITED ABDOMEN ULTRASOUND FOR ASCITES TECHNIQUE: Limited  ultrasound survey for ascites was performed in all four abdominal quadrants. COMPARISON:  06/01/2017 and 05/30/2017 FINDINGS: Small amount of ascites in the right upper quadrant and right lower quadrant. No significant ascites in the left upper quadrant or left lower quadrant. Again noted are multiple hepatic cysts. IMPRESSION: Small amount of ascites in the right abdomen. Paracentesis was not performed due to the small volume. Electronically Signed  By: Richarda Overlie M.D.   On: 06/02/2017 15:19   US Paracentesis  Result Date: 06/14/2017 CLINICAL DATA:  Chronic renal disease, recurrent abdominal ascites EXAM: ULTRASOUND GUIDED PARACENTESIS TECHNIQUE: The procedure, risks (including but not limited to bleeding, infection, organ damage ), benefits, and alternatives were explained to the patient. Questions regarding the procedure were encouraged and answered. The patient understands and consents to the procedure. Survey ultrasound of the abdomen was performed and an appropriate skin entry site in the right lower abdomen was selected. Skin site was marked, prepped with chlorhexadine, draped in usual sterile fashion, and infiltrated locally with 1% lidocaine. A Safe-T-Centesis needle was advanced into the peritoneal space until fluid could be aspirated. The sheath was advanced and the needle removed. 5.8 L of clear yellow ascites were aspirated. The patient tolerated the procedure well. COMPLICATIONS: COMPLICATIONS none IMPRESSION: Technically successful ultrasound guided paracentesis, removing 5.8 L ascites. Electronically Signed   By: Corlis Leak M.D.   On: 06/14/2017 16:13   US Paracentesis  Result Date: 05/30/2017 INDICATION: End stage renal disease on hemodialysis. Congestive heart failure. Recurrent abdominal ascites. Request for therapeutic paracentesis. EXAM: ULTRASOUND GUIDED LEFT LATERAL ABDOMEN PARACENTESIS MEDICATIONS: 1% Lidocaine = 15 mL COMPLICATIONS: None immediate. PROCEDURE: Informed written  consent was obtained from the patient after a discussion of the risks, benefits and alternatives to treatment. A timeout was performed prior to the initiation of the procedure. Initial ultrasound scanning demonstrates a large amount of ascites within the left lateral abdomen. The left lateral abdomen was prepped and draped in the usual sterile fashion. 1% lidocaine with epinephrine was used for local anesthesia. Following this, a 19 gauge, 10-cm, Yueh catheter was introduced. An ultrasound image was saved for documentation purposes. The paracentesis was performed. The catheter was removed and a dressing was applied. The patient tolerated the procedure well without immediate post procedural complication. FINDINGS: A total of approximately 6 liters of clear yellow fluid was removed. IMPRESSION: Successful ultrasound-guided paracentesis yielding 6 liters of peritoneal fluid. Read by:  Corrin Parker, PA-C Electronically Signed   By: Gilmer Mor D.O.   On: 05/30/2017 11:31   US Paracentesis  Result Date: 05/23/2017 INDICATION: Abdominal distention secondary to recurrent ascites. Request therapeutic paracentesis up to 6 L max. EXAM: ULTRASOUND GUIDED RIGHT LOWER QUADRANT PARACENTESIS MEDICATIONS: None. COMPLICATIONS: None immediate. PROCEDURE: Informed written consent was obtained from the patient after a discussion of the risks, benefits and alternatives to treatment. A timeout was performed prior to the initiation of the procedure. Initial ultrasound scanning demonstrates a large amount of ascites within the right lower abdominal quadrant. The right lower abdomen was prepped and draped in the usual sterile fashion. 1% lidocaine with epinephrine was used for local anesthesia. Following this, a Safe-T-Centesis catheter was introduced. An ultrasound image was saved for documentation purposes. The paracentesis was performed. The catheter was removed and a dressing was applied. The patient tolerated the procedure well  without immediate post procedural complication. FINDINGS: A total of approximately 6 L of clear yellow fluid was removed. IMPRESSION: Successful ultrasound-guided paracentesis yielding 6 liters of peritoneal fluid. Read by: Brayton El PA-C Electronically Signed   By: Richarda Overlie M.D.   On: 05/23/2017 11:10    Assessment/Plan 1.  Complication dialysis device with thrombosis AV access:  Patient's left arm AVG dialysis access is thrombosed. The patient will undergo thrombectomy using interventional techniques.  The risks and benefits were described to the patient.  All questions were answered.  The patient agrees to proceed  with angiography and intervention. Potassium will be drawn to ensure that it is an appropriate level prior to performing thrombectomy. 2.  End-stage renal disease requiring hemodialysis:  Patient will continue dialysis therapy without further interruption if a successful thrombectomy is not achieved then catheter will be placed. Dialysis has already been arranged since the patient missed their previous session 3.  Hypertension:  Patient will continue medical management; nephrology is following no changes in oral medications. 4.  Coronary artery disease:  EKG will be monitored. Nitrates will be used if needed. The patient's oral cardiac medications will be continued.    Festus Barren, MD  06/19/2017 10:05 AM

## 2017-06-20 ENCOUNTER — Telehealth (INDEPENDENT_AMBULATORY_CARE_PROVIDER_SITE_OTHER): Payer: Self-pay | Admitting: Vascular Surgery

## 2017-06-20 ENCOUNTER — Ambulatory Visit: Payer: Medicare Other

## 2017-06-20 NOTE — Telephone Encounter (Signed)
error 

## 2017-06-21 ENCOUNTER — Emergency Department: Payer: Medicare Other

## 2017-06-21 ENCOUNTER — Encounter: Payer: Self-pay | Admitting: *Deleted

## 2017-06-21 ENCOUNTER — Other Ambulatory Visit: Payer: Self-pay

## 2017-06-21 ENCOUNTER — Inpatient Hospital Stay
Admission: EM | Admit: 2017-06-21 | Discharge: 2017-06-23 | DRG: 314 | Disposition: A | Discharge status: Home / Self Care | Payer: Medicare Other | Attending: Internal Medicine | Admitting: Internal Medicine

## 2017-06-21 DIAGNOSIS — Z79899 Other long term (current) drug therapy: Secondary | ICD-10-CM | POA: Diagnosis not present

## 2017-06-21 DIAGNOSIS — I132 Hypertensive heart and chronic kidney disease with heart failure and with stage 5 chronic kidney disease, or end stage renal disease: Secondary | ICD-10-CM | POA: Diagnosis present

## 2017-06-21 DIAGNOSIS — M1990 Unspecified osteoarthritis, unspecified site: Secondary | ICD-10-CM | POA: Diagnosis present

## 2017-06-21 DIAGNOSIS — Z7982 Long term (current) use of aspirin: Secondary | ICD-10-CM | POA: Diagnosis not present

## 2017-06-21 DIAGNOSIS — Z950 Presence of cardiac pacemaker: Secondary | ICD-10-CM | POA: Diagnosis not present

## 2017-06-21 DIAGNOSIS — Z992 Dependence on renal dialysis: Secondary | ICD-10-CM

## 2017-06-21 DIAGNOSIS — D631 Anemia in chronic kidney disease: Secondary | ICD-10-CM | POA: Diagnosis present

## 2017-06-21 DIAGNOSIS — T8249XD Other complication of vascular dialysis catheter, subsequent encounter: Secondary | ICD-10-CM

## 2017-06-21 DIAGNOSIS — Z888 Allergy status to other drugs, medicaments and biological substances status: Secondary | ICD-10-CM

## 2017-06-21 DIAGNOSIS — Z9119 Patient's noncompliance with other medical treatment and regimen: Secondary | ICD-10-CM

## 2017-06-21 DIAGNOSIS — R188 Other ascites: Secondary | ICD-10-CM | POA: Diagnosis present

## 2017-06-21 DIAGNOSIS — Z8249 Family history of ischemic heart disease and other diseases of the circulatory system: Secondary | ICD-10-CM

## 2017-06-21 DIAGNOSIS — I2510 Atherosclerotic heart disease of native coronary artery without angina pectoris: Secondary | ICD-10-CM | POA: Diagnosis present

## 2017-06-21 DIAGNOSIS — N2581 Secondary hyperparathyroidism of renal origin: Secondary | ICD-10-CM | POA: Diagnosis present

## 2017-06-21 DIAGNOSIS — T829XXA Unspecified complication of cardiac and vascular prosthetic device, implant and graft, initial encounter: Secondary | ICD-10-CM | POA: Diagnosis not present

## 2017-06-21 DIAGNOSIS — H409 Unspecified glaucoma: Secondary | ICD-10-CM | POA: Diagnosis present

## 2017-06-21 DIAGNOSIS — Z91041 Radiographic dye allergy status: Secondary | ICD-10-CM

## 2017-06-21 DIAGNOSIS — E875 Hyperkalemia: Secondary | ICD-10-CM | POA: Diagnosis present

## 2017-06-21 DIAGNOSIS — E872 Acidosis: Secondary | ICD-10-CM | POA: Diagnosis present

## 2017-06-21 DIAGNOSIS — T82868A Thrombosis of vascular prosthetic devices, implants and grafts, initial encounter: Secondary | ICD-10-CM | POA: Diagnosis present

## 2017-06-21 DIAGNOSIS — N186 End stage renal disease: Secondary | ICD-10-CM | POA: Diagnosis present

## 2017-06-21 DIAGNOSIS — K429 Umbilical hernia without obstruction or gangrene: Secondary | ICD-10-CM | POA: Diagnosis present

## 2017-06-21 DIAGNOSIS — I5032 Chronic diastolic (congestive) heart failure: Secondary | ICD-10-CM | POA: Diagnosis present

## 2017-06-21 DIAGNOSIS — K279 Peptic ulcer, site unspecified, unspecified as acute or chronic, without hemorrhage or perforation: Secondary | ICD-10-CM | POA: Diagnosis present

## 2017-06-21 DIAGNOSIS — F17210 Nicotine dependence, cigarettes, uncomplicated: Secondary | ICD-10-CM | POA: Diagnosis present

## 2017-06-21 DIAGNOSIS — Z9115 Patient's noncompliance with renal dialysis: Secondary | ICD-10-CM | POA: Diagnosis not present

## 2017-06-21 LAB — CBC WITH DIFFERENTIAL/PLATELET
Basophils Absolute: 0.1 10*3/uL (ref 0–0.1)
Basophils Relative: 1 %
Eosinophils Absolute: 0.1 10*3/uL (ref 0–0.7)
Eosinophils Relative: 1 %
HCT: 40.4 % (ref 35.0–47.0)
HEMOGLOBIN: 13.4 g/dL (ref 12.0–16.0)
LYMPHS ABS: 1.4 10*3/uL (ref 1.0–3.6)
LYMPHS PCT: 27 %
MCH: 29.1 pg (ref 26.0–34.0)
MCHC: 33.2 g/dL (ref 32.0–36.0)
MCV: 87.6 fL (ref 80.0–100.0)
Monocytes Absolute: 0.5 10*3/uL (ref 0.2–0.9)
Monocytes Relative: 10 %
NEUTROS PCT: 61 %
Neutro Abs: 3.3 10*3/uL (ref 1.4–6.5)
Platelets: 386 10*3/uL (ref 150–440)
RBC: 4.62 MIL/uL (ref 3.80–5.20)
RDW: 16.3 % — ABNORMAL HIGH (ref 11.5–14.5)
WBC: 5.4 10*3/uL (ref 3.6–11.0)

## 2017-06-21 LAB — BASIC METABOLIC PANEL
Anion gap: 14 (ref 5–15)
BUN: 88 mg/dL — AB (ref 6–20)
CHLORIDE: 99 mmol/L — AB (ref 101–111)
CO2: 21 mmol/L — ABNORMAL LOW (ref 22–32)
Calcium: 6.1 mg/dL — CL (ref 8.9–10.3)
Creatinine, Ser: 18.48 mg/dL — ABNORMAL HIGH (ref 0.44–1.00)
GFR calc non Af Amer: 2 mL/min — ABNORMAL LOW (ref >60)
GFR, EST AFRICAN AMERICAN: 2 mL/min — AB (ref >60)
Glucose, Bld: 95 mg/dL (ref 65–99)
POTASSIUM: 7.1 mmol/L — AB (ref 3.5–5.1)
SODIUM: 134 mmol/L — AB (ref 135–145)

## 2017-06-21 LAB — PROTIME-INR
INR: 1.02
Prothrombin Time: 13.3 seconds (ref 11.4–15.2)

## 2017-06-21 MED ORDER — HEPARIN SODIUM (PORCINE) 5000 UNIT/ML IJ SOLN
5000.0000 [IU] | Freq: Three times a day (TID) | INTRAMUSCULAR | Status: DC
  Administered 2017-06-22: 5000 [IU] via SUBCUTANEOUS
  Filled 2017-06-21: qty 1

## 2017-06-21 MED ORDER — SODIUM CHLORIDE 0.9% FLUSH: 3.0000 mL | Freq: Two times a day (BID) | INTRAVENOUS | Status: DC

## 2017-06-21 MED ORDER — PANTOPRAZOLE SODIUM 40 MG PO TBEC
40.0000 mg | DELAYED_RELEASE_TABLET | Freq: Every day | ORAL | Status: DC
  Administered 2017-06-21 – 2017-06-22 (×2): 40 mg via ORAL
  Filled 2017-06-21 (×2): qty 1

## 2017-06-21 MED ORDER — CALCIUM ACETATE (PHOS BINDER) 667 MG PO CAPS
2001.0000 mg | ORAL_CAPSULE | Freq: Three times a day (TID) | ORAL | Status: DC
  Administered 2017-06-22 – 2017-06-23 (×2): 2001 mg via ORAL
  Filled 2017-06-21 (×3): qty 3

## 2017-06-21 MED ORDER — SODIUM CHLORIDE 0.9 % IV SOLN: 250.0000 mL | INTRAVENOUS | Status: DC | PRN

## 2017-06-21 MED ORDER — SODIUM POLYSTYRENE SULFONATE 15 GM/60ML PO SUSP
30.0000 g | Freq: Once | ORAL | Status: AC
  Administered 2017-06-21: 30 g via ORAL
  Filled 2017-06-21: qty 120

## 2017-06-21 MED ORDER — ACETAMINOPHEN 650 MG RE SUPP
650.0000 mg | Freq: Four times a day (QID) | RECTAL | Status: DC | PRN
Start: 2017-06-21 — End: 2017-06-24

## 2017-06-21 MED ORDER — ACETAMINOPHEN 325 MG PO TABS: 650.0000 mg | ORAL_TABLET | Freq: Four times a day (QID) | ORAL | Status: DC | PRN

## 2017-06-21 MED ORDER — SODIUM CHLORIDE 0.9% FLUSH
3.0000 mL | Freq: Two times a day (BID) | INTRAVENOUS | Status: DC
  Administered 2017-06-23: 3 mL via INTRAVENOUS

## 2017-06-21 MED ORDER — ALBUTEROL SULFATE (2.5 MG/3ML) 0.083% IN NEBU
10.0000 mg | INHALATION_SOLUTION | Freq: Once | RESPIRATORY_TRACT | Status: AC
Start: 2017-06-21 — End: 2017-06-21
  Administered 2017-06-21: 10 mg via RESPIRATORY_TRACT
  Filled 2017-06-21: qty 12

## 2017-06-21 MED ORDER — LATANOPROST 0.005 % OP SOLN: 1.0000 [drp] | Freq: Every day | OPHTHALMIC | Status: DC

## 2017-06-21 MED ORDER — ONDANSETRON HCL 4 MG PO TABS
4.0000 mg | ORAL_TABLET | Freq: Four times a day (QID) | ORAL | Status: DC | PRN
  Administered 2017-06-23: 4 mg via ORAL
  Filled 2017-06-21: qty 1

## 2017-06-21 MED ORDER — ONDANSETRON HCL 4 MG/2ML IJ SOLN
4.0000 mg | Freq: Four times a day (QID) | INTRAMUSCULAR | Status: DC | PRN
  Administered 2017-06-22 – 2017-06-23 (×2): 4 mg via INTRAVENOUS
  Filled 2017-06-21 (×2): qty 2

## 2017-06-21 MED ORDER — DOCUSATE SODIUM 100 MG PO CAPS
100.0000 mg | ORAL_CAPSULE | ORAL | Status: DC
  Administered 2017-06-22: 100 mg via ORAL
  Filled 2017-06-21: qty 1

## 2017-06-21 MED ORDER — SODIUM CHLORIDE 0.9% FLUSH: 3.0000 mL | INTRAVENOUS | Status: DC | PRN

## 2017-06-21 MED ORDER — DEXTROSE 50 % IV SOLN
2.0000 | Freq: Once | INTRAVENOUS | Status: AC
  Administered 2017-06-21: 100 mL via INTRAVENOUS
  Filled 2017-06-21: qty 100

## 2017-06-21 MED ORDER — HYDROCODONE-ACETAMINOPHEN 5-325 MG PO TABS
1.0000 | ORAL_TABLET | ORAL | Status: DC | PRN
  Administered 2017-06-22 – 2017-06-23 (×4): 2 via ORAL
  Filled 2017-06-21 (×4): qty 2

## 2017-06-21 MED ORDER — CALCIUM GLUCONATE 10 % IV SOLN
1.0000 g | Freq: Once | INTRAVENOUS | Status: AC
Start: 2017-06-21 — End: 2017-06-21
  Administered 2017-06-21: 1 g via INTRAVENOUS
  Filled 2017-06-21: qty 10

## 2017-06-21 MED ORDER — SODIUM BICARBONATE 8.4 % IV SOLN
100.0000 meq | Freq: Once | INTRAVENOUS | Status: AC
  Administered 2017-06-21: 100 meq via INTRAVENOUS
  Filled 2017-06-21: qty 50

## 2017-06-21 MED ORDER — INSULIN ASPART 100 UNIT/ML IV SOLN
10.0000 [IU] | Freq: Once | INTRAVENOUS | Status: DC
  Filled 2017-06-21: qty 0.1

## 2017-06-21 MED ORDER — FUROSEMIDE 20 MG PO TABS
20.0000 mg | ORAL_TABLET | Freq: Every day | ORAL | Status: DC
  Administered 2017-06-22: 20 mg via ORAL
  Filled 2017-06-21: qty 1

## 2017-06-21 NOTE — ED Provider Notes (Signed)
[facility] Emergency Department Provider Note  ____________________________________________  Time seen: Approximately 6:35 PM  I have reviewed the triage vital signs and the nursing notes.   HISTORY  Chief Complaint Evaluation - graft failed    HPI Melissa Bradshaw is a 66 y.o. female with a history of end-stage renal disease on hemodialysis, smoker,sent to the ED by her doctor for dialysis access issues. The patient reports that she hasn't had dialysis in about 3 weeks because of difficulty with her graft. They told her that it was thrombosed 2 weeks ago and she's been unable to use it. She denies any complaints.  Review of electronic medical records shows that Dr. Wyn Quaker evaluated her 2 days ago at dialysis, found that graft to be thrombosed and plan to do a thrombectomy and possible dialysis catheter placement. However, I don't see any further notes explaining why that has not yet occurred. No recent lab work available. Patient denies dizziness palpitations chest pain shortness of breath.  issue is been constant, no aggravating or alleviating factors. Ongoing for 2-3 weeks     Past Medical History:  Diagnosis Date  . Anemia   . Aneurysm (HCC)    Cerebral, [place], [place]  . Arthritis   . Cardiac pacemaker   . CHF (congestive heart failure) (HCC)   . Chronic kidney disease    ESRD  . Constipation   . Coronary artery disease   . Dialysis patient (HCC)    Tues, Thurs, Sat  . Gastric ulcer   . Glaucoma (increased eye pressure)   . Headache   . Hypertension   . Presence of permanent cardiac pacemaker      Patient Active Problem List   Diagnosis Date Noted  . Abdominal pain, epigastric 06/08/2017  . Hyperkalemia 06/01/2017  . Ascites 03/29/2017  . Polycystic liver disease 09/30/2016  . Pre-transplant evaluation for kidney transplant 09/30/2016  . Complication of vascular access for dialysis 08/15/2016  . Anemia 08/08/2016  .  Protein-calorie malnutrition, severe 06/01/2016  . Fluid overload 05/31/2016  . Anemia, chronic renal failure 01/31/2016  . End stage renal disease (HCC) 01/01/2015  . Iron deficiency anemia 11/01/2014  . Essential (primary) hypertension 11/08/2012  . Artificial cardiac pacemaker 11/08/2012  . Congenital cystic disease of kidney 11/08/2012  . Temporary cerebral vascular dysfunction 11/08/2012  . Tobacco abuse, in remission 11/08/2012  . 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 Abnormal presence of protein in urine 09/21/2011  . ADPKD (autosomal dominant polycystic kidney disease) 06/07/2011     Past Surgical History:  Procedure Laterality Date  . ANEURYSM COILING    . AV FISTULA PLACEMENT Left 07/20/2016   Procedure: ARTERIOVENOUS (AV) FISTULA CREATION;  Surgeon: Renford Dills, MD;  Location: [facility] ORS;  Service: Vascular;  Laterality: Left;  . AV FISTULA PLACEMENT Left 08/26/2016   Procedure: INSERTION OF ARTERIOVENOUS (AV) GORE-TEX GRAFT ARM;  Surgeon: Renford Dills, MD;  Location: [facility] ORS;  Service: Vascular;  Laterality: Left;  . COLONOSCOPY     2013  . DIALYSIS/PERMA CATHETER REMOVAL N/A 10/11/2016   Procedure: Dialysis/Perma Catheter Removal;  Surgeon: Renford Dills, MD;  Location: [facility] INVASIVE CV LAB;  Service: Cardiovascular;  Laterality: N/A;  . INSERT / REPLACE / REMOVE PACEMAKER    . IR RADIOLOGIST EVAL & MGMT  05/18/2017  . PACEMAKER PLACEMENT    . PERIPHERAL VASCULAR CATHETERIZATION N/A 06/02/2016   Procedure: Dialysis/Perma Catheter Insertion;  Surgeon: Annice Needy, MD;  [facility] INVASIVE  CV LAB;  Service: Cardiovascular;  Laterality: N/A;     Prior to Admission medications   Medication Sig Start Date End Date Taking? Authorizing Provider  aspirin 81 MG tablet Take 81 mg by mouth daily.   Yes [provider]  calcium acetate (PHOSLO) 667 MG capsule Take 2,001 mg by mouth 3 (three) times daily with meals.    Yes [provider]  docusate sodium (COLACE) 100 MG capsule Take 1 capsule (100 mg total) by mouth 2 (two) times daily. Patient taking differently: Take 100 mg by mouth every other day.  06/03/17  Yes Enid BaasKalisetti, Radhika, MD  furosemide (LASIX) 20 MG tablet Take 20 mg by mouth daily.  05/17/16  Yes [provider]  latanoprost (XALATAN) 0.005 % ophthalmic solution Place 1 drop into both eyes at bedtime.   Yes [provider]  lidocaine-prilocaine (EMLA) cream  11/01/16   [provider]  ondansetron (ZOFRAN ODT) 4 MG disintegrating tablet Take 1 tablet (4 mg total) by mouth every 8 (eight) hours as needed for nausea or vomiting. 06/03/17   Enid BaasKalisetti, Radhika, MD     Allergies Hydralazine hcl; Iodine; and Ivp dye [iodinated diagnostic agents]   Family History  Problem Relation Age of Onset  . Hypertension Mother   . Diabetes Father   . Cancer Sister   . Stroke Sister   . Hypertension Sister     Social History Social History   Tobacco Use  . Smoking status: Current Every Day Smoker    Packs/day: 0.25    Years: 28.00    Pack years: 7.00    Types: Cigarettes  . Smokeless tobacco: Never Used  . Tobacco comment: pt lives with people who smoke in house  Substance Use Topics  . Alcohol use: No  . Drug use: No    Review of Systems  Constitutional:   No fever or chills.  ENT:   No sore throat. positive nasal congestion Cardiovascular:   No chest pain or syncope. Respiratory:   No dyspnea or cough. Gastrointestinal:   positive abdominal swelling and cramping pain. No vomiting or diarrhea or constipation.  Musculoskeletal:   Negative for focal pain or swelling All other systems reviewed and are negative except as documented above in ROS and HPI.  ____________________________________________   PHYSICAL EXAM:  VITAL SIGNS: ED Triage Vitals  Enc Vitals Group     BP 06/21/17 1511 114/69     Pulse Rate 06/21/17 1511 64     Resp 06/21/17 1511 16     Temp 06/21/17 1511  98.1 F (36.7 C)     Temp Source 06/21/17 1511 Oral     SpO2 06/21/17 1511 98 %     Weight 06/21/17 1512 120 lb (54.4 kg)     Height 06/21/17 1512 5\' 6"  (1.676 m)     Head Circumference --      Peak Flow --      Pain Score --      Pain Loc --      Pain Edu? --      Excl. in GC? --     Vital signs reviewed, nursing assessments reviewed.   Constitutional:   Alert and oriented. Well appearing and in no distress. Eyes:   No scleral icterus.  EOMI. No nystagmus. No conjunctival pallor. PERRL. ENT   Head:   Normocephalic and atraumatic.   Nose:   No congestion/rhinnorhea.    Mouth/Throat:   MMM, no pharyngeal erythema. No peritonsillar mass.  Neck:   No meningismus. Full ROM. Hematological/Lymphatic/Immunilogical:   No cervical lymphadenopathy. Cardiovascular:   RRR. Symmetric bilateral radial and DP pulses.  No murmurs. AV graft and left upper arm without any palpable thrill Respiratory:   Normal respiratory effort without tachypnea/retractions. Breath sounds are clear and equal bilaterally. No wheezes/rales/rhonchi. Gastrointestinal:   Soft and nontender. mildly distended. There is no CVA tenderness.  No rebound, rigidity, or guarding.multiple ventral hernias, soft and reducible, not incarcerated Genitourinary:   deferred Musculoskeletal:   Normal range of motion in all extremities. No joint effusions.  No lower extremity tenderness.  No edema. Neurologic:   Normal speech and language.  Motor grossly intact. No gross focal neurologic deficits are appreciated.  Skin:    Skin is warm, dry and intact. No rash noted.  No petechiae, purpura, or bullae.  ____________________________________________    LABS (pertinent positives/negatives) (all labs ordered are listed, but only abnormal results are displayed) Labs Reviewed  BASIC METABOLIC PANEL - Abnormal; Notable for the following components:      Result Value   Sodium 134 (*)    Potassium 7.1 (*)    Chloride 99 (*)     CO2 21 (*)    BUN 88 (*)    Creatinine, Ser 18.48 (*)    Calcium 6.1 (*)    GFR calc non Af Amer 2 (*)    GFR calc Af Amer 2 (*)    All other components within normal limits  CBC WITH DIFFERENTIAL/PLATELET - Abnormal; Notable for the following components:   RDW 16.3 (*)    All other components within normal limits  PROTIME-INR   ____________________________________________   EKG  interpreted by me Atrial paced, rate of 60, right axis, normal intervals.poor R-wave progression in anterior precordial leads. No acute ischemic changes.  ____________________________________________    RADIOLOGY  Dg Chest Portable 1 View  Result Date: 06/21/2017 CLINICAL DATA:  Failed graft in left arm EXAM: PORTABLE CHEST 1 VIEW COMPARISON:  11/23/2016 FINDINGS: Left-sided pacing device as before. No acute pulmonary infiltrate or effusion. Normal cardiomediastinal silhouette with atherosclerosis. No pneumothorax. IMPRESSION: No active disease. Electronically Signed   By: Jasmine Pang M.D.   On: 06/21/2017 19:01    ____________________________________________   PROCEDURES Procedures CRITICAL CARE Performed by: Scotty Court, Adal Sereno   Total critical care time: 35 minutes  Critical care time was exclusive of separately billable procedures and treating other patients.  Critical care was necessary to treat or prevent imminent or life-threatening deterioration.  Critical care was time spent personally by me on the following activities: development of treatment plan with patient and/or surrogate as well as nursing, discussions with consultants, evaluation of patient's response to treatment, examination of patient, obtaining history from patient or surrogate, ordering and performing treatments and interventions, ordering and review of laboratory studies, ordering and review of radiographic studies, pulse oximetry and re-evaluation of patient's  condition.  ____________________________________________     CLINICAL IMPRESSION / ASSESSMENT AND PLAN / ED COURSE  Pertinent labs & imaging results that were available during my care of the patient were reviewed by me and considered in my medical decision making (see chart for details).   patient presents well appearing not in distress, normal vital signs, nonfunctioning dialysis access with prolonged period of time without any hemodialysis. We'll check her labs, discuss with vascular regarding the plan.  Clinical Course as of Jun 21 2041  Wed Jun 21, 2017  [id number] D/w Dr. Wyn Quaker. Apparently pt was planned for procedure 2 days ago  but ate right beforehand and didn't come with someone to drive her home after sedation.  She was rescheduled for today but didn't show. She can go back to Vascular tomorrow for procedure (NPO with ride) if labs are okay today.  [PS]  2033 Will tx hypercalemia with temporizing measures, kayexalate, plan to admit.  Potassium: (!!) 7.1 [PS]    Clinical Course User Index [PS] Sharman CheekStafford, Teanna Elem, MD    ----------------------------------------- 8:42 PM on 06/21/2017 -----------------------------------------  Nephrology paged to arrange dialysis. I will discuss with the hospitalist for further management.   ____________________________________________   FINAL CLINICAL IMPRESSION(S) / ED DIAGNOSES    Final diagnoses:  End stage renal disease on dialysis (HCC)  Clotted dialysis access, subsequent encounter (HCC)  Acute hyperkalemia      This SmartLink is deprecated. Use AVSMEDLIST instead to display the medication list for a patient.   Portions of this note were generated with dragon dictation software. Dictation errors may occur despite best attempts at proofreading.    Sharman CheekStafford, Reginald Weida, MD 06/21/17 2043

## 2017-06-21 NOTE — ED Notes (Signed)
PT reports she is a hard stick and doe snot want blood work drawn until we know the plan. RN will wait for MD to evaluate pt.

## 2017-06-21 NOTE — ED Triage Notes (Signed)
Pt to ED reporting she has a graft in left arm that failed 2 weeks ago. She reports he doctor told to come to the ED to have a new graft placed.   Pt reports she has not had dialysis for the past weeks but report she feels completely fine at this time. No medical complaints at this time.

## 2017-06-21 NOTE — H&P (Signed)
[employer] [facility] [facility] [place] [facility]   PATIENT NAME: Melissa Bradshaw    MR#:  [id number]  DATE OF BIRTH:  [birth year]  DATE OF ADMISSION:  06/21/2017  PRIMARY CARE PHYSICIAN: Corky Downs, MD   REQUESTING/REFERRING PHYSICIAN:   CHIEF COMPLAINT:   Chief Complaint  Patient presents with  . Evaluation - graft failed    HISTORY OF PRESENT ILLNESS: Melissa Bradshaw  is a 66 y.o. female with a known history per below sent by primary care provider's office for dysfunctional left arm AV fistula, has not had hemodialysis in over 2 weeks, in the emergency room potassium was 7.1, creatinine 18, BUN 88, patient in no apparent distress, resting comfortably in bed, patient is now been admitted for chronic end-stage renal disease on hemodialysis, acute dysfunctional left arm AV fistula, and acute hyperkalemia.  PAST MEDICAL HISTORY:   Past Medical History:  Diagnosis Date  . Anemia   . Aneurysm (HCC)    Cerebral, [place], [place]  . Arthritis   . Cardiac pacemaker   . CHF (congestive heart failure) (HCC)   . Chronic kidney disease    ESRD  . Constipation   . Coronary artery disease   . Dialysis patient (HCC)    Tues, Thurs, Sat  . Gastric ulcer   . Glaucoma (increased eye pressure)   . Headache   . Hypertension   . Presence of permanent cardiac pacemaker     PAST SURGICAL HISTORY:  Past Surgical History:  Procedure Laterality Date  . ANEURYSM COILING    . AV FISTULA PLACEMENT Left 07/20/2016   Procedure: ARTERIOVENOUS (AV) FISTULA CREATION;  Surgeon: Renford Dills, MD;  Location: [facility] ORS;  Service: Vascular;  Laterality: Left;  . AV FISTULA PLACEMENT Left 08/26/2016   Procedure: INSERTION OF ARTERIOVENOUS (AV) GORE-TEX GRAFT ARM;  Surgeon: Renford Dills, MD;  Location: [facility] ORS;  Service: Vascular;  Laterality: Left;  . COLONOSCOPY     2013  . DIALYSIS/PERMA CATHETER REMOVAL N/A 10/11/2016   Procedure: Dialysis/Perma Catheter Removal;  Surgeon: Renford Dills, MD;  Location: [facility] INVASIVE CV LAB;  Service: Cardiovascular;  Laterality: N/A;  . INSERT / REPLACE / REMOVE PACEMAKER    . IR RADIOLOGIST EVAL & MGMT  05/18/2017  . PACEMAKER PLACEMENT    . PERIPHERAL VASCULAR CATHETERIZATION N/A 06/02/2016   Procedure: Dialysis/Perma Catheter Insertion;  Surgeon: Annice Needy, MD;  Location: [facility] INVASIVE CV LAB;  Service: Cardiovascular;  Laterality: N/A;    SOCIAL HISTORY:  Social History   Tobacco Use  . Smoking status: Current Every Day Smoker    Packs/day: 0.25    Years: 28.00    Pack years: 7.00    Types: Cigarettes  . Smokeless tobacco: Never Used  . Tobacco comment: pt lives with people who smoke in house  Substance Use Topics  . Alcohol use: No    FAMILY HISTORY:  Family History  Problem Relation Age of Onset  . Hypertension Mother   . Diabetes Father   . Cancer Sister   . Stroke Sister   . Hypertension Sister     DRUG ALLERGIES:  Allergies  Allergen Reactions  . Hydralazine Hcl Other (See Comments)    Makes pt jittery, nervous, messes with vision   . Iodine Itching and Rash    Unknown reaction  . Ivp Dye [Iodinated Diagnostic Agents] Rash    REVIEW OF SYSTEMS:   CONSTITUTIONAL: No fever, fatigue or weakness.  EYES: No blurred or double vision.  EARS, NOSE, AND THROAT: No tinnitus or ear pain.  RESPIRATORY: No cough, shortness of breath, wheezing or hemoptysis.  CARDIOVASCULAR: No chest pain, orthopnea, edema.  GASTROINTESTINAL: No nausea, vomiting, diarrhea or abdominal pain.  GENITOURINARY: No dysuria, hematuria.  ENDOCRINE: No polyuria, nocturia,  HEMATOLOGY: No anemia, easy bruising or bleeding SKIN: No rash or lesion. MUSCULOSKELETAL: No joint pain or arthritis.   NEUROLOGIC: No tingling, numbness, weakness.  PSYCHIATRY: No anxiety or depression.   MEDICATIONS AT HOME:  Prior to Admission medications   Medication Sig Start Date End Date Taking? Authorizing Provider  aspirin 81 MG tablet Take 81  mg by mouth daily.   Yes [provider]  calcium acetate (PHOSLO) 667 MG capsule Take 2,001 mg by mouth 3 (three) times daily with meals.    Yes [provider]  docusate sodium (COLACE) 100 MG capsule Take 1 capsule (100 mg total) by mouth 2 (two) times daily. Patient taking differently: Take 100 mg by mouth every other day.  06/03/17  Yes Enid BaasKalisetti, Radhika, MD  furosemide (LASIX) 20 MG tablet Take 20 mg by mouth daily.  05/17/16  Yes [provider]  latanoprost (XALATAN) 0.005 % ophthalmic solution Place 1 drop into both eyes at bedtime.   Yes [provider]  lidocaine-prilocaine (EMLA) cream  11/01/16   [provider]  ondansetron (ZOFRAN ODT) 4 MG disintegrating tablet Take 1 tablet (4 mg total) by mouth every 8 (eight) hours as needed for nausea or vomiting. 06/03/17   Enid BaasKalisetti, Radhika, MD      PHYSICAL EXAMINATION:   VITAL SIGNS: Blood pressure 108/66, pulse 71, temperature 98.1 F (36.7 C), temperature source Oral, resp. rate 12, height 5\' 6"  (1.676 m), weight 54.4 kg (120 lb), SpO2 99 %.  GENERAL:  66 y.o.-year-old patient lying in the bed with no acute distress.  Frail-appearing EYES: Pupils equal, round, reactive to light and accommodation. No scleral icterus. Extraocular muscles intact.  HEENT: Head atraumatic, normocephalic. Oropharynx and nasopharynx clear.  NECK:  Supple, no jugular venous distention. No thyroid enlargement, no tenderness.  LUNGS: Normal breath sounds bilaterally, no wheezing, rales,rhonchi or crepitation. No use of accessory muscles of respiration.  CARDIOVASCULAR: S1, S2 normal. No murmurs, rubs, or gallops.  ABDOMEN: Soft, nontender, nondistended. Bowel sounds present. No organomegaly or mass.  EXTREMITIES: No pedal edema, cyanosis, or clubbing.  NEUROLOGIC: Cranial nerves II through XII are intact. MAES. Gait not checked.  PSYCHIATRIC: The patient is alert and oriented x 3.  SKIN: No obvious rash, lesion, or  ulcer.   LABORATORY PANEL:   CBC Recent Labs  Lab 06/21/17 1854  WBC 5.4  HGB 13.4  HCT 40.4  PLT 386  MCV 87.6  MCH 29.1  MCHC 33.2  RDW 16.3*  LYMPHSABS 1.4  MONOABS 0.5  EOSABS 0.1  BASOSABS 0.1   ------------------------------------------------------------------------------------------------------------------  Chemistries  Recent Labs  Lab 06/21/17 1854  NA 134*  K 7.1*  CL 99*  CO2 21*  GLUCOSE 95  BUN 88*  CREATININE 18.48*  CALCIUM 6.1*   ------------------------------------------------------------------------------------------------------------------ estimated creatinine clearance is 2.6 mL/min (A) (by C-G formula based on SCr of 18.48 mg/dL (H)). ------------------------------------------------------------------------------------------------------------------ No results for input(s): TSH, T4TOTAL, T3FREE, THYROIDAB in the last 72 hours.  Invalid input(s): FREET3   Coagulation profile Recent Labs  Lab 06/21/17 1854  INR 1.02   ------------------------------------------------------------------------------------------------------------------- No results for input(s): DDIMER in the last 72 hours. -------------------------------------------------------------------------------------------------------------------  Cardiac Enzymes No results for input(s): CKMB, TROPONINI, MYOGLOBIN in the last 168 hours.  Invalid input(s): CK ------------------------------------------------------------------------------------------------------------------ Invalid input(s): POCBNP  ---------------------------------------------------------------------------------------------------------------  Urinalysis    Component Value Date/Time   COLORURINE YELLOW (A) 04/24/2017 1136   APPEARANCEUR CLEAR (A) 04/24/2017 1136   LABSPEC 1.014 04/24/2017 1136   PHURINE 7.0 04/24/2017 1136   GLUCOSEU NEGATIVE 04/24/2017 1136   HGBUR NEGATIVE 04/24/2017 1136   BILIRUBINUR NEGATIVE  04/24/2017 1136   KETONESUR NEGATIVE 04/24/2017 1136   PROTEINUR 100 (A) 04/24/2017 1136   NITRITE NEGATIVE 04/24/2017 1136   LEUKOCYTESUR NEGATIVE 04/24/2017 1136     RADIOLOGY: Dg Chest Portable 1 View  Result Date: 06/21/2017 CLINICAL DATA:  Failed graft in left arm EXAM: PORTABLE CHEST 1 VIEW COMPARISON:  11/23/2016 FINDINGS: Left-sided pacing device as before. No acute pulmonary infiltrate or effusion. Normal cardiomediastinal silhouette with atherosclerosis. No pneumothorax. IMPRESSION: No active disease. Electronically Signed   By: Jasmine PangKim  Fujinaga M.D.   On: 06/21/2017 19:01    EKG: Orders placed or performed during the hospital encounter of 06/21/17  . EKG 12-Lead  . EKG 12-Lead    IMPRESSION AND PLAN: 1 acute dysfunctional left arm AV fistula Admit to regular nursing floor bed, consult vascular surgery for evaluation/treatment-was told that it needed to be declotted, consult IR for temporary hemodialysis catheter placement  2 acute hyperkalemia Secondary to end-stage renal disease Treated with calcium gluconate, IV insulin, Kayexalate, bicarb in the emergency room Check BMP in the morning  3 chronic end-stage renal disease Hemodialysis dependent Consult nephrology for hemodialysis needs and all other plans as stated above  4 history of peptic ulcer disease Stable PPI daily  Full code Condition guarded Prognosis fair DVT prophylaxis with heparin subcu Disposition home in 2-3 days barring any complications  All the records are reviewed and case discussed with ED provider. Management plans discussed with the patient, family and they are in agreement.  CODE STATUS: Code Status History    Date Active Date Inactive Code Status Order ID Comments User Context   06/01/2017 13:07 06/03/2017 20:19 Full Code [id number]  Katha HammingKonidena, Snehalatha, MD Inpatient   05/31/2016 21:01 06/07/2016 18:40 Full Code [id number]  Altamese DillingVachhani, Vaibhavkumar, MD Inpatient       TOTAL TIME  TAKING CARE OF THIS PATIENT: 40 minutes.    Evelena AsaMontell D Salary M.D on 06/21/2017   Between 7am to 6pm - Pager - [phone]  After 6pm go to [email or web address] - password [email or web address]  [facility]  Office  [phone]  CC: Primary care physician; Corky DownsMasoud, Javed, MD   Note: This dictation was prepared with Dragon dictation along with smaller phrase technology. Any transcriptional errors that result from this process are unintentional.

## 2017-06-22 ENCOUNTER — Encounter: Payer: Self-pay | Admitting: *Deleted

## 2017-06-22 ENCOUNTER — Other Ambulatory Visit: Payer: Self-pay

## 2017-06-22 ENCOUNTER — Encounter
Admission: EM | Disposition: A | Discharge status: Home / Self Care | Payer: Self-pay | Source: Home / Self Care | Attending: Internal Medicine

## 2017-06-22 DIAGNOSIS — N186 End stage renal disease: Secondary | ICD-10-CM | POA: Diagnosis not present

## 2017-06-22 HISTORY — PX: DIALYSIS/PERMA CATHETER INSERTION: CATH118288

## 2017-06-22 LAB — BASIC METABOLIC PANEL
Anion gap: 19 — ABNORMAL HIGH (ref 5–15)
BUN: 89 mg/dL — ABNORMAL HIGH (ref 6–20)
CHLORIDE: 98 mmol/L — AB (ref 101–111)
CO2: 21 mmol/L — AB (ref 22–32)
CREATININE: 18.62 mg/dL — AB (ref 0.44–1.00)
Calcium: 6.2 mg/dL — CL (ref 8.9–10.3)
GFR calc non Af Amer: 2 mL/min — ABNORMAL LOW (ref >60)
GFR, EST AFRICAN AMERICAN: 2 mL/min — AB (ref >60)
Glucose, Bld: 82 mg/dL (ref 65–99)
POTASSIUM: 4.4 mmol/L (ref 3.5–5.1)
Sodium: 138 mmol/L (ref 135–145)

## 2017-06-22 SURGERY — DIALYSIS/PERMA CATHETER INSERTION
Anesthesia: LOCAL

## 2017-06-22 MED ORDER — HEPARIN SODIUM (PORCINE) 10000 UNIT/ML IJ SOLN
INTRAMUSCULAR | Status: AC
  Filled 2017-06-22: qty 1

## 2017-06-22 MED ORDER — LIDOCAINE-EPINEPHRINE (PF) 1 %-1:200000 IJ SOLN
INTRAMUSCULAR | Status: AC
  Filled 2017-06-22: qty 30

## 2017-06-22 MED ORDER — MORPHINE SULFATE (PF) 2 MG/ML IV SOLN
2.0000 mg | INTRAVENOUS | Status: DC | PRN
  Administered 2017-06-22 – 2017-06-23 (×2): 2 mg via INTRAVENOUS
  Filled 2017-06-22 (×2): qty 1

## 2017-06-22 MED ORDER — FENTANYL CITRATE (PF) 100 MCG/2ML IJ SOLN
INTRAMUSCULAR | Status: DC | PRN
  Administered 2017-06-22: 50 ug via INTRAVENOUS
  Administered 2017-06-22: 25 ug via INTRAVENOUS

## 2017-06-22 MED ORDER — HEPARIN (PORCINE) IN NACL 2-0.9 UNIT/ML-% IJ SOLN
INTRAMUSCULAR | Status: AC
  Filled 2017-06-22: qty 500

## 2017-06-22 MED ORDER — MIDAZOLAM HCL 5 MG/5ML IJ SOLN
INTRAMUSCULAR | Status: AC
  Filled 2017-06-22: qty 5

## 2017-06-22 MED ORDER — SODIUM CHLORIDE 0.9 % IV SOLN
INTRAVENOUS | Status: DC
  Administered 2017-06-22: 10:00:00 via INTRAVENOUS

## 2017-06-22 MED ORDER — CEFAZOLIN SODIUM-DEXTROSE 1-4 GM/50ML-% IV SOLN
1.0000 g | Freq: Once | INTRAVENOUS | Status: AC
  Administered 2017-06-22: 1 g via INTRAVENOUS

## 2017-06-22 MED ORDER — FENTANYL CITRATE (PF) 100 MCG/2ML IJ SOLN
INTRAMUSCULAR | Status: AC
  Filled 2017-06-22: qty 2

## 2017-06-22 MED ORDER — MIDAZOLAM HCL 2 MG/2ML IJ SOLN
INTRAMUSCULAR | Status: DC | PRN
  Administered 2017-06-22: 2 mg via INTRAVENOUS
  Administered 2017-06-22: 1 mg via INTRAVENOUS

## 2017-06-22 SURGICAL SUPPLY — 8 items
CANNULA 5F STIFF (CANNULA) ×2 IMPLANT
CATH PALINDROME RT-P 15FX19CM (CATHETERS) ×2 IMPLANT
DERMABOND ADVANCED (GAUZE/BANDAGES/DRESSINGS) ×1
DERMABOND ADVANCED .7 DNX12 (GAUZE/BANDAGES/DRESSINGS) ×1 IMPLANT
PACK ANGIOGRAPHY (CUSTOM PROCEDURE TRAY) ×2 IMPLANT
SUT MNCRL AB 4-0 PS2 18 (SUTURE) ×2 IMPLANT
SUT PROLENE 0 CT 1 30 (SUTURE) ×2 IMPLANT
SUTURE VIC 3-0 (SUTURE) ×2 IMPLANT

## 2017-06-22 NOTE — H&P (Signed)
[facility] Vascular Consult Note  MRN : [id number]  Melissa Bradshaw is a 66 y.o. (1950-07-11) female who presents with chief complaint of  Chief Complaint  Patient presents with  . Evaluation - graft failed  .  History of Present Illness: Patient is admitted to the hospital last night with a failed AV graft.  She had hyperkalemia which has improved with treatment.  She was scheduled to have treated with declot 3 days ago but she was noncompliant and could not have it done.  She then did not follow-up for subsequent declot as advised.  She presented to the emergency department last night with a failed graft.  No new complaints today.  Current Facility-Administered Medications  Medication Dose Route Frequency Provider Last Rate Last Dose  . [MAR Hold] 0.9 %  sodium chloride infusion  250 mL Intravenous PRN Salary, Montell D, MD      . 0.9 %  sodium chloride infusion   Intravenous Continuous Sherry Blackard, Marlow Baars, MD      . Mitzi Hansen Hold] acetaminophen (TYLENOL) tablet 650 mg  650 mg Oral Q6H PRN Salary, Evelena Asa, MD       Or  . Mitzi Hansen Hold] acetaminophen (TYLENOL) suppository 650 mg  650 mg Rectal Q6H PRN Salary, Evelena Asa, MD      . Mitzi Hansen Hold] calcium acetate (PHOSLO) capsule 2,001 mg  2,001 mg Oral TID WC Salary, Montell D, MD      . Mitzi Hansen Hold] docusate sodium (COLACE) capsule 100 mg  100 mg Oral QODAY Salary, Montell D, MD      . Mitzi Hansen Hold] furosemide (LASIX) tablet 20 mg  20 mg Oral Daily Salary, Montell D, MD      . Mitzi Hansen Hold] heparin injection 5,000 Units  5,000 Units Subcutaneous Q8H Salary, Montell D, MD      . Mitzi Hansen Hold] HYDROcodone-acetaminophen (NORCO/VICODIN) 5-325 MG per tablet 1-2 tablet  1-2 tablet Oral Q4H PRN Salary, Evelena Asa, MD      . Mitzi Hansen Hold] insulin aspart (novoLOG) injection 10 Units  10 Units Intravenous Once Sharman Cheek, MD      . Mitzi Hansen Hold] latanoprost (XALATAN) 0.005 % ophthalmic solution 1 drop  1 drop Both Eyes QHS Salary, Montell D, MD      .  Mitzi Hansen Hold] ondansetron (ZOFRAN) tablet 4 mg  4 mg Oral Q6H PRN Salary, Montell D, MD       Or  . Mitzi Hansen Hold] ondansetron (ZOFRAN) injection 4 mg  4 mg Intravenous Q6H PRN Salary, Montell D, MD      . Mitzi Hansen Hold] pantoprazole (PROTONIX) EC tablet 40 mg  40 mg Oral Daily Salary, Montell D, MD   40 mg at 06/21/17 2201  . [MAR Hold] sodium chloride flush (NS) 0.9 % injection 3 mL  3 mL Intravenous Q12H Salary, Montell D, MD      . Mitzi Hansen Hold] sodium chloride flush (NS) 0.9 % injection 3 mL  3 mL Intravenous PRN Salary, Evelena Asa, MD        Past Medical History:  Diagnosis Date  . Anemia   . Aneurysm (HCC)    Cerebral, [place]  . Arthritis   . Cardiac pacemaker   . CHF (congestive heart failure) (HCC)   . Chronic kidney disease    ESRD  . Constipation   . Coronary artery disease   . Dialysis patient (HCC)    Tues, Thurs, Sat  . Gastric ulcer   . Glaucoma (increased  eye pressure)   . Headache   . Hypertension   . Presence of permanent cardiac pacemaker     Past Surgical History:  Procedure Laterality Date  . ANEURYSM COILING    . AV FISTULA PLACEMENT Left 07/20/2016   Procedure: ARTERIOVENOUS (AV) FISTULA CREATION;  Surgeon: Renford DillsGregory G Schnier, MD;  Location: [facility] ORS;  Service: Vascular;  Laterality: Left;  . AV FISTULA PLACEMENT Left 08/26/2016   Procedure: INSERTION OF ARTERIOVENOUS (AV) GORE-TEX GRAFT ARM;  Surgeon: Renford DillsGregory G Schnier, MD;  Location: [facility] ORS;  Service: Vascular;  Laterality: Left;  . COLONOSCOPY     2013  . DIALYSIS/PERMA CATHETER REMOVAL N/A 10/11/2016   Procedure: Dialysis/Perma Catheter Removal;  Surgeon: Renford DillsGregory G Schnier, MD;  Location: [facility] INVASIVE CV LAB;  Service: Cardiovascular;  Laterality: N/A;  . INSERT / REPLACE / REMOVE PACEMAKER    . IR RADIOLOGIST EVAL & MGMT  05/18/2017  . PACEMAKER PLACEMENT    . PERIPHERAL VASCULAR CATHETERIZATION N/A 06/02/2016   Procedure: Dialysis/Perma Catheter Insertion;  Surgeon: Annice NeedyJason S Selin Eisler, MD;   Location: [facility] INVASIVE CV LAB;  Service: Cardiovascular;  Laterality: N/A;    Social History Social History   Tobacco Use  . Smoking status: Current Every Day Smoker    Packs/day: 0.25    Years: 28.00    Pack years: 7.00    Types: Cigarettes  . Smokeless tobacco: Never Used  . Tobacco comment: pt lives with people who smoke in house  Substance Use Topics  . Alcohol use: No  . Drug use: No    Family History Family History  Problem Relation Age of Onset  . Hypertension Mother   . Diabetes Father   . Cancer Sister   . Stroke Sister   . Hypertension Sister     Allergies  Allergen Reactions  . Hydralazine Hcl Other (See Comments)    Makes pt jittery, nervous, messes with vision   . Iodine Itching and Rash    Unknown reaction  . Ivp Dye [Iodinated Diagnostic Agents] Rash     REVIEW OF SYSTEMS (Negative unless checked)  Constitutional: [] Weight loss  [] Fever  [] Chills Cardiac: [] Chest pain   [] Chest pressure   [] Palpitations   [] Shortness of breath when laying flat   [] Shortness of breath at rest   [x] Shortness of breath with exertion. Vascular:  [] Pain in legs with walking   [] Pain in legs at rest   [] Pain in legs when laying flat   [] Claudication   [] Pain in feet when walking  [] Pain in feet at rest  [] Pain in feet when laying flat   [] History of DVT   [] Phlebitis   [] Swelling in legs   [] Varicose veins   [] Non-healing ulcers Pulmonary:   [] Uses home oxygen   [] Productive cough   [] Hemoptysis   [] Wheeze  [] COPD   [] Asthma Neurologic:  [] Dizziness  [] Blackouts   [] Seizures   [] History of stroke   [] History of TIA  [] Aphasia   [] Temporary blindness   [] Dysphagia   [] Weakness or numbness in arms   [] Weakness or numbness in legs Musculoskeletal:  [x] Arthritis   [] Joint swelling   [] Joint pain   [] Low back pain Hematologic:  [] Easy bruising  [] Easy bleeding   [] Hypercoagulable state   [x] Anemic  [] Hepatitis Gastrointestinal:  [] Blood in stool   [] Vomiting blood   [] Gastroesophageal reflux/heartburn   [] Difficulty swallowing. Genitourinary:  [x] Chronic kidney disease   [] Difficult urination  [] Frequent urination  [] Burning with urination   [] Blood in urine Skin:  [] Rashes   []   Ulcers   [] Wounds Psychological:  [] History of anxiety   []  History of major depression.  Physical Examination  Vitals:   06/21/17 1930 06/21/17 2130 06/21/17 2235 06/22/17 0353  BP: 114/74 108/66 (!) 129/99 (!) 101/59  Pulse: 66 71 80 60  Resp: 18 12 20 18   Temp:   (!) 97.4 F (36.3 C) 98.1 F (36.7 C)  TempSrc:   Oral Oral  SpO2: 98% 99% 100% 99%  Weight:   55.5 kg (122 lb 5.7 oz)   Height:   5\' 6"  (1.676 m)    Body mass index is 19.75 kg/m. Gen:  WD/WN, NAD Head: [place]/AT, No temporalis wasting. Ear/Nose/Throat: Hearing grossly intact, nares w/o erythema or drainage, oropharynx w/o Erythema/Exudate Eyes: Sclera non-icteric, conjunctiva clear Neck: Trachea midline.  No JVD.  Pulmonary:  Good air movement, respirations not labored, equal bilaterally.  Cardiac: Irregular Vascular: no thrill in AVG Vessel Right Left  Radial Palpable Palpable                                   Gastrointestinal: soft, non-tender/non-distended. No guarding/reflex.  Musculoskeletal: M/S 5/5 throughout.  Extremities without ischemic changes.  No deformity or atrophy. No edema. Neurologic: Sensation grossly intact in extremities.  Symmetrical.  Speech is fluent. Motor exam as listed above. Psychiatric: Judgment intact, Mood & affect appropriate for pt's clinical situation. Dermatologic: No rashes or ulcers noted.  No cellulitis or open wounds.       CBC Lab Results  Component Value Date   WBC 5.4 06/21/2017   HGB 13.4 06/21/2017   HCT 40.4 06/21/2017   MCV 87.6 06/21/2017   PLT 386 06/21/2017    BMET    Component Value Date/Time   NA 138 06/22/2017 0426   NA 141 01/30/2014 1505   K 4.4 06/22/2017 0426   K 5.0 01/30/2014 1505   CL 98 (L) 06/22/2017 0426   CL 116  (H) 01/30/2014 1505   CO2 21 (L) 06/22/2017 0426   CO2 17 (L) 01/30/2014 1505   GLUCOSE 82 06/22/2017 0426   GLUCOSE 93 01/30/2014 1505   BUN 89 (H) 06/22/2017 0426   BUN 50 (H) 06/19/2014 1413   CREATININE 18.62 (H) 06/22/2017 0426   CREATININE 3.33 (H) 06/19/2014 1413   CALCIUM 6.2 (LL) 06/22/2017 0426   CALCIUM 8.8 01/30/2014 1505   GFRNONAA 2 (L) 06/22/2017 0426   GFRNONAA 15 (L) 06/19/2014 1413   GFRNONAA 14 (L) 01/30/2014 1505   GFRAA 2 (L) 06/22/2017 0426   GFRAA 18 (L) 06/19/2014 1413   GFRAA 16 (L) 01/30/2014 1505   Estimated Creatinine Clearance: 2.6 mL/min (A) (by C-G formula based on SCr of 18.62 mg/dL (H)).  COAG Lab Results  Component Value Date   INR 1.02 06/21/2017   INR 0.95 06/14/2017   INR 1.11 07/13/2016    Radiology Ct Abdomen Pelvis Wo Contrast  Result Date: 06/01/2017 CLINICAL DATA:  Diarrhea with abdominal pain and nausea EXAM: CT ABDOMEN AND PELVIS WITHOUT CONTRAST TECHNIQUE: Multidetector CT imaging of the abdomen and pelvis was performed following the standard protocol without IV contrast. COMPARISON:  06/05/2016, 06/06/2015 FINDINGS: Lower chest: Lung bases demonstrate no acute consolidation or pleural effusion. Partially visualized cardiac pacing leads. Trace pericardial effusion. Hepatobiliary: Innumerable cysts throughout the liver parenchyma. Liver is enlarged. Some of the cysts demonstrate increased density. For example 9 cm anterior cyst, series 2, image number 41. Left lobe cyst measuring 4.9 cm.  Gallbladder poorly visualized due to the numerous cysts and ascites in the abdomen. No biliary dilatation. Pancreas: Unremarkable. No pancreatic ductal dilatation or surrounding inflammatory changes. Spleen: Normal in size without focal abnormality. Adrenals/Urinary Tract: Difficult visualization of right adrenal gland. Left adrenal gland appears within normal limits. Enlarged polycystic kidneys bilaterally with scattered calcifications and areas of  increased density as before. The bladder is unremarkable. Stomach/Bowel: The stomach is nonenlarged. No dilated small bowel. No colon wall thickening. Normal appendix. Vascular/Lymphatic: Extensive atherosclerotic vascular disease. No significantly enlarged lymph nodes Reproductive: Uterus and bilateral adnexa are unremarkable. Other: Moderate to large volume of ascites in the abdomen and pelvis. No free air. Fluid in the umbilical region. Musculoskeletal: Degenerative changes. No acute or suspicious lesion IMPRESSION: 1. Negative for bowel obstruction or bowel wall thickening. Normal appendix. 2. Polycystic kidneys as before. Polycystic liver. Several of the liver cysts demonstrate increased internal density suggesting hemorrhage or proteinaceous debris. 3. Moderate to large volume of ascites. Electronically Signed   By: Jasmine Pang M.D.   On: 06/01/2017 22:19   US Abdomen Limited  Result Date: 06/02/2017 CLINICAL DATA:  Recurrent ascites. Paracentesis on 05/30/2017. Nausea and pain. EXAM: LIMITED ABDOMEN ULTRASOUND FOR ASCITES TECHNIQUE: Limited ultrasound survey for ascites was performed in all four abdominal quadrants. COMPARISON:  06/01/2017 and 05/30/2017 FINDINGS: Small amount of ascites in the right upper quadrant and right lower quadrant. No significant ascites in the left upper quadrant or left lower quadrant. Again noted are multiple hepatic cysts. IMPRESSION: Small amount of ascites in the right abdomen. Paracentesis was not performed due to the small volume. Electronically Signed   By: Richarda Overlie M.D.   On: 06/02/2017 15:19   US Paracentesis  Result Date: 06/19/2017 INDICATION: 66 year old female with autosomal dominant polycystic kidney disease and recurrent large volume ascites. She presents for paracentesis. EXAM: ULTRASOUND GUIDED  PARACENTESIS MEDICATIONS: None. COMPLICATIONS: None immediate. PROCEDURE: Informed written consent was obtained from the patient after a discussion of the  risks, benefits and alternatives to treatment. A timeout was performed prior to the initiation of the procedure. Initial ultrasound scanning demonstrates a large amount of ascites within the right lower abdominal quadrant. The right lower abdomen was prepped and draped in the usual sterile fashion. 1% lidocaine with epinephrine was used for local anesthesia. Following this, a 6 Fr Safe-T-Centesis catheter was introduced. An ultrasound image was saved for documentation purposes. The paracentesis was performed. The catheter was removed and a dressing was applied. The patient tolerated the procedure well without immediate post procedural complication. FINDINGS: A total of approximately 3900 mL of clear yellow ascitic fluid was removed. Samples were sent to the laboratory as requested by the clinical team. IMPRESSION: Successful ultrasound-guided paracentesis yielding 3.9 liters of peritoneal fluid. Electronically Signed   By: Malachy Moan M.D.   On: 06/19/2017 12:42   US Paracentesis  Result Date: 06/14/2017 CLINICAL DATA:  Chronic renal disease, recurrent abdominal ascites EXAM: ULTRASOUND GUIDED PARACENTESIS TECHNIQUE: The procedure, risks (including but not limited to bleeding, infection, organ damage ), benefits, and alternatives were explained to the patient. Questions regarding the procedure were encouraged and answered. The patient understands and consents to the procedure. Survey ultrasound of the abdomen was performed and an appropriate skin entry site in the right lower abdomen was selected. Skin site was marked, prepped with chlorhexadine, draped in usual sterile fashion, and infiltrated locally with 1% lidocaine. A Safe-T-Centesis needle was advanced into the peritoneal space until fluid could be aspirated. The sheath was advanced  and the needle removed. 5.8 L of clear yellow ascites were aspirated. The patient tolerated the procedure well. COMPLICATIONS: COMPLICATIONS none IMPRESSION: Technically  successful ultrasound guided paracentesis, removing 5.8 L ascites. Electronically Signed   By: Corlis Leak M.D.   On: 06/14/2017 16:13   US Paracentesis  Result Date: 05/30/2017 INDICATION: End stage renal disease on hemodialysis. Congestive heart failure. Recurrent abdominal ascites. Request for therapeutic paracentesis. EXAM: ULTRASOUND GUIDED LEFT LATERAL ABDOMEN PARACENTESIS MEDICATIONS: 1% Lidocaine = 15 mL COMPLICATIONS: None immediate. PROCEDURE: Informed written consent was obtained from the patient after a discussion of the risks, benefits and alternatives to treatment. A timeout was performed prior to the initiation of the procedure. Initial ultrasound scanning demonstrates a large amount of ascites within the left lateral abdomen. The left lateral abdomen was prepped and draped in the usual sterile fashion. 1% lidocaine with epinephrine was used for local anesthesia. Following this, a 19 gauge, 10-cm, Yueh catheter was introduced. An ultrasound image was saved for documentation purposes. The paracentesis was performed. The catheter was removed and a dressing was applied. The patient tolerated the procedure well without immediate post procedural complication. FINDINGS: A total of approximately 6 liters of clear yellow fluid was removed. IMPRESSION: Successful ultrasound-guided paracentesis yielding 6 liters of peritoneal fluid. Read by:  Corrin Parker, PA-C Electronically Signed   By: Gilmer Mor D.O.   On: 05/30/2017 11:31   US Paracentesis  Result Date: 05/23/2017 INDICATION: Abdominal distention secondary to recurrent ascites. Request therapeutic paracentesis up to 6 L max. EXAM: ULTRASOUND GUIDED RIGHT LOWER QUADRANT PARACENTESIS MEDICATIONS: None. COMPLICATIONS: None immediate. PROCEDURE: Informed written consent was obtained from the patient after a discussion of the risks, benefits and alternatives to treatment. A timeout was performed prior to the initiation of the procedure. Initial  ultrasound scanning demonstrates a large amount of ascites within the right lower abdominal quadrant. The right lower abdomen was prepped and draped in the usual sterile fashion. 1% lidocaine with epinephrine was used for local anesthesia. Following this, a Safe-T-Centesis catheter was introduced. An ultrasound image was saved for documentation purposes. The paracentesis was performed. The catheter was removed and a dressing was applied. The patient tolerated the procedure well without immediate post procedural complication. FINDINGS: A total of approximately 6 L of clear yellow fluid was removed. IMPRESSION: Successful ultrasound-guided paracentesis yielding 6 liters of peritoneal fluid. Read by: Brayton El PA-C Electronically Signed   By: Richarda Overlie M.D.   On: 05/23/2017 11:10   Dg Chest Portable 1 View  Result Date: 06/21/2017 CLINICAL DATA:  Failed graft in left arm EXAM: PORTABLE CHEST 1 VIEW COMPARISON:  11/23/2016 FINDINGS: Left-sided pacing device as before. No acute pulmonary infiltrate or effusion. Normal cardiomediastinal silhouette with atherosclerosis. No pneumothorax. IMPRESSION: No active disease. Electronically Signed   By: Jasmine Pang M.D.   On: 06/21/2017 19:01      Assessment/Plan 1.  End-stage renal disease with failed dialysis access.  Admitted last night for hyperkalemia and needs to have a catheter for dialysis.  In discussions with the nephrologist, we will place a PermCath at this time.  Consideration for trying to salvage the graft going forward can be made, but given her compliance issues and overall poor functional status a PermCath is the wisest choice at this point. 2.  Hyperkalemia.  Improved some with treatment but still would not perform a thrombectomy today 3.  Ascites.  Status post paracentesis recently.  Recurring problem 4.  Hypertension.  Stable on outpatient medications and blood  pressure control important in reducing the progression of atherosclerotic  disease. On appropriate oral medications.    Festus Barren, MD  06/22/2017 9:31 AM    This note was created with Dragon medical transcription system.  Any error is purely unintentional

## 2017-06-22 NOTE — Care Management (Signed)
Melissa Bradshaw HD liaison notified.  

## 2017-06-22 NOTE — Progress Notes (Signed)
Pt CVC not running at prescribed bfr,Pt complains of pain and soreness at site MD notified agrees to end tx and consult surgery to check cvc placement int he am.

## 2017-06-22 NOTE — Progress Notes (Signed)
[name]  ROUNDING NOTE   Subjective:   Readmitted for hyperkalemia. Last hemodialysis was 12/12. Complicated by clotted AVG. Potassium 7.1. Given kayexalate and now potassium down to 4.4  Permcath to be placed by vascular today.   Objective:  Vital signs in last 24 hours:  Temp:  [97.4 F (36.3 C)-98.5 F (36.9 C)] 98.5 F (36.9 C) (12/20 0927) Pulse Rate:  [60-80] 62 (12/20 0927) Resp:  [12-20] 16 (12/20 0927) BP: (101-129)/(59-99) 124/77 (12/20 0927) SpO2:  [97 %-100 %] 97 % (12/20 0927) Weight:  [54.4 kg (120 lb)-55.5 kg (122 lb 5.7 oz)] 55.3 kg (122 lb) (12/20 0927)  Weight change:  Filed Weights   06/21/17 1512 06/21/17 2235 06/22/17 0927  Weight: 54.4 kg (120 lb) 55.5 kg (122 lb 5.7 oz) 55.3 kg (122 lb)    Intake/Output: No intake/output data recorded.   Intake/Output this shift:  No intake/output data recorded.  Physical Exam: General: NAD, laying in bed  Head: Normocephalic, atraumatic. Moist oral mucosal membranes  Eyes: Anicteric, PERRL  Neck: Supple, trachea midline  Lungs:  Clear to auscultation  Heart: Regular rate and rhythm  Abdomen:  Soft, +umbilical hernia  Extremities:  trace peripheral edema.  Neurologic: Nonfocal, moving all four extremities  Skin: No lesions  Access: Left AVG no bruit or thrill    Basic Metabolic Panel: Recent Labs  Lab 06/21/17 1854 06/22/17 0426  NA 134* 138  K 7.1* 4.4  CL 99* 98*  CO2 21* 21*  GLUCOSE 95 82  BUN 88* 89*  CREATININE 18.48* 18.62*  CALCIUM 6.1* 6.2*    Liver Function Tests: No results for input(s): AST, ALT, ALKPHOS, BILITOT, PROT, ALBUMIN in the last 168 hours. No results for input(s): LIPASE, AMYLASE in the last 168 hours. No results for input(s): AMMONIA in the last 168 hours.  CBC: Recent Labs  Lab 06/21/17 1854  WBC 5.4  NEUTROABS 3.3  HGB 13.4  HCT 40.4  MCV 87.6  PLT 386    Cardiac Enzymes: No results for input(s): CKTOTAL, CKMB, CKMBINDEX, TROPONINI in the  last 168 hours.  BNP: Invalid input(s): POCBNP  CBG: No results for input(s): GLUCAP in the last 168 hours.  Microbiology: Results for orders placed or performed during the hospital encounter of 06/01/17  MRSA PCR Screening     Status: None   Collection Time: 06/01/17 12:24 PM  Result Value Ref Range Status   MRSA by PCR NEGATIVE NEGATIVE Final    Comment:        The GeneXpert MRSA Assay (FDA approved for NASAL specimens only), is one component of a comprehensive MRSA colonization surveillance program. It is not intended to diagnose MRSA infection nor to guide or monitor treatment for MRSA infections.   C difficile quick scan w PCR reflex     Status: Abnormal   Collection Time: 06/01/17 12:51 PM  Result Value Ref Range Status   C Diff antigen POSITIVE (A) NEGATIVE Final   C Diff toxin NEGATIVE NEGATIVE Final   C Diff interpretation Results are indeterminate. See PCR results.  Final  Clostridium Difficile by PCR     Status: Abnormal   Collection Time: 06/01/17 12:51 PM  Result Value Ref Range Status   Toxigenic C. Difficile by PCR POSITIVE (A) NEGATIVE Final    Comment: Positive for toxigenic C. difficile with little to no toxin production. Only treat if clinical presentation suggests symptomatic illness.  Gastrointestinal Panel by PCR , Stool     Status: None  Collection Time: 06/02/17 12:51 PM  Result Value Ref Range Status   Campylobacter species NOT DETECTED NOT DETECTED Final   Plesimonas shigelloides NOT DETECTED NOT DETECTED Final   Salmonella species NOT DETECTED NOT DETECTED Final   Yersinia enterocolitica NOT DETECTED NOT DETECTED Final   Vibrio species NOT DETECTED NOT DETECTED Final   Vibrio cholerae NOT DETECTED NOT DETECTED Final   Enteroaggregative E coli (EAEC) NOT DETECTED NOT DETECTED Final   Enteropathogenic E coli (EPEC) NOT DETECTED NOT DETECTED Final   Enterotoxigenic E coli (ETEC) NOT DETECTED NOT DETECTED Final   Shiga like toxin producing E  coli (STEC) NOT DETECTED NOT DETECTED Final   Shigella/Enteroinvasive E coli (EIEC) NOT DETECTED NOT DETECTED Final   Cryptosporidium NOT DETECTED NOT DETECTED Final   Cyclospora cayetanensis NOT DETECTED NOT DETECTED Final   Entamoeba histolytica NOT DETECTED NOT DETECTED Final   Giardia lamblia NOT DETECTED NOT DETECTED Final   Adenovirus F40/41 NOT DETECTED NOT DETECTED Final   Astrovirus NOT DETECTED NOT DETECTED Final   Norovirus GI/GII NOT DETECTED NOT DETECTED Final   Rotavirus A NOT DETECTED NOT DETECTED Final   Sapovirus (I, II, IV, and V) NOT DETECTED NOT DETECTED Final    Coagulation Studies: Recent Labs    06/21/17 1854  LABPROT 13.3  INR 1.02    Urinalysis: No results for input(s): COLORURINE, LABSPEC, PHURINE, GLUCOSEU, HGBUR, BILIRUBINUR, KETONESUR, PROTEINUR, UROBILINOGEN, NITRITE, LEUKOCYTESUR in the last 72 hours.  Invalid input(s): APPERANCEUR    Imaging: Dg Chest Portable 1 View  Result Date: 06/21/2017 CLINICAL DATA:  Failed graft in left arm EXAM: PORTABLE CHEST 1 VIEW COMPARISON:  11/23/2016 FINDINGS: Left-sided pacing device as before. No acute pulmonary infiltrate or effusion. Normal cardiomediastinal silhouette with atherosclerosis. No pneumothorax. IMPRESSION: No active disease. Electronically Signed   By: Jasmine PangKim  Fujinaga M.D.   On: 06/21/2017 19:01     Medications:   . [MAR Hold] sodium chloride    . sodium chloride 20 mL/hr at 06/22/17 0948  .  ceFAZolin (ANCEF) IV     . [MAR Hold] calcium acetate  2,001 mg Oral TID WC  . [MAR Hold] docusate sodium  100 mg Oral QODAY  . [MAR Hold] furosemide  20 mg Oral Daily  . [MAR Hold] heparin  5,000 Units Subcutaneous Q8H  . [MAR Hold] insulin aspart  10 Units Intravenous Once  . [MAR Hold] latanoprost  1 drop Both Eyes QHS  . [MAR Hold] pantoprazole  40 mg Oral Daily  . [MAR Hold] sodium chloride flush  3 mL Intravenous Q12H   [MAR Hold] sodium chloride, [MAR Hold] acetaminophen **OR** [MAR Hold]  acetaminophen, [MAR Hold] HYDROcodone-acetaminophen, [MAR Hold] ondansetron **OR** [MAR Hold] ondansetron (ZOFRAN) IV, [MAR Hold] sodium chloride flush  Assessment/ Plan:  Ms. Melissa [facility] is a 66 y.o. black female with end stage renal disease on hemodialysis, anemia, hypertension, polycystic Bradshaw disease, history of pacemaker, history of TIA, vertebral artery aneurysm status post coiling  MWF [facility] [address] Left AVG EDW 54.5kg  1. End-stage renal disease: with hyperkalemia, metabolic acidosis and uremic on admission. With complication of dialysis device.  Resume hemodialysis once permcath placed Appreciate vascular input.   2. Hypertension: blood pressure low - history of difficult to control. Home regimen of amlodipine, furosemide, and labetolol. Recommend holding medications.   3. Anemia chronic Bradshaw disease:  Hemoglobin 13.4 - normal range.  - holding EPO  4. Secondary hyperparathyroidism: with hyperphosphatemia and hypocalcemia. Holding cinacalcet due to hypocalcemia - calcium acetate  with meals.     LOS: 1 Berthe Oley 12/20/201810:00 AM

## 2017-06-22 NOTE — Progress Notes (Signed)
HD started. 

## 2017-06-22 NOTE — Progress Notes (Signed)
RN received call from lab regarding critical Potassium level of 6.2.  Patient received  kayexalate last night prior to admission to floor.  RN informed Dr. Blenda BridegroomPyerddy, no actions at this time.

## 2017-06-22 NOTE — Discharge Instructions (Signed)
[facility] [place] [facility] ° °DIET:  °Renal diet ° °DISCHARGE CONDITION:  °Stable ° °ACTIVITY:  °Activity as tolerated ° °OXYGEN:  °Home Oxygen: No. °  °Oxygen Delivery: room air ° °DISCHARGE LOCATION:  °home  ° ° °ADDITIONAL DISCHARGE INSTRUCTION: ° ° °If you experience worsening of your admission symptoms, develop shortness of breath, life threatening emergency, suicidal or homicidal thoughts you must seek medical attention immediately by calling 911 or calling your MD immediately  if symptoms less severe. ° °You Must read complete instructions/literature along with all the possible adverse reactions/side effects for all the Medicines you take and that have been prescribed to you. Take any new Medicines after you have completely understood and accpet all the possible adverse reactions/side effects.  ° °Please note ° °You were cared for by a hospitalist during your hospital stay. If you have any questions about your discharge medications or the care you received while you were in the hospital after you are discharged, you can call the unit and asked to speak with the hospitalist on call if the hospitalist that took care of you is not available. Once you are discharged, your primary care physician will handle any further medical issues. Please note that NO REFILLS for any discharge medications will be authorized once you are discharged, as it is imperative that you return to your primary care physician (or establish a relationship with a primary care physician if you do not have one) for your aftercare needs so that they can reassess your need for medications and monitor your lab values. ° ° °

## 2017-06-22 NOTE — Progress Notes (Signed)
Patient verbalized not wanting to be disturbed once she was asleep.  RN reviewed medications that would be scheduled for the evening, patient reiterated not wanting to be disturbed and declined all PM medications.  Patient also wanted RN to convey that she was not in agreement and did not want to take insulin while here.  It is her preference that the MD consult with her primary MD for further discussion.  RN informed Pharmacist and on call MD.

## 2017-06-22 NOTE — Progress Notes (Signed)
HD TX Ended  

## 2017-06-22 NOTE — Progress Notes (Signed)
Pre HD assessment  

## 2017-06-22 NOTE — Op Note (Signed)
OPERATIVE NOTE    PRE-OPERATIVE DIAGNOSIS: 1. ESRD 2. Hyperkalemia treated with Kay-Exalate  POST-OPERATIVE DIAGNOSIS: same as above  PROCEDURE: 1. Ultrasound guidance for vascular access to the right internal jugular vein 2. Fluoroscopic guidance for placement of catheter 3. Placement of a 19 cm tip to cuff tunneled hemodialysis catheter via the right internal jugular vein  SURGEON: Festus BarrenJason Dezyrae Kensinger, MD  ANESTHESIA:  Local with Moderate conscious sedation for approximately 15 minutes using 3 mg of Versed and 75 mcg of Fentanyl  ESTIMATED BLOOD LOSS: 5 cc  FLUORO TIME: less than one minute  CONTRAST: none  FINDING(S): 1.  Patent right internal jugular vein  SPECIMEN(S):  None  INDICATIONS:   Melissa [facility] is a 66 y.o.female who presents with renal failure.  She was scheduled for declot earlier this week but was not compliant and presented to the ER last night with a K of over 7.  The patient needs long term dialysis access for their ESRD, and a Permcath is necessary.  Risks and benefits are discussed and informed consent is obtained.    DESCRIPTION: After obtaining full informed written consent, the patient was brought back to the vascular suited. The patient's right neck and chest were sterilely prepped and draped in a sterile surgical field was created. Moderate conscious sedation was administered during a face to face encounter with the patient throughout the procedure with my supervision of the RN administering medicines and monitoring the patient's vital signs, pulse oximetry, telemetry and mental status throughout from the start of the procedure until the patient was taken to the recovery room.  The right internal jugular vein was visualized with ultrasound and found to be patent. It was then accessed under direct ultrasound guidance and a permanent image was recorded. A wire was placed. After skin nick and dilatation, the peel-away sheath was placed over the wire. I then turned  my attention to an area under the clavicle. Approximately 1-2 fingerbreadths below the clavicle a small counterincision was created and tunneled from the subclavicular incision to the access site. Using fluoroscopic guidance, a 19 centimeter tip to cuff tunneled hemodialysis catheter was selected, and tunneled from the subclavicular incision to the access site. It was then placed through the peel-away sheath and the peel-away sheath was removed. Using fluoroscopic guidance the catheter tips were parked in the right atrium. The appropriate distal connectors were placed. It withdrew blood well and flushed easily with heparinized saline and a concentrated heparin solution was then placed. It was secured to the chest wall with 2 Prolene sutures. The access incision was closed single 4-0 Monocryl. A 4-0 Monocryl pursestring suture was placed around the exit site. Sterile dressings were placed. The patient tolerated the procedure well and was taken to the recovery room in stable condition.  COMPLICATIONS: None  CONDITION: Stable  Festus BarrenJason Jacquette Canales, MD 06/22/2017 10:28 AM   This note was created with Dragon Medical transcription system. Any errors in dictation are purely unintentional.

## 2017-06-22 NOTE — Progress Notes (Signed)
Patient declined Heparin shot

## 2017-06-22 NOTE — Progress Notes (Signed)
HD Post Assessment  

## 2017-06-22 NOTE — Progress Notes (Signed)
New set up initiated, for clotted system. MD notified and aware

## 2017-06-23 ENCOUNTER — Inpatient Hospital Stay
Admission: EM | Disposition: A | Discharge status: Home / Self Care | Payer: Self-pay | Source: Home / Self Care | Attending: Internal Medicine

## 2017-06-23 ENCOUNTER — Encounter
Admission: EM | Disposition: A | Discharge status: Home / Self Care | Payer: Self-pay | Source: Home / Self Care | Attending: Internal Medicine

## 2017-06-23 ENCOUNTER — Encounter: Payer: Self-pay | Admitting: *Deleted

## 2017-06-23 DIAGNOSIS — N186 End stage renal disease: Secondary | ICD-10-CM

## 2017-06-23 HISTORY — PX: DIALYSIS/PERMA CATHETER INSERTION: CATH118288

## 2017-06-23 SURGERY — DIALYSIS/PERMA CATHETER INSERTION
Anesthesia: Moderate Sedation

## 2017-06-23 MED ORDER — MIDAZOLAM HCL 2 MG/2ML IJ SOLN
INTRAMUSCULAR | Status: AC
  Filled 2017-06-23: qty 2

## 2017-06-23 MED ORDER — HEPARIN SODIUM (PORCINE) 10000 UNIT/ML IJ SOLN
INTRAMUSCULAR | Status: AC
  Filled 2017-06-23: qty 1

## 2017-06-23 MED ORDER — LIDOCAINE-EPINEPHRINE (PF) 1 %-1:200000 IJ SOLN
INTRAMUSCULAR | Status: DC | PRN
  Administered 2017-06-23: 12 mL via INTRADERMAL

## 2017-06-23 MED ORDER — FENTANYL CITRATE (PF) 100 MCG/2ML IJ SOLN
INTRAMUSCULAR | Status: AC
  Filled 2017-06-23: qty 2

## 2017-06-23 MED ORDER — DEXTROSE 5 % IV SOLN
1.5000 g | INTRAVENOUS | Status: AC
  Administered 2017-06-23: 1.5 g via INTRAVENOUS

## 2017-06-23 MED ORDER — SODIUM CHLORIDE 0.9 % IV SOLN: INTRAVENOUS | Status: DC

## 2017-06-23 MED ORDER — HEPARIN (PORCINE) IN NACL 2-0.9 UNIT/ML-% IJ SOLN
INTRAMUSCULAR | Status: AC
Start: 2017-06-23 — End: 2017-06-23
  Filled 2017-06-23: qty 500

## 2017-06-23 MED ORDER — MIDAZOLAM HCL 2 MG/2ML IJ SOLN
INTRAMUSCULAR | Status: DC | PRN
  Administered 2017-06-23: 1 mg via INTRAVENOUS

## 2017-06-23 MED ORDER — MIDAZOLAM HCL 2 MG/ML PO SYRP
ORAL_SOLUTION | ORAL | Status: AC
  Administered 2017-06-23: 8 mg via ORAL
  Filled 2017-06-23: qty 4

## 2017-06-23 MED ORDER — DIPHENHYDRAMINE HCL 50 MG/ML IJ SOLN
12.5000 mg | Freq: Four times a day (QID) | INTRAMUSCULAR | Status: DC | PRN
  Administered 2017-06-23 (×2): 12.5 mg via INTRAVENOUS
  Filled 2017-06-23 (×2): qty 1

## 2017-06-23 MED ORDER — FENTANYL CITRATE (PF) 100 MCG/2ML IJ SOLN
INTRAMUSCULAR | Status: DC | PRN
  Administered 2017-06-23: 25 ug via INTRAVENOUS

## 2017-06-23 MED ORDER — MIDAZOLAM HCL 2 MG/ML PO SYRP
8.0000 mg | ORAL_SOLUTION | Freq: Once | ORAL | Status: AC
  Administered 2017-06-23: 8 mg via ORAL

## 2017-06-23 MED ORDER — METHYLPREDNISOLONE SODIUM SUCC 125 MG IJ SOLR
125.0000 mg | Freq: Once | INTRAMUSCULAR | Status: AC
  Administered 2017-06-23: 125 mg via INTRAVENOUS

## 2017-06-23 MED ORDER — HEPARIN SODIUM (PORCINE) 1000 UNIT/ML IJ SOLN
INTRAMUSCULAR | Status: DC | PRN
  Administered 2017-06-23: 10000 [IU] via INTRAVENOUS

## 2017-06-23 MED ORDER — LIDOCAINE-EPINEPHRINE (PF) 1 %-1:200000 IJ SOLN
INTRAMUSCULAR | Status: AC
Start: 2017-06-23 — End: 2017-06-23
  Filled 2017-06-23: qty 30

## 2017-06-23 MED ORDER — DEXTROSE 5 % IV SOLN
INTRAVENOUS | Status: AC
  Filled 2017-06-23: qty 1.5

## 2017-06-23 MED ORDER — METHYLPREDNISOLONE SODIUM SUCC 125 MG IJ SOLR
INTRAMUSCULAR | Status: AC
  Administered 2017-06-23: 125 mg via INTRAVENOUS
  Filled 2017-06-23: qty 2

## 2017-06-23 SURGICAL SUPPLY — 4 items
CATH PALINDROME-P 19CM W/VT (CATHETERS) ×3 IMPLANT
GUIDEWIRE SUPER STIFF .035X180 (WIRE) ×3 IMPLANT
PACK ANGIOGRAPHY (CUSTOM PROCEDURE TRAY) ×3 IMPLANT
SUT MNCRL AB 4-0 PS2 18 (SUTURE) ×3 IMPLANT

## 2017-06-23 NOTE — Progress Notes (Signed)
Discharge order received post dialysis. Discharge summary AVS reviewed with the patient, and signed. IV removed. tolerated HD well. Aware of future appointment. Perm cath sight clean. Discharged via wheelchair to family member car.

## 2017-06-23 NOTE — Progress Notes (Signed)
HD tx start 

## 2017-06-23 NOTE — Progress Notes (Signed)
HD tx end  

## 2017-06-23 NOTE — Progress Notes (Signed)
Post HD assessment  

## 2017-06-23 NOTE — Progress Notes (Signed)
Pre HD assessment  

## 2017-06-23 NOTE — Care Management Important Message (Signed)
Important Message  Patient Details  Name: [facility] MRN: [id number] Date of Birth: 09/11/1950   Medicare Important Message Given:  Yes    Chapman FitchBOWEN, Terell Kincy T, RN 06/23/2017, 2:42 PM

## 2017-06-23 NOTE — Progress Notes (Signed)
[employer] [facility] [facility] [place] [facility]                                                                                                                                                                                  Patient Demographics   Melissa PortelaCaroline Wayson, is a 66 y.o. female, DOB - 06/16/1951, ZOX:[id number]  Admit date - 06/21/2017   Admitting Physician Bertrum SolMontell D Salary, MD  Outpatient Primary MD for the patient is Corky DownsMasoud, Javed, MD   LOS - 2  Subjective: Patient admitted for dialysis access and nonfunctioning AV fistula was noted to have hyperkalemia Very anxious to go home   Review of Systems:   CONSTITUTIONAL: No documented fever. No fatigue, weakness. No weight gain, no weight loss.  EYES: No blurry or double vision.  ENT: No tinnitus. No postnasal drip. No redness of the oropharynx.  RESPIRATORY: No cough, no wheeze, no hemoptysis. No dyspnea.  CARDIOVASCULAR: No chest pain. No orthopnea. No palpitations. No syncope.  GASTROINTESTINAL: No nausea, no vomiting or diarrhea. No abdominal pain. No melena or hematochezia.  GENITOURINARY: No dysuria or hematuria.  ENDOCRINE: No polyuria or nocturia. No heat or cold intolerance.  HEMATOLOGY: No anemia. No bruising. No bleeding.  INTEGUMENTARY: No rashes. No lesions.  MUSCULOSKELETAL: No arthritis. No swelling. No gout.  NEUROLOGIC: No numbness, tingling, or ataxia. No seizure-type activity.  PSYCHIATRIC: No anxiety. No insomnia. No ADD.    Vitals:   Vitals:   06/22/17 1711 06/22/17 1715 06/23/17 0610 06/23/17 1258  BP: 128/64 121/72 123/76 114/63  Pulse: 74 60 80 69  Resp: 17 18 18 16   Temp:  98 F (36.7 C) 98.5 F (36.9 C) (!) 97.2 F (36.2 C)  TempSrc:  Oral Oral Oral  SpO2:  97% 100% 100%  Weight:      Height:        Wt Readings from Last 3 Encounters:  06/22/17 122 lb (55.3 kg)  06/19/17 120 lb (54.4 kg)  06/14/17 120 lb (54.4 kg)     Intake/Output Summary (Last 24 hours) at 06/23/2017 1500 Last  data filed at 06/22/2017 1715 Gross per 24 hour  Intake -  Output -311 ml  Net 311 ml    Physical Exam:   GENERAL: Pleasant-appearing in no apparent distress.  HEAD, EYES, EARS, NOSE AND THROAT: Atraumatic, normocephalic. Extraocular muscles are intact. Pupils equal and reactive to light. Sclerae anicteric. No conjunctival injection. No oro-pharyngeal erythema.  NECK: Supple. There is no jugular venous distention. No bruits, no lymphadenopathy, no thyromegaly.  HEART: Regular rate and rhythm,. No murmurs, no rubs, no clicks.  LUNGS: Clear to auscultation bilaterally. No rales or rhonchi. No wheezes.  ABDOMEN: Soft,  flat, nontender, nondistended. Has good bowel sounds. No hepatosplenomegaly appreciated.  EXTREMITIES: No evidence of any cyanosis, clubbing, or peripheral edema.  +2 pedal and radial pulses bilaterally.  NEUROLOGIC: The patient is alert, awake, and oriented x3 with no focal motor or sensory deficits appreciated bilaterally.  SKIN: Moist and warm with no rashes appreciated.  Psych: Not anxious, depressed LN: No inguinal LN enlargement    Antibiotics   Anti-infectives (From admission, onward)   Start     Dose/Rate Route Frequency Ordered Stop   06/22/17 1000  ceFAZolin (ANCEF) IVPB 1 g/50 mL premix     1 g 100 mL/hr over 30 Minutes Intravenous  Once 06/22/17 0947 06/22/17 1024      Medications   Scheduled Meds: . calcium acetate  2,001 mg Oral TID WC  . docusate sodium  100 mg Oral QODAY  . furosemide  20 mg Oral Daily  . heparin  5,000 Units Subcutaneous Q8H  . insulin aspart  10 Units Intravenous Once  . latanoprost  1 drop Both Eyes QHS  . pantoprazole  40 mg Oral Daily  . sodium chloride flush  3 mL Intravenous Q12H   Continuous Infusions: . sodium chloride     PRN Meds:.sodium chloride, acetaminophen **OR** acetaminophen, diphenhydrAMINE, HYDROcodone-acetaminophen, morphine injection, ondansetron **OR** ondansetron (ZOFRAN) IV, sodium chloride  flush   Data Review:   Micro Results No results found for this or any previous visit (from the past 240 hour(s)).  Radiology Reports Ct Abdomen Pelvis Wo Contrast  Result Date: 06/01/2017 CLINICAL DATA:  Diarrhea with abdominal pain and nausea EXAM: CT ABDOMEN AND PELVIS WITHOUT CONTRAST TECHNIQUE: Multidetector CT imaging of the abdomen and pelvis was performed following the standard protocol without IV contrast. COMPARISON:  06/05/2016, 06/06/2015 FINDINGS: Lower chest: Lung bases demonstrate no acute consolidation or pleural effusion. Partially visualized cardiac pacing leads. Trace pericardial effusion. Hepatobiliary: Innumerable cysts throughout the liver parenchyma. Liver is enlarged. Some of the cysts demonstrate increased density. For example 9 cm anterior cyst, series 2, image number 41. Left lobe cyst measuring 4.9 cm. Gallbladder poorly visualized due to the numerous cysts and ascites in the abdomen. No biliary dilatation. Pancreas: Unremarkable. No pancreatic ductal dilatation or surrounding inflammatory changes. Spleen: Normal in size without focal abnormality. Adrenals/Urinary Tract: Difficult visualization of right adrenal gland. Left adrenal gland appears within normal limits. Enlarged polycystic kidneys bilaterally with scattered calcifications and areas of increased density as before. The bladder is unremarkable. Stomach/Bowel: The stomach is nonenlarged. No dilated small bowel. No colon wall thickening. Normal appendix. Vascular/Lymphatic: Extensive atherosclerotic vascular disease. No significantly enlarged lymph nodes Reproductive: Uterus and bilateral adnexa are unremarkable. Other: Moderate to large volume of ascites in the abdomen and pelvis. No free air. Fluid in the umbilical region. Musculoskeletal: Degenerative changes. No acute or suspicious lesion IMPRESSION: 1. Negative for bowel obstruction or bowel wall thickening. Normal appendix. 2. Polycystic kidneys as before.  Polycystic liver. Several of the liver cysts demonstrate increased internal density suggesting hemorrhage or proteinaceous debris. 3. Moderate to large volume of ascites. Electronically Signed   By: Jasmine Pang M.D.   On: 06/01/2017 22:19   US Abdomen Limited  Result Date: 06/02/2017 CLINICAL DATA:  Recurrent ascites. Paracentesis on 05/30/2017. Nausea and pain. EXAM: LIMITED ABDOMEN ULTRASOUND FOR ASCITES TECHNIQUE: Limited ultrasound survey for ascites was performed in all four abdominal quadrants. COMPARISON:  06/01/2017 and 05/30/2017 FINDINGS: Small amount of ascites in the right upper quadrant and right lower quadrant. No significant ascites in the left upper  quadrant or left lower quadrant. Again noted are multiple hepatic cysts. IMPRESSION: Small amount of ascites in the right abdomen. Paracentesis was not performed due to the small volume. Electronically Signed   By: Richarda OverlieAdam  Henn M.D.   On: 06/02/2017 15:19   [place] Paracentesis  Result Date: 06/19/2017 INDICATION: 66 year old female with autosomal dominant polycystic kidney disease and recurrent large volume ascites. She presents for paracentesis. EXAM: ULTRASOUND GUIDED  PARACENTESIS MEDICATIONS: None. COMPLICATIONS: None immediate. PROCEDURE: Informed written consent was obtained from the patient after a discussion of the risks, benefits and alternatives to treatment. A timeout was performed prior to the initiation of the procedure. Initial ultrasound scanning demonstrates a large amount of ascites within the right lower abdominal quadrant. The right lower abdomen was prepped and draped in the usual sterile fashion. 1% lidocaine with epinephrine was used for local anesthesia. Following this, a 6 Fr Safe-T-Centesis catheter was introduced. An ultrasound image was saved for documentation purposes. The paracentesis was performed. The catheter was removed and a dressing was applied. The patient tolerated the procedure well without immediate post  procedural complication. FINDINGS: A total of approximately 3900 mL of clear yellow ascitic fluid was removed. Samples were sent to the laboratory as requested by the clinical team. IMPRESSION: Successful ultrasound-guided paracentesis yielding 3.9 liters of peritoneal fluid. Electronically Signed   By: Malachy MoanHeath  McCullough M.D.   On: 06/19/2017 12:42   [place] Paracentesis  Result Date: 06/14/2017 CLINICAL DATA:  Chronic renal disease, recurrent abdominal ascites EXAM: ULTRASOUND GUIDED PARACENTESIS TECHNIQUE: The procedure, risks (including but not limited to bleeding, infection, organ damage ), benefits, and alternatives were explained to the patient. Questions regarding the procedure were encouraged and answered. The patient understands and consents to the procedure. Survey ultrasound of the abdomen was performed and an appropriate skin entry site in the right lower abdomen was selected. Skin site was marked, prepped with chlorhexadine, draped in usual sterile fashion, and infiltrated locally with 1% lidocaine. A Safe-T-Centesis needle was advanced into the peritoneal space until fluid could be aspirated. The sheath was advanced and the needle removed. 5.8 L of clear yellow ascites were aspirated. The patient tolerated the procedure well. COMPLICATIONS: COMPLICATIONS none IMPRESSION: Technically successful ultrasound guided paracentesis, removing 5.8 L ascites. Electronically Signed   By: Corlis Leak  Hassell M.D.   On: 06/14/2017 16:13   [place] Paracentesis  Result Date: 05/30/2017 INDICATION: End stage renal disease on hemodialysis. Congestive heart failure. Recurrent abdominal ascites. Request for therapeutic paracentesis. EXAM: ULTRASOUND GUIDED LEFT LATERAL ABDOMEN PARACENTESIS MEDICATIONS: 1% Lidocaine = 15 mL COMPLICATIONS: None immediate. PROCEDURE: Informed written consent was obtained from the patient after a discussion of the risks, benefits and alternatives to treatment. A timeout was performed prior to the  initiation of the procedure. Initial ultrasound scanning demonstrates a large amount of ascites within the left lateral abdomen. The left lateral abdomen was prepped and draped in the usual sterile fashion. 1% lidocaine with epinephrine was used for local anesthesia. Following this, a 19 gauge, 10-cm, Yueh catheter was introduced. An ultrasound image was saved for documentation purposes. The paracentesis was performed. The catheter was removed and a dressing was applied. The patient tolerated the procedure well without immediate post procedural complication. FINDINGS: A total of approximately 6 liters of clear yellow fluid was removed. IMPRESSION: Successful ultrasound-guided paracentesis yielding 6 liters of peritoneal fluid. Read by:  Corrin ParkerWendy Blair, PA-C Electronically Signed   By: Gilmer MorJaime  Wagner D.O.   On: 05/30/2017 11:31   Dg Chest Portable 1 View  Result Date: 06/21/2017 CLINICAL DATA:  Failed graft in left arm EXAM: PORTABLE CHEST 1 VIEW COMPARISON:  11/23/2016 FINDINGS: Left-sided pacing device as before. No acute pulmonary infiltrate or effusion. Normal cardiomediastinal silhouette with atherosclerosis. No pneumothorax. IMPRESSION: No active disease. Electronically Signed   By: Jasmine Pang M.D.   On: 06/21/2017 19:01     CBC Recent Labs  Lab 06/21/17 1854  WBC 5.4  HGB 13.4  HCT 40.4  PLT 386  MCV 87.6  MCH 29.1  MCHC 33.2  RDW 16.3*  LYMPHSABS 1.4  MONOABS 0.5  EOSABS 0.1  BASOSABS 0.1    Chemistries  Recent Labs  Lab 06/21/17 1854 06/22/17 0426  NA 134* 138  K 7.1* 4.4  CL 99* 98*  CO2 21* 21*  GLUCOSE 95 82  BUN 88* 89*  CREATININE 18.48* 18.62*  CALCIUM 6.1* 6.2*   ------------------------------------------------------------------------------------------------------------------ estimated creatinine clearance is 2.6 mL/min (A) (by C-G formula based on SCr of 18.62 mg/dL  (H)). ------------------------------------------------------------------------------------------------------------------ No results for input(s): HGBA1C in the last 72 hours. ------------------------------------------------------------------------------------------------------------------ No results for input(s): CHOL, HDL, LDLCALC, TRIG, CHOLHDL, LDLDIRECT in the last 72 hours. ------------------------------------------------------------------------------------------------------------------ No results for input(s): TSH, T4TOTAL, T3FREE, THYROIDAB in the last 72 hours.  Invalid input(s): FREET3 ------------------------------------------------------------------------------------------------------------------ No results for input(s): VITAMINB12, FOLATE, FERRITIN, TIBC, IRON, RETICCTPCT in the last 72 hours.  Coagulation profile Recent Labs  Lab 06/21/17 1854  INR 1.02    No results for input(s): DDIMER in the last 72 hours.  Cardiac Enzymes No results for input(s): CKMB, TROPONINI, MYOGLOBIN in the last 168 hours.  Invalid input(s): CK ------------------------------------------------------------------------------------------------------------------ Invalid input(s): POCBNP    Assessment & Plan    1. acute dysfunctional left arm AV fistula Patient had a hemodialysis access placed however he did not function so she will need to stay   2 acute hyperkalemia Secondary to end-stage renal disease Treated with calcium gluconate, IV insulin, Kayexalate, bicarb in the emergency room Status post treatment  3 chronic end-stage renal disease Hemodialysis dependent Consult nephrology for hemodialysis needs and all other plans as stated above  4 history of peptic ulcer disease Stable PPI daily  Full code Condition guarded Prognosis fair DVT prophylaxis with heparin subcu Disposition home in 2-3 days barring any complications       Code Status Orders  (From admission,  onward)        Start     Ordered   06/21/17 2232  Full code  Continuous     06/21/17 2231    Code Status History    Date Active Date Inactive Code Status Order ID Comments User Context   06/01/2017 13:07 06/03/2017 20:19 Full Code [id number]  Katha Hamming, MD Inpatient   05/31/2016 21:01 06/07/2016 18:40 Full Code [id number]  Altamese Dilling, MD Inpatient           Consults nephrology  DVT Prophylaxis heparin  Lab Results  Component Value Date   PLT 386 06/21/2017     Time Spent in minutes   35 minutes  Greater than 50% of time spent in care coordination and counseling patient regarding the condition and plan of care.   Auburn Bilberry M.D on 06/23/2017 at 3:00 PM  Between 7am to 6pm - Pager - [phone]  After 6pm go to [email or web address] - password [email or web address] [facility]  [facility]   Office  [phone]

## 2017-06-23 NOTE — Progress Notes (Signed)
[name]  R[facility]   Subjective:   Unable to get new RIJ tunneled catheter to run last night.   Patient quite upset about this.   Scheduled for dialysis today.   Objective:  Vital signs in last 24 hours:  Temp:  [97.2 F (36.2 C)-98.5 F (36.9 C)] 97.2 F (36.2 C) (12/21 1258) Pulse Rate:  [59-91] 69 (12/21 1258) Resp:  [16-18] 16 (12/21 1258) BP: (114-130)/(63-76) 114/63 (12/21 1258) SpO2:  [97 %-100 %] 100 % (12/21 1258)  Weight change: 0.907 kg (2 lb) Filed Weights   06/21/17 1512 06/21/17 2235 06/22/17 0927  Weight: 54.4 kg (120 lb) 55.5 kg (122 lb 5.7 oz) 55.3 kg (122 lb)    Intake/Output: I/O last 3 completed shifts: In: 480 [P.O.:480] Out: -311    Intake/Output this shift:  No intake/output data recorded.  Physical Exam: General: NAD, laying in bed  Head: Normocephalic, atraumatic. Moist oral mucosal membranes  Eyes: Anicteric, PERRL  Neck: Supple, trachea midline  Lungs:  Clear to auscultation  Heart: Regular rate and rhythm  Abdomen:  Soft, +umbilical hernia  Extremities:  trace peripheral edema.  Neurologic: Nonfocal, moving all four extremities  Skin: No lesions  Access: Left AVG no bruit or thrill, RIJ permcath 12/20 Melissa Bradshaw    Basic Metabolic Panel: Recent Labs  Lab 06/21/17 1854 06/22/17 0426  NA 134* 138  K 7.1* 4.4  CL 99* 98*  CO2 21* 21*  GLUCOSE 95 82  BUN 88* 89*  CREATININE 18.48* 18.62*  CALCIUM 6.1* 6.2*    Liver Function Tests: No results for input(s): AST, ALT, ALKPHOS, BILITOT, PROT, ALBUMIN in the last 168 hours. No results for input(s): LIPASE, AMYLASE in the last 168 hours. No results for input(s): AMMONIA in the last 168 hours.  CBC: Recent Labs  Lab 06/21/17 1854  WBC 5.4  NEUTROABS 3.3  HGB 13.4  HCT 40.4  MCV 87.6  PLT 386    Cardiac Enzymes: No results for input(s): CKTOTAL, CKMB, CKMBINDEX, TROPONINI in the last 168 hours.  BNP: Invalid input(s): POCBNP  CBG: No results  for input(s): GLUCAP in the last 168 hours.  Microbiology: Results for orders placed or performed during the hospital encounter of 06/01/17  MRSA PCR Screening     Status: None   Collection Time: 06/01/17 12:24 PM  Result Value Ref Range Status   MRSA by PCR NEGATIVE NEGATIVE Final    Comment:        The GeneXpert MRSA Assay (FDA approved for NASAL specimens only), is one component of a comprehensive MRSA colonization surveillance program. It is not intended to diagnose MRSA infection nor to guide or monitor treatment for MRSA infections.   C difficile quick scan w PCR reflex     Status: Abnormal   Collection Time: 06/01/17 12:51 PM  Result Value Ref Range Status   C Diff antigen POSITIVE (A) NEGATIVE Final   C Diff toxin NEGATIVE NEGATIVE Final   C Diff interpretation Results are indeterminate. See PCR results.  Final  Clostridium Difficile by PCR     Status: Abnormal   Collection Time: 06/01/17 12:51 PM  Result Value Ref Range Status   Toxigenic C. Difficile by PCR POSITIVE (A) NEGATIVE Final    Comment: Positive for toxigenic C. difficile with little to no toxin production. Only treat if clinical presentation suggests symptomatic illness.  Gastrointestinal Panel by PCR , Stool     Status: None   Collection Time: 06/02/17 12:51 PM  Result  Value Ref Range Status   Campylobacter species NOT DETECTED NOT DETECTED Final   Plesimonas shigelloides NOT DETECTED NOT DETECTED Final   Salmonella species NOT DETECTED NOT DETECTED Final   Yersinia enterocolitica NOT DETECTED NOT DETECTED Final   Vibrio species NOT DETECTED NOT DETECTED Final   Vibrio cholerae NOT DETECTED NOT DETECTED Final   Enteroaggregative E coli (EAEC) NOT DETECTED NOT DETECTED Final   Enteropathogenic E coli (EPEC) NOT DETECTED NOT DETECTED Final   Enterotoxigenic E coli (ETEC) NOT DETECTED NOT DETECTED Final   Shiga like toxin producing E coli (STEC) NOT DETECTED NOT DETECTED Final   Shigella/Enteroinvasive E  coli (EIEC) NOT DETECTED NOT DETECTED Final   Cryptosporidium NOT DETECTED NOT DETECTED Final   Cyclospora cayetanensis NOT DETECTED NOT DETECTED Final   Entamoeba histolytica NOT DETECTED NOT DETECTED Final   Giardia lamblia NOT DETECTED NOT DETECTED Final   Adenovirus F40/41 NOT DETECTED NOT DETECTED Final   Astrovirus NOT DETECTED NOT DETECTED Final   Norovirus GI/GII NOT DETECTED NOT DETECTED Final   Rotavirus A NOT DETECTED NOT DETECTED Final   Sapovirus (I, II, IV, and V) NOT DETECTED NOT DETECTED Final    Coagulation Studies: Recent Labs    06/21/17 1854  LABPROT 13.3  INR 1.02    Urinalysis: No results for input(s): COLORURINE, LABSPEC, PHURINE, GLUCOSEU, HGBUR, BILIRUBINUR, KETONESUR, PROTEINUR, UROBILINOGEN, NITRITE, LEUKOCYTESUR in the last 72 hours.  Invalid input(s): APPERANCEUR    Imaging: Dg Chest Portable 1 View  Result Date: 06/21/2017 CLINICAL DATA:  Failed graft in left arm EXAM: PORTABLE CHEST 1 VIEW COMPARISON:  11/23/2016 FINDINGS: Left-sided pacing device as before. No acute pulmonary infiltrate or effusion. Normal cardiomediastinal silhouette with atherosclerosis. No pneumothorax. IMPRESSION: No active disease. Electronically Signed   By: Melissa PangKim  Bradshaw M.D.   On: 06/21/2017 19:01     Medications:   . sodium chloride     . calcium acetate  2,001 mg Oral TID WC  . docusate sodium  100 mg Oral QODAY  . furosemide  20 mg Oral Daily  . heparin  5,000 Units Subcutaneous Q8H  . insulin aspart  10 Units Intravenous Once  . latanoprost  1 drop Both Eyes QHS  . pantoprazole  40 mg Oral Daily  . sodium chloride flush  3 mL Intravenous Q12H   sodium chloride, acetaminophen **OR** acetaminophen, diphenhydrAMINE, HYDROcodone-acetaminophen, morphine injection, ondansetron **OR** ondansetron (ZOFRAN) IV, sodium chloride flush  Assessment/ Plan:  Melissa Bradshaw is a 66 y.o. black female with end stage renal disease on hemodialysis, anemia, hypertension,  polycystic Bradshaw disease, history of pacemaker, history of TIA, vertebral artery aneurysm status post coiling  MWF [facility] [address] Left AVG EDW 54.5kg  1. End-stage renal disease: with hyperkalemia, metabolic acidosis and uremic on admission. With complication of dialysis device.  Permcath to be repositioned.  Appreciate vascular input. Discussed case with Dr. Gilda CreaseSchnier.   2. Hypertension: blood pressure low - history of difficult to control. Home regimen of amlodipine, furosemide, and labetolol. Recommend holding medications.   3. Anemia chronic Bradshaw disease:  Hemoglobin 13.4 - normal range.  - holding EPO  4. Secondary hyperparathyroidism: with hyperphosphatemia and hypocalcemia. Holding cinacalcet due to hypocalcemia - calcium acetate with meals.     LOS: 2 Eilis Chestnutt 12/21/20183:24 PM

## 2017-06-23 NOTE — Progress Notes (Signed)
Dr. Gilda CreaseSchnier at bedside to discuss upcoming procedure

## 2017-06-23 NOTE — Progress Notes (Deleted)
Dr. Schnier at bedside to discuss upcoming procedure 

## 2017-06-23 NOTE — Op Note (Signed)
OPERATIVE NOTE    PRE-OPERATIVE DIAGNOSIS: 1. ESRD 2. Non-functional permcath  POST-OPERATIVE DIAGNOSIS: same as above  PROCEDURE: 1. Fluoroscopic guidance for placement of catheter 2. Placement of a 19 cm tip to cuff tunneled hemodialysis catheter via the right internal jugular vein and removal of previous catheter  SURGEON: Festus BarrenJason Dew, MD  ANESTHESIA:  Local with moderate conscious sedation for 15 minutes using 1 mg of Versed and 25 mcg of Fentanyl  ESTIMATED BLOOD LOSS: 3 cc  FINDING(S): none  SPECIMEN(S):  None  INDICATIONS:   Patient is a 66 y.o.female who presents with non-functional dialysis catheter and ESRD.  The patient needs long term dialysis access for their ESRD, and a Permcath is necessary.  Risks and benefits are discussed and informed consent is obtained.    DESCRIPTION: After obtaining full informed written consent, the patient was brought back to the vascular suite. The patient received moderate conscious sedation during a face-to-face encounter with me present throughout the entire procedure and supervising the RN monitoring the vital signs, pulse oximetry, telemetry, and mental status throughout the entire procedure. The patient's existing catheter, right neck and chest were sterilely prepped and draped in a sterile surgical field was created.  The existing catheter was dissected free from the fibrous sheath securing the cuff with hemostats and blunt dissection.  A wire was placed. The existing catheter was then removed and the wire used to keep venous access. I selected a 19 cm tip to cuff tunneled dialysis catheter.  Using fluoroscopic guidance the catheter tips were parked in the right atrium. The appropriate distal connectors were placed. It withdrew blood well and flushed easily with heparinized saline and a concentrated heparin solution was then placed. It was secured to the chest wall with 2 Prolene sutures. A 4-0 Monocryl pursestring suture was placed around  the exit site. Sterile dressings were placed. The patient tolerated the procedure well and was taken to the recovery room in stable condition.  COMPLICATIONS: None  CONDITION: Stable  Festus BarrenJason Dew 06/23/2017 5:08 PM   This note was created with Dragon Medical transcription system. Any errors in dictation are purely unintentional.

## 2017-06-23 NOTE — H&P (Signed)
[facility] History & Physical Update  The patient was interviewed and re-examined.  The patient's previous History and Physical has been reviewed and is unchanged other than her catheter is not working well and needs to be exchanged.  There is no change in the plan of care. We plan to proceed with the scheduled procedure.  Festus BarrenJason Dew, MD  06/23/2017, 4:12 PM

## 2017-06-23 NOTE — Progress Notes (Signed)
Patient in room c/o of itching and scratching; Notified Dr. Katheren ShamsSalary verbal order to give benadryl IV q 6hrs as needed; patient pulled and  removed telemetry several times;  Upon arrival patient in room asymptomatic but continuly scratching; educated patient on importance of telemetry and new order, Will continue to monitor

## 2017-06-25 ENCOUNTER — Encounter: Payer: Self-pay | Admitting: Vascular Surgery

## 2017-06-26 DIAGNOSIS — Z992 Dependence on renal dialysis: Secondary | ICD-10-CM | POA: Diagnosis not present

## 2017-06-26 DIAGNOSIS — N186 End stage renal disease: Secondary | ICD-10-CM | POA: Diagnosis not present

## 2017-06-27 NOTE — Discharge Summary (Signed)
[facility] [place] [facility], 66 y.o., DOB Apr 13, 1951, MRN [id number]. Admission date: 06/21/2017 Discharge Date 06/27/2017 Primary MD Corky Downs, MD Admitting Physician Bertrum Sol, MD  Admission Diagnosis  Acute hyperkalemia [E87.5] End stage renal disease on dialysis [facility]) [N18.6, Z99.2] ESRD (end stage renal disease) on dialysis (HCC) [N18.6, Z99.2] Clotted dialysis access, subsequent encounter [facility]) [T82.49XD]  Discharge Diagnosis   Active Problems:   ESRD (end stage renal disease) on dialysis (HCC)   dysfunctional left arm AV fistula   Acute hyperkalemia   History of PUD   Anemia of chronic disease   CAD   GU  HTN      Hospital Course Melissa Bradshaw  is a 66 y.o. female with a known history per below sent by primary care provider's office for dysfunctional left arm AV fistula, has not had hemodialysis in over 2 weeks, in the emergency room potassium was 7.1, creatinine 18, BUN 88, patient in no apparent distress, resting comfortably in bed, patient is now been admitted for chronic end-stage renal disease on hemodialysis, acute dysfunctional left arm AV fistula, and acute hyperkalemia. Pt was admitted for PermCath placement.  Patient had a PermCath placed however was not working so had to be taken again for placement of a cough tunneled hemodialysis catheter.  Patient had this placed after that she had dialysis and was able to be discharged home.               Consults  nephrology  Significant Tests:  See full reports for all details    Ct Abdomen Pelvis Wo Contrast  Result Date: 06/01/2017 CLINICAL DATA:  Diarrhea with abdominal pain and nausea EXAM: CT ABDOMEN AND PELVIS WITHOUT CONTRAST TECHNIQUE: Multidetector CT imaging of the abdomen and pelvis was performed following the standard protocol without IV contrast. COMPARISON:  06/05/2016, 06/06/2015 FINDINGS: Lower chest: Lung bases demonstrate no acute  consolidation or pleural effusion. Partially visualized cardiac pacing leads. Trace pericardial effusion. Hepatobiliary: Innumerable cysts throughout the liver parenchyma. Liver is enlarged. Some of the cysts demonstrate increased density. For example 9 cm anterior cyst, series 2, image number 41. Left lobe cyst measuring 4.9 cm. Gallbladder poorly visualized due to the numerous cysts and ascites in the abdomen. No biliary dilatation. Pancreas: Unremarkable. No pancreatic ductal dilatation or surrounding inflammatory changes. Spleen: Normal in size without focal abnormality. Adrenals/Urinary Tract: Difficult visualization of right adrenal gland. Left adrenal gland appears within normal limits. Enlarged polycystic kidneys bilaterally with scattered calcifications and areas of increased density as before. The bladder is unremarkable. Stomach/Bowel: The stomach is nonenlarged. No dilated small bowel. No colon wall thickening. Normal appendix. Vascular/Lymphatic: Extensive atherosclerotic vascular disease. No significantly enlarged lymph nodes Reproductive: Uterus and bilateral adnexa are unremarkable. Other: Moderate to large volume of ascites in the abdomen and pelvis. No free air. Fluid in the umbilical region. Musculoskeletal: Degenerative changes. No acute or suspicious lesion IMPRESSION: 1. Negative for bowel obstruction or bowel wall thickening. Normal appendix. 2. Polycystic kidneys as before. Polycystic liver. Several of the liver cysts demonstrate increased internal density suggesting hemorrhage or proteinaceous debris. 3. Moderate to large volume of ascites. Electronically Signed   By: Jasmine Pang M.D.   On: 06/01/2017 22:19   US Abdomen Limited  Result Date: 06/02/2017 CLINICAL DATA:  Recurrent ascites. Paracentesis on 05/30/2017. Nausea and pain. EXAM: LIMITED ABDOMEN ULTRASOUND FOR ASCITES TECHNIQUE: Limited ultrasound survey for ascites was performed in all four abdominal quadrants. COMPARISON:   06/01/2017 and  05/30/2017 FINDINGS: Small amount of ascites in the right upper quadrant and right lower quadrant. No significant ascites in the left upper quadrant or left lower quadrant. Again noted are multiple hepatic cysts. IMPRESSION: Small amount of ascites in the right abdomen. Paracentesis was not performed due to the small volume. Electronically Signed   By: Richarda OverlieAdam  Henn M.D.   On: 06/02/2017 15:19   [place] Paracentesis  Result Date: 06/19/2017 INDICATION: 66 year old female with autosomal dominant polycystic kidney disease and recurrent large volume ascites. She presents for paracentesis. EXAM: ULTRASOUND GUIDED  PARACENTESIS MEDICATIONS: None. COMPLICATIONS: None immediate. PROCEDURE: Informed written consent was obtained from the patient after a discussion of the risks, benefits and alternatives to treatment. A timeout was performed prior to the initiation of the procedure. Initial ultrasound scanning demonstrates a large amount of ascites within the right lower abdominal quadrant. The right lower abdomen was prepped and draped in the usual sterile fashion. 1% lidocaine with epinephrine was used for local anesthesia. Following this, a 6 Fr Safe-T-Centesis catheter was introduced. An ultrasound image was saved for documentation purposes. The paracentesis was performed. The catheter was removed and a dressing was applied. The patient tolerated the procedure well without immediate post procedural complication. FINDINGS: A total of approximately 3900 mL of clear yellow ascitic fluid was removed. Samples were sent to the laboratory as requested by the clinical team. IMPRESSION: Successful ultrasound-guided paracentesis yielding 3.9 liters of peritoneal fluid. Electronically Signed   By: Malachy MoanHeath  McCullough M.D.   On: 06/19/2017 12:42   [place] Paracentesis  Result Date: 06/14/2017 CLINICAL DATA:  Chronic renal disease, recurrent abdominal ascites EXAM: ULTRASOUND GUIDED PARACENTESIS TECHNIQUE: The procedure,  risks (including but not limited to bleeding, infection, organ damage ), benefits, and alternatives were explained to the patient. Questions regarding the procedure were encouraged and answered. The patient understands and consents to the procedure. Survey ultrasound of the abdomen was performed and an appropriate skin entry site in the right lower abdomen was selected. Skin site was marked, prepped with chlorhexadine, draped in usual sterile fashion, and infiltrated locally with 1% lidocaine. A Safe-T-Centesis needle was advanced into the peritoneal space until fluid could be aspirated. The sheath was advanced and the needle removed. 5.8 L of clear yellow ascites were aspirated. The patient tolerated the procedure well. COMPLICATIONS: COMPLICATIONS none IMPRESSION: Technically successful ultrasound guided paracentesis, removing 5.8 L ascites. Electronically Signed   By: Corlis Leak  Hassell M.D.   On: 06/14/2017 16:13   [place] Paracentesis  Result Date: 05/30/2017 INDICATION: End stage renal disease on hemodialysis. Congestive heart failure. Recurrent abdominal ascites. Request for therapeutic paracentesis. EXAM: ULTRASOUND GUIDED LEFT LATERAL ABDOMEN PARACENTESIS MEDICATIONS: 1% Lidocaine = 15 mL COMPLICATIONS: None immediate. PROCEDURE: Informed written consent was obtained from the patient after a discussion of the risks, benefits and alternatives to treatment. A timeout was performed prior to the initiation of the procedure. Initial ultrasound scanning demonstrates a large amount of ascites within the left lateral abdomen. The left lateral abdomen was prepped and draped in the usual sterile fashion. 1% lidocaine with epinephrine was used for local anesthesia. Following this, a 19 gauge, 10-cm, Yueh catheter was introduced. An ultrasound image was saved for documentation purposes. The paracentesis was performed. The catheter was removed and a dressing was applied. The patient tolerated the procedure well without  immediate post procedural complication. FINDINGS: A total of approximately 6 liters of clear yellow fluid was removed. IMPRESSION: Successful ultrasound-guided paracentesis yielding 6 liters of peritoneal fluid. Read by:  Corrin ParkerWendy Blair,  PA-C Electronically Signed   By: Gilmer Mor D.O.   On: 05/30/2017 11:31   Dg Chest Portable 1 View  Result Date: 06/21/2017 CLINICAL DATA:  Failed graft in left arm EXAM: PORTABLE CHEST 1 VIEW COMPARISON:  11/23/2016 FINDINGS: Left-sided pacing device as before. No acute pulmonary infiltrate or effusion. Normal cardiomediastinal silhouette with atherosclerosis. No pneumothorax. IMPRESSION: No active disease. Electronically Signed   By: Jasmine Pang M.D.   On: 06/21/2017 19:01       Today   Subjective:   Melissa Bradshaw patient very upset not being able to go home I have explained to her she will be able to go home after dialysis  Objective:   Blood pressure (!) 92/55, pulse 94, temperature 98 F (36.7 C), temperature source Oral, resp. rate 15, height 5\' 6"  (1.676 m), weight 124 lb 5.4 oz (56.4 kg), SpO2 98 %.  . No intake or output data in the 24 hours ending 06/27/17 1432  Exam VITAL SIGNS: Blood pressure (!) 92/55, pulse 94, temperature 98 F (36.7 C), temperature source Oral, resp. rate 15, height 5\' 6"  (1.676 m), weight 124 lb 5.4 oz (56.4 kg), SpO2 98 %.  GENERAL:  66 y.o.-year-old patient lying in the bed with no acute distress.  EYES: Pupils equal, round, reactive to light and accommodation. No scleral icterus. Extraocular muscles intact.  HEENT: Head atraumatic, normocephalic. Oropharynx and nasopharynx clear.  NECK:  Supple, no jugular venous distention. No thyroid enlargement, no tenderness.  LUNGS: Normal breath sounds bilaterally, no wheezing, rales,rhonchi or crepitation. No use of accessory muscles of respiration.  CARDIOVASCULAR: S1, S2 normal. No murmurs, rubs, or gallops.  ABDOMEN: Soft, nontender, nondistended. Bowel sounds present.  No organomegaly or mass.  EXTREMITIES: No pedal edema, cyanosis, or clubbing.  NEUROLOGIC: Cranial nerves II through XII are intact. Muscle strength 5/5 in all extremities. Sensation intact. Gait not checked.  PSYCHIATRIC: The patient is alert and oriented x 3.  SKIN: No obvious rash, lesion, or ulcer.   Data Review     CBC w Diff:  Lab Results  Component Value Date   WBC 5.4 06/21/2017   HGB 13.4 06/21/2017   HGB 8.8 (L) 10/09/2014   HCT 40.4 06/21/2017   HCT 28.3 (L) 05/22/2014   PLT 386 06/21/2017   PLT 236 05/22/2014   LYMPHOPCT 27 06/21/2017   LYMPHOPCT 29.7 05/22/2014   MONOPCT 10 06/21/2017   MONOPCT 6.3 05/22/2014   EOSPCT 1 06/21/2017   EOSPCT 2.1 05/22/2014   BASOPCT 1 06/21/2017   BASOPCT 0.9 05/22/2014   CMP:  Lab Results  Component Value Date   NA 138 06/22/2017   NA 141 01/30/2014   K 4.4 06/22/2017   K 5.0 01/30/2014   CL 98 (L) 06/22/2017   CL 116 (H) 01/30/2014   CO2 21 (L) 06/22/2017   CO2 17 (L) 01/30/2014   BUN 89 (H) 06/22/2017   BUN 50 (H) 06/19/2014   CREATININE 18.62 (H) 06/22/2017   CREATININE 3.33 (H) 06/19/2014   PROT 5.9 (L) 06/14/2017   PROT 7.1 05/14/2013   ALBUMIN 2.3 (L) 06/14/2017   ALBUMIN 3.9 01/30/2014   BILITOT 0.8 06/14/2017   BILITOT 0.3 05/14/2013   ALKPHOS 53 06/14/2017   ALKPHOS 44 (L) 05/14/2013   AST 21 06/14/2017   AST 14 (L) 05/14/2013   ALT 13 (L) 06/14/2017   ALT 13 05/14/2013  .  Micro Results No results found for this or any previous visit (from the past 240 hour(s)).  Code Status History    Date Active Date Inactive Code Status Order ID Comments User Context   06/21/2017 22:31 06/24/2017 01:52 Full Code [id number]  Bertrum SolSalary, Montell D, MD Inpatient   06/01/2017 13:07 06/03/2017 20:19 Full Code [id number]  Katha HammingKonidena, Snehalatha, MD Inpatient   05/31/2016 21:01 06/07/2016 18:40 Full Code [id number]  Altamese DillingVachhani, Vaibhavkumar, MD Inpatient          Follow-up Information    Corky DownsMasoud, Javed, MD Follow up in 1  week(s).   Specialty:  Internal Medicine Contact information: [address] [place] [address] [phone]           Discharge Medications   Allergies as of 06/23/2017      Reactions   Hydralazine Hcl Other (See Comments)   Makes pt jittery, nervous, messes with vision    Iodine Itching, Rash   Unknown reaction   Ivp Dye [iodinated Diagnostic Agents] Rash      Medication List    TAKE these medications   aspirin 81 MG tablet Take 81 mg by mouth daily.   calcium acetate 667 MG capsule Commonly known as:  PHOSLO Take 2,001 mg by mouth 3 (three) times daily with meals.   docusate sodium 100 MG capsule Commonly known as:  COLACE Take 1 capsule (100 mg total) by mouth 2 (two) times daily. What changed:  when to take this   furosemide 20 MG tablet Commonly known as:  LASIX Take 20 mg by mouth daily.   latanoprost 0.005 % ophthalmic solution Commonly known as:  XALATAN Place 1 drop into both eyes at bedtime.   lidocaine-prilocaine cream Commonly known as:  EMLA   ondansetron 4 MG disintegrating tablet Commonly known as:  ZOFRAN ODT Take 1 tablet (4 mg total) by mouth every 8 (eight) hours as needed for nausea or vomiting.          Total Time in preparing paper work, data evaluation and todays exam - 35 minutes  Auburn BilberryPATEL, Broghan Pannone M.D on 06/27/2017 at 2:32 PM  [facility]   Office  [phone]

## 2017-06-28 ENCOUNTER — Ambulatory Visit: Admission: RE | Admit: 2017-06-28 | Payer: Medicare Other | Source: Ambulatory Visit

## 2017-06-29 ENCOUNTER — Ambulatory Visit (INDEPENDENT_AMBULATORY_CARE_PROVIDER_SITE_OTHER): Payer: Medicare Other | Admitting: Vascular Surgery

## 2017-06-29 ENCOUNTER — Ambulatory Visit
Admission: RE | Admit: 2017-06-29 | Discharge: 2017-06-29 | Disposition: A | Discharge status: Home / Self Care | Payer: Medicare Other | Source: Ambulatory Visit | Attending: Nephrology | Admitting: Nephrology

## 2017-06-29 DIAGNOSIS — R188 Other ascites: Secondary | ICD-10-CM | POA: Diagnosis not present

## 2017-07-03 ENCOUNTER — Ambulatory Visit (INDEPENDENT_AMBULATORY_CARE_PROVIDER_SITE_OTHER): Payer: Medicare Other | Admitting: Vascular Surgery

## 2017-07-03 DIAGNOSIS — Z992 Dependence on renal dialysis: Secondary | ICD-10-CM | POA: Diagnosis not present

## 2017-07-03 DIAGNOSIS — N186 End stage renal disease: Secondary | ICD-10-CM | POA: Diagnosis not present

## 2017-07-05 ENCOUNTER — Ambulatory Visit
Admission: RE | Admit: 2017-07-05 | Discharge: 2017-07-05 | Disposition: A | Discharge status: Home / Self Care | Payer: Medicare Other | Source: Ambulatory Visit | Attending: Nephrology | Admitting: Nephrology

## 2017-07-05 DIAGNOSIS — R188 Other ascites: Secondary | ICD-10-CM | POA: Diagnosis not present

## 2017-07-11 ENCOUNTER — Other Ambulatory Visit: Payer: Self-pay

## 2017-07-11 ENCOUNTER — Encounter: Payer: Self-pay | Admitting: Emergency Medicine

## 2017-07-11 ENCOUNTER — Inpatient Hospital Stay: Payer: Medicare Other

## 2017-07-11 ENCOUNTER — Ambulatory Visit: Admission: RE | Admit: 2017-07-11 | Payer: Medicare Other | Source: Ambulatory Visit

## 2017-07-11 ENCOUNTER — Inpatient Hospital Stay
Admission: EM | Admit: 2017-07-11 | Discharge: 2017-07-18 | DRG: 291 | Disposition: A | Discharge status: Home Health | Payer: Medicare Other | Attending: Family Medicine | Admitting: Family Medicine

## 2017-07-11 DIAGNOSIS — F17210 Nicotine dependence, cigarettes, uncomplicated: Secondary | ICD-10-CM | POA: Diagnosis present

## 2017-07-11 DIAGNOSIS — I132 Hypertensive heart and chronic kidney disease with heart failure and with stage 5 chronic kidney disease, or end stage renal disease: Secondary | ICD-10-CM | POA: Diagnosis not present

## 2017-07-11 DIAGNOSIS — E8770 Fluid overload, unspecified: Secondary | ICD-10-CM | POA: Diagnosis not present

## 2017-07-11 DIAGNOSIS — Z950 Presence of cardiac pacemaker: Secondary | ICD-10-CM | POA: Diagnosis not present

## 2017-07-11 DIAGNOSIS — R627 Adult failure to thrive: Secondary | ICD-10-CM | POA: Diagnosis present

## 2017-07-11 DIAGNOSIS — Q613 Polycystic kidney, unspecified: Secondary | ICD-10-CM

## 2017-07-11 DIAGNOSIS — Z992 Dependence on renal dialysis: Secondary | ICD-10-CM | POA: Diagnosis not present

## 2017-07-11 DIAGNOSIS — Z888 Allergy status to other drugs, medicaments and biological substances status: Secondary | ICD-10-CM

## 2017-07-11 DIAGNOSIS — I5023 Acute on chronic systolic (congestive) heart failure: Secondary | ICD-10-CM | POA: Diagnosis not present

## 2017-07-11 DIAGNOSIS — R188 Other ascites: Secondary | ICD-10-CM | POA: Diagnosis not present

## 2017-07-11 DIAGNOSIS — E43 Unspecified severe protein-calorie malnutrition: Secondary | ICD-10-CM | POA: Diagnosis not present

## 2017-07-11 DIAGNOSIS — H409 Unspecified glaucoma: Secondary | ICD-10-CM | POA: Diagnosis present

## 2017-07-11 DIAGNOSIS — I2510 Atherosclerotic heart disease of native coronary artery without angina pectoris: Secondary | ICD-10-CM | POA: Diagnosis not present

## 2017-07-11 DIAGNOSIS — A0471 Enterocolitis due to Clostridium difficile, recurrent: Secondary | ICD-10-CM | POA: Diagnosis not present

## 2017-07-11 DIAGNOSIS — E872 Acidosis: Secondary | ICD-10-CM | POA: Diagnosis present

## 2017-07-11 DIAGNOSIS — I5033 Acute on chronic diastolic (congestive) heart failure: Secondary | ICD-10-CM | POA: Diagnosis present

## 2017-07-11 DIAGNOSIS — Z9119 Patient's noncompliance with other medical treatment and regimen: Secondary | ICD-10-CM | POA: Diagnosis not present

## 2017-07-11 DIAGNOSIS — I959 Hypotension, unspecified: Secondary | ICD-10-CM | POA: Diagnosis not present

## 2017-07-11 DIAGNOSIS — Z7189 Other specified counseling: Secondary | ICD-10-CM | POA: Diagnosis not present

## 2017-07-11 DIAGNOSIS — R1110 Vomiting, unspecified: Secondary | ICD-10-CM | POA: Diagnosis not present

## 2017-07-11 DIAGNOSIS — Z9115 Patient's noncompliance with renal dialysis: Secondary | ICD-10-CM

## 2017-07-11 DIAGNOSIS — E869 Volume depletion, unspecified: Secondary | ICD-10-CM | POA: Diagnosis not present

## 2017-07-11 DIAGNOSIS — Z66 Do not resuscitate: Secondary | ICD-10-CM | POA: Diagnosis not present

## 2017-07-11 DIAGNOSIS — Z8673 Personal history of transient ischemic attack (TIA), and cerebral infarction without residual deficits: Secondary | ICD-10-CM

## 2017-07-11 DIAGNOSIS — Z823 Family history of stroke: Secondary | ICD-10-CM

## 2017-07-11 DIAGNOSIS — Z8711 Personal history of peptic ulcer disease: Secondary | ICD-10-CM

## 2017-07-11 DIAGNOSIS — I120 Hypertensive chronic kidney disease with stage 5 chronic kidney disease or end stage renal disease: Secondary | ICD-10-CM | POA: Diagnosis not present

## 2017-07-11 DIAGNOSIS — R531 Weakness: Secondary | ICD-10-CM

## 2017-07-11 DIAGNOSIS — R1115 Cyclical vomiting syndrome unrelated to migraine: Secondary | ICD-10-CM

## 2017-07-11 DIAGNOSIS — Z91041 Radiographic dye allergy status: Secondary | ICD-10-CM

## 2017-07-11 DIAGNOSIS — R0602 Shortness of breath: Secondary | ICD-10-CM

## 2017-07-11 DIAGNOSIS — Z681 Body mass index (BMI) 19 or less, adult: Secondary | ICD-10-CM | POA: Diagnosis not present

## 2017-07-11 DIAGNOSIS — D631 Anemia in chronic kidney disease: Secondary | ICD-10-CM | POA: Diagnosis present

## 2017-07-11 DIAGNOSIS — N186 End stage renal disease: Secondary | ICD-10-CM | POA: Diagnosis not present

## 2017-07-11 DIAGNOSIS — N2581 Secondary hyperparathyroidism of renal origin: Secondary | ICD-10-CM | POA: Diagnosis not present

## 2017-07-11 DIAGNOSIS — E875 Hyperkalemia: Secondary | ICD-10-CM | POA: Diagnosis present

## 2017-07-11 DIAGNOSIS — Z8249 Family history of ischemic heart disease and other diseases of the circulatory system: Secondary | ICD-10-CM

## 2017-07-11 DIAGNOSIS — N189 Chronic kidney disease, unspecified: Secondary | ICD-10-CM | POA: Diagnosis not present

## 2017-07-11 DIAGNOSIS — Z515 Encounter for palliative care: Secondary | ICD-10-CM | POA: Diagnosis not present

## 2017-07-11 LAB — BASIC METABOLIC PANEL
Anion gap: 31 — ABNORMAL HIGH (ref 5–15)
BUN: 104 mg/dL — AB (ref 6–20)
CALCIUM: 5.5 mg/dL — AB (ref 8.9–10.3)
CHLORIDE: 96 mmol/L — AB (ref 101–111)
CO2: 32 mmol/L (ref 22–32)
Creatinine, Ser: 23.76 mg/dL — ABNORMAL HIGH (ref 0.44–1.00)
GFR calc non Af Amer: 1 mL/min — ABNORMAL LOW (ref >60)
GFR, EST AFRICAN AMERICAN: 1 mL/min — AB (ref >60)
GLUCOSE: 68 mg/dL (ref 65–99)
Potassium: 6.5 mmol/L (ref 3.5–5.1)
Sodium: 159 mmol/L — ABNORMAL HIGH (ref 135–145)

## 2017-07-11 LAB — CBC
HCT: 46.5 % (ref 35.0–47.0)
HEMOGLOBIN: 15 g/dL (ref 12.0–16.0)
MCH: 29 pg (ref 26.0–34.0)
MCHC: 32.2 g/dL (ref 32.0–36.0)
MCV: 90.3 fL (ref 80.0–100.0)
Platelets: 298 10*3/uL (ref 150–440)
RBC: 5.15 MIL/uL (ref 3.80–5.20)
RDW: 17.5 % — ABNORMAL HIGH (ref 11.5–14.5)
WBC: 7.1 10*3/uL (ref 3.6–11.0)

## 2017-07-11 LAB — URIC ACID: Uric Acid, Serum: 13.5 mg/dL — ABNORMAL HIGH (ref 2.3–6.6)

## 2017-07-11 LAB — ALBUMIN: Albumin: 2.2 g/dL — ABNORMAL LOW (ref 3.5–5.0)

## 2017-07-11 LAB — TROPONIN I: Troponin I: 0.16 ng/mL (ref <0.03)

## 2017-07-11 LAB — MRSA PCR SCREENING: MRSA BY PCR: NEGATIVE

## 2017-07-11 MED ORDER — ACETAMINOPHEN 650 MG RE SUPP: 650.0000 mg | Freq: Four times a day (QID) | RECTAL | Status: DC | PRN

## 2017-07-11 MED ORDER — ACETAMINOPHEN 325 MG PO TABS: 650.0000 mg | ORAL_TABLET | Freq: Four times a day (QID) | ORAL | Status: DC | PRN

## 2017-07-11 MED ORDER — POLYETHYLENE GLYCOL 3350 17 G PO PACK: 17.0000 g | PACK | Freq: Every day | ORAL | Status: DC | PRN

## 2017-07-11 MED ORDER — ALBUMIN HUMAN 25 % IV SOLN
25.0000 g | INTRAVENOUS | Status: AC
  Administered 2017-07-11: 25 g via INTRAVENOUS
  Filled 2017-07-11: qty 100

## 2017-07-11 MED ORDER — SODIUM BICARBONATE 8.4 % IV SOLN
INTRAVENOUS | Status: AC
  Filled 2017-07-11: qty 50

## 2017-07-11 MED ORDER — HEPARIN SODIUM (PORCINE) 5000 UNIT/ML IJ SOLN
5000.0000 [IU] | Freq: Three times a day (TID) | INTRAMUSCULAR | Status: DC
  Administered 2017-07-11 – 2017-07-18 (×17): 5000 [IU] via SUBCUTANEOUS
  Filled 2017-07-11 (×16): qty 1

## 2017-07-11 MED ORDER — CALCIUM GLUCONATE 10 % IV SOLN
INTRAVENOUS | Status: AC
  Filled 2017-07-11: qty 10

## 2017-07-11 MED ORDER — SODIUM BICARBONATE 8.4 % IV SOLN
50.0000 meq | Freq: Once | INTRAVENOUS | Status: AC
  Administered 2017-07-11: 50 meq via INTRAVENOUS

## 2017-07-11 MED ORDER — ONDANSETRON HCL 4 MG PO TABS
4.0000 mg | ORAL_TABLET | Freq: Four times a day (QID) | ORAL | Status: DC | PRN
  Administered 2017-07-15 (×2): 4 mg via ORAL
  Filled 2017-07-11 (×3): qty 1

## 2017-07-11 MED ORDER — ACETAMINOPHEN-CODEINE #3 300-30 MG PO TABS
1.0000 | ORAL_TABLET | Freq: Four times a day (QID) | ORAL | Status: DC | PRN
  Administered 2017-07-11 – 2017-07-17 (×10): 1 via ORAL
  Filled 2017-07-11 (×14): qty 1

## 2017-07-11 MED ORDER — SODIUM CHLORIDE 0.9 % IV SOLN
1.0000 g | Freq: Once | INTRAVENOUS | Status: AC
  Administered 2017-07-11: 1 g via INTRAVENOUS
  Filled 2017-07-11 (×2): qty 10

## 2017-07-11 MED ORDER — ONDANSETRON HCL 4 MG/2ML IJ SOLN
4.0000 mg | Freq: Four times a day (QID) | INTRAMUSCULAR | Status: DC | PRN
  Administered 2017-07-12 – 2017-07-14 (×4): 4 mg via INTRAVENOUS
  Filled 2017-07-11 (×4): qty 2

## 2017-07-11 MED ORDER — ENOXAPARIN SODIUM 30 MG/0.3ML ~~LOC~~ SOLN: 30.0000 mg | SUBCUTANEOUS | Status: DC

## 2017-07-11 NOTE — ED Notes (Signed)
Pt waiting on admission.

## 2017-07-11 NOTE — ED Notes (Signed)
Resumed care frfom ashley rn.  Pt alert.  Labs sent.  Paced rhythm on monitor.  Family with pt.  Iv in place.

## 2017-07-11 NOTE — Progress Notes (Signed)
[facility]  ROUNDING NOTE   Subjective:   Last outpatient hemodialysis was 12/24.   Brought by her daughter to ED. Patient states she is upset and not feeling well for to come for dialysis.   Labs are pending.   Objective:  Vital signs in last 24 hours:  Temp:  [97.8 F (36.6 C)] 97.8 F (36.6 C) (01/08 1332) Pulse Rate:  [62-69] 69 (01/08 1500) Resp:  [11-23] 23 (01/08 1540) BP: (115-137)/(66-86) 137/80 (01/08 1540) SpO2:  [100 %] 100 % (01/08 1500) Weight:  [56.2 kg (124 lb)] 56.2 kg (124 lb) (01/08 1333)  Weight change:  Filed Weights   07/11/17 1333  Weight: 56.2 kg (124 lb)    Intake/Output: No intake/output data recorded.   Intake/Output this shift:  No intake/output data recorded.  Physical Exam: General: NAD, laying in bed  Head: Normocephalic, atraumatic. Moist oral mucosal membranes  Eyes: Anicteric, PERRL  Neck: Supple, trachea midline  Lungs:  Clear to auscultation  Heart: Regular rate and rhythm  Abdomen:  Soft, +umbilical hernia  Extremities:  trace peripheral edema.  Neurologic: Nonfocal, moving all four extremities  Skin: No lesions  Access: Left AVG no bruit or thrill, RIJ permcath 12/20 Dr. Wyn Quakerew    Basic Metabolic Panel: No results for input(s): NA, K, CL, CO2, GLUCOSE, BUN, CREATININE, CALCIUM, MG, PHOS in the last 168 hours.  Liver Function Tests: No results for input(s): AST, ALT, ALKPHOS, BILITOT, PROT, ALBUMIN in the last 168 hours. No results for input(s): LIPASE, AMYLASE in the last 168 hours. No results for input(s): AMMONIA in the last 168 hours.  CBC: Recent Labs  Lab 07/11/17 1348  WBC 7.1  HGB 15.0  HCT 46.5  MCV 90.3  PLT 298    Cardiac Enzymes: No results for input(s): CKTOTAL, CKMB, CKMBINDEX, TROPONINI in the last 168 hours.  BNP: Invalid input(s): POCBNP  CBG: No results for input(s): GLUCAP in the last 168 hours.  Microbiology: Results for orders placed or performed during the hospital  encounter of 06/01/17  MRSA PCR Screening     Status: None   Collection Time: 06/01/17 12:24 PM  Result Value Ref Range Status   MRSA by PCR NEGATIVE NEGATIVE Final    Comment:        The GeneXpert MRSA Assay (FDA approved for NASAL specimens only), is one component of a comprehensive MRSA colonization surveillance program. It is not intended to diagnose MRSA infection nor to guide or monitor treatment for MRSA infections.   C difficile quick scan w PCR reflex     Status: Abnormal   Collection Time: 06/01/17 12:51 PM  Result Value Ref Range Status   C Diff antigen POSITIVE (A) NEGATIVE Final   C Diff toxin NEGATIVE NEGATIVE Final   C Diff interpretation Results are indeterminate. See PCR results.  Final  Clostridium Difficile by PCR     Status: Abnormal   Collection Time: 06/01/17 12:51 PM  Result Value Ref Range Status   Toxigenic C. Difficile by PCR POSITIVE (A) NEGATIVE Final    Comment: Positive for toxigenic C. difficile with little to no toxin production. Only treat if clinical presentation suggests symptomatic illness.  Gastrointestinal Panel by PCR , Stool     Status: None   Collection Time: 06/02/17 12:51 PM  Result Value Ref Range Status   Campylobacter species NOT DETECTED NOT DETECTED Final   Plesimonas shigelloides NOT DETECTED NOT DETECTED Final   Salmonella species NOT DETECTED NOT DETECTED Final   Yersinia  enterocolitica NOT DETECTED NOT DETECTED Final   Vibrio species NOT DETECTED NOT DETECTED Final   Vibrio cholerae NOT DETECTED NOT DETECTED Final   Enteroaggregative E coli (EAEC) NOT DETECTED NOT DETECTED Final   Enteropathogenic E coli (EPEC) NOT DETECTED NOT DETECTED Final   Enterotoxigenic E coli (ETEC) NOT DETECTED NOT DETECTED Final   Shiga like toxin producing E coli (STEC) NOT DETECTED NOT DETECTED Final   Shigella/Enteroinvasive E coli (EIEC) NOT DETECTED NOT DETECTED Final   Cryptosporidium NOT DETECTED NOT DETECTED Final   Cyclospora cayetanensis  NOT DETECTED NOT DETECTED Final   Entamoeba histolytica NOT DETECTED NOT DETECTED Final   Giardia lamblia NOT DETECTED NOT DETECTED Final   Adenovirus F40/41 NOT DETECTED NOT DETECTED Final   Astrovirus NOT DETECTED NOT DETECTED Final   Norovirus GI/GII NOT DETECTED NOT DETECTED Final   Rotavirus A NOT DETECTED NOT DETECTED Final   Sapovirus (I, II, IV, and V) NOT DETECTED NOT DETECTED Final    Coagulation Studies: No results for input(s): LABPROT, INR in the last 72 hours.  Urinalysis: No results for input(s): COLORURINE, LABSPEC, PHURINE, GLUCOSEU, HGBUR, BILIRUBINUR, KETONESUR, PROTEINUR, UROBILINOGEN, NITRITE, LEUKOCYTESUR in the last 72 hours.  Invalid input(s): APPERANCEUR    Imaging: No results found.   Medications:   . calcium gluconate 1 GM IV        Assessment/ Plan:  Ms. ANIELLA WANDREY is a 67 y.o. black female with end stage renal disease on hemodialysis, anemia, hypertension, polycystic kidney disease, history of pacemaker, history of TIA, vertebral artery aneurysm status post coiling  MWF [facility] [address] Left AVG EDW 54.5kg  1. End-stage renal disease: with hyperkalemia, metabolic acidosis and uremic on admission.  Emergent hemodialysis tonight. Orders prepared.   2. Hypertension: blood pressure at goal.  - history of difficult to control. Home regimen of amlodipine, furosemide, and labetolol. Recommend holding medications.   3. Anemia chronic kidney disease:  Hemoglobin 15- normal range.  - holding EPO  4. Secondary hyperparathyroidism: with hyperphosphatemia and hypocalcemia. Holding cinacalcet due to hypocalcemia - calcium acetate with meals.     LOS: 0 Nemiah Kissner 1/8/20193:46 PM

## 2017-07-11 NOTE — Progress Notes (Signed)
Post HD assessment  

## 2017-07-11 NOTE — ED Provider Notes (Signed)
[facility] Emergency Department Provider Note       Time seen: ----------------------------------------- 3:07 PM on 07/11/2017 -----------------------------------------   I have reviewed the triage vital signs and the nursing notes.  HISTORY   Chief Complaint Weakness    HPI Melissa Bradshaw is a 67 y.o. female with a history of anemia, CHF, chronic kidney disease on dialysis Tuesday Thursday and Saturday with hypertension who presents to the ED for missing dialysis for the last week.  Patient states she felt weak and did not drive herself and she did not have another ride.  She has had vomiting and generalized weakness for the last 7 days.  She states she has been very drowsy and fell asleep in triage here.  Past Medical History:  Diagnosis Date  . Anemia   . Aneurysm (HCC)    [place]  . Arthritis   . Cardiac pacemaker   . CHF (congestive heart failure) (HCC)   . Chronic kidney disease    ESRD  . Constipation   . Coronary artery disease   . Dialysis patient (HCC)    Tues, Thurs, Sat  . Gastric ulcer   . Glaucoma (increased eye pressure)   . Headache   . Hypertension   . Presence of permanent cardiac pacemaker     Patient Active Problem List   Diagnosis Date Noted  . ESRD (end stage renal disease) on dialysis (HCC) 06/21/2017  . Abdominal pain, epigastric 06/08/2017  . Hyperkalemia 06/01/2017  . Ascites 03/29/2017  . Polycystic liver disease 09/30/2016  . Pre-transplant evaluation for kidney transplant 09/30/2016  . Complication of vascular access for dialysis 08/15/2016  . Anemia 08/08/2016  . Protein-calorie malnutrition, severe 06/01/2016  . Fluid overload 05/31/2016  . Anemia, chronic renal failure 01/31/2016  . End stage renal disease (HCC) 01/01/2015  . Iron deficiency anemia 11/01/2014  . Essential (primary) hypertension 11/08/2012  . Artificial cardiac pacemaker 11/08/2012  . Congenital cystic  disease of kidney 11/08/2012  . Temporary cerebral vascular dysfunction 11/08/2012  . Tobacco abuse, in remission 11/08/2012  . 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 Abnormal presence of protein in urine 09/21/2011  . ADPKD (autosomal dominant polycystic kidney disease) 06/07/2011    Past Surgical History:  Procedure Laterality Date  . ANEURYSM COILING    . AV FISTULA PLACEMENT Left 07/20/2016   Procedure: ARTERIOVENOUS (AV) FISTULA CREATION;  Surgeon: Renford DillsGregory G Schnier, MD;  Location: [facility] ORS;  Service: Vascular;  Laterality: Left;  . AV FISTULA PLACEMENT Left 08/26/2016   Procedure: INSERTION OF ARTERIOVENOUS (AV) GORE-TEX GRAFT ARM;  Surgeon: Renford DillsGregory G Schnier, MD;  Location: [facility] ORS;  Service: Vascular;  Laterality: Left;  . COLONOSCOPY     2013  . DIALYSIS/PERMA CATHETER INSERTION N/A 06/22/2017   Procedure: DIALYSIS/PERMA CATHETER INSERTION;  Surgeon: Annice Needyew, Jason S, MD;  Location: [facility] INVASIVE CV LAB;  Service: Cardiovascular;  Laterality: N/A;  . DIALYSIS/PERMA CATHETER INSERTION N/A 06/23/2017   Procedure: DIALYSIS/PERMA CATHETER INSERTION;  Surgeon: Annice Needyew, Jason S, MD;  Location: [facility] INVASIVE CV LAB;  Service: Cardiovascular;  Laterality: N/A;  . DIALYSIS/PERMA CATHETER REMOVAL N/A 10/11/2016   Procedure: Dialysis/Perma Catheter Removal;  Surgeon: Renford DillsGregory G Schnier, MD;  Location: [facility] INVASIVE CV LAB;  Service: Cardiovascular;  Laterality: N/A;  . INSERT / REPLACE / REMOVE PACEMAKER    . IR RADIOLOGIST EVAL & MGMT  05/18/2017  . PACEMAKER PLACEMENT    . PERIPHERAL VASCULAR CATHETERIZATION N/A 06/02/2016   Procedure: Dialysis/Perma Catheter  Insertion;  Surgeon: Annice Needy, MD;  Location: [facility] INVASIVE CV LAB;  Service: Cardiovascular;  Laterality: N/A;    Allergies Hydralazine hcl; Iodine; and Ivp dye [iodinated diagnostic agents]  Social History Social History   Tobacco Use  . Smoking status: Current Every Day Smoker    Packs/day: 0.25    Years: 28.00     Pack years: 7.00    Types: Cigarettes  . Smokeless tobacco: Never Used  . Tobacco comment: pt lives with people who smoke in house  Substance Use Topics  . Alcohol use: No  . Drug use: No    Review of Systems Constitutional: Negative for fever. Cardiovascular: Negative for chest pain. Respiratory: Negative for shortness of breath. Gastrointestinal: Negative for abdominal pain, positive for vomiting Genitourinary: Negative for dysuria. Musculoskeletal: Negative for back pain. Skin: Negative for rash. Neurological: Negative for headaches, positive for generalized weakness  All systems negative/normal/unremarkable except as stated in the HPI  ____________________________________________   PHYSICAL EXAM:  VITAL SIGNS: ED Triage Vitals  Enc Vitals Group     BP 07/11/17 1332 115/66     Pulse Rate 07/11/17 1332 62     Resp 07/11/17 1332 20     Temp 07/11/17 1332 97.8 F (36.6 C)     Temp Source 07/11/17 1332 Oral     SpO2 07/11/17 1332 100 %     Weight 07/11/17 1333 124 lb (56.2 kg)     Height 07/11/17 1333 5\' 6"  (1.676 m)     Head Circumference --      Peak Flow --      Pain Score --      Pain Loc --      Pain Edu? --      Excl. in GC? --     Constitutional: Lethargic but arouses to verbal stimuli.  No distress Eyes: Conjunctivae are normal. Normal extraocular movements. ENT   Head: Normocephalic and atraumatic.   Nose: No congestion/rhinnorhea.   Mouth/Throat: Mucous membranes are moist.   Neck: No stridor. Cardiovascular: Normal rate, regular rhythm.  Murmur auscultated.  PermCath noted to the right chest wall Respiratory: Normal respiratory effort without tachypnea nor retractions. Breath sounds are clear and equal bilaterally. No wheezes/rales/rhonchi. Gastrointestinal: Soft and nontender. Normal bowel sounds Musculoskeletal: Nontender with normal range of motion in extremities. No lower extremity tenderness nor edema.  Left upper extremity dialysis  access is present. Neurologic:  Normal speech and language. No gross focal neurologic deficits are appreciated.  Skin:  Skin is warm, dry and intact. No rash noted. Psychiatric: Mood and affect are normal. Speech and behavior are normal.  ____________________________________________  EKG: Interpreted by me.  Atrial paced rhythm with prolonged AV conduction.  Rate is 63 bpm.  No ST elevation.  ____________________________________________  ED COURSE:  As part of my medical decision making, I reviewed the following data within the electronic MEDICAL RECORD NUMBER History obtained from family if available, nursing notes, old chart and ekg, as well as notes from prior ED visits. Patient presented for weakness and needing dialysis, we will assess with labs and imaging as indicated at this time.   Procedures ____________________________________________   LABS (pertinent positives/negatives)  Labs Reviewed  CBC - Abnormal; Notable for the following components:      Result Value   RDW 17.5 (*)    All other components within normal limits  URINALYSIS, COMPLETE (UACMP) WITH MICROSCOPIC  BASIC METABOLIC PANEL  TROPONIN I  CBG MONITORING, ED  ____________________________________________ CRITICAL CARE Performed by:  Emily Filbert   Total critical care time: 30 minutes  Critical care time was exclusive of separately billable procedures and treating other patients.  Critical care was necessary to treat or prevent imminent or life-threatening deterioration.  Critical care was time spent personally by me on the following activities: development of treatment plan with patient and/or surrogate as well as nursing, discussions with consultants, evaluation of patient's response to treatment, examination of patient, obtaining history from patient or surrogate, ordering and performing treatments and interventions, ordering and review of laboratory studies, ordering and review of radiographic studies,  pulse oximetry and re-evaluation of patient's condition.   DIFFERENTIAL DIAGNOSIS   Electrolyte abnormality, hyperkalemia, end-stage renal disease, volume overload, sepsis  FINAL ASSESSMENT AND PLAN  End-stage renal disease on dialysis   Plan: Patient had presented for weakness after having missed dialysis for the last week. Patient's labs are as expected having missed dialysis for a week.  We did preemptively give her bicarb and calcium.  Have discussed with nephrology who will arrange for emergent dialysis.  Currently vital signs are stable.   Emily Filbert, MD   Note: This note was generated in part or whole with voice recognition software. Voice recognition is usually quite accurate but there are transcription errors that can and very often do occur. I apologize for any typographical errors that were not detected and corrected.     Emily Filbert, MD 07/11/17 [phone]

## 2017-07-11 NOTE — ED Triage Notes (Addendum)
Pt has missed dialysis for 1 week because felt weak and didn't drive self and did not have another ride.  C/o dry heaves as well as generalized weakness X 1 week.  Unlabored currently. Graft to LUE and permcath to right chest wall.  VSS but appears ill. Fell asleep during triage. Has never missed dialysis like this before.

## 2017-07-11 NOTE — Progress Notes (Signed)
Pre hd 

## 2017-07-11 NOTE — Consult Note (Signed)
PHARMACY CONSULT NOTE - INITIAL   Pharmacy Consult for electrolyte Consult   Patient Measurements: Height: 5\' 6"  (167.6 cm) Weight: 124 lb (56.2 kg) IBW/kg (Calculated) : 59.3  Potassium  Date Value Ref Range Status  07/11/2017 6.5 (HH) 3.5 - 5.1 mmol/L Final    Comment:    HEMOLYSIS AT THIS LEVEL MAY AFFECT RESULT CRITICAL RESULT CALLED TO, READ BACK BY AND VERIFIED WITH AMY COYNE RN AT 1615 07/11/17.MSS RESULT CONFIRMED BY MANUAL DILUTION.MSS   01/30/2014 5.0 3.5 - 5.1 mmol/L Final   Calcium  Date Value Ref Range Status  07/11/2017 5.5 (LL) 8.9 - 10.3 mg/dL Final    Comment:    CRITICAL RESULT CALLED TO, READ BACK BY AND VERIFIED WITH AMY COHEN AT 1556 ON 07/11/17 JJB    Calcium, Total  Date Value Ref Range Status  01/30/2014 8.8 8.5 - 10.1 mg/dL Final  ] Albumin  Date Value Ref Range Status  06/14/2017 2.3 (L) 3.5 - 5.0 g/dL Final  16/10/960407/30/2015 3.9 3.4 - 5.0 g/dL Final  ] Estimated Creatinine Clearance: 2 mL/min (A) (by C-G formula based on SCr of 23.76 mg/dL (H)).   Assessment: Pharmacy consulted for electrolyte monitoring and replacement in 67 yo female with ESRD and uremia.   Patient received Calcium Gluconate 1g IV x 1 dose In ED.   Goal of Therapy:  Electrolytes WNL  Plan:  Albumin ordered Patient scheduled for HD today  No replacement needed at this time.   Gardner CandleSheema M Anea Fodera, PharmD, BCPS. Clinical Pharmacist 07/11/2017 4:39 PM

## 2017-07-11 NOTE — ED Notes (Signed)
Reports called to dailysis nurse and 2nd floor nurse for admission.  Pt alert.  Iv in place.

## 2017-07-11 NOTE — Progress Notes (Signed)
HD initiated via R Chest HD catheter without issue. UF goal 1L. Tx time 2.5 hours as ordered. Continue to monitor patient. No heparin.

## 2017-07-11 NOTE — ED Notes (Signed)
Nephrology to bedside at this time.

## 2017-07-11 NOTE — ED Notes (Signed)
[facility]

## 2017-07-11 NOTE — ED Notes (Signed)
Pharmacy notified to send Calcium Gluconate

## 2017-07-11 NOTE — ED Triage Notes (Signed)
FIRST NURSE NOTE-here for vomiting and weakness.  NAD. In wheelchair.

## 2017-07-11 NOTE — Progress Notes (Signed)
HD tx end  

## 2017-07-11 NOTE — Progress Notes (Signed)
Dr. Wynelle LinkKolluru notified of bp drop unrelieved with 250cc saline bolus and UF off x30 minutes. Order placed to give patient 25g albumin and continue to leave UF off. Will continue to monitor.

## 2017-07-11 NOTE — Progress Notes (Signed)
Pt reporting chronic pain generalized, pt only has regular tylenol for pain, pt states regular tylenol doesn't work for her, takes tylenol with codeine at home. MD paged, Dr. Caryn BeeMaier gave orders to D/C regular tylenol, and add tylenol #3 q6hr PRN for pain. Will continue to monitor. Shirley FriarAlexis Miller, RN, BSN

## 2017-07-11 NOTE — H&P (Signed)
[facility] - [place] at [facility]   PATIENT NAME: Melissa Bradshaw    MR#:  [id number]  DATE OF BIRTH:  May 27, 1951  DATE OF ADMISSION:  07/11/2017  PRIMARY CARE PHYSICIAN: Corky Downs, MD   REQUESTING/REFERRING PHYSICIAN: Emily Filbert, MD  CHIEF COMPLAINT:   Generalized weakness and dry heaving HISTORY OF PRESENT ILLNESS:  Melissa Bradshaw  is a 67 y.o. female with a known history of end-stage renal disease on hemodialysis on Tuesday Thursday and Saturday missed her hemodialysis for 1 week Patient reports that she was unable to drive as she was feeling weak and nobody could give her ride.  Patient is reporting feeling tired and drowsy.  Also reports nauseous fell asleep in triage in the ED. patient was given calcium gluconate and sodium bicarbonate in the ED for anticipated hyperkalemia.  Troponin is elevated according to the lab, BMP still pending at this time patient denies any chest pain or shortness of breath.  PAST MEDICAL HISTORY:   Past Medical History:  Diagnosis Date  . Anemia   . Aneurysm (HCC)    Cerebral, [place], [place]  . Arthritis   . Cardiac pacemaker   . CHF (congestive heart failure) (HCC)   . Chronic kidney disease    ESRD  . Constipation   . Coronary artery disease   . Dialysis patient (HCC)    Tues, Thurs, Sat  . Gastric ulcer   . Glaucoma (increased eye pressure)   . Headache   . Hypertension   . Presence of permanent cardiac pacemaker     PAST SURGICAL HISTOIRY:   Past Surgical History:  Procedure Laterality Date  . ANEURYSM COILING    . AV FISTULA PLACEMENT Left 07/20/2016   Procedure: ARTERIOVENOUS (AV) FISTULA CREATION;  Surgeon: Renford Dills, MD;  Location: [facility] ORS;  Service: Vascular;  Laterality: Left;  . AV FISTULA PLACEMENT Left 08/26/2016   Procedure: INSERTION OF ARTERIOVENOUS (AV) GORE-TEX GRAFT ARM;  Surgeon: Renford Dills, MD;  Location: [facility] ORS;  Service: Vascular;  Laterality:  Left;  . COLONOSCOPY     2013  . DIALYSIS/PERMA CATHETER INSERTION N/A 06/22/2017   Procedure: DIALYSIS/PERMA CATHETER INSERTION;  Surgeon: Annice Needy, MD;  Location: [facility] INVASIVE CV LAB;  Service: Cardiovascular;  Laterality: N/A;  . DIALYSIS/PERMA CATHETER INSERTION N/A 06/23/2017   Procedure: DIALYSIS/PERMA CATHETER INSERTION;  Surgeon: Annice Needy, MD;  Location: [facility] INVASIVE CV LAB;  Service: Cardiovascular;  Laterality: N/A;  . DIALYSIS/PERMA CATHETER REMOVAL N/A 10/11/2016   Procedure: Dialysis/Perma Catheter Removal;  Surgeon: Renford Dills, MD;  Location: [facility] INVASIVE CV LAB;  Service: Cardiovascular;  Laterality: N/A;  . INSERT / REPLACE / REMOVE PACEMAKER    . IR RADIOLOGIST EVAL & MGMT  05/18/2017  . PACEMAKER PLACEMENT    . PERIPHERAL VASCULAR CATHETERIZATION N/A 06/02/2016   Procedure: Dialysis/Perma Catheter Insertion;  Surgeon: Annice Needy, MD;  Location: [facility] INVASIVE CV LAB;  Service: Cardiovascular;  Laterality: N/A;    SOCIAL HISTORY:   Social History   Tobacco Use  . Smoking status: Current Every Day Smoker    Packs/day: 0.25    Years: 28.00    Pack years: 7.00    Types: Cigarettes  . Smokeless tobacco: Never Used  . Tobacco comment: pt lives with people who smoke in house  Substance Use Topics  . Alcohol use: No    FAMILY HISTORY:   Family History  Problem Relation Age of Onset  . Hypertension Mother   .  Diabetes Father   . Cancer Sister   . Stroke Sister   . Hypertension Sister     DRUG ALLERGIES:   Allergies  Allergen Reactions  . Hydralazine Hcl Other (See Comments)    Makes pt jittery, nervous, messes with vision   . Iodine Itching and Rash    Unknown reaction  . Ivp Dye [Iodinated Diagnostic Agents] Rash    REVIEW OF SYSTEMS:  CONSTITUTIONAL: No fever.  Reporting generalized weakness and very sleepy but answering questions appropriately EYES: No blurred or double vision.  EARS, NOSE, AND THROAT: No tinnitus or ear pain.   RESPIRATORY: No cough, shortness of breath, wheezing or hemoptysis.  CARDIOVASCULAR: No chest pain, orthopnea, edema.  GASTROINTESTINAL: Reporting persistent nausea, denies vomiting, diarrhea or abdominal pain.  GENITOURINARY: No dysuria, hematuria.  ENDOCRINE: No polyuria, nocturia,  HEMATOLOGY: No anemia, easy bruising or bleeding SKIN: No rash or lesion. MUSCULOSKELETAL: No joint pain or arthritis.   NEUROLOGIC: No tingling, numbness, weakness.  PSYCHIATRY: No anxiety or depression.   MEDICATIONS AT HOME:   Prior to Admission medications   Medication Sig Start Date End Date Taking? Authorizing Provider  amLODipine (NORVASC) 10 MG tablet Take 10 mg by mouth daily.   Yes [provider]  aspirin 81 MG tablet Take 81 mg by mouth daily.   Yes [provider]  calcium acetate (PHOSLO) 667 MG capsule Take 1,334 mg by mouth 3 (three) times daily with meals.    Yes [provider]  docusate sodium (COLACE) 100 MG capsule Take 1 capsule (100 mg total) by mouth 2 (two) times daily. Patient taking differently: Take 100 mg by mouth every other day.  06/03/17  Yes Enid Baas, MD  furosemide (LASIX) 20 MG tablet Take 20 mg by mouth daily.  05/17/16  Yes [provider]  latanoprost (XALATAN) 0.005 % ophthalmic solution Place 1 drop into both eyes at bedtime.   Yes [provider]  lidocaine-prilocaine (EMLA) cream Apply 1 application topically as directed.  11/01/16  Yes [provider]  ondansetron (ZOFRAN ODT) 4 MG disintegrating tablet Take 1 tablet (4 mg total) by mouth every 8 (eight) hours as needed for nausea or vomiting. 06/03/17  Yes Enid Baas, MD      VITAL SIGNS:  Blood pressure 137/80, pulse 69, temperature 97.8 F (36.6 C), temperature source Oral, resp. rate (!) 23, height 5\' 6"  (1.676 m), weight 56.2 kg (124 lb), SpO2 100 %.  PHYSICAL EXAMINATION:  GENERAL:  67 y.o.-year-old patient lying in the bed with no acute  distress.  EYES: Pupils equal, round, reactive to light and accommodation. No scleral icterus. Extraocular muscles intact.  HEENT: Head atraumatic, normocephalic. Oropharynx and nasopharynx clear.  NECK:  Supple, no jugular venous distention. No thyroid enlargement, no tenderness.  LUNGS: Normal breath sounds bilaterally, no wheezing, rales,rhonchi or crepitation. No use of accessory muscles of respiration.  CARDIOVASCULAR: S1, S2 normal. No murmurs, rubs, or gallops.  ABDOMEN: Soft, nontender, nondistended. Bowel sounds present. No organomegaly or mass.  EXTREMITIES: No pedal edema, cyanosis, or clubbing.  NEUROLOGIC: Cranial nerves II through XII are intact. Muscle strength 5/5 in all extremities. Sensation intact. Gait not checked.  PSYCHIATRIC: The patient is alert and oriented x 3.  SKIN: No obvious rash, lesion, or ulcer.   LABORATORY PANEL:   CBC Recent Labs  Lab 07/11/17 1348  WBC 7.1  HGB 15.0  HCT 46.5  PLT 298   ------------------------------------------------------------------------------------------------------------------  Chemistries  No results for input(s): NA, K, CL,  CO2, GLUCOSE, BUN, CREATININE, CALCIUM, MG, AST, ALT, ALKPHOS, BILITOT in the last 168 hours.  Invalid input(s): GFRCGP ------------------------------------------------------------------------------------------------------------------  Cardiac Enzymes No results for input(s): TROPONINI in the last 168 hours. ------------------------------------------------------------------------------------------------------------------  RADIOLOGY:  Dg Chest Port 1 View  Result Date: 07/11/2017 CLINICAL DATA:  Weakness, shortness of Breath EXAM: PORTABLE CHEST 1 VIEW COMPARISON:  06/21/2017 FINDINGS: Interval placement of right dialysis catheter with the tip in the right atrium. Left pacer is unchanged. Heart is normal size. Lungs are clear. No effusions or acute bony abnormality. IMPRESSION: No active disease.  Electronically Signed   By: Charlett NoseKevin  Dover M.D.   On: 07/11/2017 15:54    EKG:   Orders placed or performed during the hospital encounter of 07/11/17  . ED EKG  . ED EKG    IMPRESSION AND PLAN:   Melissa Bradshaw  is a 67 y.o. female with a known history of end-stage renal disease on hemodialysis on Tuesday Thursday and Saturday missed her hemodialysis for 1 week Patient reports that she was unable to drive as she was feeling weak and nobody could give her ride.  Patient is reporting feeling tired and drowsy.  Also reports nauseous  # ESRD with clinical uremia To telemetry Patient missed her hemodialysis for 1 week and last hemodialysis was 06/26/2017 Patient is for emergent hemodialysis now, discussed with nephrologist BMP and uric acid levels are pending at this time  patient has received 1 dose of sodium bicarb and calcium gluconate in the ED by the ED physician   #Essential hypertension Blood pressure is OK at this time  Hold home medication amlodipine, furosemide and Labetalol  #Generalized weakness with nausea and dry heaving Symptoms are from missing hemodialysis for 1 week Patient is for emergent hemodialysis  #Elevated troponin Patient denies any chest pain or shortness of breath Patient is a dialysis patient and asymptomatic.  Continue close monitoring and telemetry and monitor troponins  All the records are reviewed and case discussed with ED provider. Management plans discussed with the patient, family and they are in agreement.  CODE STATUS: fc,daughter is HCPOA  TOTAL CRITICAL CARE TIME TAKING CARE OF THIS PATIENT: 45  minutes.   Note: This dictation was prepared with Dragon dictation along with smaller phrase technology. Any transcriptional errors that result from this process are unintentional.  Ramonita LabGouru, Saphronia Ozdemir M.D on 07/11/2017 at 4:02 PM  Between 7am to 6pm - Pager - [phone]  After 6pm go to [email or web address] - password [email or web address] [facility]  [place]  [facility]  Office  [phone]  CC: Primary care physician; Corky DownsMasoud, Javed, MD

## 2017-07-12 DIAGNOSIS — R0602 Shortness of breath: Secondary | ICD-10-CM

## 2017-07-12 DIAGNOSIS — R531 Weakness: Secondary | ICD-10-CM

## 2017-07-12 DIAGNOSIS — N186 End stage renal disease: Secondary | ICD-10-CM

## 2017-07-12 DIAGNOSIS — Z7189 Other specified counseling: Secondary | ICD-10-CM

## 2017-07-12 DIAGNOSIS — Z515 Encounter for palliative care: Secondary | ICD-10-CM

## 2017-07-12 LAB — COMPREHENSIVE METABOLIC PANEL
ALK PHOS: 57 U/L (ref 38–126)
ALT: 9 U/L — AB (ref 14–54)
AST: 20 U/L (ref 15–41)
Albumin: 2.4 g/dL — ABNORMAL LOW (ref 3.5–5.0)
Anion gap: 26 — ABNORMAL HIGH (ref 5–15)
BUN: 51 mg/dL — AB (ref 6–20)
CALCIUM: 6.9 mg/dL — AB (ref 8.9–10.3)
CO2: 17 mmol/L — AB (ref 22–32)
CREATININE: 13.8 mg/dL — AB (ref 0.44–1.00)
Chloride: 97 mmol/L — ABNORMAL LOW (ref 101–111)
GFR, EST AFRICAN AMERICAN: 3 mL/min — AB (ref >60)
GFR, EST NON AFRICAN AMERICAN: 2 mL/min — AB (ref >60)
Glucose, Bld: 54 mg/dL — ABNORMAL LOW (ref 65–99)
Potassium: 5.5 mmol/L — ABNORMAL HIGH (ref 3.5–5.1)
Sodium: 140 mmol/L (ref 135–145)
Total Bilirubin: 1.9 mg/dL — ABNORMAL HIGH (ref 0.3–1.2)
Total Protein: 5.3 g/dL — ABNORMAL LOW (ref 6.5–8.1)

## 2017-07-12 LAB — CBC
HCT: 35.7 % (ref 35.0–47.0)
Hemoglobin: 12 g/dL (ref 12.0–16.0)
MCH: 29.6 pg (ref 26.0–34.0)
MCHC: 33.5 g/dL (ref 32.0–36.0)
MCV: 88.3 fL (ref 80.0–100.0)
PLATELETS: 200 10*3/uL (ref 150–440)
RBC: 4.05 MIL/uL (ref 3.80–5.20)
RDW: 16.8 % — AB (ref 11.5–14.5)
WBC: 4.4 10*3/uL (ref 3.6–11.0)

## 2017-07-12 LAB — PHOSPHORUS: PHOSPHORUS: 8.6 mg/dL — AB (ref 2.5–4.6)

## 2017-07-12 LAB — HEPATITIS B SURFACE ANTIGEN: Hepatitis B Surface Ag: NEGATIVE

## 2017-07-12 MED ORDER — RENA-VITE PO TABS
1.0000 | ORAL_TABLET | Freq: Every day | ORAL | Status: DC
  Administered 2017-07-12 – 2017-07-17 (×6): 1 via ORAL
  Filled 2017-07-12 (×7): qty 1

## 2017-07-12 MED ORDER — LATANOPROST 0.005 % OP SOLN
1.0000 [drp] | Freq: Every day | OPHTHALMIC | Status: DC
Start: 2017-07-12 — End: 2017-07-18
  Administered 2017-07-12 – 2017-07-17 (×6): 1 [drp] via OPHTHALMIC
  Filled 2017-07-12: qty 2.5

## 2017-07-12 MED ORDER — ASPIRIN 81 MG PO CHEW
81.0000 mg | CHEWABLE_TABLET | Freq: Every day | ORAL | Status: DC
  Administered 2017-07-12 – 2017-07-18 (×7): 81 mg via ORAL
  Filled 2017-07-12 (×7): qty 1

## 2017-07-12 MED ORDER — DOCUSATE SODIUM 100 MG PO CAPS
100.0000 mg | ORAL_CAPSULE | Freq: Two times a day (BID) | ORAL | Status: DC
  Administered 2017-07-14 – 2017-07-15 (×2): 100 mg via ORAL
  Filled 2017-07-12 (×7): qty 1

## 2017-07-12 MED ORDER — ALBUMIN HUMAN 25 % IV SOLN
25.0000 g | Freq: Once | INTRAVENOUS | Status: DC
  Filled 2017-07-12: qty 100

## 2017-07-12 MED ORDER — LIDOCAINE-PRILOCAINE 2.5-2.5 % EX CREA: 1.0000 "application " | TOPICAL_CREAM | CUTANEOUS | Status: DC

## 2017-07-12 MED ORDER — NEPRO/CARBSTEADY PO LIQD
237.0000 mL | Freq: Two times a day (BID) | ORAL | Status: DC
  Administered 2017-07-13 – 2017-07-15 (×3): 237 mL via ORAL

## 2017-07-12 MED ORDER — CALCIUM GLUCONATE 10 % IV SOLN
1.0000 g | Freq: Once | INTRAVENOUS | Status: DC
  Filled 2017-07-12: qty 10

## 2017-07-12 MED ORDER — CALCIUM ACETATE (PHOS BINDER) 667 MG PO CAPS
1334.0000 mg | ORAL_CAPSULE | Freq: Three times a day (TID) | ORAL | Status: DC
  Administered 2017-07-13 – 2017-07-18 (×15): 1334 mg via ORAL
  Filled 2017-07-12 (×15): qty 2

## 2017-07-12 NOTE — Progress Notes (Signed)
Pre HD assessment  

## 2017-07-12 NOTE — Progress Notes (Signed)
HD tx end  

## 2017-07-12 NOTE — Progress Notes (Signed)
Post HD assessment  

## 2017-07-12 NOTE — Consult Note (Signed)
PHARMACY CONSULT NOTE - INITIAL   Pharmacy Consult for electrolyte Consult   Patient Measurements: Height: 5\' 6"  (167.6 cm) Weight: 101 lb 14.4 oz (46.2 kg) IBW/kg (Calculated) : 59.3  Potassium  Date Value Ref Range Status  07/12/2017 5.5 (H) 3.5 - 5.1 mmol/L Final  01/30/2014 5.0 3.5 - 5.1 mmol/L Final   Calcium  Date Value Ref Range Status  07/12/2017 6.9 (L) 8.9 - 10.3 mg/dL Final   Calcium, Total  Date Value Ref Range Status  01/30/2014 8.8 8.5 - 10.1 mg/dL Final  ] Albumin  Date Value Ref Range Status  07/12/2017 2.4 (L) 3.5 - 5.0 g/dL Final  16/10/960407/30/2015 3.9 3.4 - 5.0 g/dL Final  ] Estimated Creatinine Clearance: 2.9 mL/min (A) (by C-G formula based on SCr of 13.8 mg/dL (H)).   Assessment: Pharmacy consulted for electrolyte monitoring and replacement in 67 yo female with ESRD and uremia.   Patient has a corrected calcium of 8.2mg /dl, hyperkalemia of 5.4UJWJ/X5.5mmol/L.  Goal of Therapy:   Electrolytes WNL   Plan:  Calcium gluconate 1gm iv x 1  Will discuss need for albumin with Dr Imogene Burnhen upon rounding, no pharmacy protocol to replace this.  AM electrolytes with Mg.Pharmacy to continue to monitor and manage.    Luan PullingGarrett Hanna Ra, PharmD, MBA, BCGP Clinical Pharmacist [facility]    07/12/2017 8:37 AM

## 2017-07-12 NOTE — Progress Notes (Signed)
HD tx start 

## 2017-07-12 NOTE — Care Management Note (Signed)
Case Management Note  Patient Details  Name: Melissa Bradshaw MRN: [id number] Date of Birth: 03/24/1951  Subjective/Objective:                 Patient admitted with uremia sx due to missing her dialysis treatments for at least one week.  It is reported that she did not feel well enough to drive herself to her appointments and no other transportation.    Action/Plan: Notified  Dimas ChyleAmanda Morris with [facility] [employer] of admission. Requested clinic social referral for transportation concerns when patient does not feel like driving  Expected Discharge Date:                  Expected Discharge Plan:     In-House Referral:     Discharge planning Services     Post Acute Care Choice:    Choice offered to:     DME Arranged:    DME Agency:     HH Arranged:    HH Agency:     Status of Service:     If discussed at [employer] Meetings, dates discussed:    Additional Comments:  Eber HongGreene, Aamari Strawderman R, RN 07/12/2017, 9:27 AM

## 2017-07-12 NOTE — Consult Note (Signed)
Consultation Note Date: 07/12/2017   Patient Name: Melissa Bradshaw  DOB: May 18, 1951  MRN: [id number]  Age / Sex: 67 y.o., female  PCP: Corky Downs, MD Referring Physician: Shaune Pollack, MD  Reason for Consultation: Establishing goals of care  HPI/Patient Profile: Melissa Bradshaw  is a 67 y.o. female with a known history of end-stage renal disease on hemodialysis on Tuesday Thursday and Saturday missed her hemodialysis for 1 week.    Clinical Assessment and Goals of Care: Melissa Bradshaw is resting in bed. 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 She states her daughters pretend to care but they really don't. She states she has not been going to dialysis because of feeling sick. She states she has pain when the fluid moves in her legs.   We discussed her diagnosis, prognosis, GOC, EOL wishes disposition and options.  Nephrology in to speak with patient. She discussed her discomfort and feeling of sickness. It was explained that over-diayzing and missing dialysis both can cause nausea. Plans for change with dialysis time. She states she wants to continue dialysis as long as she possibly can.   A detailed discussion was had today regarding advanced directives.  Concepts specific to code status, artifical feeding and hydration,  IV antibiotics and rehospitalization was had.  The difference between an aggressive medical intervention path and a hospice comfort care path for this patient at this time was discussed.  Values and goals of care important to patient were discussed.   She states she has a new great grandchild born Dec 28th she wants to live for. She states it is up to the Lord when she leaves this earth, but she has so much to live for. 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   MOST form completed. She would not want CPR, she would not want to be intubated or  placed on a ventilator (section B with limited additional interventions) . She would never want a feeding tube. She is okay with current treatment.   Following conversation, she states her daughter will be coming who lives out of town and would like her updated.     SUMMARY OF RECOMMENDATIONS   DNR/DNI. Changes to be made to dialysis to help symptoms.    Code Status/Advance Care Planning:  DNR    Symptom Management:   Agree with Zofran for nausea.   Palliative Prophylaxis:   Oral Care   Prognosis:   Unable to determine Depending on dialysis.   Discharge Planning: To Be Determined      Primary Diagnoses: Present on Admission: . Fluid overload   I have reviewed the medical record, interviewed the patient and family, and examined the patient. The following aspects are pertinent.  Past Medical History:  Diagnosis Date  . Anemia   . Aneurysm (HCC)    Cerebral, [place]  . Arthritis   . Cardiac pacemaker   . CHF (congestive heart failure) (HCC)   . Chronic Bradshaw disease  ESRD  . Constipation   . Coronary artery disease   . Dialysis patient (HCC)    Tues, Thurs, Sat  . Gastric ulcer   . Glaucoma (increased eye pressure)   . Headache   . Hypertension   . Presence of permanent cardiac pacemaker    Social History   Socioeconomic History  . Marital status: Single    Spouse name: None  . Number of children: None  . Years of education: None  . Highest education level: None  Social Needs  . Financial resource strain: None  . Food insecurity - worry: None  . Food insecurity - inability: None  . Transportation needs - medical: None  . Transportation needs - non-medical: None  Occupational History  . None  Tobacco Use  . Smoking status: Current Every Day Smoker    Packs/day: 0.25    Years: 28.00    Pack years: 7.00    Types: Cigarettes  . Smokeless tobacco: Never Used  . Tobacco comment: pt lives with people who smoke in house    Substance and Sexual Activity  . Alcohol use: No  . Drug use: No  . Sexual activity: None  Other Topics Concern  . None  Social History Narrative  . None   Family History  Problem Relation Age of Onset  . Hypertension Mother   . Diabetes Father   . Cancer Sister   . Stroke Sister   . Hypertension Sister    Scheduled Meds: . heparin injection (subcutaneous)  5,000 Units Subcutaneous Q8H   Continuous Infusions: PRN Meds:.acetaminophen-codeine, ondansetron **OR** ondansetron (ZOFRAN) IV, polyethylene glycol Medications Prior to Admission:  Prior to Admission medications   Medication Sig Start Date End Date Taking? Authorizing Provider  amLODipine (NORVASC) 10 MG tablet Take 10 mg by mouth daily.   Yes [provider]  aspirin 81 MG tablet Take 81 mg by mouth daily.   Yes [provider]  calcium acetate (PHOSLO) 667 MG capsule Take 1,334 mg by mouth 3 (three) times daily with meals.    Yes [provider]  docusate sodium (COLACE) 100 MG capsule Take 1 capsule (100 mg total) by mouth 2 (two) times daily. Patient taking differently: Take 100 mg by mouth every other day.  06/03/17  Yes Enid BaasKalisetti, Radhika, MD  furosemide (LASIX) 20 MG tablet Take 20 mg by mouth daily.  05/17/16  Yes [provider]  latanoprost (XALATAN) 0.005 % ophthalmic solution Place 1 drop into both eyes at bedtime.   Yes [provider]  lidocaine-prilocaine (EMLA) cream Apply 1 application topically as directed.  11/01/16  Yes [provider]  ondansetron (ZOFRAN ODT) 4 MG disintegrating tablet Take 1 tablet (4 mg total) by mouth every 8 (eight) hours as needed for nausea or vomiting. 06/03/17  Yes Enid BaasKalisetti, Radhika, MD   Allergies  Allergen Reactions  . Hydralazine Hcl Other (See Comments)    Makes pt jittery, nervous, messes with vision   . Iodine Itching and Rash    Unknown reaction  . Ivp Dye [Iodinated Diagnostic Agents] Rash   Review of Systems   Gastrointestinal: Positive for abdominal distention and abdominal pain.    Physical Exam  Constitutional: No distress.  HENT:  Head: Normocephalic.  Pulmonary/Chest: Effort normal.  Neurological: She is alert.  Oriented  Skin: Skin is warm and dry.    Vital Signs: BP 95/68 (BP Location: Left Arm)   Pulse 96   Temp 98 F (36.7 C)   Resp 18  Ht 5\' 6"  (1.676 m)   Wt 46.2 kg (101 lb 14.4 oz)   SpO2 100%   BMI 16.45 kg/m  Pain Assessment: 0-10 POSS *See Group Information*: 1-Acceptable,Awake and alert Pain Score: 8    SpO2: SpO2: 100 % O2 Device:SpO2: 100 % O2 Flow Rate: .   IO: Intake/output summary:   Intake/Output Summary (Last 24 hours) at 07/12/2017 1151 Last data filed at 07/11/2017 1938 Gross per 24 hour  Intake -  Output -563 ml  Net 563 ml    LBM: Last BM Date: 07/10/17 Baseline Weight: Weight: 56.2 kg (124 lb) Most recent weight: Weight: 46.2 kg (101 lb 14.4 oz)     Palliative Assessment/Data: 50%     Time In: 10:50 Time Out: 12:00 Time Total: 70 min Greater than 50%  of this time was spent counseling and coordinating care related to the above assessment and plan.  Signed by: Morton Stall, NP   Please contact Palliative Medicine Team phone at [phone] for questions and concerns.  For individual provider: See Loretha Stapler

## 2017-07-12 NOTE — Progress Notes (Signed)
[facility]  ROUNDING NOTE   Subjective:   Emergent hemodialysis last night. Hypotensive during treatment. Required IV albumin.  No UF.   Patient met with Palliative care this morning. Complains that feeling sick and nausea are preventing dialysis. Also she does not feel good post treatment.   Objective:  Vital signs in last 24 hours:  Temp:  [97.6 F (36.4 C)-98 F (36.7 C)] 98 F (36.7 C) (01/09 0845) Pulse Rate:  [60-111] 96 (01/09 0845) Resp:  [11-23] 18 (01/09 0845) BP: (70-151)/(42-107) 95/68 (01/09 0845) SpO2:  [96 %-100 %] 100 % (01/09 0845) Weight:  [46.2 kg (101 lb 14.4 oz)-56.2 kg (124 lb)] 46.2 kg (101 lb 14.4 oz) (01/09 0343)  Weight change:  Filed Weights   07/11/17 1333 07/12/17 0343  Weight: 56.2 kg (124 lb) 46.2 kg (101 lb 14.4 oz)    Intake/Output: I/O last 3 completed shifts: In: -  Out: -563    Intake/Output this shift:  No intake/output data recorded.  Physical Exam: General: NAD, laying in bed  Head: Normocephalic, atraumatic. Moist oral mucosal membranes  Eyes: Anicteric, PERRL  Neck: Supple, trachea midline  Lungs:  Clear to auscultation  Heart: Regular rate and rhythm  Abdomen:  Soft, +umbilical hernia  Extremities:  no peripheral edema.  Neurologic: Nonfocal, moving all four extremities  Skin: No lesions  Access: Left AVG no bruit or thrill, RIJ permcath 12/20 Dr. Lucky Cowboy    Basic Metabolic Panel: Recent Labs  Lab 07/11/17 1515 07/12/17 0428  NA 159* 140  K 6.5* 5.5*  CL 96* 97*  CO2 32 17*  GLUCOSE 68 54*  BUN 104* 51*  CREATININE 23.76* 13.80*  CALCIUM 5.5* 6.9*    Liver Function Tests: Recent Labs  Lab 07/11/17 1515 07/12/17 0428  AST  --  20  ALT  --  9*  ALKPHOS  --  57  BILITOT  --  1.9*  PROT  --  5.3*  ALBUMIN 2.2* 2.4*   No results for input(s): LIPASE, AMYLASE in the last 168 hours. No results for input(s): AMMONIA in the last 168 hours.  CBC: Recent Labs  Lab 07/11/17 1348 07/12/17 0428   WBC 7.1 4.4  HGB 15.0 12.0  HCT 46.5 35.7  MCV 90.3 88.3  PLT 298 200    Cardiac Enzymes: Recent Labs  Lab 07/11/17 1515  TROPONINI 0.16*    BNP: Invalid input(s): POCBNP  CBG: No results for input(s): GLUCAP in the last 168 hours.  Microbiology: Results for orders placed or performed during the hospital encounter of 07/11/17  MRSA PCR Screening     Status: None   Collection Time: 07/11/17  8:25 PM  Result Value Ref Range Status   MRSA by PCR NEGATIVE NEGATIVE Final    Comment:        The GeneXpert MRSA Assay (FDA approved for NASAL specimens only), is one component of a comprehensive MRSA colonization surveillance program. It is not intended to diagnose MRSA infection nor to guide or monitor treatment for MRSA infections. Performed at [facility], [address] [place], [place] [address]     Coagulation Studies: No results for input(s): LABPROT, INR in the last 72 hours.  Urinalysis: No results for input(s): COLORURINE, LABSPEC, PHURINE, GLUCOSEU, HGBUR, BILIRUBINUR, KETONESUR, PROTEINUR, UROBILINOGEN, NITRITE, LEUKOCYTESUR in the last 72 hours.  Invalid input(s): APPERANCEUR    Imaging: Dg Chest Port 1 View  Result Date: 07/11/2017 CLINICAL DATA:  Weakness, shortness of Breath EXAM: PORTABLE CHEST 1 VIEW COMPARISON:  06/21/2017 FINDINGS: Interval placement of right dialysis catheter with the tip in the right atrium. Left pacer is unchanged. Heart is normal size. Lungs are clear. No effusions or acute bony abnormality. IMPRESSION: No active disease. Electronically Signed   By: Rolm Baptise M.D.   On: 07/11/2017 15:54     Medications:    . heparin injection (subcutaneous)  5,000 Units Subcutaneous Q8H     Assessment/ Plan:  Ms. DYNA FIGUEREO is a 67 y.o. black female with end stage renal disease on hemodialysis, anemia, hypertension, polycystic kidney disease, history of pacemaker, history of TIA, vertebral artery aneurysm status post  coiling  MWF [place]. Left AVG EDW 54.5kg  1. End-stage renal disease: with hyperkalemia, metabolic acidosis and uremic on admission.  Emergent hemodialysis last night.  Resuming dialysis. Second hemodialysis treatment for tomorrow. May consider decreasing outpatient time for dialysis  2. Hypertension: blood pressure at goal. Not currently taking any blood pressure agents.  - history of difficult to control. Home regimen of amlodipine, furosemide, and labetolol.  - Continue to hold medications.   3. Anemia chronic kidney disease:  Hemoglobin 12- normal range.  - holding EPO  4. Secondary hyperparathyroidism: with hyperphosphatemia and hypocalcemia. Holding cinacalcet. Corrected calcium at goal, 8.2  - calcium acetate with meals.  - Check phosphorus today.     LOS: [place], [place] 1/9/201912:35 PM

## 2017-07-12 NOTE — Progress Notes (Signed)
[facility] [place] [facility]   PATIENT NAME: Melissa PortelaCaroline Tofte    MR#:  [id number]  DATE OF BIRTH:  05/25/1951  SUBJECTIVE:  CHIEF COMPLAINT:   Chief Complaint  Patient presents with  . Weakness   Generalized weakness. REVIEW OF SYSTEMS:  Review of Systems  Constitutional: Positive for malaise/fatigue. Negative for chills and fever.  HENT: Negative for sore throat.   Eyes: Negative for blurred vision and double vision.  Respiratory: Negative for cough, hemoptysis, shortness of breath, wheezing and stridor.   Cardiovascular: Negative for chest pain, palpitations, orthopnea and leg swelling.  Gastrointestinal: Negative for abdominal pain, blood in stool, constipation, diarrhea, melena, nausea and vomiting.  Genitourinary: Negative for dysuria, flank pain and hematuria.  Musculoskeletal: Negative for back pain and joint pain.  Skin: Negative for rash.  Neurological: Positive for weakness. Negative for dizziness, sensory change, focal weakness, seizures, loss of consciousness and headaches.  Endo/Heme/Allergies: Negative for polydipsia.  Psychiatric/Behavioral: Negative for depression. The patient is not nervous/anxious.     DRUG ALLERGIES:   Allergies  Allergen Reactions  . Hydralazine Hcl Other (See Comments)    Makes pt jittery, nervous, messes with vision   . Iodine Itching and Rash    Unknown reaction  . Ivp Dye [Iodinated Diagnostic Agents] Rash   VITALS:  Blood pressure 95/68, pulse 96, temperature 98 F (36.7 C), resp. rate 18, height 5\' 6"  (1.676 m), weight 101 lb 14.4 oz (46.2 kg), SpO2 100 %. PHYSICAL EXAMINATION:  Physical Exam  Constitutional: She is oriented to person, place, and time.  Severe malnutrition.  HENT:  Head: Normocephalic.  Mouth/Throat: Oropharynx is clear and moist.  Eyes: Conjunctivae and EOM are normal. Pupils are equal, round, and reactive to light. No scleral icterus.  Neck: Normal range of motion. Neck supple. No  JVD present. No tracheal deviation present.  Cardiovascular: Normal rate, regular rhythm and normal heart sounds. Exam reveals no gallop.  No murmur heard. Pulmonary/Chest: Effort normal. No respiratory distress. She has no wheezes. She has rales.  Abdominal: Soft. Bowel sounds are normal. She exhibits no distension. There is no tenderness. There is no rebound.  Musculoskeletal: Normal range of motion. She exhibits no edema or tenderness.  Neurological: She is alert and oriented to person, place, and time. No cranial nerve deficit.  Skin: No rash noted. No erythema.  Psychiatric: Affect normal.   LABORATORY PANEL:  Female CBC Recent Labs  Lab 07/12/17 0428  WBC 4.4  HGB 12.0  HCT 35.7  PLT 200   ------------------------------------------------------------------------------------------------------------------ Chemistries  Recent Labs  Lab 07/12/17 0428  NA 140  K 5.5*  CL 97*  CO2 17*  GLUCOSE 54*  BUN 51*  CREATININE 13.80*  CALCIUM 6.9*  AST 20  ALT 9*  ALKPHOS 57  BILITOT 1.9*   RADIOLOGY:  Dg Chest Port 1 View  Result Date: 07/11/2017 CLINICAL DATA:  Weakness, shortness of Breath EXAM: PORTABLE CHEST 1 VIEW COMPARISON:  06/21/2017 FINDINGS: Interval placement of right dialysis catheter with the tip in the right atrium. Left pacer is unchanged. Heart is normal size. Lungs are clear. No effusions or acute bony abnormality. IMPRESSION: No active disease. Electronically Signed   By: Charlett NoseKevin  Dover M.D.   On: 07/11/2017 15:54   ASSESSMENT AND PLAN:   Melissa PortelaCaroline Vining  is a 67 y.o. female with a known history of end-stage renal disease on hemodialysis on Tuesday Thursday and Saturday missed her hemodialysis for 1 week Patient reports that she was unable  to drive as she was feeling weak and nobody could give her ride.  Patient is reporting feeling tired and drowsy.  Also reports nauseous  # ESRD with clinical uremia Patient missed her hemodialysis for 1 week and last  hemodialysis was 06/26/2017 Patient got emergent hemodialysis yesterday and will get hemodialysis today.   Hyperkalemia.  Patient has received 1 dose of sodium bicarb and calcium gluconate in the ED by the ED physician.  Potassium is still high at 5.5. Hemodialysis today and follow-up BMP.  #Essential hypertension Blood pressure is  at low side. Hold home medication amlodipine, furosemide and Labetalol  #Generalized weakness with nausea and dry heaving Symptoms are from missing hemodialysis for 1 week PT evaluation.  #Elevated troponin Possible due to missed hemodialysis.  Severe malnutrition.  Dietitian consult.  Discussed with Dr. Wynelle Link and palliative care nurse practitioner Crystal. All the records are reviewed and case discussed with Care Management/Social Worker. Management plans discussed with the patient, family and they are in agreement.  CODE STATUS: DNR  TOTAL TIME TAKING CARE OF THIS PATIENT: 32 minutes.   More than 50% of the time was spent in counseling/coordination of care: YES  POSSIBLE D/C IN 2 DAYS, DEPENDING ON CLINICAL CONDITION.   Shaune Pollack M.D on 07/12/2017 at 1:23 PM  Between 7am to 6pm - Pager - [phone]  After 6pm go to [email or web address] - Therapist, nutritional Hospitalists

## 2017-07-13 DIAGNOSIS — R1110 Vomiting, unspecified: Secondary | ICD-10-CM

## 2017-07-13 LAB — GASTROINTESTINAL PANEL BY PCR, STOOL (REPLACES STOOL CULTURE)

## 2017-07-13 LAB — CBC
HCT: 35.6 % (ref 35.0–47.0)
Hemoglobin: 11.9 g/dL — ABNORMAL LOW (ref 12.0–16.0)
MCH: 29.6 pg (ref 26.0–34.0)
MCHC: 33.3 g/dL (ref 32.0–36.0)
MCV: 88.8 fL (ref 80.0–100.0)
PLATELETS: 221 10*3/uL (ref 150–440)
RBC: 4.01 MIL/uL (ref 3.80–5.20)
RDW: 16.6 % — AB (ref 11.5–14.5)
WBC: 4.8 10*3/uL (ref 3.6–11.0)

## 2017-07-13 LAB — C DIFFICILE QUICK SCREEN W PCR REFLEX
C DIFFICILE (CDIFF) TOXIN: NEGATIVE
C Diff antigen: POSITIVE — AB

## 2017-07-13 LAB — RENAL FUNCTION PANEL
Albumin: 2.4 g/dL — ABNORMAL LOW (ref 3.5–5.0)
Anion gap: 22 — ABNORMAL HIGH (ref 5–15)
BUN: 31 mg/dL — ABNORMAL HIGH (ref 6–20)
CALCIUM: 8.3 mg/dL — AB (ref 8.9–10.3)
CO2: 22 mmol/L (ref 22–32)
CREATININE: 9.02 mg/dL — AB (ref 0.44–1.00)
Chloride: 96 mmol/L — ABNORMAL LOW (ref 101–111)
GFR calc non Af Amer: 4 mL/min — ABNORMAL LOW (ref >60)
GFR, EST AFRICAN AMERICAN: 5 mL/min — AB (ref >60)
Glucose, Bld: 92 mg/dL (ref 65–99)
Phosphorus: 7.7 mg/dL — ABNORMAL HIGH (ref 2.5–4.6)
Potassium: 4.6 mmol/L (ref 3.5–5.1)
SODIUM: 140 mmol/L (ref 135–145)

## 2017-07-13 LAB — CLOSTRIDIUM DIFFICILE BY PCR, REFLEXED: CDIFFPCR: POSITIVE — AB

## 2017-07-13 MED ORDER — MIDODRINE HCL 5 MG PO TABS
10.0000 mg | ORAL_TABLET | Freq: Three times a day (TID) | ORAL | Status: DC
  Administered 2017-07-13 – 2017-07-18 (×14): 10 mg via ORAL
  Filled 2017-07-13 (×14): qty 2

## 2017-07-13 MED ORDER — ACETAMINOPHEN 500 MG PO TABS
500.0000 mg | ORAL_TABLET | Freq: Four times a day (QID) | ORAL | Status: DC | PRN
  Administered 2017-07-14: 500 mg via ORAL
  Filled 2017-07-13: qty 1

## 2017-07-13 MED ORDER — VANCOMYCIN 50 MG/ML ORAL SOLUTION
125.0000 mg | Freq: Four times a day (QID) | ORAL | Status: DC
  Administered 2017-07-13 – 2017-07-18 (×19): 125 mg via ORAL
  Filled 2017-07-13 (×22): qty 2.5

## 2017-07-13 MED ORDER — ADULT MULTIVITAMIN W/MINERALS CH
1.0000 | ORAL_TABLET | ORAL | Status: DC
  Administered 2017-07-13 – 2017-07-17 (×2): 1 via ORAL
  Filled 2017-07-13 (×3): qty 1

## 2017-07-13 MED ORDER — VITAMIN C 500 MG PO TABS
500.0000 mg | ORAL_TABLET | Freq: Two times a day (BID) | ORAL | Status: DC
  Filled 2017-07-13 (×2): qty 1

## 2017-07-13 MED ORDER — VITAMIN C 500 MG PO TABS
500.0000 mg | ORAL_TABLET | Freq: Two times a day (BID) | ORAL | Status: DC
  Administered 2017-07-13 – 2017-07-18 (×10): 500 mg via ORAL
  Filled 2017-07-13 (×10): qty 1

## 2017-07-13 MED ORDER — SODIUM CHLORIDE 0.9% FLUSH
3.0000 mL | Freq: Two times a day (BID) | INTRAVENOUS | Status: DC
  Administered 2017-07-13 – 2017-07-18 (×10): 3 mL via INTRAVENOUS

## 2017-07-13 NOTE — Progress Notes (Signed)
Daily Progress Note   Patient Name: Melissa Bradshaw       Date: 07/13/2017 DOB: [birth year]  Age: 67 y.o. MRN#: [id number] Attending Physician: Bertrum Sol, MD Primary Care Physician: Corky Downs, MD Admit Date: 07/11/2017  Reason for Consultation/Follow-up: Establishing goals of care  Subjective: Melissa Bradshaw is resting in bed. Melissa Bradshaw has lunch on bed side table, she feels better, but has not been very hungry. At patient's request from yesterday, GOC conversation reviewed with patient and daughter who is at bedside. She states she would not want a feeding tube even if it became indicated. She is DNR/DNI. She states she would like to continue HD as long as possible.    Length of Stay: 2  Current Medications: Scheduled Meds:  . aspirin  81 mg Oral Daily  . calcium acetate  1,334 mg Oral TID WC  . docusate sodium  100 mg Oral BID  . feeding supplement (NEPRO CARB STEADY)  237 mL Oral BID BM  . heparin injection (subcutaneous)  5,000 Units Subcutaneous Q8H  . latanoprost  1 drop Both Eyes QHS  . lidocaine-prilocaine  1 application Topical UD  . multivitamin  1 tablet Oral QHS  . multivitamin with minerals  1 tablet Oral Once per day on Mon Thu  . vancomycin  125 mg Oral QID  . vitamin C  500 mg Oral BID    Continuous Infusions: . albumin human      PRN Meds: acetaminophen, acetaminophen-codeine, ondansetron **OR** ondansetron (ZOFRAN) IV, polyethylene glycol  Physical Exam  Constitutional: No distress.  HENT:  Head: Normocephalic.  Pulmonary/Chest: Effort normal.  Neurological: She is alert.  Oriented.   Skin: Skin is warm and dry.            Vital Signs: BP (!) 89/60 (BP Location: Right Arm)   Pulse 81   Temp 98.3 F (36.8 C) (Oral)   Resp 17   Ht 5\' 6"  (1.676 m)    Wt 45.5 kg (100 lb 4.8 oz)   SpO2 98%   BMI 16.19 kg/m  SpO2: SpO2: 98 % O2 Device: O2 Device: Not Delivered O2 Flow Rate:    Intake/output summary:   Intake/Output Summary (Last 24 hours) at 07/13/2017 1526 Last data filed at 07/12/2017 1742 Gross per 24 hour  Intake -  Output  14 ml  Net -14 ml   LBM: Last BM Date: 07/13/17 Baseline Weight: Weight: 56.2 kg (124 lb) Most recent weight: Weight: 45.5 kg (100 lb 4.8 oz)       Palliative Assessment/Data: 50%    Flowsheet Rows     Most Recent Value  Intake Tab  Referral Department  Hospitalist  Unit at Time of Referral  Cardiac/Telemetry Unit  Palliative Care Primary Diagnosis  Nephrology  Date Notified  07/11/17  Palliative Care Type  New Palliative care  Reason for referral  Clarify Goals of Care  Date of Admission  07/11/17  Date first seen by Palliative Care  07/12/17  # of days Palliative referral response time  1 Day(s)  # of days IP prior to Palliative referral  0  Clinical Assessment  Psychosocial & Spiritual Assessment  Palliative Care Outcomes      Patient Active Problem List   Diagnosis Date Noted  . ESRD (end stage renal disease) on dialysis (HCC) 06/21/2017  . Abdominal pain, epigastric 06/08/2017  . Hyperkalemia 06/01/2017  . Ascites 03/29/2017  . Polycystic liver disease 09/30/2016  . Pre-transplant evaluation for kidney transplant 09/30/2016  . Complication of vascular access for dialysis 08/15/2016  . Anemia 08/08/2016  . Protein-calorie malnutrition, severe 06/01/2016  . Fluid overload 05/31/2016  . Anemia, chronic renal failure 01/31/2016  . End stage renal disease (HCC) 01/01/2015  . Iron deficiency anemia 11/01/2014  . Essential (primary) hypertension 11/08/2012  . Artificial cardiac pacemaker 11/08/2012  . Congenital cystic disease of kidney 11/08/2012  . Temporary cerebral vascular dysfunction 11/08/2012  . Tobacco abuse, in remission 11/08/2012  . Aneurysm of vertebral artery (HCC)  11/08/2012  . Abnormal presence of protein in urine 09/21/2011  . ADPKD (autosomal dominant polycystic kidney disease) 06/07/2011    Palliative Care Assessment & Plan   Patient Profile: Melissa Bradshaw a67 y.o.femalewith a known history of end-stage renal disease on hemodialysis on Tuesday Thursday and Saturday missed her hemodialysis for 1 week.    Assessment/ Recommendations/Plan:  Continue dialysis.   Recommend palliative to follow outpatient.   Code Status:    Code Status Orders  (From admission, onward)        Start     Ordered   07/12/17 1206  Do not attempt resuscitation (DNR)  Continuous    Question Answer Comment  In the event of cardiac or respiratory ARREST Do not call a "code blue"   In the event of cardiac or respiratory ARREST Do not perform Intubation, CPR, defibrillation or ACLS   In the event of cardiac or respiratory ARREST Use medication by any route, position, wound care, and other measures to relive pain and suffering. May use oxygen, suction and manual treatment of airway obstruction as needed for comfort.   Comments MOST form in chart      07/12/17 1205    Code Status History    Date Active Date Inactive Code Status Order ID Comments User Context   07/11/2017 19:56 07/12/2017 12:05 Full Code [id number]  Ramonita LabGouru, Aruna, MD Inpatient   06/21/2017 22:31 06/24/2017 01:52 Full Code [id number]  Bertrum SolSalary, Montell D, MD Inpatient   06/01/2017 13:07 06/03/2017 20:19 Full Code [id number]  Katha HammingKonidena, Snehalatha, MD Inpatient   05/31/2016 21:01 06/07/2016 18:40 Full Code [id number]  Altamese DillingVachhani, Vaibhavkumar, MD Inpatient       Prognosis:   Unable to determine Depending on oral intake and continued dialysis.  Discharge Planning:  To Be Determined   Thank you for allowing the Palliative Medicine Team  to assist in the care of this patient.   Total Time 35 min Prolonged Time Billed No      Greater than 50%  of this time was spent counseling and coordinating  care related to the above assessment and plan.  Morton Stall, NP  Please contact Palliative Medicine Team phone at [phone] for questions and concerns.

## 2017-07-13 NOTE — Evaluation (Signed)
Physical Therapy Evaluation Patient Details Name: Melissa SchroederCaroline J Bradshaw MRN: [id number] DOB: 08/10/1950 Today's Date: 07/13/2017   History of Present Illness  67 y/o female who has been feeling poorly for the last week and did not go to her dialysis, now here with fluid overload.    Clinical Impression  Pt did well with PT exam and was able to fully circumambulate the nurses' station, showed independent and safety with mobility and generally reports that she is near her baseline regarding mobility.  Pt needed ~1 minute of standing rest on initially rising secondary to light headedness, but after that was confident and consistent with prolonged ambulation and generally with mobility, etc.  Pt near baseline and does not want or functionally need further PT intervention.     Follow Up Recommendations No PT follow up    Equipment Recommendations       Recommendations for Other Services       Precautions / Restrictions Precautions Precautions: None Restrictions Weight Bearing Restrictions: No      Mobility  Bed Mobility Overal bed mobility: Independent                Transfers Overall transfer level: Independent Equipment used: None             General transfer comment: Pt able to rise to standing w/o assist, she typically has low BP and had some initial light-headedness, but ultimately was able to maintain standing with ~1 minute of standing rest  Ambulation/Gait Ambulation/Gait assistance: Modified independent (Device/Increase time) Ambulation Distance (Feet): 200 Feet Assistive device: None       General Gait Details: Pt reports that she is walking about at her baseline and feels safe/confident about being able to manage around the home.    Stairs            Wheelchair Mobility    Modified Rankin (Stroke Patients Only)       Balance                                             Pertinent Vitals/Pain Pain Assessment: No/denies pain     Home Living Family/patient expects to be discharged to:: Private residence Living Arrangements: Children Available Help at Discharge: (cares for her grown son, sister occasionally there to help)   Home Access: Stairs to enter   Secretary/administratorntrance Stairs-Number of Steps: 3          Prior Function Level of Independence: Independent         Comments: Pt typically runs her errands, drives herself to dialysis, etc     Hand Dominance        Extremity/Trunk Assessment   Upper Extremity Assessment Upper Extremity Assessment: Overall WFL for tasks assessed;Generalized weakness    Lower Extremity Assessment Lower Extremity Assessment: Overall WFL for tasks assessed;Generalized weakness       Communication   Communication: No difficulties  Cognition Arousal/Alertness: Awake/alert Behavior During Therapy: WFL for tasks assessed/performed Overall Cognitive Status: Within Functional Limits for tasks assessed                                        General Comments      Exercises     Assessment/Plan    PT Assessment Patent does not need any further  PT services  PT Problem List         PT Treatment Interventions      PT Goals (Current goals can be found in the Care Plan section)  Acute Rehab PT Goals Patient Stated Goal: go home, get feeling better PT Goal Formulation: All assessment and education complete, DC therapy    Frequency     Barriers to discharge        Co-evaluation               AM-PAC PT "6 Clicks" Daily Activity  Outcome Measure Difficulty turning over in bed (including adjusting bedclothes, sheets and blankets)?: None Difficulty moving from lying on back to sitting on the side of the bed? : None Difficulty sitting down on and standing up from a chair with arms (e.g., wheelchair, bedside commode, etc,.)?: None Help needed moving to and from a bed to chair (including a wheelchair)?: None Help needed walking in hospital room?:  None Help needed climbing 3-5 steps with a railing? : None 6 Click Score: 24    End of Session Equipment Utilized During Treatment: Gait belt Activity Tolerance: Patient tolerated treatment well Patient left: with call bell/phone within reach;with chair alarm set Nurse Communication: Mobility status PT Visit Diagnosis: Muscle weakness (generalized) (M62.81);Difficulty in walking, not elsewhere classified (R26.2)    Time: 1610-9604 PT Time Calculation (min) (ACUTE ONLY): 27 min   Charges:   PT Evaluation $PT Eval Low Complexity: 1 Low     PT G CodesMalachi Pro, DPT 07/13/2017, 10:42 AM

## 2017-07-13 NOTE — Progress Notes (Signed)
[employer] [facility] [place] [facility]   PATIENT NAME: Melissa Bradshaw    MR#:  [id number]  DATE OF BIRTH:  June 08, 1951  SUBJECTIVE:  CHIEF COMPLAINT:   Chief Complaint  Patient presents with  . Weakness    REVIEW OF SYSTEMS:  CONSTITUTIONAL: No fever, fatigue or weakness.  EYES: No blurred or double vision.  EARS, NOSE, AND THROAT: No tinnitus or ear pain.  RESPIRATORY: No cough, shortness of breath, wheezing or hemoptysis.  CARDIOVASCULAR: No chest pain, orthopnea, edema.  GASTROINTESTINAL: No nausea, vomiting, diarrhea or abdominal pain.  GENITOURINARY: No dysuria, hematuria.  ENDOCRINE: No polyuria, nocturia,  HEMATOLOGY: No anemia, easy bruising or bleeding SKIN: No rash or lesion. MUSCULOSKELETAL: No joint pain or arthritis.   NEUROLOGIC: No tingling, numbness, weakness.  PSYCHIATRY: No anxiety or depression.   ROS  DRUG ALLERGIES:   Allergies  Allergen Reactions  . Hydralazine Hcl Other (See Comments)    Makes pt jittery, nervous, messes with vision   . Iodine Itching and Rash    Unknown reaction  . Ivp Dye [Iodinated Diagnostic Agents] Rash    VITALS:  Blood pressure (!) 93/58, pulse 77, temperature 98.6 F (37 C), temperature source Oral, resp. rate 18, height 5\' 6"  (1.676 m), weight 45.5 kg (100 lb 4.8 oz), SpO2 99 %.  PHYSICAL EXAMINATION:  GENERAL:  67 y.o.-year-old patient lying in the bed with no acute distress.  EYES: Pupils equal, round, reactive to light and accommodation. No scleral icterus. Extraocular muscles intact.  HEENT: Head atraumatic, normocephalic. Oropharynx and nasopharynx clear.  NECK:  Supple, no jugular venous distention. No thyroid enlargement, no tenderness.  LUNGS: Normal breath sounds bilaterally, no wheezing, rales,rhonchi or crepitation. No use of accessory muscles of respiration.  CARDIOVASCULAR: S1, S2 normal. No murmurs, rubs, or gallops.  ABDOMEN: Soft, nontender, nondistended. Bowel sounds present. No  organomegaly or mass.  EXTREMITIES: No pedal edema, cyanosis, or clubbing.  NEUROLOGIC: Cranial nerves II through XII are intact. Muscle strength 5/5 in all extremities. Sensation intact. Gait not checked.  PSYCHIATRIC: The patient is alert and oriented x 3.  SKIN: No obvious rash, lesion, or ulcer.   Physical Exam LABORATORY PANEL:   CBC Recent Labs  Lab 07/13/17 0954  WBC 4.8  HGB 11.9*  HCT 35.6  PLT 221   ------------------------------------------------------------------------------------------------------------------  Chemistries  Recent Labs  Lab 07/12/17 0428 07/13/17 0954  NA 140 140  K 5.5* 4.6  CL 97* 96*  CO2 17* 22  GLUCOSE 54* 92  BUN 51* 31*  CREATININE 13.80* 9.02*  CALCIUM 6.9* 8.3*  AST 20  --   ALT 9*  --   ALKPHOS 57  --   BILITOT 1.9*  --    ------------------------------------------------------------------------------------------------------------------  Cardiac Enzymes Recent Labs  Lab 07/11/17 1515  TROPONINI 0.16*   ------------------------------------------------------------------------------------------------------------------  RADIOLOGY:  No results found.  ASSESSMENT AND PLAN:    [name] a67 y.o.femalewith a known history of end-stage renal disease on hemodialysis on Tuesday Thursday and Saturday missed her hemodialysis for 1 week Patient reports that she was unable to drive as she was feeling weak and nobody could give her ride.Patient is reporting feeling tired and drowsy. Also reports nauseous  1 ESRDwith clinical uremia Patient missed her hemodialysis for 1 week and last hemodialysis was 06/26/2017 Nephrology following for hemodialysis needs-their input is greatly appreciated, for hemodialysis on tomorrow    2 Hyperkalemia Resolved Did receive sodium bicarb and calcium gluconate in the ED as well as hemodialysis  3 Essential  hypertension Continued hypotension noted Antihypertensives on hold  4  persistent hypotension Start Midodrine tid  5 Generalized weakness with nausea and dry heaving Most likely secondary to above   6 acute elevated troponin Most likely secondary to chronic kidney disease   7 Severe malnutrition Dietitian consulted  8 acute C. difficile colitis Acute recurrent C. difficile positive Vancomycin p.o. for 10-14-day course   Condition stable Prognosis poor given noncompliance Discussed with Dr. Wynelle LinkKolluru and palliative care nurse practitioner Crystal   All the records are reviewed and case discussed with Care Management/Social Worker. Management plans discussed with the patient, family and they are in agreement.    All the records are reviewed and case discussed with Care Management/Social Workerr. Management plans discussed with the patient, family and they are in agreement.  CODE STATUS: DNR  TOTAL TIME TAKING CARE OF THIS PATIENT: 40 minutes.     POSSIBLE D/C IN 2-3 DAYS, DEPENDING ON CLINICAL CONDITION.   Melissa Bradshaw M.D on 07/13/2017   Between 7am to 6pm - Pager - [phone]  After 6pm go to [email or web address] - password [email or web address]  [facility]  Office  [phone]  CC: Primary care physician; Corky DownsMasoud, Javed, MD  Note: This dictation was prepared with Dragon dictation along with smaller phrase technology. Any transcriptional errors that result from this process are unintentional.

## 2017-07-13 NOTE — Progress Notes (Signed)
Initial Nutrition Assessment  DOCUMENTATION CODES:   Severe malnutrition in context of chronic illness  INTERVENTION:   Nepro Shake po BID, each supplement provides 425 kcal and 19 grams protein  MVI twice weekly  Rena-vite daily  Magic cup TID with meals, each supplement provides 290 kcal and 9 grams of protein  Vitamin C 500 mg po BID  NUTRITION DIAGNOSIS:   Severe Malnutrition related to catabolic illness(ESRD on HD) as evidenced by severe fat depletion, severe muscle depletion.  GOAL:   Patient will meet greater than or equal to 90% of their needs  MONITOR:   PO intake, Supplement acceptance, Weight trends, Labs, I & O's  REASON FOR ASSESSMENT:   Consult Assessment of nutrition requirement/status  ASSESSMENT:   67 y.o. female with a known history of end-stage renal disease on hemodialysis on Tuesday Thursday and Saturday missed her hemodialysis for 1 week and admitted for uremia    Met with pt in room today. Pt reports poor appetite and oral intake for several months pta. Pt reports that she is unable to keep any food down; she reports that everything she eats makes her vomit. Pt also reports that food tastes metallic. Pt does not drink supplements or take any multivitamins. Per chart, pt appears to have lost 24lbs(20%) over the past 3 weeks; RD is unsure how much weight loss is r/t fluid changes. Pt has severe muscle and fat depletions. Pt also reports that she bruises easily. RD suspects pt with scurvy r/t chronic HD, poor appetite, and pt does not take rena-vite. RD will add supplements, multivitamins, and vitamin C. Pt with elevated phosphorus, on phoslo. Recommend liberalize diet if pt's oral intake does not improve.   Medications reviewed and include: aspirin, phoslo, heparin, MVI, vancomycin  Labs reviewed: Cl 96(L), BUN 31(H), creat 9.02(H), Ca 8.3(L) adj. 9.58 wnl, P 7.7(H), alb 2.4(L) Uric acid- 13.5(H)- 1/8  Nutrition-Focused physical exam completed.  Findings are severe fat and muscle depletions over entire body, and no edema.   Diet Order:  Diet renal with fluid restriction Fluid restriction: 1200 mL Fluid; Room service appropriate? Yes; Fluid consistency: Thin  EDUCATION NEEDS:   Education needs have been addressed  Skin:  Reviewed RN Assessment  Last BM:  1/10- type 7  Height:   Ht Readings from Last 1 Encounters:  07/11/17 5' 6"  (1.676 m)    Weight:   Wt Readings from Last 1 Encounters:  07/13/17 100 lb 4.8 oz (45.5 kg)    Ideal Body Weight:  59 kg  BMI:  Body mass index is 16.19 kg/m.  Estimated Nutritional Needs:   Kcal:  1300-1500kcal/day   Protein:  73-82g/day   Fluid:  >1.3L/day   Koleen Distance MS, RD, LDN Pager #[phone] After Hours Pager: [phone]

## 2017-07-13 NOTE — Care Management (Signed)
CM has made several attempts to speak with patient regarding her dialysis compliance but she has not been avaliable.  will try again tomorrow

## 2017-07-13 NOTE — Progress Notes (Signed)
[facility]  ROUNDING NOTE   Subjective:   Diagnosed with C. Diff +   Hemodialysis yesterday - tolerated treatment. Minimal UF.   Objective:  Vital signs in last 24 hours:  Temp:  [97.1 F (36.2 C)-98.3 F (36.8 C)] 98.3 F (36.8 C) (01/10 0813) Pulse Rate:  [73-115] 81 (01/10 0813) Resp:  [10-23] 17 (01/10 0813) BP: (75-103)/(60-83) 89/60 (01/10 0813) SpO2:  [93 %-100 %] 98 % (01/10 0813) Weight:  [45.5 kg (100 lb 4.8 oz)] 45.5 kg (100 lb 4.8 oz) (01/10 0500)  Weight change: -10.046 kg (-2.4 oz) Filed Weights   07/12/17 0343 07/12/17 1355 07/13/17 0500  Weight: 46.2 kg (101 lb 14.4 oz) 46.2 kg (101 lb 13.6 oz) 45.5 kg (100 lb 4.8 oz)    Intake/Output: I/O last 3 completed shifts: In: -  Out: -549 [Urine:300]   Intake/Output this shift:  No intake/output data recorded.  Physical Exam: General: NAD, laying in bed  Head: Normocephalic, atraumatic. Moist oral mucosal membranes  Eyes: Anicteric, PERRL  Neck: Supple, trachea midline  Lungs:  Clear to auscultation  Heart: Regular rate and rhythm  Abdomen:  Soft, +umbilical hernia  Extremities:  no peripheral edema.  Neurologic: Nonfocal, moving all four extremities  Skin: No lesions  Access: Left AVG no bruit or thrill, RIJ permcath 12/20 Dr. Wyn Quaker    Basic Metabolic Panel: Recent Labs  Lab 07/11/17 1515 07/12/17 0428 07/13/17 0954  NA 159* 140 140  K 6.5* 5.5* 4.6  CL 96* 97* 96*  CO2 32 17* 22  GLUCOSE 68 54* 92  BUN 104* 51* 31*  CREATININE 23.76* 13.80* 9.02*  CALCIUM 5.5* 6.9* 8.3*  PHOS  --  8.6* 7.7*    Liver Function Tests: Recent Labs  Lab 07/11/17 1515 07/12/17 0428 07/13/17 0954  AST  --  20  --   ALT  --  9*  --   ALKPHOS  --  57  --   BILITOT  --  1.9*  --   PROT  --  5.3*  --   ALBUMIN 2.2* 2.4* 2.4*   No results for input(s): LIPASE, AMYLASE in the last 168 hours. No results for input(s): AMMONIA in the last 168 hours.  CBC: Recent Labs  Lab 07/11/17 1348  07/12/17 0428 07/13/17 0954  WBC 7.1 4.4 4.8  HGB 15.0 12.0 11.9*  HCT 46.5 35.7 35.6  MCV 90.3 88.3 88.8  PLT 298 200 221    Cardiac Enzymes: Recent Labs  Lab 07/11/17 1515  TROPONINI 0.16*    BNP: Invalid input(s): POCBNP  CBG: No results for input(s): GLUCAP in the last 168 hours.  Microbiology: Results for orders placed or performed during the hospital encounter of 07/11/17  MRSA PCR Screening     Status: None   Collection Time: 07/11/17  8:25 PM  Result Value Ref Range Status   MRSA by PCR NEGATIVE NEGATIVE Final    Comment:        The GeneXpert MRSA Assay (FDA approved for NASAL specimens only), is one component of a comprehensive MRSA colonization surveillance program. It is not intended to diagnose MRSA infection nor to guide or monitor treatment for MRSA infections. Performed at [facility], [address] [place], [place] [address]   C difficile quick scan w PCR reflex     Status: Abnormal   Collection Time: 07/13/17 12:20 AM  Result Value Ref Range Status   C Diff antigen POSITIVE (A) NEGATIVE Final   C Diff toxin  NEGATIVE NEGATIVE Final   C Diff interpretation Results are indeterminate. See PCR results.  Final    Comment: Performed at [facility], [address] [place], [place] [address]  Gastrointestinal Panel by PCR , Stool     Status: None   Collection Time: 07/13/17 12:20 AM  Result Value Ref Range Status   Campylobacter species NOT DETECTED NOT DETECTED Final   Plesimonas shigelloides NOT DETECTED NOT DETECTED Final   Salmonella species NOT DETECTED NOT DETECTED Final   Yersinia enterocolitica NOT DETECTED NOT DETECTED Final   Vibrio species NOT DETECTED NOT DETECTED Final   Vibrio cholerae NOT DETECTED NOT DETECTED Final   Enteroaggregative E coli (EAEC) NOT DETECTED NOT DETECTED Final   Enteropathogenic E coli (EPEC) NOT DETECTED NOT DETECTED Final   Enterotoxigenic E coli (ETEC) NOT DETECTED NOT DETECTED Final    Shiga like toxin producing E coli (STEC) NOT DETECTED NOT DETECTED Final   Shigella/Enteroinvasive E coli (EIEC) NOT DETECTED NOT DETECTED Final   Cryptosporidium NOT DETECTED NOT DETECTED Final   Cyclospora cayetanensis NOT DETECTED NOT DETECTED Final   Entamoeba histolytica NOT DETECTED NOT DETECTED Final   Giardia lamblia NOT DETECTED NOT DETECTED Final   Adenovirus F40/41 NOT DETECTED NOT DETECTED Final   Astrovirus NOT DETECTED NOT DETECTED Final   Norovirus GI/GII NOT DETECTED NOT DETECTED Final   Rotavirus A NOT DETECTED NOT DETECTED Final   Sapovirus (I, II, IV, and V) NOT DETECTED NOT DETECTED Final    Comment: Performed at [facility], [address] [place], [place] [address]  C. Diff by PCR, Reflexed     Status: Abnormal   Collection Time: 07/13/17 12:20 AM  Result Value Ref Range Status   Toxigenic C. Difficile by PCR POSITIVE (A) NEGATIVE Final    Comment: Positive for toxigenic C. difficile with little to no toxin production. Only treat if clinical presentation suggests symptomatic illness. Performed at [facility], [address] [place], [place] [address]     Coagulation Studies: No results for input(s): LABPROT, INR in the last 72 hours.  Urinalysis: No results for input(s): COLORURINE, LABSPEC, PHURINE, GLUCOSEU, HGBUR, BILIRUBINUR, KETONESUR, PROTEINUR, UROBILINOGEN, NITRITE, LEUKOCYTESUR in the last 72 hours.  Invalid input(s): APPERANCEUR    Imaging: Dg Chest Port 1 View  Result Date: 07/11/2017 CLINICAL DATA:  Weakness, shortness of Breath EXAM: PORTABLE CHEST 1 VIEW COMPARISON:  06/21/2017 FINDINGS: Interval placement of right dialysis catheter with the tip in the right atrium. Left pacer is unchanged. Heart is normal size. Lungs are clear. No effusions or acute bony abnormality. IMPRESSION: No active disease. Electronically Signed   By: Charlett Nose M.D.   On: 07/11/2017 15:54     Medications:   . albumin human     . aspirin  81  mg Oral Daily  . calcium acetate  1,334 mg Oral TID WC  . docusate sodium  100 mg Oral BID  . feeding supplement (NEPRO CARB STEADY)  237 mL Oral BID BM  . heparin injection (subcutaneous)  5,000 Units Subcutaneous Q8H  . latanoprost  1 drop Both Eyes QHS  . lidocaine-prilocaine  1 application Topical UD  . multivitamin  1 tablet Oral QHS  . multivitamin with minerals  1 tablet Oral Once per day on Mon Thu  . vancomycin  125 mg Oral QID  . vitamin C  500 mg Oral BID     Assessment/ Plan:  Ms. GENNIE EISINGER is a 67 y.o. black female with  end stage renal disease on hemodialysis, anemia, hypertension, polycystic kidney disease, history of pacemaker, history of TIA, vertebral artery aneurysm status post coiling  MWF [facility] [address] Left AVG EDW 54.5kg  1. End-stage renal disease: with hyperkalemia, metabolic acidosis and uremic on admission.  Emergent hemodialysis on admission Dialysis for tomorrow.   2. Hypertension: blood pressure at goal. Not currently taking any blood pressure agents.  - history of difficult to control. Home regimen of amlodipine, furosemide, and labetolol.  - Continue to hold medications.   3. Anemia chronic kidney disease:  Hemoglobin 11.9 - normal range.  - holding EPO  4. Secondary hyperparathyroidism: with hyperphosphatemia and hypocalcemia. Holding cinacalcet.  - calcium acetate with meals.  - Check phosphorus today.     LOS: 2 Maizey Menendez 1/10/20192:11 PM

## 2017-07-13 NOTE — Plan of Care (Addendum)
Pt is A&Ox4. VSS. RA . HD postponed until tomorrow per Dr. Luther RedoKollaru.  OOB with standby assist. Enteric isolation precautions maintained. NSR on monitor. No complaints thus far. Will continue to monitor and report to oncoming RN .  Progressing Health Behavior/Discharge Planning: Ability to manage health-related needs will improve 07/13/2017 1711 - Progressing by Jodie EchevariaWhite, Dalis Beers L, RN Clinical Measurements: Ability to maintain clinical measurements within normal limits will improve 07/13/2017 1711 - Progressing by Jodie EchevariaWhite, Colten Desroches L, RN Will remain free from infection 07/13/2017 1711 - Progressing by Jodie EchevariaWhite, Akima Slaugh L, RN Diagnostic test results will improve 07/13/2017 1711 - Progressing by Jodie EchevariaWhite, Alphonso Gregson L, RN Respiratory complications will improve 07/13/2017 1711 - Progressing by Jodie EchevariaWhite, Jarrett Chicoine L, RN Cardiovascular complication will be avoided 07/13/2017 1711 - Progressing by Jodie EchevariaWhite, Donavyn Fecher L, RN Activity: Risk for activity intolerance will decrease 07/13/2017 1711 - Progressing by Jodie EchevariaWhite, Sherrol Vicars L, RN Nutrition: Adequate nutrition will be maintained 07/13/2017 1711 - Progressing by Jodie EchevariaWhite, Cristel Rail L, RN Elimination: Will not experience complications related to bowel motility 07/13/2017 1711 - Progressing by Jodie EchevariaWhite, Kirandeep Fariss L, RN Will not experience complications related to urinary retention 07/13/2017 1711 - Progressing by Jodie EchevariaWhite, Yarden Manuelito L, RN Pain Managment: General experience of comfort will improve 07/13/2017 1711 - Progressing by Jodie EchevariaWhite, Kylii Ennis L, RN Safety: Ability to remain free from injury will improve 07/13/2017 1711 - Progressing by Jodie EchevariaWhite, Brilyn Tuller L, RN Skin Integrity: Risk for impaired skin integrity will decrease 07/13/2017 1711 - Progressing by Jodie EchevariaWhite, Doreena Maulden L, RN Activity: Ability to tolerate increased activity will improve 07/13/2017 1711 - Progressing by Jodie EchevariaWhite, Victoriano Campion L, RN Education: Knowledge of disease and its progression will improve 07/13/2017 1711 - Progressing by Jodie EchevariaWhite, Lucien Budney L, RN Fluid Volume: Compliance with  measures to maintain balanced fluid volume will improve 07/13/2017 1711 - Progressing by Jodie EchevariaWhite, Gracia Saggese L, RN Health Behavior/Discharge Planning: Ability to manage health-related needs will improve 07/13/2017 1711 - Progressing by Jodie EchevariaWhite, Saryiah Bencosme L, RN Nutritional: Ability to make healthy dietary choices will improve 07/13/2017 1711 - Progressing by Jodie EchevariaWhite, Saylah Ketner L, RN Physical Regulation: Complications related to the disease process, condition or treatment will be avoided or minimized 07/13/2017 1711 - Progressing by Jodie EchevariaWhite, Zariah Jost L, RN

## 2017-07-14 LAB — CBC
HCT: 30.5 % — ABNORMAL LOW (ref 35.0–47.0)
HEMOGLOBIN: 10.2 g/dL — AB (ref 12.0–16.0)
MCH: 29.8 pg (ref 26.0–34.0)
MCHC: 33.5 g/dL (ref 32.0–36.0)
MCV: 88.9 fL (ref 80.0–100.0)
PLATELETS: 173 10*3/uL (ref 150–440)
RBC: 3.43 MIL/uL — AB (ref 3.80–5.20)
RDW: 16.6 % — ABNORMAL HIGH (ref 11.5–14.5)
WBC: 4 10*3/uL (ref 3.6–11.0)

## 2017-07-14 MED ORDER — HYDROCODONE-ACETAMINOPHEN 7.5-325 MG PO TABS
1.0000 | ORAL_TABLET | Freq: Four times a day (QID) | ORAL | Status: DC | PRN
  Administered 2017-07-14 – 2017-07-17 (×2): 1 via ORAL
  Filled 2017-07-14 (×2): qty 1

## 2017-07-14 MED ORDER — SODIUM CHLORIDE 0.9 % IV BOLUS (SEPSIS): 1000.0000 mL | Freq: Once | INTRAVENOUS | Status: DC

## 2017-07-14 MED ORDER — DICYCLOMINE HCL 10 MG PO CAPS
10.0000 mg | ORAL_CAPSULE | Freq: Three times a day (TID) | ORAL | Status: DC
  Administered 2017-07-14 – 2017-07-18 (×12): 10 mg via ORAL
  Filled 2017-07-14 (×12): qty 1

## 2017-07-14 NOTE — Plan of Care (Signed)
  Progressing Health Behavior/Discharge Planning: Ability to manage health-related needs will improve 07/14/2017 1621 - Progressing by Rigoberto NoelMorales, Kenslie Abbruzzese Y, RN Clinical Measurements: Will remain free from infection 07/14/2017 1621 - Progressing by Rigoberto NoelMorales, Halah Whiteside Y, RN Diagnostic test results will improve 07/14/2017 1621 - Progressing by Rigoberto NoelMorales, Zendaya Groseclose Y, RN Respiratory complications will improve 07/14/2017 1621 - Progressing by Rigoberto NoelMorales, Eddi Hymes Y, RN

## 2017-07-14 NOTE — Progress Notes (Signed)
[facility]  ROUNDING NOTE   Subjective:   Seen and examined on hemodialysis. Tolerating treatment well. UF even.   In a better mood. Denies any more nausea/vomiting/diarrhea or abd pain   HEMODIALYSIS FLOWSHEET:  Blood Flow Rate (mL/min): 400 mL/min Arterial Pressure (mmHg): -170 mmHg Venous Pressure (mmHg): 120 mmHg Transmembrane Pressure (mmHg): 50 mmHg Ultrafiltration Rate (mL/min): 170 mL/min Dialysate Flow Rate (mL/min): 600 ml/min Conductivity: Machine : 14 Conductivity: Machine : 14 Dialysis Fluid Bolus: Normal Saline Bolus Amount (mL): 100 mL Dialysate Change: 2K   Objective:  Vital signs in last 24 hours:  Temp:  [96.1 F (35.6 C)-98.6 F (37 C)] 96.1 F (35.6 C) (01/11 1212) Pulse Rate:  [62-83] 62 (01/11 1410) Resp:  [17-85] 18 (01/11 1410) BP: (75-105)/(54-70) 94/56 (01/11 1410) SpO2:  [95 %-100 %] 100 % (01/11 1410) Weight:  [46.2 kg (101 lb 13.6 oz)-46.3 kg (102 lb)] 46.2 kg (101 lb 13.6 oz) (01/11 0850)  Weight change: 0.067 kg (2.4 oz) Filed Weights   07/13/17 0500 07/14/17 0318 07/14/17 0850  Weight: 45.5 kg (100 lb 4.8 oz) 46.3 kg (102 lb) 46.2 kg (101 lb 13.6 oz)    Intake/Output: No intake/output data recorded.   Intake/Output this shift:  Total I/O In: -  Out: 12 [Other:12]  Physical Exam: General: NAD, laying in bed  Head: Normocephalic, atraumatic. Moist oral mucosal membranes  Eyes: Anicteric, PERRL  Neck: Supple, trachea midline  Lungs:  Clear to auscultation  Heart: Regular rate and rhythm  Abdomen:  Soft, +umbilical hernia  Extremities:  no peripheral edema.  Neurologic: Nonfocal, moving all four extremities  Skin: No lesions  Access: Left AVG no bruit or thrill, RIJ permcath 12/20 Dr. Wyn Quakerew    Basic Metabolic Panel: Recent Labs  Lab 07/11/17 1515 07/12/17 0428 07/13/17 0954  NA 159* 140 140  K 6.5* 5.5* 4.6  CL 96* 97* 96*  CO2 32 17* 22  GLUCOSE 68 54* 92  BUN 104* 51* 31*  CREATININE 23.76* 13.80*  9.02*  CALCIUM 5.5* 6.9* 8.3*  PHOS  --  8.6* 7.7*    Liver Function Tests: Recent Labs  Lab 07/11/17 1515 07/12/17 0428 07/13/17 0954  AST  --  20  --   ALT  --  9*  --   ALKPHOS  --  57  --   BILITOT  --  1.9*  --   PROT  --  5.3*  --   ALBUMIN 2.2* 2.4* 2.4*   No results for input(s): LIPASE, AMYLASE in the last 168 hours. No results for input(s): AMMONIA in the last 168 hours.  CBC: Recent Labs  Lab 07/11/17 1348 07/12/17 0428 07/13/17 0954 07/14/17 1117  WBC 7.1 4.4 4.8 4.0  HGB 15.0 12.0 11.9* 10.2*  HCT 46.5 35.7 35.6 30.5*  MCV 90.3 88.3 88.8 88.9  PLT 298 200 221 173    Cardiac Enzymes: Recent Labs  Lab 07/11/17 1515  TROPONINI 0.16*    BNP: Invalid input(s): POCBNP  CBG: No results for input(s): GLUCAP in the last 168 hours.  Microbiology: Results for orders placed or performed during the hospital encounter of 07/11/17  MRSA PCR Screening     Status: None   Collection Time: 07/11/17  8:25 PM  Result Value Ref Range Status   MRSA by PCR NEGATIVE NEGATIVE Final    Comment:        The GeneXpert MRSA Assay (FDA approved for NASAL specimens only), is one component of a comprehensive MRSA colonization surveillance  program. It is not intended to diagnose MRSA infection nor to guide or monitor treatment for MRSA infections. Performed at [facility], [address] [place], [place] [address]   C difficile quick scan w PCR reflex     Status: Abnormal   Collection Time: 07/13/17 12:20 AM  Result Value Ref Range Status   C Diff antigen POSITIVE (A) NEGATIVE Final   C Diff toxin NEGATIVE NEGATIVE Final   C Diff interpretation Results are indeterminate. See PCR results.  Final    Comment: Performed at [facility], [address] [place], [place] [address]  Gastrointestinal Panel by PCR , Stool     Status: None   Collection Time: 07/13/17 12:20 AM  Result Value Ref Range Status   Campylobacter species NOT DETECTED NOT  DETECTED Final   Plesimonas shigelloides NOT DETECTED NOT DETECTED Final   Salmonella species NOT DETECTED NOT DETECTED Final   Yersinia enterocolitica NOT DETECTED NOT DETECTED Final   Vibrio species NOT DETECTED NOT DETECTED Final   Vibrio cholerae NOT DETECTED NOT DETECTED Final   Enteroaggregative E coli (EAEC) NOT DETECTED NOT DETECTED Final   Enteropathogenic E coli (EPEC) NOT DETECTED NOT DETECTED Final   Enterotoxigenic E coli (ETEC) NOT DETECTED NOT DETECTED Final   Shiga like toxin producing E coli (STEC) NOT DETECTED NOT DETECTED Final   Shigella/Enteroinvasive E coli (EIEC) NOT DETECTED NOT DETECTED Final   Cryptosporidium NOT DETECTED NOT DETECTED Final   Cyclospora cayetanensis NOT DETECTED NOT DETECTED Final   Entamoeba histolytica NOT DETECTED NOT DETECTED Final   Giardia lamblia NOT DETECTED NOT DETECTED Final   Adenovirus F40/41 NOT DETECTED NOT DETECTED Final   Astrovirus NOT DETECTED NOT DETECTED Final   Norovirus GI/GII NOT DETECTED NOT DETECTED Final   Rotavirus A NOT DETECTED NOT DETECTED Final   Sapovirus (I, II, IV, and V) NOT DETECTED NOT DETECTED Final    Comment: Performed at [facility], [address] [place], [place] [address]  C. Diff by PCR, Reflexed     Status: Abnormal   Collection Time: 07/13/17 12:20 AM  Result Value Ref Range Status   Toxigenic C. Difficile by PCR POSITIVE (A) NEGATIVE Final    Comment: Positive for toxigenic C. difficile with little to no toxin production. Only treat if clinical presentation suggests symptomatic illness. Performed at [facility], [address] [place], [place] [address]     Coagulation Studies: No results for input(s): LABPROT, INR in the last 72 hours.  Urinalysis: No results for input(s): COLORURINE, LABSPEC, PHURINE, GLUCOSEU, HGBUR, BILIRUBINUR, KETONESUR, PROTEINUR, UROBILINOGEN, NITRITE, LEUKOCYTESUR in the last 72 hours.  Invalid input(s): APPERANCEUR    Imaging: No results  found.   Medications:   . albumin human     . aspirin  81 mg Oral Daily  . calcium acetate  1,334 mg Oral TID WC  . dicyclomine  10 mg Oral TID AC  . docusate sodium  100 mg Oral BID  . feeding supplement (NEPRO CARB STEADY)  237 mL Oral BID BM  . heparin injection (subcutaneous)  5,000 Units Subcutaneous Q8H  . latanoprost  1 drop Both Eyes QHS  . lidocaine-prilocaine  1 application Topical UD  . midodrine  10 mg Oral TID WC  . multivitamin  1 tablet Oral QHS  . multivitamin with minerals  1 tablet Oral Once per day on Mon Thu  . sodium chloride flush  3 mL Intravenous Q12H  . vancomycin  125 mg Oral  QID  . vitamin C  500 mg Oral BID     Assessment/ Plan:  Ms. Melissa Bradshaw is a 67 y.o. black female with end stage renal disease on hemodialysis, anemia, hypertension, polycystic kidney disease, history of pacemaker, history of TIA, vertebral artery aneurysm status post coiling  MWF [facility] [address] Left AVG EDW 54.5kg  1. End-stage renal disease: with hyperkalemia, metabolic acidosis and uremic on admission.  Emergent hemodialysis on admission Seen and examined on hemodialysis. Tolerating treatment well.   2. Hypertension: Hypotensive.  Not currently taking any blood pressure agents.  - history of difficult to control. Home regimen of amlodipine, furosemide, and labetolol.  - Continue to hold medications.   3. Anemia chronic kidney disease:  Hemoglobin 10.2 - normal range.  - holding EPO  4. Secondary hyperparathyroidism: with hyperphosphatemia and hypocalcemia. Holding cinacalcet.  - calcium acetate with meals.   5. C. Diff colitis - PO vancomycin.     LOS: 3 Anessia Oakland 1/11/20192:39 PM

## 2017-07-14 NOTE — Progress Notes (Signed)
Dialysis treatment started. 

## 2017-07-14 NOTE — Progress Notes (Signed)
Dialysis pre-assessment 

## 2017-07-14 NOTE — Progress Notes (Signed)
BP 75/54 patient c/o light headedness Saline Bolus given with good results blood pressure 103/65

## 2017-07-14 NOTE — Progress Notes (Signed)
[facility] [place] [facility]   PATIENT NAME: Wyatt PortelaCaroline Winsett    MR#:  [id number]  DATE OF BIRTH:  09/20/1950  SUBJECTIVE:  CHIEF COMPLAINT:   Chief Complaint  Patient presents with  . Weakness    REVIEW OF SYSTEMS:  CONSTITUTIONAL: No fever, fatigue or weakness.  EYES: No blurred or double vision.  EARS, NOSE, AND THROAT: No tinnitus or ear pain.  RESPIRATORY: No cough, shortness of breath, wheezing or hemoptysis.  CARDIOVASCULAR: No chest pain, orthopnea, edema.  GASTROINTESTINAL: No nausea, vomiting, diarrhea or abdominal pain.  GENITOURINARY: No dysuria, hematuria.  ENDOCRINE: No polyuria, nocturia,  HEMATOLOGY: No anemia, easy bruising or bleeding SKIN: No rash or lesion. MUSCULOSKELETAL: No joint pain or arthritis.   NEUROLOGIC: No tingling, numbness, weakness.  PSYCHIATRY: No anxiety or depression.   ROS  DRUG ALLERGIES:   Allergies  Allergen Reactions  . Hydralazine Hcl Other (See Comments)    Makes pt jittery, nervous, messes with vision   . Iodine Itching and Rash    Unknown reaction  . Ivp Dye [Iodinated Diagnostic Agents] Rash    VITALS:  Blood pressure 105/64, pulse 76, temperature (!) 96.1 F (35.6 C), temperature source Oral, resp. rate (!) 68, height 5\' 6"  (1.676 m), weight 46.2 kg (101 lb 13.6 oz), SpO2 100 %.  PHYSICAL EXAMINATION:  GENERAL:  67 y.o.-year-old patient lying in the bed with no acute distress.  EYES: Pupils equal, round, reactive to light and accommodation. No scleral icterus. Extraocular muscles intact.  HEENT: Head atraumatic, normocephalic. Oropharynx and nasopharynx clear.  NECK:  Supple, no jugular venous distention. No thyroid enlargement, no tenderness.  LUNGS: Normal breath sounds bilaterally, no wheezing, rales,rhonchi or crepitation. No use of accessory muscles of respiration.  CARDIOVASCULAR: S1, S2 normal. No murmurs, rubs, or gallops.  ABDOMEN: Soft, nontender, nondistended. Bowel sounds present. No  organomegaly or mass.  EXTREMITIES: No pedal edema, cyanosis, or clubbing.  NEUROLOGIC: Cranial nerves II through XII are intact. Muscle strength 5/5 in all extremities. Sensation intact. Gait not checked.  PSYCHIATRIC: The patient is alert and oriented x 3.  SKIN: No obvious rash, lesion, or ulcer.   Physical Exam LABORATORY PANEL:   CBC Recent Labs  Lab 07/14/17 1117  WBC 4.0  HGB 10.2*  HCT 30.5*  PLT 173   ------------------------------------------------------------------------------------------------------------------  Chemistries  Recent Labs  Lab 07/12/17 0428 07/13/17 0954  NA 140 140  K 5.5* 4.6  CL 97* 96*  CO2 17* 22  GLUCOSE 54* 92  BUN 51* 31*  CREATININE 13.80* 9.02*  CALCIUM 6.9* 8.3*  AST 20  --   ALT 9*  --   ALKPHOS 57  --   BILITOT 1.9*  --    ------------------------------------------------------------------------------------------------------------------  Cardiac Enzymes Recent Labs  Lab 07/11/17 1515  TROPONINI 0.16*   ------------------------------------------------------------------------------------------------------------------  RADIOLOGY:  No results found.  ASSESSMENT AND PLAN:   [name] a67 y.o.femalewith a known history of end-stage renal disease on hemodialysis on Tuesday Thursday and Saturday missed her hemodialysis for 1 week Patient reports that she was unable to drive as she was feeling weak and nobody could give her ride.Patient is reporting feeling tired and drowsy. Also reports nauseous  1 ESRDwith clinical uremia Uremia has resolved Patient missed her hemodialysis for 1 week and last hemodialysis was 06/26/2017 Nephrology following for hemodialysis needs-their input is greatly appreciated, currently receiving hemodialysis     2 Hyperkalemia Resolved Did receive sodium bicarb and calcium gluconate in the ED as well as hemodialysis  3 Essential hypertension Stable  Antihypertensives on hold  for continued relative hypotension  4 persistent hypotension Resolved Continue Midodrine tid, did receive 1 L fluid bolus July 14, 2017 with good results  5 Generalized weakness with nausea and dry heaving Nausea/dry heaves have resolved Most likely secondary to chronic ESRD Patient refused physical therapy   6 acute elevated troponin Most likely secondary to chronic kidney disease   7 Severe malnutrition Dietitian consulted  8 acute C. difficile colitis Resolving-no bowel movement in 2-3 days Continue vancomycin p.o. for 10-14-day course  Condition stable Prognosis poor given noncompliance Discussed with Dr.Kolluru  Disposition to home on tomorrow barring any complications  All the records are reviewed and case discussed with Care Management/Social Workerr. Management plans discussed with the patient, family and they are in agreement.  CODE STATUS: DNR  TOTAL TIME TAKING CARE OF THIS PATIENT: 40 minutes.     POSSIBLE D/C IN 1-2 DAYS, DEPENDING ON CLINICAL CONDITION.   Evelena Asa Keajah Killough M.D on 07/14/2017   Between 7am to 6pm - Pager - [phone]  After 6pm go to [email or web address] - password [email or web address]  [facility]  Office  [phone]  CC: Primary care physician; Corky Downs, MD  Note: This dictation was prepared with Dragon dictation along with smaller phrase technology. Any transcriptional errors that result from this process are unintentional.

## 2017-07-14 NOTE — Progress Notes (Signed)
Post dialysis assessment 

## 2017-07-14 NOTE — Care Management Important Message (Signed)
Important Message  Patient Details  Name: Melissa Bradshaw MRN: [id number] Date of Birth: 11/05/1950   Medicare Important Message Given:  Yes Signed IM notice given    Eber HongGreene, Curren Mohrmann R, RN 07/14/2017, 8:32 AM

## 2017-07-14 NOTE — Care Management (Signed)
Spoke with patient about the need not to miss dialysis treatments and she verbalizes understanding.  she verbally welcomes assistance with transportation to her appointments.  She also verbalizes the desire to move [facility] from [facility] [address] to the clinic on [address] because it is 2 minutes from her home. The schedule- "does not matter if I can get switched to [address].  Also discussed discharging home on Vancomycin. Pharmacy is [facility] [address].  Pharmacy has the medication on hand and copay for 10 days of therapy is 3 dollars.  Dimas Chylemanda  Morris will continue to work with patient on her transportation and dialysis center

## 2017-07-14 NOTE — Progress Notes (Signed)
Patient c/o stomach pain blood pressure low Saline bolus given. Nurse will bring Tylenol to patient for pain.

## 2017-07-14 NOTE — Progress Notes (Signed)
Pt complaining of abdominal pain 7 out of 10. Dr. Katheren ShamsSalary notified. Orders received for Bentyl and Norco PRN if Bentyl doesn't relieve pain. Will continue to monitor.

## 2017-07-15 LAB — LACTIC ACID, PLASMA
LACTIC ACID, VENOUS: 1.2 mmol/L (ref 0.5–1.9)
LACTIC ACID, VENOUS: 1.1 mmol/L (ref 0.5–1.9)

## 2017-07-15 LAB — CBC
HEMATOCRIT: 33.2 % — AB (ref 35.0–47.0)
Hemoglobin: 10.8 g/dL — ABNORMAL LOW (ref 12.0–16.0)
MCH: 29.2 pg (ref 26.0–34.0)
MCHC: 32.5 g/dL (ref 32.0–36.0)
MCV: 89.6 fL (ref 80.0–100.0)
Platelets: 178 10*3/uL (ref 150–440)
RBC: 3.7 MIL/uL — AB (ref 3.80–5.20)
RDW: 16.2 % — ABNORMAL HIGH (ref 11.5–14.5)
WBC: 5.1 10*3/uL (ref 3.6–11.0)

## 2017-07-15 LAB — RENAL FUNCTION PANEL
ANION GAP: 13 (ref 5–15)
Albumin: 2 g/dL — ABNORMAL LOW (ref 3.5–5.0)
BUN: 18 mg/dL (ref 6–20)
CHLORIDE: 99 mmol/L — AB (ref 101–111)
CO2: 24 mmol/L (ref 22–32)
Calcium: 7.9 mg/dL — ABNORMAL LOW (ref 8.9–10.3)
Creatinine, Ser: 5.71 mg/dL — ABNORMAL HIGH (ref 0.44–1.00)
GFR calc Af Amer: 8 mL/min — ABNORMAL LOW (ref >60)
GFR, EST NON AFRICAN AMERICAN: 7 mL/min — AB (ref >60)
Glucose, Bld: 79 mg/dL (ref 65–99)
POTASSIUM: 4.1 mmol/L (ref 3.5–5.1)
Phosphorus: 4.4 mg/dL (ref 2.5–4.6)
Sodium: 136 mmol/L (ref 135–145)

## 2017-07-15 MED ORDER — SACCHAROMYCES BOULARDII 250 MG PO CAPS
250.0000 mg | ORAL_CAPSULE | Freq: Two times a day (BID) | ORAL | Status: DC
  Administered 2017-07-15 – 2017-07-18 (×6): 250 mg via ORAL
  Filled 2017-07-15 (×6): qty 1

## 2017-07-15 MED ORDER — METOCLOPRAMIDE HCL 5 MG PO TABS
5.0000 mg | ORAL_TABLET | Freq: Three times a day (TID) | ORAL | Status: DC
  Administered 2017-07-15 – 2017-07-16 (×4): 5 mg via ORAL
  Filled 2017-07-15 (×4): qty 1

## 2017-07-15 MED ORDER — MEGESTROL ACETATE 400 MG/10ML PO SUSP
400.0000 mg | Freq: Two times a day (BID) | ORAL | Status: DC
  Administered 2017-07-15 – 2017-07-18 (×6): 400 mg via ORAL
  Filled 2017-07-15 (×6): qty 10

## 2017-07-15 MED ORDER — SODIUM CHLORIDE 0.9 % IV BOLUS (SEPSIS)
500.0000 mL | Freq: Once | INTRAVENOUS | Status: AC
  Administered 2017-07-15: 500 mL via INTRAVENOUS

## 2017-07-15 MED ORDER — LACTINEX PO CHEW: 1.0000 | CHEWABLE_TABLET | Freq: Three times a day (TID) | ORAL | Status: DC

## 2017-07-15 NOTE — Progress Notes (Signed)
[facility]  ROUNDING NOTE   Subjective:   Hemodialysis treatment yesterday. Tolerated treatment well. UF minimal.  Nausea has improved. 1 episode of diarrhea.   Objective:  Vital signs in last 24 hours:  Temp:  [96.1 F (35.6 C)-98.9 F (37.2 C)] 98.9 F (37.2 C) (01/12 0750) Pulse Rate:  [61-124] 124 (01/12 0750) Resp:  [18-68] 18 (01/12 0457) BP: (89-105)/(53-67) 89/60 (01/12 0750) SpO2:  [97 %-100 %] 98 % (01/12 0750) Weight:  [46.5 kg (102 lb 9.6 oz)] 46.5 kg (102 lb 9.6 oz) (01/12 0457)  Weight change: -0.067 kg (-2.4 oz) Filed Weights   07/14/17 0318 07/14/17 0850 07/15/17 0457  Weight: 46.3 kg (102 lb) 46.2 kg (101 lb 13.6 oz) 46.5 kg (102 lb 9.6 oz)    Intake/Output: I/O last 3 completed shifts: In: 0  Out: 12 [Other:12]   Intake/Output this shift:  No intake/output data recorded.  Physical Exam: General: NAD, laying in bed  Head: Normocephalic, atraumatic. Moist oral mucosal membranes  Eyes: Anicteric, PERRL  Neck: Supple, trachea midline  Lungs:  Clear to auscultation  Heart: Regular rate and rhythm  Abdomen:  Soft, +umbilical hernia  Extremities:  no peripheral edema.  Neurologic: Nonfocal, moving all four extremities  Skin: No lesions  Access: Left AVG no bruit or thrill, RIJ permcath 12/20 Dr. Wyn Quaker    Basic Metabolic Panel: Recent Labs  Lab 07/11/17 1515 07/12/17 0428 07/13/17 0954 07/15/17 0505  NA 159* 140 140 136  K 6.5* 5.5* 4.6 4.1  CL 96* 97* 96* 99*  CO2 32 17* 22 24  GLUCOSE 68 54* 92 79  BUN 104* 51* 31* 18  CREATININE 23.76* 13.80* 9.02* 5.71*  CALCIUM 5.5* 6.9* 8.3* 7.9*  PHOS  --  8.6* 7.7* 4.4    Liver Function Tests: Recent Labs  Lab 07/11/17 1515 07/12/17 0428 07/13/17 0954 07/15/17 0505  AST  --  20  --   --   ALT  --  9*  --   --   ALKPHOS  --  57  --   --   BILITOT  --  1.9*  --   --   PROT  --  5.3*  --   --   ALBUMIN 2.2* 2.4* 2.4* 2.0*   No results for input(s): LIPASE, AMYLASE in the last  168 hours. No results for input(s): AMMONIA in the last 168 hours.  CBC: Recent Labs  Lab 07/11/17 1348 07/12/17 0428 07/13/17 0954 07/14/17 1117 07/15/17 0505  WBC 7.1 4.4 4.8 4.0 5.1  HGB 15.0 12.0 11.9* 10.2* 10.8*  HCT 46.5 35.7 35.6 30.5* 33.2*  MCV 90.3 88.3 88.8 88.9 89.6  PLT 298 200 221 173 178    Cardiac Enzymes: Recent Labs  Lab 07/11/17 1515  TROPONINI 0.16*    BNP: Invalid input(s): POCBNP  CBG: No results for input(s): GLUCAP in the last 168 hours.  Microbiology: Results for orders placed or performed during the hospital encounter of 07/11/17  MRSA PCR Screening     Status: None   Collection Time: 07/11/17  8:25 PM  Result Value Ref Range Status   MRSA by PCR NEGATIVE NEGATIVE Final    Comment:        The GeneXpert MRSA Assay (FDA approved for NASAL specimens only), is one component of a comprehensive MRSA colonization surveillance program. It is not intended to diagnose MRSA infection nor to guide or monitor treatment for MRSA infections. Performed at [facility], [address], [place], [place]  [id number]   C difficile quick scan w PCR reflex     Status: Abnormal   Collection Time: 07/13/17 12:20 AM  Result Value Ref Range Status   C Diff antigen POSITIVE (A) NEGATIVE Final   C Diff toxin NEGATIVE NEGATIVE Final   C Diff interpretation Results are indeterminate. See PCR results.  Final    Comment: Performed at [facility], [address] [place], [place] [address]  Gastrointestinal Panel by PCR , Stool     Status: None   Collection Time: 07/13/17 12:20 AM  Result Value Ref Range Status   Campylobacter species NOT DETECTED NOT DETECTED Final   Plesimonas shigelloides NOT DETECTED NOT DETECTED Final   Salmonella species NOT DETECTED NOT DETECTED Final   Yersinia enterocolitica NOT DETECTED NOT DETECTED Final   Vibrio species NOT DETECTED NOT DETECTED Final   Vibrio cholerae NOT DETECTED NOT DETECTED Final    Enteroaggregative E coli (EAEC) NOT DETECTED NOT DETECTED Final   Enteropathogenic E coli (EPEC) NOT DETECTED NOT DETECTED Final   Enterotoxigenic E coli (ETEC) NOT DETECTED NOT DETECTED Final   Shiga like toxin producing E coli (STEC) NOT DETECTED NOT DETECTED Final   Shigella/Enteroinvasive E coli (EIEC) NOT DETECTED NOT DETECTED Final   Cryptosporidium NOT DETECTED NOT DETECTED Final   Cyclospora cayetanensis NOT DETECTED NOT DETECTED Final   Entamoeba histolytica NOT DETECTED NOT DETECTED Final   Giardia lamblia NOT DETECTED NOT DETECTED Final   Adenovirus F40/41 NOT DETECTED NOT DETECTED Final   Astrovirus NOT DETECTED NOT DETECTED Final   Norovirus GI/GII NOT DETECTED NOT DETECTED Final   Rotavirus A NOT DETECTED NOT DETECTED Final   Sapovirus (I, II, IV, and V) NOT DETECTED NOT DETECTED Final    Comment: Performed at [facility], [address] [place], [place] [address]  C. Diff by PCR, Reflexed     Status: Abnormal   Collection Time: 07/13/17 12:20 AM  Result Value Ref Range Status   Toxigenic C. Difficile by PCR POSITIVE (A) NEGATIVE Final    Comment: Positive for toxigenic C. difficile with little to no toxin production. Only treat if clinical presentation suggests symptomatic illness. Performed at [facility], [address] [place], [place] [address]     Coagulation Studies: No results for input(s): LABPROT, INR in the last 72 hours.  Urinalysis: No results for input(s): COLORURINE, LABSPEC, PHURINE, GLUCOSEU, HGBUR, BILIRUBINUR, KETONESUR, PROTEINUR, UROBILINOGEN, NITRITE, LEUKOCYTESUR in the last 72 hours.  Invalid input(s): APPERANCEUR    Imaging: No results found.   Medications:   . albumin human     . aspirin  81 mg Oral Daily  . calcium acetate  1,334 mg Oral TID WC  . dicyclomine  10 mg Oral TID AC  . docusate sodium  100 mg Oral BID  . feeding supplement (NEPRO CARB STEADY)  237 mL Oral BID BM  . heparin injection  (subcutaneous)  5,000 Units Subcutaneous Q8H  . latanoprost  1 drop Both Eyes QHS  . lidocaine-prilocaine  1 application Topical UD  . midodrine  10 mg Oral TID WC  . multivitamin  1 tablet Oral QHS  . multivitamin with minerals  1 tablet Oral Once per day on Mon Thu  . sodium chloride flush  3 mL Intravenous Q12H  . vancomycin  125 mg Oral QID  . vitamin C  500 mg Oral BID     Assessment/ Plan:  Melissa Bradshaw is a 67 y.o. black female with end  stage renal disease on hemodialysis, anemia, hypertension, polycystic kidney disease, history of pacemaker, history of TIA, vertebral artery aneurysm status post coiling  MWF [facility] [address] Left AVG EDW 54.5kg  1. End-stage renal disease: with hyperkalemia, metabolic acidosis and uremic on admission.  Emergent hemodialysis on admission. Had missed greater than 2 weeks of hemodialysis before admission Tolerated treatment yesterday. Continue MWF schedule.   2. Hypertension: Hypotensive. Way below her dry weight. - patient seems to have lost weight due to infectious colitis and failure to thrive from uremia.  Not currently taking any blood pressure agents.  - history of difficult to control. Home regimen of amlodipine, furosemide, and labetolol.  - Continue to hold medications.   3. Anemia chronic kidney disease:  Hemoglobin 10.8 - normal range.  - holding EPO  4. Secondary hyperparathyroidism: phosphorus and calcium at goal.  - Holding cinacalcet.  - calcium acetate with meals.   5. C. Diff colitis - PO vancomycin.     LOS: 4 Melissa Bradshaw 1/12/201911:49 AM

## 2017-07-15 NOTE — Progress Notes (Signed)
[facility] [place] at [facility]   PATIENT NAME: Melissa Bradshaw    MR#:  [id number]  DATE OF BIRTH:  09/02/1950  SUBJECTIVE:  CHIEF COMPLAINT:   Chief Complaint  Patient presents with  . Weakness  Continues to complain of poor appetite, nausea without emesis, one episode of loose stool today-first since Wednesday   REVIEW OF SYSTEMS:  CONSTITUTIONAL: No fever, fatigue or weakness.  EYES: No blurred or double vision.  EARS, NOSE, AND THROAT: No tinnitus or ear pain.  RESPIRATORY: No cough, shortness of breath, wheezing or hemoptysis.  CARDIOVASCULAR: No chest pain, orthopnea, edema.  GASTROINTESTINAL: No nausea, vomiting, diarrhea or abdominal pain.  GENITOURINARY: No dysuria, hematuria.  ENDOCRINE: No polyuria, nocturia,  HEMATOLOGY: No anemia, easy bruising or bleeding SKIN: No rash or lesion. MUSCULOSKELETAL: No joint pain or arthritis.   NEUROLOGIC: No tingling, numbness, weakness.  PSYCHIATRY: No anxiety or depression.   ROS  DRUG ALLERGIES:   Allergies  Allergen Reactions  . Hydralazine Hcl Other (See Comments)    Makes pt jittery, nervous, messes with vision   . Iodine Itching and Rash    Unknown reaction  . Ivp Dye [Iodinated Diagnostic Agents] Rash    VITALS:  Blood pressure (!) 89/62, pulse 78, temperature 98.9 F (37.2 C), temperature source Oral, resp. rate 18, height 5\' 6"  (1.676 m), weight 46.5 kg (102 lb 9.6 oz), SpO2 100 %.  PHYSICAL EXAMINATION:  GENERAL:  67 y.o.-year-old patient lying in the bed with no acute distress.  EYES: Pupils equal, round, reactive to light and accommodation. No scleral icterus. Extraocular muscles intact.  HEENT: Head atraumatic, normocephalic. Oropharynx and nasopharynx clear.  NECK:  Supple, no jugular venous distention. No thyroid enlargement, no tenderness.  LUNGS: Normal breath sounds bilaterally, no wheezing, rales,rhonchi or crepitation. No use of accessory muscles of respiration.  CARDIOVASCULAR:  S1, S2 normal. No murmurs, rubs, or gallops.  ABDOMEN: Soft, nontender, nondistended. Bowel sounds present. No organomegaly or mass.  EXTREMITIES: No pedal edema, cyanosis, or clubbing.  NEUROLOGIC: Cranial nerves II through XII are intact. Muscle strength 5/5 in all extremities. Sensation intact. Gait not checked.  PSYCHIATRIC: The patient is alert and oriented x 3.  SKIN: No obvious rash, lesion, or ulcer.   Physical Exam LABORATORY PANEL:   CBC Recent Labs  Lab 07/15/17 0505  WBC 5.1  HGB 10.8*  HCT 33.2*  PLT 178   ------------------------------------------------------------------------------------------------------------------  Chemistries  Recent Labs  Lab 07/12/17 0428  07/15/17 0505  NA 140   < > 136  K 5.5*   < > 4.1  CL 97*   < > 99*  CO2 17*   < > 24  GLUCOSE 54*   < > 79  BUN 51*   < > 18  CREATININE 13.80*   < > 5.71*  CALCIUM 6.9*   < > 7.9*  AST 20  --   --   ALT 9*  --   --   ALKPHOS 57  --   --   BILITOT 1.9*  --   --    < > = values in this interval not displayed.   ------------------------------------------------------------------------------------------------------------------  Cardiac Enzymes Recent Labs  Lab 07/11/17 1515  TROPONINI 0.16*   ------------------------------------------------------------------------------------------------------------------  RADIOLOGY:  No results found.  ASSESSMENT AND PLAN:  [name] a67 y.o.femalewith a known history of end-stage renal disease on hemodialysis on Tuesday Thursday and Saturday missed her hemodialysis for 1 week Patient reports that she was unable to drive as she  was feeling weak and nobody could give her ride.Patient is reporting feeling tired and drowsy. Also reports nauseous  1ESRDwith clinical uremia Stable Receives dialysis on MWF Uremia has resolved Patient missed her hemodialysis for 1-2 week and last hemodialysis was 06/26/2017 Nephrology following for  hemodialysis needs-their input is greatly appreciated, currently receiving hemodialysis   2Hyperkalemia Resolved Did receivesodium bicarb and calcium gluconate in the EDas well as hemodialysis  3Essential hypertension Stable  Antihypertensives on hold for continued hypotension  4acute persistent hypotension Did respond to fluid challenge on yesterday, will bolus 500 cc now, continue Midodrine  5Generalized weakness with nausea and dry heaving Most likely secondary to chronic ESRD Patient refused physical therapy Antiemetics PRN, schedule Reglan  6acute elevated troponin Most likely secondary to chronic kidney disease  7Severe malnutrition Dietitian consulted  8acute C. difficile colitis Resolving Continue vancomycin p.o. for 10-14-day course Lactinex 3 times daily  Condition stable Prognosis poor given noncompliance Discussed with Dr.Kolluru   All the records are reviewed and case discussed with Care Management/Social Workerr. Management plans discussed with the patient, family and they are in agreement.  CODE STATUS: dnr  TOTAL TIME TAKING CARE OF THIS PATIENT: 40 minutes.     POSSIBLE D/C IN 2-3 DAYS, DEPENDING ON CLINICAL CONDITION.   Evelena Asa [name] M.D on 07/15/2017   Between 7am to 6pm - Pager - [phone]  After 6pm go to [email or web address] - password [email or web address]  [facility]  Office  [phone]  CC: Primary care physician; Corky Downs, MD  Note: This dictation was prepared with Dragon dictation along with smaller phrase technology. Any transcriptional errors that result from this process are unintentional.

## 2017-07-16 ENCOUNTER — Inpatient Hospital Stay: Payer: Medicare Other

## 2017-07-16 MED ORDER — METOCLOPRAMIDE HCL 5 MG/ML IJ SOLN
10.0000 mg | Freq: Three times a day (TID) | INTRAMUSCULAR | Status: DC
  Administered 2017-07-16 – 2017-07-18 (×6): 10 mg via INTRAVENOUS
  Filled 2017-07-16 (×6): qty 2

## 2017-07-16 MED ORDER — SODIUM CHLORIDE 0.9 % IV SOLN: INTRAVENOUS | Status: DC

## 2017-07-16 MED ORDER — SODIUM CHLORIDE 0.9 % IV BOLUS (SEPSIS): 1000.0000 mL | Freq: Once | INTRAVENOUS | Status: DC

## 2017-07-16 NOTE — Evaluation (Signed)
Physical Therapy Evaluation Patient Details Name: Melissa Bradshaw MRN: [id number] DOB: 1951/01/21 Today's Date: 07/16/2017   History of Present Illness  Pt admitted for fluid overload after c/o general weakness.  PMH includes pacemaker implantation, Htn, HA, glaucoma, stomach ulcer and CKD.  Clinical Impression  Pt is a 67 year old female who lives in a one story home alone.  Pt was able to perform bed mobility independently or with use of bedrail and performed STS with improper use of RW.  Pt preferred to perform ambulation today with RW and fatigued following 15 ft, stating that she needed to sit down.  PT educated pt concerning use of RW and pt stated that she tries to avoid using her RW so that she does not become dependent.  Pt presented with overall weakness of UE and LE and sensation intact in LE.  Pt will continue to benefit from skilled PT with focus on strength, tolerance to activity, stair negotiation and proper use of AD.    Follow Up Recommendations Home health PT(This is contingent on the fact that the daughter is available for 24 hr care.  Pt seemed unsure as to when her daughter would arrive.)    Equipment Recommendations       Recommendations for Other Services       Precautions / Restrictions Precautions Precautions: Fall Restrictions Weight Bearing Restrictions: No      Mobility  Bed Mobility Overal bed mobility: Independent                Transfers Overall transfer level: Modified independent Equipment used: Rolling walker (2 wheeled)             General transfer comment: Pt used RW for assistance in STS and PT educated pt concerning proper hand placement.  Pt expressed understanding.  Ambulation/Gait Ambulation/Gait assistance: Modified independent (Device/Increase time) Ambulation Distance (Feet): 30 Feet Assistive device: Rolling walker (2 wheeled)     Gait velocity interpretation: Below normal speed for age/gender General Gait Details: Pt  presented with a slow gait, low foot clearance and flexed trunk with use of RW.  Pt fatigued following 15 ft of ambulation in room and stated that she needed to sit down.  Pt was able to return to recliner without assistance.  Stairs            Wheelchair Mobility    Modified Rankin (Stroke Patients Only)       Balance Overall balance assessment: Modified Independent                                           Pertinent Vitals/Pain Pain Assessment: No/denies pain    Home Living Family/patient expects to be discharged to:: Private residence Living Arrangements: Alone Available Help at Discharge: Family(Daughter) Type of Home: House Home Access: Stairs to enter Entrance Stairs-Rails: Can reach both Entrance Stairs-Number of Steps: 3 Home Layout: One level Home Equipment: Walker - 2 wheels      Prior Function Level of Independence: Independent         Comments: Pt states that she tries not to use her RW because she doesn't want to become dependent on it.     Hand Dominance        Extremity/Trunk Assessment   Upper Extremity Assessment Upper Extremity Assessment: Generalized weakness    Lower Extremity Assessment Lower Extremity Assessment: Generalized weakness  Cervical / Trunk Assessment Cervical / Trunk Assessment: Kyphotic  Communication   Communication: No difficulties  Cognition Arousal/Alertness: Awake/alert Behavior During Therapy: WFL for tasks assessed/performed Overall Cognitive Status: Within Functional Limits for tasks assessed                                        General Comments      Exercises     Assessment/Plan    PT Assessment Patient needs continued PT services  PT Problem List Decreased strength;Decreased activity tolerance;Decreased balance;Decreased mobility;Decreased knowledge of precautions       PT Treatment Interventions DME instruction;Gait training;Stair training;Functional  mobility training;Therapeutic activities;Therapeutic exercise;Balance training;Patient/family education    PT Goals (Current goals can be found in the Care Plan section)  Acute Rehab PT Goals Patient Stated Goal: to return home and continue to regain strength. PT Goal Formulation: With patient Time For Goal Achievement: 07/30/17 Potential to Achieve Goals: Good    Frequency Min 2X/week   Barriers to discharge        Co-evaluation               AM-PAC PT "6 Clicks" Daily Activity  Outcome Measure Difficulty turning over in bed (including adjusting bedclothes, sheets and blankets)?: A Little Difficulty moving from lying on back to sitting on the side of the bed? : A Little Difficulty sitting down on and standing up from a chair with arms (e.g., wheelchair, bedside commode, etc,.)?: A Little Help needed moving to and from a bed to chair (including a wheelchair)?: A Little Help needed walking in hospital room?: A Little Help needed climbing 3-5 steps with a railing? : A Little 6 Click Score: 18    End of Session Equipment Utilized During Treatment: Gait belt Activity Tolerance: Patient limited by fatigue Patient left: in chair;with chair alarm set;with call bell/phone within reach   PT Visit Diagnosis: Unsteadiness on feet (R26.81);Muscle weakness (generalized) (M62.81)    Time: 0930-1000 PT Time Calculation (min) (ACUTE ONLY): 30 min   Charges:   PT Evaluation $PT Eval Low Complexity: 1 Low PT Treatments $Therapeutic Activity: 8-22 mins   PT G Codes:   PT G-Codes **NOT FOR INPATIENT CLASS** Functional Assessment Tool Used: AM-PAC 6 Clicks Basic Mobility Functional Limitation: Mobility: Walking and moving around Mobility: Walking and Moving Around Current Status ([id number]): At least 40 percent but less than 60 percent impaired, limited or restricted Mobility: Walking and Moving Around Goal Status [phone]): At least 1 percent but less than 20 percent impaired, limited or  restricted    Glenetta HewSarah Geri Hepler, PT, DPT   Glenetta HewSarah Gelsey Amyx 07/16/2017, 11:09 AM

## 2017-07-16 NOTE — Progress Notes (Signed)
[facility]  ROUNDING NOTE   Subjective:   Patient eating better. Denies any nausea this morning.   No more diarrhea reported.   Given IV fluids yesterday due to hypotension  Objective:  Vital signs in last 24 hours:  Temp:  [97.9 F (36.6 C)-98.6 F (37 C)] 98.3 F (36.8 C) (01/13 0805) Pulse Rate:  [57-78] 65 (01/13 1011) Resp:  [18-19] 18 (01/13 1011) BP: (82-113)/(56-70) 105/62 (01/13 1011) SpO2:  [99 %-100 %] 100 % (01/13 1011) Weight:  [46.2 kg (101 lb 14.4 oz)] 46.2 kg (101 lb 14.4 oz) (01/13 0347)  Weight change: 0.022 kg (0.8 oz) Filed Weights   07/14/17 0850 07/15/17 0457 07/16/17 0347  Weight: 46.2 kg (101 lb 13.6 oz) 46.5 kg (102 lb 9.6 oz) 46.2 kg (101 lb 14.4 oz)    Intake/Output: I/O last 3 completed shifts: In: 500 [IV Piggyback:500] Out: 0    Intake/Output this shift:  No intake/output data recorded.  Physical Exam: General: NAD, laying in bed  Head: Normocephalic, atraumatic. Moist oral mucosal membranes  Eyes: Anicteric, PERRL  Neck: Supple, trachea midline  Lungs:  Clear to auscultation  Heart: Regular rate and rhythm  Abdomen:  + distended, +umbilical hernia  Extremities:  no peripheral edema.  Neurologic: Nonfocal, moving all four extremities  Skin: No lesions  Access: Left AVG no bruit or thrill, RIJ permcath 12/20 Dr. Wyn Quaker    Basic Metabolic Panel: Recent Labs  Lab 07/11/17 1515 07/12/17 0428 07/13/17 0954 07/15/17 0505  NA 159* 140 140 136  K 6.5* 5.5* 4.6 4.1  CL 96* 97* 96* 99*  CO2 32 17* 22 24  GLUCOSE 68 54* 92 79  BUN 104* 51* 31* 18  CREATININE 23.76* 13.80* 9.02* 5.71*  CALCIUM 5.5* 6.9* 8.3* 7.9*  PHOS  --  8.6* 7.7* 4.4    Liver Function Tests: Recent Labs  Lab 07/11/17 1515 07/12/17 0428 07/13/17 0954 07/15/17 0505  AST  --  20  --   --   ALT  --  9*  --   --   ALKPHOS  --  57  --   --   BILITOT  --  1.9*  --   --   PROT  --  5.3*  --   --   ALBUMIN 2.2* 2.4* 2.4* 2.0*   No results for  input(s): LIPASE, AMYLASE in the last 168 hours. No results for input(s): AMMONIA in the last 168 hours.  CBC: Recent Labs  Lab 07/11/17 1348 07/12/17 0428 07/13/17 0954 07/14/17 1117 07/15/17 0505  WBC 7.1 4.4 4.8 4.0 5.1  HGB 15.0 12.0 11.9* 10.2* 10.8*  HCT 46.5 35.7 35.6 30.5* 33.2*  MCV 90.3 88.3 88.8 88.9 89.6  PLT 298 200 221 173 178    Cardiac Enzymes: Recent Labs  Lab 07/11/17 1515  TROPONINI 0.16*    BNP: Invalid input(s): POCBNP  CBG: No results for input(s): GLUCAP in the last 168 hours.  Microbiology: Results for orders placed or performed during the hospital encounter of 07/11/17  MRSA PCR Screening     Status: None   Collection Time: 07/11/17  8:25 PM  Result Value Ref Range Status   MRSA by PCR NEGATIVE NEGATIVE Final    Comment:        The GeneXpert MRSA Assay (FDA approved for NASAL specimens only), is one component of a comprehensive MRSA colonization surveillance program. It is not intended to diagnose MRSA infection nor to guide or monitor treatment for MRSA infections. Performed  at [facility], [address] [place], [place] [address]   C difficile quick scan w PCR reflex     Status: Abnormal   Collection Time: 07/13/17 12:20 AM  Result Value Ref Range Status   C Diff antigen POSITIVE (A) NEGATIVE Final   C Diff toxin NEGATIVE NEGATIVE Final   C Diff interpretation Results are indeterminate. See PCR results.  Final    Comment: Performed at [facility], [address] [place], [place] [address]  Gastrointestinal Panel by PCR , Stool     Status: None   Collection Time: 07/13/17 12:20 AM  Result Value Ref Range Status   Campylobacter species NOT DETECTED NOT DETECTED Final   Plesimonas shigelloides NOT DETECTED NOT DETECTED Final   Salmonella species NOT DETECTED NOT DETECTED Final   Yersinia enterocolitica NOT DETECTED NOT DETECTED Final   Vibrio species NOT DETECTED NOT DETECTED Final   Vibrio cholerae NOT  DETECTED NOT DETECTED Final   Enteroaggregative E coli (EAEC) NOT DETECTED NOT DETECTED Final   Enteropathogenic E coli (EPEC) NOT DETECTED NOT DETECTED Final   Enterotoxigenic E coli (ETEC) NOT DETECTED NOT DETECTED Final   Shiga like toxin producing E coli (STEC) NOT DETECTED NOT DETECTED Final   Shigella/Enteroinvasive E coli (EIEC) NOT DETECTED NOT DETECTED Final   Cryptosporidium NOT DETECTED NOT DETECTED Final   Cyclospora cayetanensis NOT DETECTED NOT DETECTED Final   Entamoeba histolytica NOT DETECTED NOT DETECTED Final   Giardia lamblia NOT DETECTED NOT DETECTED Final   Adenovirus F40/41 NOT DETECTED NOT DETECTED Final   Astrovirus NOT DETECTED NOT DETECTED Final   Norovirus GI/GII NOT DETECTED NOT DETECTED Final   Rotavirus A NOT DETECTED NOT DETECTED Final   Sapovirus (I, II, IV, and V) NOT DETECTED NOT DETECTED Final    Comment: Performed at [facility], [address] [place], [place] [address]  C. Diff by PCR, Reflexed     Status: Abnormal   Collection Time: 07/13/17 12:20 AM  Result Value Ref Range Status   Toxigenic C. Difficile by PCR POSITIVE (A) NEGATIVE Final    Comment: Positive for toxigenic C. difficile with little to no toxin production. Only treat if clinical presentation suggests symptomatic illness. Performed at [facility], [address] [place], [place] [address]     Coagulation Studies: No results for input(s): LABPROT, INR in the last 72 hours.  Urinalysis: No results for input(s): COLORURINE, LABSPEC, PHURINE, GLUCOSEU, HGBUR, BILIRUBINUR, KETONESUR, PROTEINUR, UROBILINOGEN, NITRITE, LEUKOCYTESUR in the last 72 hours.  Invalid input(s): APPERANCEUR    Imaging: No results found.   Medications:   . albumin human    . sodium chloride     . aspirin  81 mg Oral Daily  . calcium acetate  1,334 mg Oral TID WC  . dicyclomine  10 mg Oral TID AC  . docusate sodium  100 mg Oral BID  . feeding supplement (NEPRO CARB STEADY)   237 mL Oral BID BM  . heparin injection (subcutaneous)  5,000 Units Subcutaneous Q8H  . latanoprost  1 drop Both Eyes QHS  . lidocaine-prilocaine  1 application Topical UD  . megestrol  400 mg Oral BID  . metoCLOPramide  5 mg Oral TID AC & HS  . midodrine  10 mg Oral TID WC  . multivitamin  1 tablet Oral QHS  . multivitamin with minerals  1 tablet Oral Once per day on Mon Thu  . saccharomyces boulardii  250 mg Oral BID  .  sodium chloride flush  3 mL Intravenous Q12H  . vancomycin  125 mg Oral QID  . vitamin C  500 mg Oral BID     Assessment/ Plan:  Ms. Tristan SchroederCaroline J Boakye is a 67 y.o. black female with end stage renal disease on hemodialysis, anemia, hypertension, polycystic kidney disease, history of pacemaker, history of TIA, vertebral artery aneurysm status post coiling  MWF [facility] [address] Left AVG EDW 54.5kg  1. End-stage renal disease: with hyperkalemia, metabolic acidosis and uremic on admission.  Emergent hemodialysis on admission Resume MWF schedule. Treatment for tomorrow  2. Hypertension: Hypotensive.  Not currently taking any blood pressure agents.  - history of difficult to control. Home regimen of amlodipine, furosemide, and labetolol.  - Continue to hold medications.   3. Anemia chronic kidney disease:  Hemoglobin 10.8 - normal range.  - holding EPO  4. Secondary hyperparathyroidism: with hyperphosphatemia and hypocalcemia. Holding cinacalcet. Phosphorus and corrected calcium now at goal.  - calcium acetate with meals.   5. C. Diff colitis - PO vancomycin.   6. Ascites: schedule ultrasound guided paracentesis for tomorrow  1/14.     LOS: 5 Careem Yasui 1/13/201911:30 AM

## 2017-07-16 NOTE — Plan of Care (Signed)
Encourage high calorie frequent meals. Encourage ambulation and have patient up for meals in recliner while awake. Patient complains of dizziness while in the bed, follow all safety precautions.

## 2017-07-16 NOTE — Progress Notes (Signed)
[facility] - [place] [facility]   PATIENT NAME: Melissa PortelaCaroline Bradshaw    MR#:  [id number]  DATE OF BIRTH:  09/17/1950  SUBJECTIVE:  CHIEF COMPLAINT:   Chief Complaint  Patient presents with  . Weakness   Complains of persistent nausea and emesis, continued persistent hypotension noted REVIEW OF SYSTEMS:  CONSTITUTIONAL: No fever, fatigue or weakness.  EYES: No blurred or double vision.  EARS, NOSE, AND THROAT: No tinnitus or ear pain.  RESPIRATORY: No cough, shortness of breath, wheezing or hemoptysis.  CARDIOVASCULAR: No chest pain, orthopnea, edema.  GASTROINTESTINAL: No nausea, vomiting, diarrhea or abdominal pain.  GENITOURINARY: No dysuria, hematuria.  ENDOCRINE: No polyuria, nocturia,  HEMATOLOGY: No anemia, easy bruising or bleeding SKIN: No rash or lesion. MUSCULOSKELETAL: No joint pain or arthritis.   NEUROLOGIC: No tingling, numbness, weakness.  PSYCHIATRY: No anxiety or depression.   ROS  DRUG ALLERGIES:   Allergies  Allergen Reactions  . Hydralazine Hcl Other (See Comments)    Makes pt jittery, nervous, messes with vision   . Iodine Itching and Rash    Unknown reaction  . Ivp Dye [Iodinated Diagnostic Agents] Rash    VITALS:  Blood pressure 105/62, pulse 65, temperature 98.3 F (36.8 C), temperature source Oral, resp. rate 18, height 5\' 6"  (1.676 m), weight 46.2 kg (101 lb 14.4 oz), SpO2 100 %.  PHYSICAL EXAMINATION:  GENERAL:  67 y.o.-year-old patient lying in the bed with no acute distress.  Frail-appearing EYES: Pupils equal, round, reactive to light and accommodation. No scleral icterus. Extraocular muscles intact.  HEENT: Head atraumatic, normocephalic. Oropharynx and nasopharynx clear.  NECK:  Supple, no jugular venous distention. No thyroid enlargement, no tenderness.  LUNGS: Normal breath sounds bilaterally, no wheezing, rales,rhonchi or crepitation. No use of accessory muscles of respiration.  CARDIOVASCULAR: S1, S2 normal. No  murmurs, rubs, or gallops.  ABDOMEN: Soft, nontender, nondistended. Bowel sounds present. No organomegaly or mass.  EXTREMITIES: No pedal edema, cyanosis, or clubbing.  Diffuse muscular wasting NEUROLOGIC: Cranial nerves II through XII are intact. MAES. Gait not checked.  PSYCHIATRIC: The patient is alert and oriented x 3.  SKIN: No obvious rash, lesion, or ulcer.   Physical Exam LABORATORY PANEL:   CBC Recent Labs  Lab 07/15/17 0505  WBC 5.1  HGB 10.8*  HCT 33.2*  PLT 178   ------------------------------------------------------------------------------------------------------------------  Chemistries  Recent Labs  Lab 07/12/17 0428  07/15/17 0505  NA 140   < > 136  K 5.5*   < > 4.1  CL 97*   < > 99*  CO2 17*   < > 24  GLUCOSE 54*   < > 79  BUN 51*   < > 18  CREATININE 13.80*   < > 5.71*  CALCIUM 6.9*   < > 7.9*  AST 20  --   --   ALT 9*  --   --   ALKPHOS 57  --   --   BILITOT 1.9*  --   --    < > = values in this interval not displayed.   ------------------------------------------------------------------------------------------------------------------  Cardiac Enzymes Recent Labs  Lab 07/11/17 1515  TROPONINI 0.16*   ------------------------------------------------------------------------------------------------------------------  RADIOLOGY:  No results found.  ASSESSMENT AND PLAN:  Melissa Bradshaw a67 y.o.femalewith a known history of end-stage renal disease on hemodialysis on Tuesday Thursday and Saturday missed her hemodialysis for 1 week Patient reports that she was unable to drive as she was feeling weak and nobody could give her ride.Patient is reporting feeling  tired and drowsy. Also reports nauseous  1chronic ESRDwith clinical uremia Stable Receives dialysis on MWF Uremia has resolved Patient missed her hemodialysis for 1-2 week prior to admission Nephrology following for hemodialysis needs   2Hyperkalemia Resolved Did  receivesodium bicarb and calcium gluconate in the EDas well as hemodialysis  3acute persistent hypotension Most likely secondary to volume depletion and C. difficile colitis  Continue IV fluids for rehydration  Midodrine tid  4Generalized weakness with nausea and dry heaving Most likely secondary tochronicESRD and severe malnutrition Patient refused physical therapy Antiemetics PRN, schedule Reglan  5acute elevated troponin Most likely secondary to chronic kidney disease  6Severe malnutrition Dietitian consulted  7acute C. difficile colitis Resolving Continue vancomycin p.o. for 10-14-day course Lactinex 3 times daily  8 persistent nausea/emesis Check acute abdominal series and schedule IV Reglan  9 acute on chronic ascites For paracentesis on tomorrow  Condition stable Prognosis poor given noncompliance Discussed with Dr.Kolluru     All the records are reviewed and case discussed with Care Management/Social Workerr. Management plans discussed with the patient, family and they are in agreement.  CODE STATUS: dnr  TOTAL TIME TAKING CARE OF THIS PATIENT: 35 minutes.     POSSIBLE D/C IN 3-5 DAYS, DEPENDING ON CLINICAL CONDITION.   Melissa Bradshaw M.D on 07/16/2017   Between 7am to 6pm - Pager - [phone]  After 6pm go to [email or web address] - password [email or web address]  [facility]  Office  [phone]  CC: Primary care physician; Corky Downs, MD  Note: This dictation was prepared with Dragon dictation along with smaller phrase technology. Any transcriptional errors that result from this process are unintentional.

## 2017-07-17 ENCOUNTER — Inpatient Hospital Stay: Payer: Medicare Other

## 2017-07-17 LAB — CBC
HEMATOCRIT: 35.2 % (ref 35.0–47.0)
Hemoglobin: 11.6 g/dL — ABNORMAL LOW (ref 12.0–16.0)
MCH: 29.5 pg (ref 26.0–34.0)
MCHC: 33 g/dL (ref 32.0–36.0)
MCV: 89.4 fL (ref 80.0–100.0)
Platelets: 202 10*3/uL (ref 150–440)
RBC: 3.94 MIL/uL (ref 3.80–5.20)
RDW: 17.1 % — AB (ref 11.5–14.5)
WBC: 4.3 10*3/uL (ref 3.6–11.0)

## 2017-07-17 LAB — RENAL FUNCTION PANEL
ALBUMIN: 2.1 g/dL — AB (ref 3.5–5.0)
Anion gap: 14 (ref 5–15)
BUN: 31 mg/dL — ABNORMAL HIGH (ref 6–20)
CHLORIDE: 94 mmol/L — AB (ref 101–111)
CO2: 25 mmol/L (ref 22–32)
Calcium: 8.1 mg/dL — ABNORMAL LOW (ref 8.9–10.3)
Creatinine, Ser: 8.26 mg/dL — ABNORMAL HIGH (ref 0.44–1.00)
GFR calc Af Amer: 5 mL/min — ABNORMAL LOW (ref >60)
GFR, EST NON AFRICAN AMERICAN: 4 mL/min — AB (ref >60)
Glucose, Bld: 93 mg/dL (ref 65–99)
PHOSPHORUS: 5.2 mg/dL — AB (ref 2.5–4.6)
POTASSIUM: 4.4 mmol/L (ref 3.5–5.1)
Sodium: 133 mmol/L — ABNORMAL LOW (ref 135–145)

## 2017-07-17 MED ORDER — BOOST / RESOURCE BREEZE PO LIQD CUSTOM
1.0000 | Freq: Three times a day (TID) | ORAL | Status: DC
  Administered 2017-07-18: 1 via ORAL

## 2017-07-17 MED ORDER — ALBUMIN HUMAN 25 % IV SOLN
12.5000 g | Freq: Once | INTRAVENOUS | Status: DC
  Filled 2017-07-17: qty 50

## 2017-07-17 NOTE — Progress Notes (Signed)
[facility], [place] 07/17/17  Subjective:   Patient seen during HD, tolerating well   HEMODIALYSIS FLOWSHEET:  Blood Flow Rate (mL/min): 350 mL/min Arterial Pressure (mmHg): -220 mmHg Venous Pressure (mmHg): 110 mmHg Transmembrane Pressure (mmHg): 40 mmHg Ultrafiltration Rate (mL/min): 170 mL/min Dialysate Flow Rate (mL/min): 600 ml/min Conductivity: Machine : 14 Conductivity: Machine : 14 Dialysis Fluid Bolus: Normal Saline Bolus Amount (mL): 250 mL Dialysate Change: 2K  Abdomen is distended. Scheduled for paracentesis later today BP is low normal despite midodrine Last BM was on Friday Reports abdominal soreness but improved.   Objective:  Vital signs in last 24 hours:  Temp:  [97.2 F (36.2 C)-98.3 F (36.8 C)] 97.2 F (36.2 C) (01/14 0925) Pulse Rate:  [60-72] 65 (01/14 1115) Resp:  [17-18] 17 (01/14 0358) BP: (85-126)/(56-74) 100/71 (01/14 1115) SpO2:  [95 %-100 %] 99 % (01/14 1115) Weight:  [47 kg (103 lb 9.9 oz)-47.5 kg (104 lb 11.2 oz)] 47 kg (103 lb 9.9 oz) (01/14 0925)  Weight change: 1.27 kg (2 lb 12.8 oz) Filed Weights   07/16/17 0347 07/17/17 0500 07/17/17 0925  Weight: 46.2 kg (101 lb 14.4 oz) 47.5 kg (104 lb 11.2 oz) 47 kg (103 lb 9.9 oz)    Intake/Output:    Intake/Output Summary (Last 24 hours) at 07/17/2017 1132 Last data filed at 07/17/2017 0500 Gross per 24 hour  Intake -  Output 0 ml  Net 0 ml     Physical Exam: General: NAD, laying in bed  HEENT Moist oral mucus membranes  Neck supple  Pulm/lungs Normal breathing effort, decreased breath sounds at bases  CVS/Heart No rub or gallop  Abdomen:  Distended, umblical hernia, Ascites, no significant tenderness  Extremities: No edema  Neurologic: Alert, oriented  Skin: Dry skin  Access: Rt IJ PC, Left upper arm AVG - clotted       Basic Metabolic Panel:  Recent Labs  Lab 07/11/17 1515 07/12/17 0428 07/13/17 0954 07/15/17 0505 07/17/17 0810  NA 159*  140 140 136 133*  K 6.5* 5.5* 4.6 4.1 4.4  CL 96* 97* 96* 99* 94*  CO2 32 17* 22 24 25   GLUCOSE 68 54* 92 79 93  BUN 104* 51* 31* 18 31*  CREATININE 23.76* 13.80* 9.02* 5.71* 8.26*  CALCIUM 5.5* 6.9* 8.3* 7.9* 8.1*  PHOS  --  8.6* 7.7* 4.4 5.2*     CBC: Recent Labs  Lab 07/12/17 0428 07/13/17 0954 07/14/17 1117 07/15/17 0505 07/17/17 0810  WBC 4.4 4.8 4.0 5.1 4.3  HGB 12.0 11.9* 10.2* 10.8* 11.6*  HCT 35.7 35.6 30.5* 33.2* 35.2  MCV 88.3 88.8 88.9 89.6 89.4  PLT 200 221 173 178 202      Lab Results  Component Value Date   HEPBSAG Negative 07/11/2017   HEPBSAB Non Reactive 06/02/2016      Microbiology:  Recent Results (from the past 240 hour(s))  MRSA PCR Screening     Status: None   Collection Time: 07/11/17  8:25 PM  Result Value Ref Range Status   MRSA by PCR NEGATIVE NEGATIVE Final    Comment:        The GeneXpert MRSA Assay (FDA approved for NASAL specimens only), is one component of a comprehensive MRSA colonization surveillance program. It is not intended to diagnose MRSA infection nor to guide or monitor treatment for MRSA infections. Performed at [facility], [address], [place], [place] [address]   C difficile quick scan w PCR reflex  Status: Abnormal   Collection Time: 07/13/17 12:20 AM  Result Value Ref Range Status   C Diff antigen POSITIVE (A) NEGATIVE Final   C Diff toxin NEGATIVE NEGATIVE Final   C Diff interpretation Results are indeterminate. See PCR results.  Final    Comment: Performed at [facility], [address] [place], [place] [address]  Gastrointestinal Panel by PCR , Stool     Status: None   Collection Time: 07/13/17 12:20 AM  Result Value Ref Range Status   Campylobacter species NOT DETECTED NOT DETECTED Final   Plesimonas shigelloides NOT DETECTED NOT DETECTED Final   Salmonella species NOT DETECTED NOT DETECTED Final   Yersinia enterocolitica NOT DETECTED NOT DETECTED Final   Vibrio  species NOT DETECTED NOT DETECTED Final   Vibrio cholerae NOT DETECTED NOT DETECTED Final   Enteroaggregative E coli (EAEC) NOT DETECTED NOT DETECTED Final   Enteropathogenic E coli (EPEC) NOT DETECTED NOT DETECTED Final   Enterotoxigenic E coli (ETEC) NOT DETECTED NOT DETECTED Final   Shiga like toxin producing E coli (STEC) NOT DETECTED NOT DETECTED Final   Shigella/Enteroinvasive E coli (EIEC) NOT DETECTED NOT DETECTED Final   Cryptosporidium NOT DETECTED NOT DETECTED Final   Cyclospora cayetanensis NOT DETECTED NOT DETECTED Final   Entamoeba histolytica NOT DETECTED NOT DETECTED Final   Giardia lamblia NOT DETECTED NOT DETECTED Final   Adenovirus F40/41 NOT DETECTED NOT DETECTED Final   Astrovirus NOT DETECTED NOT DETECTED Final   Norovirus GI/GII NOT DETECTED NOT DETECTED Final   Rotavirus A NOT DETECTED NOT DETECTED Final   Sapovirus (I, II, IV, and V) NOT DETECTED NOT DETECTED Final    Comment: Performed at [facility], [address] [place], [place] [address]  C. Diff by PCR, Reflexed     Status: Abnormal   Collection Time: 07/13/17 12:20 AM  Result Value Ref Range Status   Toxigenic C. Difficile by PCR POSITIVE (A) NEGATIVE Final    Comment: Positive for toxigenic C. difficile with little to no toxin production. Only treat if clinical presentation suggests symptomatic illness. Performed at [facility], [address] [place], [place] [address]     Coagulation Studies: No results for input(s): LABPROT, INR in the last 72 hours.  Urinalysis: No results for input(s): COLORURINE, LABSPEC, PHURINE, GLUCOSEU, HGBUR, BILIRUBINUR, KETONESUR, PROTEINUR, UROBILINOGEN, NITRITE, LEUKOCYTESUR in the last 72 hours.  Invalid input(s): APPERANCEUR    Imaging: Dg Abd 2 Views  Result Date: 07/16/2017 CLINICAL DATA:  Emesis for 2-3 days. Chronic kidney disease. Chronic dialysis. EXAM: ABDOMEN - 2 VIEW COMPARISON:  CT abdomen 06/01/2017 FINDINGS: Visualized bowel  gas is primarily in the right displacement of bowel structures by the patient's known polycystic kidney disease and polycystic liver disease, currently there is gas in the right colon and some gas projecting the bowel is displaced by the patient's enlarged liver and kidneys; the patient has known polycystic liver disease and polycystic kidney disease. There is a hazy density along the abdomen quite likely indicating ascites, with the enlarged liver and kidneys likely prevent in the normal centralization of bowel loops that occurs in ascites. Curvilinear calcifications along both kidneys are chronically stable. Aortoiliac atherosclerotic vascular disease. Dialysis catheter tip projects over the right atrium. Subsegmental atelectasis or scarring along both hemidiaphragms. IMPRESSION: 1. No visualized dilated bowel. 2. Known polycystic liver and polycystic kidney disease, with the enlarged liver and kidneys displacing bowel peripherally and towards the pelvis. The relatively gasless appearance adversely affects negative predictive value.  3. Hazy opacity over the abdomen likely reflecting ascites. 4. Mild bibasilar atelectasis. 5.  Aortic Atherosclerosis (ICD10-I70.0). Electronically Signed   By: Gaylyn Rong M.D.   On: 07/16/2017 16:54     Medications:   . albumin human     . aspirin  81 mg Oral Daily  . calcium acetate  1,334 mg Oral TID WC  . dicyclomine  10 mg Oral TID AC  . docusate sodium  100 mg Oral BID  . feeding supplement  1 Container Oral TID BM  . heparin injection (subcutaneous)  5,000 Units Subcutaneous Q8H  . latanoprost  1 drop Both Eyes QHS  . lidocaine-prilocaine  1 application Topical UD  . megestrol  400 mg Oral BID  . metoCLOPramide (REGLAN) injection  10 mg Intravenous Q8H  . midodrine  10 mg Oral TID WC  . multivitamin  1 tablet Oral QHS  . multivitamin with minerals  1 tablet Oral Once per day on Mon Thu  . saccharomyces boulardii  250 mg Oral BID  . sodium chloride  flush  3 mL Intravenous Q12H  . vancomycin  125 mg Oral QID  . vitamin C  500 mg Oral BID   acetaminophen, acetaminophen-codeine, HYDROcodone-acetaminophen, ondansetron **OR** ondansetron (ZOFRAN) IV, polyethylene glycol  Assessment/ Plan:  68 y.o.African American female with end stage renal disease on hemodialysis, anemia, hypertension, polycystic kidney disease, history of pacemaker, history of TIA, vertebral artery aneurysm status post coiling  MWF [facility] [address] Left AVG, rt IJ PC EDW 54.5kg  1. ESRD 2. Anemia of CKD 3. SHPTH 4. Ascites 5. C Diff colitis  Continue HD MWF. Very limited fluid removal due to hypotension Continue midodrine for BP support Paracentesis today Calcium acetate for Phos binding Iv albumin for oncotic support Hgb 11.6, will monitor     LOS: 6 Melissa Bradshaw Thedore Mins 1/14/201911:32 AM  [facility] [place], [place] [phone]

## 2017-07-17 NOTE — Plan of Care (Signed)
  Progressing Health Behavior/Discharge Planning: Ability to manage health-related needs will improve 07/17/2017 2339 - Progressing by Kalman JewelsBallentine, Azarius Lambson, RN Clinical Measurements: Ability to maintain clinical measurements within normal limits will improve 07/17/2017 2339 - Progressing by Kalman JewelsBallentine, Kela Baccari, RN Will remain free from infection 07/17/2017 2339 - Progressing by Kalman JewelsBallentine, Desaray Marschner, RN Diagnostic test results will improve 07/17/2017 2339 - Progressing by Kalman JewelsBallentine, Pranish Akhavan, RN Respiratory complications will improve 07/17/2017 2339 - Progressing by Kalman JewelsBallentine, Miabella Shannahan, RN Cardiovascular complication will be avoided 07/17/2017 2339 - Progressing by Kalman JewelsBallentine, Rashmi Tallent, RN Activity: Risk for activity intolerance will decrease 07/17/2017 2339 - Progressing by Kalman JewelsBallentine, Darrah Dredge, RN Nutrition: Adequate nutrition will be maintained 07/17/2017 2339 - Progressing by Kalman JewelsBallentine, Tiaira Arambula, RN Elimination: Will not experience complications related to bowel motility 07/17/2017 2339 - Progressing by Kalman JewelsBallentine, Lana Flaim, RN Will not experience complications related to urinary retention 07/17/2017 2339 - Progressing by Kalman JewelsBallentine, Carlis Blanchard, RN Pain Managment: General experience of comfort will improve 07/17/2017 2339 - Progressing by Kalman JewelsBallentine, Tiffanny Lamarche, RN Skin Integrity: Risk for impaired skin integrity will decrease 07/17/2017 2339 - Progressing by Kalman JewelsBallentine, Bowen Goyal, RN Activity: Ability to tolerate increased activity will improve 07/17/2017 2339 - Progressing by Kalman JewelsBallentine, Marshelle Bilger, RN Education: Knowledge of disease and its progression will improve 07/17/2017 2339 - Progressing by Kalman JewelsBallentine, Brittinie Wherley, RN Fluid Volume: Compliance with measures to maintain balanced fluid volume will improve 07/17/2017 2339 - Progressing by Kalman JewelsBallentine, Aizza Santiago, RN Health Behavior/Discharge Planning: Ability to manage health-related needs will improve 07/17/2017 2339 - Progressing by Kalman JewelsBallentine,  Jericho Cieslik, RN Nutritional: Ability to make healthy dietary choices will improve 07/17/2017 2339 - Progressing by Kalman JewelsBallentine, Zakari Couchman, RN Physical Regulation: Complications related to the disease process, condition or treatment will be avoided or minimized 07/17/2017 2339 - Progressing by Kalman JewelsBallentine, Zaley Talley, RN

## 2017-07-17 NOTE — Progress Notes (Signed)
Nutrition Follow Up Note   DOCUMENTATION CODES:   Severe malnutrition in context of chronic illness  INTERVENTION:   Discontinue Nepro Shake po BID, each supplement provides 425 kcal and 19 grams protein  Add Boost Breeze po TID, each supplement provides 250 kcal and 9 grams of protein  Add Vital Cuisine BID, each supplement provides 520kcal and 22g of protein.   Liberalize diet   MVI twice weekly  Rena-vite daily  Magic cup TID with meals, each supplement provides 290 kcal and 9 grams of protein  Vitamin C 500 mg po BID  NUTRITION DIAGNOSIS:   Severe Malnutrition related to catabolic illness(ESRD on HD) as evidenced by severe fat depletion, severe muscle depletion.  GOAL:   Patient will meet greater than or equal to 90% of their needs  -not meeting   MONITOR:   PO intake, Supplement acceptance, Weight trends, Labs, I & O's  ASSESSMENT:   67 y.o. female with a known history of end-stage renal disease on hemodialysis on Tuesday Thursday and Saturday missed her hemodialysis for 1 week and admitted for uremia    Pt with poor appetite and oral intake since admit; eating <25% of meals. Pt also with continued nausea. Appetite improved today. Pt refusing Nepro supplements; RD will switch to [employer] Breeze and add Vital Cuisine. Per palliative care note, pt would not want feeding tube. Per chart, pt is weight stable since admit. Pt with ascites; scheduled to have paracentesis today. Pt with likely scurvy on vitamin C supplementation. Pt with C-diff and secondary diarrhea. Per MD note, uremia resolved. Pt initiated on megace. RD will liberalize diet and change supplements to encourage increased po intake.       Medications reviewed and include: aspirin, phoslo, colace, heparin, megace, reglan, Rena-vite, MVI, florastor, vancomycin, vitamin C, albumin   Labs reviewed: Na 133(L), Cl 94(L), BUN 31(H), creat 8.26(H), Ca 8.1(L) adj. 9.62 wnl, P 5.2(H), alb 2.1(L) Uric acid- 13.5(H)-  1/8  Diet Order:  Diet renal with fluid restriction Fluid restriction: 1200 mL Fluid; Room service appropriate? Yes; Fluid consistency: Thin  EDUCATION NEEDS:   Education needs have been addressed  Skin:  Reviewed RN Assessment  Last BM:  1/12- type 7  Height:   Ht Readings from Last 1 Encounters:  07/11/17 5\' 6"  (1.676 m)    Weight:   Wt Readings from Last 1 Encounters:  07/17/17 103 lb 9.9 oz (47 kg)    Ideal Body Weight:  59 kg  BMI:  Body mass index is 16.72 kg/m.  Estimated Nutritional Needs:   Kcal:  1300-1500kcal/day   Protein:  73-82g/day   Fluid:  >1.3L/day   Betsey Holidayasey Aarsh Fristoe MS, RD, LDN Pager #[phone]- [phone] After Hours Pager: [phone]

## 2017-07-17 NOTE — Care Management (Signed)
Patient is agreeable to home health physical therapy "if they let me know when they are coming." Agency preference is [facility]. Referral called and accepted.  patient declines needs for home health nurse. Anticipate discharge within next 24 hours

## 2017-07-17 NOTE — Plan of Care (Signed)
Patient's oral intake has improved slightly today. Patient is more alert and is communicating more with staff. Continue to encourage patient to increase intake.

## 2017-07-17 NOTE — Progress Notes (Signed)
[facility] [place] [facility]   PATIENT NAME: Melissa Bradshaw    MR#:  [id number]  DATE OF BIRTH:  06/14/1951  SUBJECTIVE:  CHIEF COMPLAINT:   Chief Complaint  Patient presents with  . Weakness  Patient feeling slightly better today, currently receiving hemodialysis, for paracentesis later today  REVIEW OF SYSTEMS:  CONSTITUTIONAL: No fever, fatigue or weakness.  EYES: No blurred or double vision.  EARS, NOSE, AND THROAT: No tinnitus or ear pain.  RESPIRATORY: No cough, shortness of breath, wheezing or hemoptysis.  CARDIOVASCULAR: No chest pain, orthopnea, edema.  GASTROINTESTINAL: No nausea, vomiting, diarrhea or abdominal pain.  GENITOURINARY: No dysuria, hematuria.  ENDOCRINE: No polyuria, nocturia,  HEMATOLOGY: No anemia, easy bruising or bleeding SKIN: No rash or lesion. MUSCULOSKELETAL: No joint pain or arthritis.   NEUROLOGIC: No tingling, numbness, weakness.  PSYCHIATRY: No anxiety or depression.   ROS  DRUG ALLERGIES:   Allergies  Allergen Reactions  . Hydralazine Hcl Other (See Comments)    Makes pt jittery, nervous, messes with vision   . Iodine Itching and Rash    Unknown reaction  . Ivp Dye [Iodinated Diagnostic Agents] Rash    VITALS:  Blood pressure 115/64, pulse 62, temperature (!) 97.2 F (36.2 C), temperature source Oral, resp. rate 17, height 5\' 6"  (1.676 m), weight 47 kg (103 lb 9.9 oz), SpO2 100 %.  PHYSICAL EXAMINATION:  GENERAL:  67 y.o.-year-old patient lying in the bed with no acute distress.  EYES: Pupils equal, round, reactive to light and accommodation. No scleral icterus. Extraocular muscles intact.  HEENT: Head atraumatic, normocephalic. Oropharynx and nasopharynx clear.  NECK:  Supple, no jugular venous distention. No thyroid enlargement, no tenderness.  LUNGS: Normal breath sounds bilaterally, no wheezing, rales,rhonchi or crepitation. No use of accessory muscles of respiration.  CARDIOVASCULAR: S1, S2 normal. No  murmurs, rubs, or gallops.  ABDOMEN: Soft, nontender, nondistended. Bowel sounds present. No organomegaly or mass.  EXTREMITIES: No pedal edema, cyanosis, or clubbing.  NEUROLOGIC: Cranial nerves II through XII are intact. Muscle strength 5/5 in all extremities. Sensation intact. Gait not checked.  PSYCHIATRIC: The patient is alert and oriented x 3.  SKIN: No obvious rash, lesion, or ulcer.   Physical Exam LABORATORY PANEL:   CBC Recent Labs  Lab 07/17/17 0810  WBC 4.3  HGB 11.6*  HCT 35.2  PLT 202   ------------------------------------------------------------------------------------------------------------------  Chemistries  Recent Labs  Lab 07/12/17 0428  07/17/17 0810  NA 140   < > 133*  K 5.5*   < > 4.4  CL 97*   < > 94*  CO2 17*   < > 25  GLUCOSE 54*   < > 93  BUN 51*   < > 31*  CREATININE 13.80*   < > 8.26*  CALCIUM 6.9*   < > 8.1*  AST 20  --   --   ALT 9*  --   --   ALKPHOS 57  --   --   BILITOT 1.9*  --   --    < > = values in this interval not displayed.   ------------------------------------------------------------------------------------------------------------------  Cardiac Enzymes Recent Labs  Lab 07/11/17 1515  TROPONINI 0.16*   ------------------------------------------------------------------------------------------------------------------  RADIOLOGY:  Dg Abd 2 Views  Result Date: 07/16/2017 CLINICAL DATA:  Emesis for 2-3 days. Chronic kidney disease. Chronic dialysis. EXAM: ABDOMEN - 2 VIEW COMPARISON:  CT abdomen 06/01/2017 FINDINGS: Visualized bowel gas is primarily in the right displacement of bowel structures by the patient's known  polycystic kidney disease and polycystic liver disease, currently there is gas in the right colon and some gas projecting the bowel is displaced by the patient's enlarged liver and kidneys; the patient has known polycystic liver disease and polycystic kidney disease. There is a hazy density along the abdomen  quite likely indicating ascites, with the enlarged liver and kidneys likely prevent in the normal centralization of bowel loops that occurs in ascites. Curvilinear calcifications along both kidneys are chronically stable. Aortoiliac atherosclerotic vascular disease. Dialysis catheter tip projects over the right atrium. Subsegmental atelectasis or scarring along both hemidiaphragms. IMPRESSION: 1. No visualized dilated bowel. 2. Known polycystic liver and polycystic kidney disease, with the enlarged liver and kidneys displacing bowel peripherally and towards the pelvis. The relatively gasless appearance adversely affects negative predictive value. 3. Hazy opacity over the abdomen likely reflecting ascites. 4. Mild bibasilar atelectasis. 5.  Aortic Atherosclerosis (ICD10-I70.0). Electronically Signed   By: Gaylyn Rong M.D.   On: 07/16/2017 16:54    ASSESSMENT AND PLAN:  Melissa Bradshaw a67 y.o.femalewith a known history of end-stage renal disease on hemodialysis on Tuesday Thursday and Saturday missed her hemodialysis for 1 week Patient reports that she was unable to drive as she was feeling weak and nobody could give her ride.Patient is reporting feeling tired and drowsy. Also reports nauseous  1chronic ESRDwith clinical uremia Stable Receives dialysis on MWF Uremia has resolved Patient missed her hemodialysis for 1-2week prior to admission Nephrology following for hemodialysis needs   2Hyperkalemia Resolved Did receivesodium bicarb and calcium gluconate in the EDas well as hemodialysis  3acutepersistent hypotension Resolving slowly Most likely secondary to volume depletion and C. difficile colitis  Continue Midodrine tid  4Generalized weakness with nausea and dry heaving Most likely secondary tochronicESRD and severe malnutrition Patient refused physical therapy Antiemetics PRN, schedule Reglan  5acute elevated troponin Most likely secondary to  chronic kidney disease  6Severe malnutrition Dietitian consulted  7acute C. difficile colitis Resolving Continue vancomycin p.o. for 10-14-day course Lactinex 3 times daily  8 persistent nausea/emesis Acute abdominal series unimpressive  Continue scheduled Reglan and antiemetics PRN   9 acute on chronic ascites For paracentesis later today   Condition stable Prognosis poor given noncompliance  All the records are reviewed and case discussed with Care Management/Social Workerr. Management plans discussed with the patient, family and they are in agreement.  CODE STATUS: dnr  TOTAL TIME TAKING CARE OF THIS PATIENT:35 minutes.     POSSIBLE D/C IN 1-3 DAYS, DEPENDING ON CLINICAL CONDITION.   Evelena Asa Keshayla Schrum M.D on 07/17/2017   Between 7am to 6pm - Pager - [phone]  After 6pm go to [email or web address] - password [email or web address]  [facility]  Office  [phone]  CC: Primary care physician; Corky Downs, MD  Note: This dictation was prepared with Dragon dictation along with smaller phrase technology. Any transcriptional errors that result from this process are unintentional.

## 2017-07-17 NOTE — Progress Notes (Signed)
Physical Therapy Treatment Patient Details Name: Melissa SchroederCaroline J Bradshaw MRN: [id number] DOB: 10/10/1950 Today's Date: 07/17/2017    History of Present Illness Pt admitted for fluid overload after c/o general weakness.  PMH includes pacemaker implantation, Htn, HA, glaucoma, stomach ulcer and CKD.    PT Comments    Pt agreeable to PT; reports 7/10 pain in abdomen (chronic x 8 months). No dizziness with sit/stand. Demonstrating improved ambulation distance/tolerance this session with rolling walker and supervision. Occasional scissor steps around bends without loss of balance. Heart rate increases from mid 90's to fluctuating in 100's up to 112 beats per minute; occasional mild rest periods. Pt received up in chair comfortably. Continue PT to progress strength and endurance to improve all functional mobility and allow for an optimal, safe return home.     Follow Up Recommendations  Home health PT     Equipment Recommendations       Recommendations for Other Services       Precautions / Restrictions Precautions Precautions: Fall Restrictions Weight Bearing Restrictions: No    Mobility  Bed Mobility Overal bed mobility: Independent                Transfers Overall transfer level: Modified independent Equipment used: Rolling walker (2 wheeled)             General transfer comment: No issues  Ambulation/Gait Ambulation/Gait assistance: Supervision Ambulation Distance (Feet): 215 Feet Assistive device: Rolling walker (2 wheeled) Gait Pattern/deviations: Step-through pattern;Narrow base of support   Gait velocity interpretation: <1.8 ft/sec, indicative of risk for recurrent falls(1.5 ft/sec) General Gait Details: slow, steady gait. Occasional scissoring on bends, but no LOB   Stairs            Wheelchair Mobility    Modified Rankin (Stroke Patients Only)       Balance Overall balance assessment: Needs assistance Sitting-balance support: Feet  supported Sitting balance-Leahy Scale: Good     Standing balance support: Bilateral upper extremity supported Standing balance-Leahy Scale: Fair                              Cognition Arousal/Alertness: Awake/alert Behavior During Therapy: WFL for tasks assessed/performed Overall Cognitive Status: Within Functional Limits for tasks assessed                                        Exercises      General Comments        Pertinent Vitals/Pain Pain Assessment: 0-10 Pain Score: 7  Pain Location: Stomach Pain Intervention(s): Monitored during session    Home Living                      Prior Function            PT Goals (current goals can now be found in the care plan section) Progress towards PT goals: Progressing toward goals    Frequency    Min 2X/week      PT Plan Current plan remains appropriate    Co-evaluation              AM-PAC PT "6 Clicks" Daily Activity  Outcome Measure  Difficulty turning over in bed (including adjusting bedclothes, sheets and blankets)?: None Difficulty moving from lying on back to sitting on the side of the bed? : None Difficulty sitting down  on and standing up from a chair with arms (e.g., wheelchair, bedside commode, etc,.)?: None Help needed moving to and from a bed to chair (including a wheelchair)?: A Little Help needed walking in hospital room?: A Little Help needed climbing 3-5 steps with a railing? : A Little 6 Click Score: 21    End of Session   Activity Tolerance: Patient tolerated treatment well Patient left: in chair;with chair alarm set;with call bell/phone within reach   PT Visit Diagnosis: Unsteadiness on feet (R26.81);Muscle weakness (generalized) (M62.81)     Time: 1610-9604 PT Time Calculation (min) (ACUTE ONLY): 15 min  Charges:  $Gait Training: 8-22 mins                    G Codes:        Scot Dock, PTA 07/17/2017, 5:22 PM

## 2017-07-17 NOTE — Progress Notes (Signed)
Pre dialysis assessment 

## 2017-07-18 ENCOUNTER — Inpatient Hospital Stay: Payer: Medicare Other

## 2017-07-18 ENCOUNTER — Ambulatory Visit: Payer: Medicare Other

## 2017-07-18 LAB — CREATININE, SERUM
Creatinine, Ser: 5.25 mg/dL — ABNORMAL HIGH (ref 0.44–1.00)
GFR calc Af Amer: 9 mL/min — ABNORMAL LOW (ref >60)
GFR, EST NON AFRICAN AMERICAN: 8 mL/min — AB (ref >60)

## 2017-07-18 MED ORDER — MIDODRINE HCL 10 MG PO TABS: 10.0000 mg | ORAL_TABLET | Freq: Three times a day (TID) | ORAL | 11 refills | Status: AC

## 2017-07-18 MED ORDER — ONDANSETRON HCL 4 MG PO TABS: 4.0000 mg | ORAL_TABLET | Freq: Four times a day (QID) | ORAL | 2 refills | Status: DC | PRN

## 2017-07-18 MED ORDER — RENA-VITE PO TABS: 1.0000 | ORAL_TABLET | Freq: Every day | ORAL | 11 refills | Status: AC

## 2017-07-18 MED ORDER — VANCOMYCIN HCL 250 MG PO CAPS: 250.0000 mg | ORAL_CAPSULE | Freq: Four times a day (QID) | ORAL | 0 refills | Status: DC

## 2017-07-18 MED ORDER — DICYCLOMINE HCL 10 MG PO CAPS: 10.0000 mg | ORAL_CAPSULE | Freq: Three times a day (TID) | ORAL | 1 refills | Status: AC

## 2017-07-18 MED ORDER — MEGESTROL ACETATE 400 MG/10ML PO SUSP: 400.0000 mg | Freq: Two times a day (BID) | ORAL | 11 refills | Status: AC

## 2017-07-18 MED ORDER — SACCHAROMYCES BOULARDII 250 MG PO CAPS: 250.0000 mg | ORAL_CAPSULE | Freq: Two times a day (BID) | ORAL | 0 refills | Status: DC

## 2017-07-18 NOTE — Care Management Important Message (Signed)
Important Message  Patient Details  Name: Melissa Bradshaw MRN: [id number] Date of Birth: 11/21/1950   Medicare Important Message Given:  Yes    Eber HongGreene, Laneshia Pina R, RN 07/18/2017, 11:58 AM

## 2017-07-18 NOTE — Discharge Summary (Signed)
[facility] [place] [facility]   PATIENT NAME: Melissa Bradshaw    MR#:  [id number]  DATE OF BIRTH:  07-17-1950  DATE OF ADMISSION:  07/11/2017 ADMITTING PHYSICIAN: Ramonita Lab, MD  DATE OF DISCHARGE: No discharge date for patient encounter.  PRIMARY CARE PHYSICIAN: Corky Downs, MD    ADMISSION DIAGNOSIS:  End stage renal disease (HCC) [N18.6] SOB (shortness of breath) [R06.02] Generalized weakness [R53.1]  DISCHARGE DIAGNOSIS:  Active Problems:   Fluid overload   SECONDARY DIAGNOSIS:   Past Medical History:  Diagnosis Date  . Anemia   . Aneurysm (HCC)    Cerebral, [place]  . Arthritis   . Cardiac pacemaker   . CHF (congestive heart failure) (HCC)   . Chronic kidney disease    ESRD  . Constipation   . Coronary artery disease   . Dialysis patient (HCC)    Tues, Thurs, Sat  . Gastric ulcer   . Glaucoma (increased eye pressure)   . Headache   . Hypertension   . Presence of permanent cardiac pacemaker     HOSPITAL COURSE:  [name] a67 y.o.femalewith a known history of end-stage renal disease on hemodialysis on Tuesday Thursday and Saturday missed her hemodialysis for 1 week Patient reports that she was unable to drive as she was feeling weak and nobody could give her ride.Patient is reporting feeling tired and drowsy. Also reports nauseous  1chronicESRDwith clinical uremia Stable Receives dialysis on MWF Uremia has resolved Patient missed her hemodialysis for 1-2weekprior to admission Nephrology did see patient while in house for hemodialysis needs, received hemodialysis on day prior to discharge    2acute C. difficile colitis Resolving Treated with p.o. vancomycin-day course  3 acute hyperkalemia  Resolved Did receivesodium bicarb, calcium gluconate, and HD  4acutepersistent hypotension Resolved Most likely secondary to volume depletion and C. difficile colitis  Treated with  IV fluids for rehydration, antihypertensives discontinued, and patient started on Midodrinetid  5acute on chronic generalized weakness with nausea and dry heaving Resolved Most likely secondary tochronicESRDand severe malnutrition Evaluated by physical therapy-recommended home health PT, provided antiemetics PRN, and placed on scheduled Reglan  6acute elevated troponin Most likely secondary to chronic kidney disease  7 acute on chronic severe protein calorie/energy malnutrition Dietitian consulted while in house  8 acute persistent nausea/emesis Acute abdominal series unimpressive  Resolved on scheduled Reglan and antiemetics PRN   9acute on chronic ascites Stable To follow-up with IR later this week for possible paracentesis   DISCHARGE CONDITIONS:   On day of discharge patient is afebrile, stable, tolerating diet, ready for discharge home with follow-up with primary care provider and nephrology as directed, for more specific details please see chart  CONSULTS OBTAINED:  Treatment Team:  Lamont Dowdy, MD  DRUG ALLERGIES:   Allergies  Allergen Reactions  . Hydralazine Hcl Other (See Comments)    Makes pt jittery, nervous, messes with vision   . Iodine Itching and Rash    Unknown reaction  . Ivp Dye [Iodinated Diagnostic Agents] Rash    DISCHARGE MEDICATIONS:   Allergies as of 07/18/2017      Reactions   Hydralazine Hcl Other (See Comments)   Makes pt jittery, nervous, messes with vision    Iodine Itching, Rash   Unknown reaction   Ivp Dye [iodinated Diagnostic Agents] Rash      Medication List    STOP taking these medications   amLODipine 10 MG tablet Commonly known as:  NORVASC  furosemide 20 MG tablet Commonly known as:  LASIX     TAKE these medications   aspirin 81 MG tablet Take 81 mg by mouth daily.   calcium acetate 667 MG capsule Commonly known as:  PHOSLO Take 1,334 mg by mouth 3 (three) times daily with meals.    dicyclomine 10 MG capsule Commonly known as:  BENTYL Take 1 capsule (10 mg total) by mouth 3 (three) times daily before meals.   docusate sodium 100 MG capsule Commonly known as:  COLACE Take 1 capsule (100 mg total) by mouth 2 (two) times daily. What changed:  when to take this   latanoprost 0.005 % ophthalmic solution Commonly known as:  XALATAN Place 1 drop into both eyes at bedtime.   lidocaine-prilocaine cream Commonly known as:  EMLA Apply 1 application topically as directed.   megestrol 400 MG/10ML suspension Commonly known as:  MEGACE Take 10 mLs (400 mg total) by mouth 2 (two) times daily.   midodrine 10 MG tablet Commonly known as:  PROAMATINE Take 1 tablet (10 mg total) by mouth 3 (three) times daily with meals.   multivitamin Tabs tablet Take 1 tablet by mouth at bedtime.   ondansetron 4 MG disintegrating tablet Commonly known as:  ZOFRAN ODT Take 1 tablet (4 mg total) by mouth every 8 (eight) hours as needed for nausea or vomiting.   saccharomyces boulardii 250 MG capsule Commonly known as:  FLORASTOR Take 1 capsule (250 mg total) by mouth 2 (two) times daily.   vancomycin 250 MG capsule Commonly known as:  VANCOCIN Take 1 capsule (250 mg total) by mouth 4 (four) times daily.        DISCHARGE INSTRUCTIONS:    If you experience worsening of your admission symptoms, develop shortness of breath, life threatening emergency, suicidal or homicidal thoughts you must seek medical attention immediately by calling 911 or calling your MD immediately  if symptoms less severe.  You Must read complete instructions/literature along with all the possible adverse reactions/side effects for all the Medicines you take and that have been prescribed to you. Take any new Medicines after you have completely understood and accept all the possible adverse reactions/side effects.   Please note  You were cared for by a hospitalist during your hospital stay. If you have any  questions about your discharge medications or the care you received while you were in the hospital after you are discharged, you can call the unit and asked to speak with the hospitalist on call if the hospitalist that took care of you is not available. Once you are discharged, your primary care physician will handle any further medical issues. Please note that NO REFILLS for any discharge medications will be authorized once you are discharged, as it is imperative that you return to your primary care physician (or establish a relationship with a primary care physician if you do not have one) for your aftercare needs so that they can reassess your need for medications and monitor your lab values.    Today   CHIEF COMPLAINT:   Chief Complaint  Patient presents with  . Weakness    HISTORY OF PRESENT ILLNESS:  Melissa Bradshaw  is a 67 y.o. female with a known history of end-stage renal disease on hemodialysis on Tuesday Thursday and Saturday missed her hemodialysis for 1 week Patient reports that she was unable to drive as she was feeling weak and nobody could give her ride.  Patient is reporting feeling tired and drowsy.  Also reports nauseous fell asleep in triage in the ED. patient was given calcium gluconate and sodium bicarbonate in the ED for anticipated hyperkalemia.  Troponin is elevated according to the lab, BMP still pending at this time patient denies any chest pain or shortness of breath.     VITAL SIGNS:  Blood pressure 92/61, pulse 61, temperature 98.5 F (36.9 C), temperature source Oral, resp. rate 12, height 5\' 6"  (1.676 m), weight 47.9 kg (105 lb 8 oz), SpO2 96 %.  I/O:    Intake/Output Summary (Last 24 hours) at 07/18/2017 1116 Last data filed at 07/18/2017 0326 Gross per 24 hour  Intake -  Output 21 ml  Net -21 ml    PHYSICAL EXAMINATION:  GENERAL:  67 y.o.-year-old patient lying in the bed with no acute distress.  EYES: Pupils equal, round, reactive to light and  accommodation. No scleral icterus. Extraocular muscles intact.  HEENT: Head atraumatic, normocephalic. Oropharynx and nasopharynx clear.  NECK:  Supple, no jugular venous distention. No thyroid enlargement, no tenderness.  LUNGS: Normal breath sounds bilaterally, no wheezing, rales,rhonchi or crepitation. No use of accessory muscles of respiration.  CARDIOVASCULAR: S1, S2 normal. No murmurs, rubs, or gallops.  ABDOMEN: Soft, non-tender, non-distended. Bowel sounds present. No organomegaly or mass.  EXTREMITIES: No pedal edema, cyanosis, or clubbing.  NEUROLOGIC: Cranial nerves II through XII are intact. Muscle strength 5/5 in all extremities. Sensation intact. Gait not checked.  PSYCHIATRIC: The patient is alert and oriented x 3.  SKIN: No obvious rash, lesion, or ulcer.   DATA REVIEW:   CBC Recent Labs  Lab 07/17/17 0810  WBC 4.3  HGB 11.6*  HCT 35.2  PLT 202    Chemistries  Recent Labs  Lab 07/12/17 0428  07/17/17 0810 07/18/17 0521  NA 140   < > 133*  --   K 5.5*   < > 4.4  --   CL 97*   < > 94*  --   CO2 17*   < > 25  --   GLUCOSE 54*   < > 93  --   BUN 51*   < > 31*  --   CREATININE 13.80*   < > 8.26* 5.25*  CALCIUM 6.9*   < > 8.1*  --   AST 20  --   --   --   ALT 9*  --   --   --   ALKPHOS 57  --   --   --   BILITOT 1.9*  --   --   --    < > = values in this interval not displayed.    Cardiac Enzymes Recent Labs  Lab 07/11/17 1515  TROPONINI 0.16*    Microbiology Results  Results for orders placed or performed during the hospital encounter of 07/11/17  MRSA PCR Screening     Status: None   Collection Time: 07/11/17  8:25 PM  Result Value Ref Range Status   MRSA by PCR NEGATIVE NEGATIVE Final    Comment:        The GeneXpert MRSA Assay (FDA approved for NASAL specimens only), is one component of a comprehensive MRSA colonization surveillance program. It is not intended to diagnose MRSA infection nor to guide or monitor treatment for MRSA  infections. Performed at [facility], [address] [place], [place] [address]   C difficile quick scan w PCR reflex     Status: Abnormal   Collection Time: 07/13/17 12:20 AM  Result Value Ref  Range Status   C Diff antigen POSITIVE (A) NEGATIVE Final   C Diff toxin NEGATIVE NEGATIVE Final   C Diff interpretation Results are indeterminate. See PCR results.  Final    Comment: Performed at [facility], [address] [place], [place] [address]  Gastrointestinal Panel by PCR , Stool     Status: None   Collection Time: 07/13/17 12:20 AM  Result Value Ref Range Status   Campylobacter species NOT DETECTED NOT DETECTED Final   Plesimonas shigelloides NOT DETECTED NOT DETECTED Final   Salmonella species NOT DETECTED NOT DETECTED Final   Yersinia enterocolitica NOT DETECTED NOT DETECTED Final   Vibrio species NOT DETECTED NOT DETECTED Final   Vibrio cholerae NOT DETECTED NOT DETECTED Final   Enteroaggregative E coli (EAEC) NOT DETECTED NOT DETECTED Final   Enteropathogenic E coli (EPEC) NOT DETECTED NOT DETECTED Final   Enterotoxigenic E coli (ETEC) NOT DETECTED NOT DETECTED Final   Shiga like toxin producing E coli (STEC) NOT DETECTED NOT DETECTED Final   Shigella/Enteroinvasive E coli (EIEC) NOT DETECTED NOT DETECTED Final   Cryptosporidium NOT DETECTED NOT DETECTED Final   Cyclospora cayetanensis NOT DETECTED NOT DETECTED Final   Entamoeba histolytica NOT DETECTED NOT DETECTED Final   Giardia lamblia NOT DETECTED NOT DETECTED Final   Adenovirus F40/41 NOT DETECTED NOT DETECTED Final   Astrovirus NOT DETECTED NOT DETECTED Final   Norovirus GI/GII NOT DETECTED NOT DETECTED Final   Rotavirus A NOT DETECTED NOT DETECTED Final   Sapovirus (I, II, IV, and V) NOT DETECTED NOT DETECTED Final    Comment: Performed at [facility], [address] [place], [place] [address]  C. Diff by PCR, Reflexed     Status: Abnormal   Collection Time: 07/13/17 12:20 AM   Result Value Ref Range Status   Toxigenic C. Difficile by PCR POSITIVE (A) NEGATIVE Final    Comment: Positive for toxigenic C. difficile with little to no toxin production. Only treat if clinical presentation suggests symptomatic illness. Performed at [facility], [address], [place], [place] [address]     RADIOLOGY:  US Abdomen Limited  Result Date: 07/18/2017 CLINICAL DATA:  Polycystic disease involving kidneys in liver. Recurrent abdominal ascites and abdominal distention. EXAM: LIMITED ABDOMEN ULTRASOUND FOR ASCITES TECHNIQUE: Limited ultrasound survey for ascites was performed in all four abdominal quadrants. COMPARISON:  07/05/2017 FINDINGS: There is a small amount of scattered ascites in the lower abdomen right greater than left. No significant large pocket is identified for large volume therapeutic paracentesis, which was therefore deferred. IMPRESSION: 1. Small amount of residual/recurrent abdominal ascites. No large pocket for large volume therapeutic paracentesis. Electronically Signed   By: Corlis Leak M.D.   On: 07/18/2017 10:58   Dg Abd 2 Views  Result Date: 07/16/2017 CLINICAL DATA:  Emesis for 2-3 days. Chronic kidney disease. Chronic dialysis. EXAM: ABDOMEN - 2 VIEW COMPARISON:  CT abdomen 06/01/2017 FINDINGS: Visualized bowel gas is primarily in the right displacement of bowel structures by the patient's known polycystic kidney disease and polycystic liver disease, currently there is gas in the right colon and some gas projecting the bowel is displaced by the patient's enlarged liver and kidneys; the patient has known polycystic liver disease and polycystic kidney disease. There is a hazy density along the abdomen quite likely indicating ascites, with the enlarged liver and kidneys likely prevent in the normal centralization of bowel loops that occurs in ascites. Curvilinear calcifications along both kidneys are chronically stable. Aortoiliac atherosclerotic  vascular  disease. Dialysis catheter tip projects over the right atrium. Subsegmental atelectasis or scarring along both hemidiaphragms. IMPRESSION: 1. No visualized dilated bowel. 2. Known polycystic liver and polycystic kidney disease, with the enlarged liver and kidneys displacing bowel peripherally and towards the pelvis. The relatively gasless appearance adversely affects negative predictive value. 3. Hazy opacity over the abdomen likely reflecting ascites. 4. Mild bibasilar atelectasis. 5.  Aortic Atherosclerosis (ICD10-I70.0). Electronically Signed   By: Gaylyn Rong M.D.   On: 07/16/2017 16:54    EKG:   Orders placed or performed during the hospital encounter of 07/11/17  . ED EKG  . ED EKG      Management plans discussed with the patient, family and they are in agreement.  CODE STATUS:     Code Status Orders  (From admission, onward)        Start     Ordered   07/12/17 1206  Do not attempt resuscitation (DNR)  Continuous    Question Answer Comment  In the event of cardiac or respiratory ARREST Do not call a "code blue"   In the event of cardiac or respiratory ARREST Do not perform Intubation, CPR, defibrillation or ACLS   In the event of cardiac or respiratory ARREST Use medication by any route, position, wound care, and other measures to relive pain and suffering. May use oxygen, suction and manual treatment of airway obstruction as needed for comfort.   Comments MOST form in chart      07/12/17 1205    Code Status History    Date Active Date Inactive Code Status Order ID Comments User Context   07/11/2017 19:56 07/12/2017 12:05 Full Code [id number]  Ramonita Lab, MD Inpatient   06/21/2017 22:31 06/24/2017 01:52 Full Code [id number]  Bertrum Sol, MD Inpatient   06/01/2017 13:07 06/03/2017 20:19 Full Code [id number]  Katha Hamming, MD Inpatient   05/31/2016 21:01 06/07/2016 18:40 Full Code [id number]  Altamese Dilling, MD Inpatient      TOTAL TIME TAKING CARE OF  THIS PATIENT: 45 minutes.    Evelena Asa Giuseppe Duchemin M.D on 07/18/2017 at 11:16 AM  Between 7am to 6pm - Pager - [phone]  After 6pm go to [email or web address] - password [email or web address]  [facility]  Office  [phone]  CC: Primary care physician; Corky Downs, MD   Note: This dictation was prepared with Dragon dictation along with smaller phrase technology. Any transcriptional errors that result from this process are unintentional.

## 2017-07-18 NOTE — Care Management (Signed)
Patient for discharge home today.  She declines need for a walker.  Agreeable to home health PT only.  Notified [facility]

## 2017-07-19 ENCOUNTER — Encounter (INDEPENDENT_AMBULATORY_CARE_PROVIDER_SITE_OTHER): Payer: Medicare Other

## 2017-07-19 ENCOUNTER — Ambulatory Visit (INDEPENDENT_AMBULATORY_CARE_PROVIDER_SITE_OTHER): Payer: Medicare Other | Admitting: Vascular Surgery

## 2017-07-19 DIAGNOSIS — A0472 Enterocolitis due to Clostridium difficile, not specified as recurrent: Secondary | ICD-10-CM | POA: Diagnosis not present

## 2017-07-19 DIAGNOSIS — Z9181 History of falling: Secondary | ICD-10-CM | POA: Diagnosis not present

## 2017-07-19 DIAGNOSIS — N186 End stage renal disease: Secondary | ICD-10-CM | POA: Diagnosis not present

## 2017-07-19 DIAGNOSIS — Z950 Presence of cardiac pacemaker: Secondary | ICD-10-CM | POA: Diagnosis not present

## 2017-07-19 DIAGNOSIS — M6281 Muscle weakness (generalized): Secondary | ICD-10-CM | POA: Diagnosis not present

## 2017-07-19 DIAGNOSIS — H409 Unspecified glaucoma: Secondary | ICD-10-CM | POA: Diagnosis not present

## 2017-07-19 DIAGNOSIS — Z792 Long term (current) use of antibiotics: Secondary | ICD-10-CM | POA: Diagnosis not present

## 2017-07-19 DIAGNOSIS — E43 Unspecified severe protein-calorie malnutrition: Secondary | ICD-10-CM | POA: Diagnosis not present

## 2017-07-19 DIAGNOSIS — D631 Anemia in chronic kidney disease: Secondary | ICD-10-CM | POA: Diagnosis not present

## 2017-07-19 DIAGNOSIS — K7460 Unspecified cirrhosis of liver: Secondary | ICD-10-CM | POA: Diagnosis not present

## 2017-07-19 DIAGNOSIS — M1990 Unspecified osteoarthritis, unspecified site: Secondary | ICD-10-CM | POA: Diagnosis not present

## 2017-07-19 DIAGNOSIS — Z992 Dependence on renal dialysis: Secondary | ICD-10-CM | POA: Diagnosis not present

## 2017-07-19 DIAGNOSIS — I132 Hypertensive heart and chronic kidney disease with heart failure and with stage 5 chronic kidney disease, or end stage renal disease: Secondary | ICD-10-CM | POA: Diagnosis not present

## 2017-07-19 DIAGNOSIS — I2510 Atherosclerotic heart disease of native coronary artery without angina pectoris: Secondary | ICD-10-CM | POA: Diagnosis not present

## 2017-07-19 DIAGNOSIS — K5900 Constipation, unspecified: Secondary | ICD-10-CM | POA: Diagnosis not present

## 2017-07-19 DIAGNOSIS — I509 Heart failure, unspecified: Secondary | ICD-10-CM | POA: Diagnosis not present

## 2017-07-21 DIAGNOSIS — Z992 Dependence on renal dialysis: Secondary | ICD-10-CM | POA: Diagnosis not present

## 2017-07-21 DIAGNOSIS — N186 End stage renal disease: Secondary | ICD-10-CM | POA: Diagnosis not present

## 2017-07-24 DIAGNOSIS — Z792 Long term (current) use of antibiotics: Secondary | ICD-10-CM | POA: Diagnosis not present

## 2017-07-24 DIAGNOSIS — I509 Heart failure, unspecified: Secondary | ICD-10-CM | POA: Diagnosis not present

## 2017-07-24 DIAGNOSIS — Z950 Presence of cardiac pacemaker: Secondary | ICD-10-CM | POA: Diagnosis not present

## 2017-07-24 DIAGNOSIS — Z992 Dependence on renal dialysis: Secondary | ICD-10-CM | POA: Diagnosis not present

## 2017-07-24 DIAGNOSIS — I2510 Atherosclerotic heart disease of native coronary artery without angina pectoris: Secondary | ICD-10-CM | POA: Diagnosis not present

## 2017-07-24 DIAGNOSIS — H409 Unspecified glaucoma: Secondary | ICD-10-CM | POA: Diagnosis not present

## 2017-07-24 DIAGNOSIS — K7460 Unspecified cirrhosis of liver: Secondary | ICD-10-CM | POA: Diagnosis not present

## 2017-07-24 DIAGNOSIS — M1990 Unspecified osteoarthritis, unspecified site: Secondary | ICD-10-CM | POA: Diagnosis not present

## 2017-07-24 DIAGNOSIS — K5900 Constipation, unspecified: Secondary | ICD-10-CM | POA: Diagnosis not present

## 2017-07-24 DIAGNOSIS — Z9181 History of falling: Secondary | ICD-10-CM | POA: Diagnosis not present

## 2017-07-24 DIAGNOSIS — N186 End stage renal disease: Secondary | ICD-10-CM | POA: Diagnosis not present

## 2017-07-24 DIAGNOSIS — I132 Hypertensive heart and chronic kidney disease with heart failure and with stage 5 chronic kidney disease, or end stage renal disease: Secondary | ICD-10-CM | POA: Diagnosis not present

## 2017-07-24 DIAGNOSIS — E43 Unspecified severe protein-calorie malnutrition: Secondary | ICD-10-CM | POA: Diagnosis not present

## 2017-07-24 DIAGNOSIS — A0472 Enterocolitis due to Clostridium difficile, not specified as recurrent: Secondary | ICD-10-CM | POA: Diagnosis not present

## 2017-07-24 DIAGNOSIS — D631 Anemia in chronic kidney disease: Secondary | ICD-10-CM | POA: Diagnosis not present

## 2017-07-24 DIAGNOSIS — M6281 Muscle weakness (generalized): Secondary | ICD-10-CM | POA: Diagnosis not present

## 2017-07-24 NOTE — Discharge Instructions (Signed)
Paracentesis, Care After °Refer to this sheet in the next few weeks. These instructions provide you with information about caring for yourself after your procedure. Your health care provider may also give you more specific instructions. Your treatment has been planned according to current medical practices, but problems sometimes occur. Call your health care provider if you have any problems or questions after your procedure. °What can I expect after the procedure? °After your procedure, it is common to have a small amount of clear fluid coming from the puncture site. °Follow these instructions at home: °· Return to your normal activities as told by your health care provider. Ask your health care provider what activities are safe for you. °· Take over-the-counter and prescription medicines only as told by your health care provider. °· Do not take baths, swim, or use a hot tub until your health care provider approves. °· Follow instructions from your health care provider about: °? How to take care of your puncture site. °? When and how you should change your bandage (dressing). °? When you should remove your dressing. °· Check your puncture area every day signs of infection. Watch for: °? Redness, swelling, or pain. °? Fluid, blood, or pus. °· Keep all follow-up visits as told by your health care provider. This is important. °Contact a health care provider if: °· You have redness, swelling, or pain at your puncture site. °· You start to have more clear fluid coming from your puncture site. °· You have blood or pus coming from your puncture site. °· You have chills. °· You have a fever. °Get help right away if: °· You develop chest pain or shortness of breath. °· You develop increasing pain, discomfort, or swelling in your abdomen. °· You feel dizzy or light-headed or you pass out. °This information is not intended to replace advice given to you by your health care provider. Make sure you discuss any questions you  have with your health care provider. °Document Released: 11/04/2014 Document Revised: 11/26/2015 Document Reviewed: 09/02/2014 °Elsevier Interactive Patient Education © 2018 [employer] Inc[employer] ° °

## 2017-07-25 ENCOUNTER — Ambulatory Visit
Admission: RE | Admit: 2017-07-25 | Discharge: 2017-07-25 | Disposition: A | Discharge status: Home / Self Care | Payer: Medicare Other | Source: Ambulatory Visit | Attending: Nephrology | Admitting: Nephrology

## 2017-07-25 ENCOUNTER — Other Ambulatory Visit: Payer: Self-pay | Admitting: Nephrology

## 2017-07-25 DIAGNOSIS — N186 End stage renal disease: Secondary | ICD-10-CM | POA: Diagnosis not present

## 2017-07-25 DIAGNOSIS — E43 Unspecified severe protein-calorie malnutrition: Secondary | ICD-10-CM | POA: Diagnosis not present

## 2017-07-25 DIAGNOSIS — R188 Other ascites: Secondary | ICD-10-CM

## 2017-07-25 DIAGNOSIS — K7460 Unspecified cirrhosis of liver: Secondary | ICD-10-CM | POA: Diagnosis not present

## 2017-07-25 DIAGNOSIS — K5900 Constipation, unspecified: Secondary | ICD-10-CM | POA: Diagnosis not present

## 2017-07-25 DIAGNOSIS — H409 Unspecified glaucoma: Secondary | ICD-10-CM | POA: Diagnosis not present

## 2017-07-25 DIAGNOSIS — Z992 Dependence on renal dialysis: Secondary | ICD-10-CM | POA: Diagnosis not present

## 2017-07-25 DIAGNOSIS — D631 Anemia in chronic kidney disease: Secondary | ICD-10-CM | POA: Diagnosis not present

## 2017-07-25 DIAGNOSIS — I132 Hypertensive heart and chronic kidney disease with heart failure and with stage 5 chronic kidney disease, or end stage renal disease: Secondary | ICD-10-CM | POA: Diagnosis not present

## 2017-07-25 DIAGNOSIS — M1990 Unspecified osteoarthritis, unspecified site: Secondary | ICD-10-CM | POA: Diagnosis not present

## 2017-07-25 DIAGNOSIS — A0472 Enterocolitis due to Clostridium difficile, not specified as recurrent: Secondary | ICD-10-CM | POA: Diagnosis not present

## 2017-07-25 DIAGNOSIS — Z950 Presence of cardiac pacemaker: Secondary | ICD-10-CM | POA: Diagnosis not present

## 2017-07-25 DIAGNOSIS — M6281 Muscle weakness (generalized): Secondary | ICD-10-CM | POA: Diagnosis not present

## 2017-07-25 DIAGNOSIS — Z9181 History of falling: Secondary | ICD-10-CM | POA: Diagnosis not present

## 2017-07-25 DIAGNOSIS — Z792 Long term (current) use of antibiotics: Secondary | ICD-10-CM | POA: Diagnosis not present

## 2017-07-25 DIAGNOSIS — I2510 Atherosclerotic heart disease of native coronary artery without angina pectoris: Secondary | ICD-10-CM | POA: Diagnosis not present

## 2017-07-25 DIAGNOSIS — I509 Heart failure, unspecified: Secondary | ICD-10-CM | POA: Diagnosis not present

## 2017-07-25 NOTE — Progress Notes (Signed)
Patient presented to radiology department for possible paracentesis.  Patient states she does not feel distended today.  She requests we "take and look and make sure she doesn't have her usual amount" before proceeding with paracentesis.   Limited US Abdomen performed; results dictated separately.   Patient does have a small amount of ascites which may be amenable, however given she is not symptomatic or distended the patient opts to forego procedure today.   Melissa DysKacie Mamie Hundertmark, MS RD PA-C 11:56 AM

## 2017-07-27 DIAGNOSIS — E43 Unspecified severe protein-calorie malnutrition: Secondary | ICD-10-CM | POA: Diagnosis not present

## 2017-07-27 DIAGNOSIS — D631 Anemia in chronic kidney disease: Secondary | ICD-10-CM | POA: Diagnosis not present

## 2017-07-27 DIAGNOSIS — I132 Hypertensive heart and chronic kidney disease with heart failure and with stage 5 chronic kidney disease, or end stage renal disease: Secondary | ICD-10-CM | POA: Diagnosis not present

## 2017-07-27 DIAGNOSIS — H409 Unspecified glaucoma: Secondary | ICD-10-CM | POA: Diagnosis not present

## 2017-07-27 DIAGNOSIS — R188 Other ascites: Secondary | ICD-10-CM | POA: Diagnosis not present

## 2017-07-27 DIAGNOSIS — Z950 Presence of cardiac pacemaker: Secondary | ICD-10-CM | POA: Diagnosis not present

## 2017-07-27 DIAGNOSIS — K5900 Constipation, unspecified: Secondary | ICD-10-CM | POA: Diagnosis not present

## 2017-07-27 DIAGNOSIS — M6281 Muscle weakness (generalized): Secondary | ICD-10-CM | POA: Diagnosis not present

## 2017-07-27 DIAGNOSIS — N186 End stage renal disease: Secondary | ICD-10-CM | POA: Diagnosis not present

## 2017-07-27 DIAGNOSIS — E875 Hyperkalemia: Secondary | ICD-10-CM | POA: Diagnosis not present

## 2017-07-27 DIAGNOSIS — I2510 Atherosclerotic heart disease of native coronary artery without angina pectoris: Secondary | ICD-10-CM | POA: Diagnosis not present

## 2017-07-27 DIAGNOSIS — I509 Heart failure, unspecified: Secondary | ICD-10-CM | POA: Diagnosis not present

## 2017-07-27 DIAGNOSIS — Z992 Dependence on renal dialysis: Secondary | ICD-10-CM | POA: Diagnosis not present

## 2017-07-27 DIAGNOSIS — I10 Essential (primary) hypertension: Secondary | ICD-10-CM | POA: Diagnosis not present

## 2017-07-27 DIAGNOSIS — Z792 Long term (current) use of antibiotics: Secondary | ICD-10-CM | POA: Diagnosis not present

## 2017-07-27 DIAGNOSIS — A0472 Enterocolitis due to Clostridium difficile, not specified as recurrent: Secondary | ICD-10-CM | POA: Diagnosis not present

## 2017-07-27 DIAGNOSIS — M1990 Unspecified osteoarthritis, unspecified site: Secondary | ICD-10-CM | POA: Diagnosis not present

## 2017-07-27 DIAGNOSIS — Z9181 History of falling: Secondary | ICD-10-CM | POA: Diagnosis not present

## 2017-07-27 DIAGNOSIS — I498 Other specified cardiac arrhythmias: Secondary | ICD-10-CM | POA: Diagnosis not present

## 2017-07-27 DIAGNOSIS — K7460 Unspecified cirrhosis of liver: Secondary | ICD-10-CM | POA: Diagnosis not present

## 2017-07-28 DIAGNOSIS — N186 End stage renal disease: Secondary | ICD-10-CM | POA: Diagnosis not present

## 2017-07-28 DIAGNOSIS — Z1159 Encounter for screening for other viral diseases: Secondary | ICD-10-CM | POA: Diagnosis not present

## 2017-07-28 DIAGNOSIS — Z992 Dependence on renal dialysis: Secondary | ICD-10-CM | POA: Diagnosis not present

## 2017-07-31 DIAGNOSIS — N186 End stage renal disease: Secondary | ICD-10-CM | POA: Diagnosis not present

## 2017-07-31 DIAGNOSIS — Z992 Dependence on renal dialysis: Secondary | ICD-10-CM | POA: Diagnosis not present

## 2017-08-01 ENCOUNTER — Ambulatory Visit (INDEPENDENT_AMBULATORY_CARE_PROVIDER_SITE_OTHER): Payer: Medicare Other | Admitting: Vascular Surgery

## 2017-08-01 ENCOUNTER — Encounter (INDEPENDENT_AMBULATORY_CARE_PROVIDER_SITE_OTHER): Payer: Medicare Other

## 2017-08-01 ENCOUNTER — Ambulatory Visit: Admission: RE | Admit: 2017-08-01 | Payer: Medicare Other | Source: Ambulatory Visit

## 2017-08-01 DIAGNOSIS — I509 Heart failure, unspecified: Secondary | ICD-10-CM | POA: Diagnosis not present

## 2017-08-01 DIAGNOSIS — Z792 Long term (current) use of antibiotics: Secondary | ICD-10-CM | POA: Diagnosis not present

## 2017-08-01 DIAGNOSIS — D631 Anemia in chronic kidney disease: Secondary | ICD-10-CM | POA: Diagnosis not present

## 2017-08-01 DIAGNOSIS — Z9181 History of falling: Secondary | ICD-10-CM | POA: Diagnosis not present

## 2017-08-01 DIAGNOSIS — H409 Unspecified glaucoma: Secondary | ICD-10-CM | POA: Diagnosis not present

## 2017-08-01 DIAGNOSIS — I132 Hypertensive heart and chronic kidney disease with heart failure and with stage 5 chronic kidney disease, or end stage renal disease: Secondary | ICD-10-CM | POA: Diagnosis not present

## 2017-08-01 DIAGNOSIS — Z992 Dependence on renal dialysis: Secondary | ICD-10-CM | POA: Diagnosis not present

## 2017-08-01 DIAGNOSIS — M1990 Unspecified osteoarthritis, unspecified site: Secondary | ICD-10-CM | POA: Diagnosis not present

## 2017-08-01 DIAGNOSIS — N186 End stage renal disease: Secondary | ICD-10-CM | POA: Diagnosis not present

## 2017-08-01 DIAGNOSIS — E43 Unspecified severe protein-calorie malnutrition: Secondary | ICD-10-CM | POA: Diagnosis not present

## 2017-08-01 DIAGNOSIS — A0472 Enterocolitis due to Clostridium difficile, not specified as recurrent: Secondary | ICD-10-CM | POA: Diagnosis not present

## 2017-08-01 DIAGNOSIS — K5900 Constipation, unspecified: Secondary | ICD-10-CM | POA: Diagnosis not present

## 2017-08-01 DIAGNOSIS — I2510 Atherosclerotic heart disease of native coronary artery without angina pectoris: Secondary | ICD-10-CM | POA: Diagnosis not present

## 2017-08-01 DIAGNOSIS — M6281 Muscle weakness (generalized): Secondary | ICD-10-CM | POA: Diagnosis not present

## 2017-08-01 DIAGNOSIS — Z950 Presence of cardiac pacemaker: Secondary | ICD-10-CM | POA: Diagnosis not present

## 2017-08-01 DIAGNOSIS — K7460 Unspecified cirrhosis of liver: Secondary | ICD-10-CM | POA: Diagnosis not present

## 2017-08-02 ENCOUNTER — Ambulatory Visit
Admission: RE | Admit: 2017-08-02 | Discharge: 2017-08-02 | Disposition: A | Discharge status: Home / Self Care | Payer: Medicare Other | Source: Ambulatory Visit | Attending: Nephrology | Admitting: Nephrology

## 2017-08-02 ENCOUNTER — Other Ambulatory Visit: Payer: Self-pay | Admitting: Nephrology

## 2017-08-02 DIAGNOSIS — I2510 Atherosclerotic heart disease of native coronary artery without angina pectoris: Secondary | ICD-10-CM | POA: Diagnosis not present

## 2017-08-02 DIAGNOSIS — K5900 Constipation, unspecified: Secondary | ICD-10-CM | POA: Diagnosis not present

## 2017-08-02 DIAGNOSIS — I959 Hypotension, unspecified: Secondary | ICD-10-CM | POA: Diagnosis not present

## 2017-08-02 DIAGNOSIS — H409 Unspecified glaucoma: Secondary | ICD-10-CM | POA: Diagnosis not present

## 2017-08-02 DIAGNOSIS — A0472 Enterocolitis due to Clostridium difficile, not specified as recurrent: Secondary | ICD-10-CM | POA: Diagnosis not present

## 2017-08-02 DIAGNOSIS — N186 End stage renal disease: Secondary | ICD-10-CM | POA: Diagnosis not present

## 2017-08-02 DIAGNOSIS — K739 Chronic hepatitis, unspecified: Secondary | ICD-10-CM | POA: Diagnosis not present

## 2017-08-02 DIAGNOSIS — M1990 Unspecified osteoarthritis, unspecified site: Secondary | ICD-10-CM | POA: Diagnosis not present

## 2017-08-02 DIAGNOSIS — J9601 Acute respiratory failure with hypoxia: Secondary | ICD-10-CM | POA: Diagnosis not present

## 2017-08-02 DIAGNOSIS — I509 Heart failure, unspecified: Secondary | ICD-10-CM | POA: Diagnosis not present

## 2017-08-02 DIAGNOSIS — D631 Anemia in chronic kidney disease: Secondary | ICD-10-CM | POA: Diagnosis not present

## 2017-08-02 DIAGNOSIS — I120 Hypertensive chronic kidney disease with stage 5 chronic kidney disease or end stage renal disease: Secondary | ICD-10-CM | POA: Diagnosis not present

## 2017-08-02 DIAGNOSIS — J9600 Acute respiratory failure, unspecified whether with hypoxia or hypercapnia: Secondary | ICD-10-CM | POA: Diagnosis not present

## 2017-08-02 DIAGNOSIS — Z792 Long term (current) use of antibiotics: Secondary | ICD-10-CM | POA: Diagnosis not present

## 2017-08-02 DIAGNOSIS — Z7189 Other specified counseling: Secondary | ICD-10-CM | POA: Diagnosis not present

## 2017-08-02 DIAGNOSIS — I132 Hypertensive heart and chronic kidney disease with heart failure and with stage 5 chronic kidney disease, or end stage renal disease: Secondary | ICD-10-CM | POA: Diagnosis not present

## 2017-08-02 DIAGNOSIS — R627 Adult failure to thrive: Secondary | ICD-10-CM | POA: Diagnosis not present

## 2017-08-02 DIAGNOSIS — R7989 Other specified abnormal findings of blood chemistry: Secondary | ICD-10-CM | POA: Diagnosis not present

## 2017-08-02 DIAGNOSIS — Z515 Encounter for palliative care: Secondary | ICD-10-CM | POA: Diagnosis not present

## 2017-08-02 DIAGNOSIS — R109 Unspecified abdominal pain: Secondary | ICD-10-CM | POA: Diagnosis not present

## 2017-08-02 DIAGNOSIS — E43 Unspecified severe protein-calorie malnutrition: Secondary | ICD-10-CM | POA: Diagnosis not present

## 2017-08-02 DIAGNOSIS — Z66 Do not resuscitate: Secondary | ICD-10-CM | POA: Diagnosis not present

## 2017-08-02 DIAGNOSIS — E875 Hyperkalemia: Secondary | ICD-10-CM | POA: Diagnosis not present

## 2017-08-02 DIAGNOSIS — D696 Thrombocytopenia, unspecified: Secondary | ICD-10-CM | POA: Diagnosis not present

## 2017-08-02 DIAGNOSIS — Z950 Presence of cardiac pacemaker: Secondary | ICD-10-CM | POA: Diagnosis not present

## 2017-08-02 DIAGNOSIS — R188 Other ascites: Secondary | ICD-10-CM | POA: Insufficient documentation

## 2017-08-02 DIAGNOSIS — T829XXD Unspecified complication of cardiac and vascular prosthetic device, implant and graft, subsequent encounter: Secondary | ICD-10-CM | POA: Diagnosis not present

## 2017-08-02 DIAGNOSIS — R64 Cachexia: Secondary | ICD-10-CM | POA: Diagnosis not present

## 2017-08-02 DIAGNOSIS — T82868A Thrombosis of vascular prosthetic devices, implants and grafts, initial encounter: Secondary | ICD-10-CM | POA: Diagnosis not present

## 2017-08-02 DIAGNOSIS — E119 Type 2 diabetes mellitus without complications: Secondary | ICD-10-CM | POA: Diagnosis not present

## 2017-08-02 DIAGNOSIS — G8929 Other chronic pain: Secondary | ICD-10-CM | POA: Diagnosis not present

## 2017-08-02 DIAGNOSIS — K7460 Unspecified cirrhosis of liver: Secondary | ICD-10-CM | POA: Diagnosis not present

## 2017-08-02 DIAGNOSIS — Z992 Dependence on renal dialysis: Secondary | ICD-10-CM | POA: Diagnosis not present

## 2017-08-02 DIAGNOSIS — N2581 Secondary hyperparathyroidism of renal origin: Secondary | ICD-10-CM | POA: Diagnosis not present

## 2017-08-02 DIAGNOSIS — M6281 Muscle weakness (generalized): Secondary | ICD-10-CM | POA: Diagnosis not present

## 2017-08-02 DIAGNOSIS — R1084 Generalized abdominal pain: Secondary | ICD-10-CM | POA: Diagnosis not present

## 2017-08-02 DIAGNOSIS — E1122 Type 2 diabetes mellitus with diabetic chronic kidney disease: Secondary | ICD-10-CM | POA: Diagnosis not present

## 2017-08-02 DIAGNOSIS — Z9181 History of falling: Secondary | ICD-10-CM | POA: Diagnosis not present

## 2017-08-03 DIAGNOSIS — M6281 Muscle weakness (generalized): Secondary | ICD-10-CM | POA: Diagnosis not present

## 2017-08-03 DIAGNOSIS — Z950 Presence of cardiac pacemaker: Secondary | ICD-10-CM | POA: Diagnosis not present

## 2017-08-03 DIAGNOSIS — Z792 Long term (current) use of antibiotics: Secondary | ICD-10-CM | POA: Diagnosis not present

## 2017-08-03 DIAGNOSIS — E43 Unspecified severe protein-calorie malnutrition: Secondary | ICD-10-CM | POA: Diagnosis not present

## 2017-08-03 DIAGNOSIS — K7460 Unspecified cirrhosis of liver: Secondary | ICD-10-CM | POA: Diagnosis not present

## 2017-08-03 DIAGNOSIS — H409 Unspecified glaucoma: Secondary | ICD-10-CM | POA: Diagnosis not present

## 2017-08-03 DIAGNOSIS — M1990 Unspecified osteoarthritis, unspecified site: Secondary | ICD-10-CM | POA: Diagnosis not present

## 2017-08-03 DIAGNOSIS — Z992 Dependence on renal dialysis: Secondary | ICD-10-CM | POA: Diagnosis not present

## 2017-08-03 DIAGNOSIS — Z9181 History of falling: Secondary | ICD-10-CM | POA: Diagnosis not present

## 2017-08-03 DIAGNOSIS — I2510 Atherosclerotic heart disease of native coronary artery without angina pectoris: Secondary | ICD-10-CM | POA: Diagnosis not present

## 2017-08-03 DIAGNOSIS — A0472 Enterocolitis due to Clostridium difficile, not specified as recurrent: Secondary | ICD-10-CM | POA: Diagnosis not present

## 2017-08-03 DIAGNOSIS — K5900 Constipation, unspecified: Secondary | ICD-10-CM | POA: Diagnosis not present

## 2017-08-03 DIAGNOSIS — D631 Anemia in chronic kidney disease: Secondary | ICD-10-CM | POA: Diagnosis not present

## 2017-08-03 DIAGNOSIS — I509 Heart failure, unspecified: Secondary | ICD-10-CM | POA: Diagnosis not present

## 2017-08-03 DIAGNOSIS — N186 End stage renal disease: Secondary | ICD-10-CM | POA: Diagnosis not present

## 2017-08-03 DIAGNOSIS — I132 Hypertensive heart and chronic kidney disease with heart failure and with stage 5 chronic kidney disease, or end stage renal disease: Secondary | ICD-10-CM | POA: Diagnosis not present

## 2017-08-04 ENCOUNTER — Inpatient Hospital Stay
Admission: EM | Admit: 2017-08-04 | Discharge: 2017-08-09 | DRG: 314 | Disposition: A | Discharge status: Hospice | Payer: Medicare Other | Attending: Internal Medicine | Admitting: Internal Medicine

## 2017-08-04 ENCOUNTER — Emergency Department: Payer: Medicare Other

## 2017-08-04 ENCOUNTER — Encounter: Payer: Self-pay | Admitting: Emergency Medicine

## 2017-08-04 ENCOUNTER — Other Ambulatory Visit: Payer: Self-pay

## 2017-08-04 DIAGNOSIS — D631 Anemia in chronic kidney disease: Secondary | ICD-10-CM | POA: Diagnosis present

## 2017-08-04 DIAGNOSIS — R109 Unspecified abdominal pain: Secondary | ICD-10-CM | POA: Diagnosis not present

## 2017-08-04 DIAGNOSIS — M1990 Unspecified osteoarthritis, unspecified site: Secondary | ICD-10-CM | POA: Diagnosis present

## 2017-08-04 DIAGNOSIS — R7989 Other specified abnormal findings of blood chemistry: Secondary | ICD-10-CM | POA: Diagnosis not present

## 2017-08-04 DIAGNOSIS — E875 Hyperkalemia: Secondary | ICD-10-CM | POA: Diagnosis present

## 2017-08-04 DIAGNOSIS — I132 Hypertensive heart and chronic kidney disease with heart failure and with stage 5 chronic kidney disease, or end stage renal disease: Secondary | ICD-10-CM | POA: Diagnosis present

## 2017-08-04 DIAGNOSIS — Z9119 Patient's noncompliance with other medical treatment and regimen: Secondary | ICD-10-CM

## 2017-08-04 DIAGNOSIS — N2581 Secondary hyperparathyroidism of renal origin: Secondary | ICD-10-CM | POA: Diagnosis present

## 2017-08-04 DIAGNOSIS — I9589 Other hypotension: Secondary | ICD-10-CM | POA: Diagnosis present

## 2017-08-04 DIAGNOSIS — E1122 Type 2 diabetes mellitus with diabetic chronic kidney disease: Secondary | ICD-10-CM | POA: Diagnosis present

## 2017-08-04 DIAGNOSIS — H409 Unspecified glaucoma: Secondary | ICD-10-CM | POA: Diagnosis present

## 2017-08-04 DIAGNOSIS — Z950 Presence of cardiac pacemaker: Secondary | ICD-10-CM | POA: Diagnosis not present

## 2017-08-04 DIAGNOSIS — Z8673 Personal history of transient ischemic attack (TIA), and cerebral infarction without residual deficits: Secondary | ICD-10-CM

## 2017-08-04 DIAGNOSIS — J9601 Acute respiratory failure with hypoxia: Secondary | ICD-10-CM | POA: Diagnosis present

## 2017-08-04 DIAGNOSIS — Z66 Do not resuscitate: Secondary | ICD-10-CM | POA: Diagnosis present

## 2017-08-04 DIAGNOSIS — I959 Hypotension, unspecified: Secondary | ICD-10-CM | POA: Diagnosis not present

## 2017-08-04 DIAGNOSIS — D696 Thrombocytopenia, unspecified: Secondary | ICD-10-CM | POA: Diagnosis present

## 2017-08-04 DIAGNOSIS — N186 End stage renal disease: Secondary | ICD-10-CM | POA: Diagnosis present

## 2017-08-04 DIAGNOSIS — Q613 Polycystic kidney, unspecified: Secondary | ICD-10-CM

## 2017-08-04 DIAGNOSIS — I2510 Atherosclerotic heart disease of native coronary artery without angina pectoris: Secondary | ICD-10-CM | POA: Diagnosis present

## 2017-08-04 DIAGNOSIS — Z79899 Other long term (current) drug therapy: Secondary | ICD-10-CM

## 2017-08-04 DIAGNOSIS — E119 Type 2 diabetes mellitus without complications: Secondary | ICD-10-CM | POA: Diagnosis not present

## 2017-08-04 DIAGNOSIS — R188 Other ascites: Secondary | ICD-10-CM | POA: Diagnosis present

## 2017-08-04 DIAGNOSIS — Z515 Encounter for palliative care: Secondary | ICD-10-CM | POA: Diagnosis not present

## 2017-08-04 DIAGNOSIS — E43 Unspecified severe protein-calorie malnutrition: Secondary | ICD-10-CM | POA: Diagnosis present

## 2017-08-04 DIAGNOSIS — I509 Heart failure, unspecified: Secondary | ICD-10-CM | POA: Diagnosis present

## 2017-08-04 DIAGNOSIS — R64 Cachexia: Secondary | ICD-10-CM | POA: Diagnosis present

## 2017-08-04 DIAGNOSIS — Z681 Body mass index (BMI) 19 or less, adult: Secondary | ICD-10-CM | POA: Diagnosis not present

## 2017-08-04 DIAGNOSIS — G8929 Other chronic pain: Secondary | ICD-10-CM | POA: Diagnosis present

## 2017-08-04 DIAGNOSIS — F419 Anxiety disorder, unspecified: Secondary | ICD-10-CM | POA: Diagnosis present

## 2017-08-04 DIAGNOSIS — J9600 Acute respiratory failure, unspecified whether with hypoxia or hypercapnia: Secondary | ICD-10-CM

## 2017-08-04 DIAGNOSIS — T82868A Thrombosis of vascular prosthetic devices, implants and grafts, initial encounter: Principal | ICD-10-CM | POA: Diagnosis present

## 2017-08-04 DIAGNOSIS — Z91041 Radiographic dye allergy status: Secondary | ICD-10-CM

## 2017-08-04 DIAGNOSIS — Z992 Dependence on renal dialysis: Secondary | ICD-10-CM

## 2017-08-04 DIAGNOSIS — Z7982 Long term (current) use of aspirin: Secondary | ICD-10-CM

## 2017-08-04 DIAGNOSIS — F17210 Nicotine dependence, cigarettes, uncomplicated: Secondary | ICD-10-CM | POA: Diagnosis present

## 2017-08-04 DIAGNOSIS — Z9115 Patient's noncompliance with renal dialysis: Secondary | ICD-10-CM

## 2017-08-04 DIAGNOSIS — K739 Chronic hepatitis, unspecified: Secondary | ICD-10-CM | POA: Diagnosis not present

## 2017-08-04 DIAGNOSIS — R627 Adult failure to thrive: Secondary | ICD-10-CM | POA: Diagnosis present

## 2017-08-04 DIAGNOSIS — Z7189 Other specified counseling: Secondary | ICD-10-CM | POA: Diagnosis not present

## 2017-08-04 DIAGNOSIS — Z888 Allergy status to other drugs, medicaments and biological substances status: Secondary | ICD-10-CM

## 2017-08-04 LAB — CBC
HEMATOCRIT: 41.4 % (ref 35.0–47.0)
HEMOGLOBIN: 13.3 g/dL (ref 12.0–16.0)
MCH: 29.7 pg (ref 26.0–34.0)
MCHC: 32.2 g/dL (ref 32.0–36.0)
MCV: 92 fL (ref 80.0–100.0)
Platelets: 90 10*3/uL — ABNORMAL LOW (ref 150–440)
RBC: 4.5 MIL/uL (ref 3.80–5.20)
RDW: 17.9 % — ABNORMAL HIGH (ref 11.5–14.5)
WBC: 7 10*3/uL (ref 3.6–11.0)

## 2017-08-04 LAB — COMPREHENSIVE METABOLIC PANEL
ALBUMIN: 2.2 g/dL — AB (ref 3.5–5.0)
ALK PHOS: 98 U/L (ref 38–126)
ALT: 57 U/L — ABNORMAL HIGH (ref 14–54)
ANION GAP: 14 (ref 5–15)
AST: 37 U/L (ref 15–41)
BUN: 29 mg/dL — ABNORMAL HIGH (ref 6–20)
CALCIUM: 8.9 mg/dL (ref 8.9–10.3)
CO2: 23 mmol/L (ref 22–32)
Chloride: 102 mmol/L (ref 101–111)
Creatinine, Ser: 10.06 mg/dL — ABNORMAL HIGH (ref 0.44–1.00)
GFR calc non Af Amer: 3 mL/min — ABNORMAL LOW (ref >60)
GFR, EST AFRICAN AMERICAN: 4 mL/min — AB (ref >60)
GLUCOSE: 104 mg/dL — AB (ref 65–99)
POTASSIUM: 5.2 mmol/L — AB (ref 3.5–5.1)
Sodium: 139 mmol/L (ref 135–145)
Total Bilirubin: 0.8 mg/dL (ref 0.3–1.2)
Total Protein: 5.2 g/dL — ABNORMAL LOW (ref 6.5–8.1)

## 2017-08-04 LAB — LIPASE, BLOOD: Lipase: 23 U/L (ref 11–51)

## 2017-08-04 MED ORDER — SODIUM CHLORIDE 0.9 % IV BOLUS (SEPSIS)
500.0000 mL | Freq: Once | INTRAVENOUS | Status: AC
Start: 2017-08-04 — End: 2017-08-04
  Administered 2017-08-04: 500 mL via INTRAVENOUS

## 2017-08-04 MED ORDER — BARIUM SULFATE 2.1 % PO SUSP
450.0000 mL | ORAL | Status: AC
  Administered 2017-08-04: 450 mL via ORAL

## 2017-08-04 MED ORDER — MORPHINE SULFATE (PF) 2 MG/ML IV SOLN
2.0000 mg | Freq: Once | INTRAVENOUS | Status: AC
  Administered 2017-08-04: 2 mg via INTRAVENOUS
  Filled 2017-08-04: qty 1

## 2017-08-04 NOTE — ED Notes (Signed)
3 IV attempts by two different RNs. Will defer to a medic.

## 2017-08-04 NOTE — H&P (Addendum)
[facility] -  at [facility]   PATIENT NAME: Melissa Bradshaw    MR#:  [id number]  DATE OF BIRTH:  10/22/1950  DATE OF ADMISSION:  08/04/2017  PRIMARY CARE PHYSICIAN: Corky Downs, MD   REQUESTING/REFERRING PHYSICIAN:   CHIEF COMPLAINT:   Chief Complaint  Patient presents with  . Hypotension  . Abdominal Pain    HISTORY OF PRESENT ILLNESS: Melissa Bradshaw  is a 67 y.o. female with a known history of end-stage renal disease on hemodialysis, coronary artery disease, CHF, ascites.  Patient was recently hospitalized just in the past month for recurrent C. difficile colitis, hypotension and ascites.  She underwent paracentesis 1 week ago. Patient was brought to emergency room today for generalized, diffuse abdominal pain and hypotension.  Abdominal pain started approximately 2-3 days ago and it has been constant, severe, and both sharp and dull in nature; it does not radiate anywhere. There is intermittent nausea associated with the pain.  No fever, chills, no bleeding, no diarrhea, no chest pain, cough or shortness of breath. She did not have her dialysis treatment today due to abdominal pain and hypotension.  The most recent dialysis session, done 2 days ago was cut short due to abdominal pain.  Patient admits to not taking her medications today, as she was not feeling good. Blood test done in the emergency room revealed elevated potassium level at 5.2 ;creatinine level at 10.06; albumin is low at 2.2.  Platelet count is low at 90,000.  CAT scan of the abdomen shows large volume ascites and multiple cysts in the kidneys and liver; otherwise no acute changes. Patient is DNR.   PAST MEDICAL HISTORY:   Past Medical History:  Diagnosis Date  . Anemia   . Aneurysm (HCC)    Cerebral, [place], [place]  . Arthritis   . Cardiac pacemaker   . CHF (congestive heart failure) (HCC)   . Chronic kidney disease    ESRD  . Constipation   . Coronary artery disease    . Dialysis patient (HCC)    Tues, Thurs, Sat  . Gastric ulcer   . Glaucoma (increased eye pressure)   . Headache   . Hypertension   . Presence of permanent cardiac pacemaker     PAST SURGICAL HISTORY:  Past Surgical History:  Procedure Laterality Date  . ANEURYSM COILING    . AV FISTULA PLACEMENT Left 07/20/2016   Procedure: ARTERIOVENOUS (AV) FISTULA CREATION;  Surgeon: Renford Dills, MD;  Location: [facility] ORS;  Service: Vascular;  Laterality: Left;  . AV FISTULA PLACEMENT Left 08/26/2016   Procedure: INSERTION OF ARTERIOVENOUS (AV) GORE-TEX GRAFT ARM;  Surgeon: Renford Dills, MD;  Location: [facility] ORS;  Service: Vascular;  Laterality: Left;  . COLONOSCOPY     2013  . DIALYSIS/PERMA CATHETER INSERTION N/A 06/22/2017   Procedure: DIALYSIS/PERMA CATHETER INSERTION;  Surgeon: Annice Needy, MD;  Location: [facility] INVASIVE CV LAB;  Service: Cardiovascular;  Laterality: N/A;  . DIALYSIS/PERMA CATHETER INSERTION N/A 06/23/2017   Procedure: DIALYSIS/PERMA CATHETER INSERTION;  Surgeon: Annice Needy, MD;  Location: [facility] INVASIVE CV LAB;  Service: Cardiovascular;  Laterality: N/A;  . DIALYSIS/PERMA CATHETER REMOVAL N/A 10/11/2016   Procedure: Dialysis/Perma Catheter Removal;  Surgeon: Renford Dills, MD;  Location: [facility] INVASIVE CV LAB;  Service: Cardiovascular;  Laterality: N/A;  . INSERT / REPLACE / REMOVE PACEMAKER    . IR RADIOLOGIST EVAL & MGMT  05/18/2017  . PACEMAKER PLACEMENT    . PERIPHERAL VASCULAR  CATHETERIZATION N/A 06/02/2016   Procedure: Dialysis/Perma Catheter Insertion;  Surgeon: Annice Needy, MD;  Location: [facility] INVASIVE CV LAB;  Service: Cardiovascular;  Laterality: N/A;    SOCIAL HISTORY:  Social History   Tobacco Use  . Smoking status: Current Every Day Smoker    Packs/day: 0.25    Years: 28.00    Pack years: 7.00    Types: Cigarettes  . Smokeless tobacco: Never Used  . Tobacco comment: pt lives with people who smoke in house  Substance Use Topics  . Alcohol  use: No    FAMILY HISTORY:  Family History  Problem Relation Age of Onset  . Hypertension Mother   . Diabetes Father   . Cancer Sister   . Stroke Sister   . Hypertension Sister     DRUG ALLERGIES:  Allergies  Allergen Reactions  . Hydralazine Hcl Other (See Comments)    Makes pt jittery, nervous, messes with vision   . Iodine Itching and Rash    Unknown reaction  . Ivp Dye [Iodinated Diagnostic Agents] Rash    REVIEW OF SYSTEMS:   CONSTITUTIONAL: No fever/chills, but she complains of fatigue and generalized weakness.  EYES: No vision changes.  EARS, NOSE, AND THROAT: No tinnitus or ear pain.  RESPIRATORY: No cough, shortness of breath, wheezing or hemoptysis.  CARDIOVASCULAR: No chest pain, orthopnea, but she has bilateral lower extremity edema.  GASTROINTESTINAL: Positive for nausea and abdominal pain but no vomiting, or diarrhea.  Patient has chronic ascites. GENITOURINARY: No dysuria, hematuria.  ENDOCRINE: No polyuria, nocturia,  HEMATOLOGY: No bleeding SKIN: No rash or lesion. MUSCULOSKELETAL: History of osteoarthritis.   NEUROLOGIC: No focal weakness.  PSYCHIATRY: No anxiety or depression.   MEDICATIONS AT HOME:  Prior to Admission medications   Medication Sig Start Date End Date Taking? Authorizing Provider  aspirin 81 MG tablet Take 81 mg by mouth daily.   Yes [provider]  calcium acetate (PHOSLO) 667 MG capsule Take 1,334 mg by mouth 3 (three) times daily with meals.    Yes [provider]  dicyclomine (BENTYL) 10 MG capsule Take 1 capsule (10 mg total) by mouth 3 (three) times daily before meals. 07/18/17  Yes Salary, Evelena Asa, MD  docusate sodium (COLACE) 100 MG capsule Take 1 capsule (100 mg total) by mouth 2 (two) times daily. Patient taking differently: Take 100 mg by mouth daily as needed.  06/03/17  Yes Enid Baas, MD  latanoprost (XALATAN) 0.005 % ophthalmic solution Place 1 drop into both eyes at bedtime.   Yes [provider]  megestrol (MEGACE) 400 MG/10ML suspension Take 10 mLs (400 mg total) by mouth 2 (two) times daily. 07/18/17  Yes Salary, Evelena Asa, MD  midodrine (PROAMATINE) 10 MG tablet Take 1 tablet (10 mg total) by mouth 3 (three) times daily with meals. 07/18/17  Yes Salary, Evelena Asa, MD  multivitamin (RENA-VIT) TABS tablet Take 1 tablet by mouth at bedtime. 07/18/17  Yes Salary, Evelena Asa, MD  ondansetron (ZOFRAN ODT) 4 MG disintegrating tablet Take 1 tablet (4 mg total) by mouth every 8 (eight) hours as needed for nausea or vomiting. 06/03/17  Yes Enid Baas, MD  lidocaine-prilocaine (EMLA) cream Apply 1 application topically as directed.  11/01/16   [provider]  saccharomyces boulardii (FLORASTOR) 250 MG capsule Take 1 capsule (250 mg total) by mouth 2 (two) times daily. 07/18/17   Salary, Evelena Asa, MD  vancomycin (VANCOCIN) 250 MG capsule Take 1 capsule (250 mg total) by mouth  4 (four) times daily. Patient not taking: Reported on 08/04/2017 07/18/17   Salary, Jetty DuhamelMontell D, MD      PHYSICAL EXAMINATION:   VITAL SIGNS: Blood pressure 97/80, pulse 64, temperature 97.7 F (36.5 C), temperature source Oral, resp. rate 14, height 5\' 6"  (1.676 m), weight 47.6 kg (105 lb), SpO2 100 %.  GENERAL:  67 y.o.-year-old patient lying in the bed with no acute distress.  She looks chronically ill, but not toxic. EYES: No scleral icterus.  HEENT: Head atraumatic, normocephalic. Oropharynx and nasopharynx clear.  NECK:  Supple, no jugular venous distention. No thyroid enlargement, no tenderness.  LUNGS: Reduced breath sounds bilaterally and bibasilar crackles are noted. No use of accessory muscles of respiration.  CARDIOVASCULAR: S1, S2 normal.  No S3/S4.  ABDOMEN: Soft, very distended and diffusely tender to touch. Bowel sounds present.  EXTREMITIES: 3+ bilateral pedal edema is noted.  NEUROLOGIC: No focal weakness. Gait not checked.  PSYCHIATRIC: The patient is alert and oriented x 3.   SKIN: No obvious rash, lesion, or ulcer.   LABORATORY PANEL:   CBC Recent Labs  Lab 08/04/17 1822  WBC 7.0  HGB 13.3  HCT 41.4  PLT 90*  MCV 92.0  MCH 29.7  MCHC 32.2  RDW 17.9*   ------------------------------------------------------------------------------------------------------------------  Chemistries  Recent Labs  Lab 08/04/17 1822  NA 139  K 5.2*  CL 102  CO2 23  GLUCOSE 104*  BUN 29*  CREATININE 10.06*  CALCIUM 8.9  AST 37  ALT 57*  ALKPHOS 98  BILITOT 0.8   ------------------------------------------------------------------------------------------------------------------ estimated creatinine clearance is 4.1 mL/min (A) (by C-G formula based on SCr of 10.06 mg/dL (H)). ------------------------------------------------------------------------------------------------------------------ No results for input(s): TSH, T4TOTAL, T3FREE, THYROIDAB in the last 72 hours.  Invalid input(s): FREET3   Coagulation profile No results for input(s): INR, PROTIME in the last 168 hours. ------------------------------------------------------------------------------------------------------------------- No results for input(s): DDIMER in the last 72 hours. -------------------------------------------------------------------------------------------------------------------  Cardiac Enzymes No results for input(s): CKMB, TROPONINI, MYOGLOBIN in the last 168 hours.  Invalid input(s): CK ------------------------------------------------------------------------------------------------------------------ Invalid input(s): POCBNP  ---------------------------------------------------------------------------------------------------------------  Urinalysis    Component Value Date/Time   COLORURINE YELLOW (A) 04/24/2017 1136   APPEARANCEUR CLEAR (A) 04/24/2017 1136   LABSPEC 1.014 04/24/2017 1136   PHURINE 7.0 04/24/2017 1136   GLUCOSEU NEGATIVE 04/24/2017 1136   HGBUR NEGATIVE  04/24/2017 1136   BILIRUBINUR NEGATIVE 04/24/2017 1136   KETONESUR NEGATIVE 04/24/2017 1136   PROTEINUR 100 (A) 04/24/2017 1136   NITRITE NEGATIVE 04/24/2017 1136   LEUKOCYTESUR NEGATIVE 04/24/2017 1136     RADIOLOGY: Ct Abdomen Pelvis Wo Contrast  Result Date: 08/04/2017 CLINICAL DATA:  Abdominal pain.  Patient unable to have dialysis. EXAM: CT ABDOMEN AND PELVIS WITHOUT CONTRAST TECHNIQUE: Multidetector CT imaging of the abdomen and pelvis was performed following the standard protocol without IV contrast. COMPARISON:  06/01/2017 FINDINGS: Lower chest: Mild right basilar atelectasis. Hepatobiliary: Innumerable cysts throughout the liver with overall hepatomegaly. Again noted is increased density in the 9 cm cyst in the left hepatic lobe anteriorly. Normal gallbladder. Pancreas: Unremarkable. No pancreatic ductal dilatation or surrounding inflammatory changes. Spleen: Normal in size without focal abnormality. Adrenals/Urinary Tract: Adrenal glands are suboptimally visualized. Bilateral nephromegaly with innumerable cysts throughout bilateral kidneys. Some of the cysts demonstrate hyperdense material within them likely reflecting a small amount of hemorrhage. Bilateral renovascular calcifications. Bladder is unremarkable. Stomach/Bowel: Stomach is within normal limits. Appendix appears normal. No evidence of bowel wall thickening, distention, or inflammatory changes. Vascular/Lymphatic: Abdominal aortic atherosclerosis. Normal caliber abdominal aorta.  No lymphadenopathy. Reproductive: Uterus and bilateral adnexa are unremarkable. Other: Large volume abdominal ascites.  No abdominal wall hernia. Musculoskeletal: No acute osseous abnormality. No aggressive osseous lesion. Degenerative disc disease disc height loss and a broad-based disc bulge at L5-S1. IMPRESSION: 1. Polycystic kidney disease again noted. 2. Extensive innumerable cysts throughout the liver. 3. Large volume ascites. 4.  Aortic Atherosclerosis  (ICD10-I70.0). Electronically Signed   By: Elige Ko   On: 08/04/2017 19:48    EKG: Orders placed or performed during the hospital encounter of 08/04/17  . ED EKG  . ED EKG  . EKG 12-Lead  . EKG 12-Lead  . EKG 12-Lead  . EKG 12-Lead    IMPRESSION AND PLAN:  1.  Abdominal pain, diffuse, generalized-it could be related to large volume ascites.  Per abdominal CAT scan done in the emergency room, there are no acute changes.  Patient just finished a treatment for C. difficile colitis few days ago.  She does not have diarrhea and there is no signs of inflammation in the colon, per CAT scan.  She is afebrile and WBC is within normal limits. We will continue symptomatic treatment at this time and consult gastroenterology for further evaluation and treatment. 2.  Hypertension, likely secondary to fluid shift. We will restart Midodrine 3 times a day. 3.  Chronic ascites, status post paracentesis last week.  Further recommendations, per gastroenterology. 4.  Malnutrition, dietitian consulted. 6.  Thrombocytopenia, platelet count is 90,000, will continue to monitor closely. 7.  End-stage renal disease, on hemodialysis.  Continue treatment per nephrology.   Overall, the long-term prognosis in this patient is poor, she might benefit from palliative care consult. Patient is DNR.  All the records are reviewed and case discussed with ED provider. Management plans discussed with the patient, family and they are in agreement.  CODE STATUS: Code Status History    Date Active Date Inactive Code Status Order ID Comments User Context   07/12/2017 12:05 07/18/2017 15:45 DNR [id number]  Morton Stall, NP Inpatient   07/11/2017 19:56 07/12/2017 12:05 Full Code [id number]  Ramonita Lab, MD Inpatient   06/21/2017 22:31 06/24/2017 01:52 Full Code [id number]  Bertrum Sol, MD Inpatient   06/01/2017 13:07 06/03/2017 20:19 Full Code [id number]  Katha Hamming, MD Inpatient   05/31/2016 21:01 06/07/2016 18:40  Full Code [id number]  Altamese Dilling, MD Inpatient    Questions for Most Recent Historical Code Status (Order [id number])    Question Answer Comment   In the event of cardiac or respiratory ARREST Do not call a "code blue"    In the event of cardiac or respiratory ARREST Do not perform Intubation, CPR, defibrillation or ACLS    In the event of cardiac or respiratory ARREST Use medication by any route, position, wound care, and other measures to relive pain and suffering. May use oxygen, suction and manual treatment of airway obstruction as needed for comfort.    Comments MOST form in chart        TOTAL TIME TAKING CARE OF THIS PATIENT: 40 minutes.    Cammy Copa M.D on 08/04/2017 at 10:37 PM  Between 7am to 6pm - Pager - [phone]  After 6pm go to [email or web address] - password [email or web address] [facility]  [facility]  Office  [phone]  CC: Primary care physician; Corky Downs, MD

## 2017-08-04 NOTE — ED Notes (Signed)
Patient was repositioned on stretcher. Patient's daughter is assisting the patient with drinking the oral contrast. Patient is on the first bottle.

## 2017-08-04 NOTE — ED Provider Notes (Addendum)
[facility] Emergency Department Provider Note ____________________________________________   First MD Initiated Contact with Patient 08/04/17 1621     (approximate)  I have reviewed the triage vital signs and the nursing notes.   HISTORY  Chief Complaint Hypotension and Abdominal Pain    HPI Melissa Bradshaw is a 67 y.o. female past medical history of ESRD on dialysis and other PMH as noted below who presents with abdominal pain, occurring over the last several days, constant, and diffuse.  She has also been noted to be hypotensive, with generalized weakness.  She was seen by her primary care provider today and was sent to the emergency department for hypotension.  She also had her last dialysis 2 days ago cut short due to the abdominal pain.  She had a paracentesis performed last week.   Past Medical History:  Diagnosis Date  . Anemia   . Aneurysm (HCC)    Cerebral, [place], [place]  . Arthritis   . Cardiac pacemaker   . CHF (congestive heart failure) (HCC)   . Chronic kidney disease    ESRD  . Constipation   . Coronary artery disease   . Dialysis patient (HCC)    Tues, Thurs, Sat  . Gastric ulcer   . Glaucoma (increased eye pressure)   . Headache   . Hypertension   . Presence of permanent cardiac pacemaker     Patient Active Problem List   Diagnosis Date Noted  . ESRD (end stage renal disease) on dialysis (HCC) 06/21/2017  . Abdominal pain, epigastric 06/08/2017  . Hyperkalemia 06/01/2017  . Ascites 03/29/2017  . Polycystic liver disease 09/30/2016  . Pre-transplant evaluation for kidney transplant 09/30/2016  . Complication of vascular access for dialysis 08/15/2016  . Anemia 08/08/2016  . Protein-calorie malnutrition, severe 06/01/2016  . Fluid overload 05/31/2016  . Anemia, chronic renal failure 01/31/2016  . End stage renal disease (HCC) 01/01/2015  . Iron deficiency anemia 11/01/2014  . Essential (primary) hypertension  11/08/2012  . Artificial cardiac pacemaker 11/08/2012  . Congenital cystic disease of kidney 11/08/2012  . Temporary cerebral vascular dysfunction 11/08/2012  . Tobacco abuse, in remission 11/08/2012  . 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 Abnormal presence of protein in urine 09/21/2011  . ADPKD (autosomal dominant polycystic kidney disease) 06/07/2011    Past Surgical History:  Procedure Laterality Date  . ANEURYSM COILING    . AV FISTULA PLACEMENT Left 07/20/2016   Procedure: ARTERIOVENOUS (AV) FISTULA CREATION;  Surgeon: Renford Dills, MD;  Location: [facility] ORS;  Service: Vascular;  Laterality: Left;  . AV FISTULA PLACEMENT Left 08/26/2016   Procedure: INSERTION OF ARTERIOVENOUS (AV) GORE-TEX GRAFT ARM;  Surgeon: Renford Dills, MD;  Location: [facility] ORS;  Service: Vascular;  Laterality: Left;  . COLONOSCOPY     2013  . DIALYSIS/PERMA CATHETER INSERTION N/A 06/22/2017   Procedure: DIALYSIS/PERMA CATHETER INSERTION;  Surgeon: Annice Needy, MD;  Location: [facility] INVASIVE CV LAB;  Service: Cardiovascular;  Laterality: N/A;  . DIALYSIS/PERMA CATHETER INSERTION N/A 06/23/2017   Procedure: DIALYSIS/PERMA CATHETER INSERTION;  Surgeon: Annice Needy, MD;  Location: [facility] INVASIVE CV LAB;  Service: Cardiovascular;  Laterality: N/A;  . DIALYSIS/PERMA CATHETER REMOVAL N/A 10/11/2016   Procedure: Dialysis/Perma Catheter Removal;  Surgeon: Renford Dills, MD;  Location: [facility] INVASIVE CV LAB;  Service: Cardiovascular;  Laterality: N/A;  . INSERT / REPLACE / REMOVE PACEMAKER    . IR RADIOLOGIST EVAL & MGMT  05/18/2017  . PACEMAKER  PLACEMENT    . PERIPHERAL VASCULAR CATHETERIZATION N/A 06/02/2016   Procedure: Dialysis/Perma Catheter Insertion;  Surgeon: Annice Needy, MD;  Location: [facility] INVASIVE CV LAB;  Service: Cardiovascular;  Laterality: N/A;    Prior to Admission medications   Medication Sig Start Date End Date Taking? Authorizing Provider  aspirin 81 MG tablet Take 81 mg by  mouth daily.    [provider]  calcium acetate (PHOSLO) 667 MG capsule Take 1,334 mg by mouth 3 (three) times daily with meals.     [provider]  dicyclomine (BENTYL) 10 MG capsule Take 1 capsule (10 mg total) by mouth 3 (three) times daily before meals. 07/18/17   Salary, Evelena Asa, MD  docusate sodium (COLACE) 100 MG capsule Take 1 capsule (100 mg total) by mouth 2 (two) times daily. Patient taking differently: Take 100 mg by mouth every other day.  06/03/17   Enid Baas, MD  latanoprost (XALATAN) 0.005 % ophthalmic solution Place 1 drop into both eyes at bedtime.    [provider]  lidocaine-prilocaine (EMLA) cream Apply 1 application topically as directed.  11/01/16   [provider]  megestrol (MEGACE) 400 MG/10ML suspension Take 10 mLs (400 mg total) by mouth 2 (two) times daily. 07/18/17   Salary, Evelena Asa, MD  midodrine (PROAMATINE) 10 MG tablet Take 1 tablet (10 mg total) by mouth 3 (three) times daily with meals. 07/18/17   Salary, Jetty Duhamel D, MD  multivitamin (RENA-VIT) TABS tablet Take 1 tablet by mouth at bedtime. 07/18/17   Salary, Evelena Asa, MD  ondansetron (ZOFRAN ODT) 4 MG disintegrating tablet Take 1 tablet (4 mg total) by mouth every 8 (eight) hours as needed for nausea or vomiting. 06/03/17   Enid Baas, MD  saccharomyces boulardii (FLORASTOR) 250 MG capsule Take 1 capsule (250 mg total) by mouth 2 (two) times daily. 07/18/17   Salary, Evelena Asa, MD  vancomycin (VANCOCIN) 250 MG capsule Take 1 capsule (250 mg total) by mouth 4 (four) times daily. 07/18/17   Salary, Evelena Asa, MD    Allergies Hydralazine hcl; Iodine; and Ivp dye [iodinated diagnostic agents]  Family History  Problem Relation Age of Onset  . Hypertension Mother   . Diabetes Father   . Cancer Sister   . Stroke Sister   . Hypertension Sister     Social History Social History   Tobacco Use  . Smoking status: Current Every Day Smoker    Packs/day: 0.25     Years: 28.00    Pack years: 7.00    Types: Cigarettes  . Smokeless tobacco: Never Used  . Tobacco comment: pt lives with people who smoke in house  Substance Use Topics  . Alcohol use: No  . Drug use: No    Review of Systems  Constitutional: No fever.  Eyes: No redness. ENT: No sore throat. Cardiovascular: Denies chest pain. Respiratory: Denies shortness of breath. Gastrointestinal: Positive for nausea, no vomiting.  No diarrhea.  Genitourinary: Negative for dysuria.  Musculoskeletal: Negative for back pain. Skin: Negative for rash. Neurological: Negative for headache.   ____________________________________________   PHYSICAL EXAM:  VITAL SIGNS: ED Triage Vitals  Enc Vitals Group     BP 08/04/17 1608 (!) 70/40     Pulse Rate 08/04/17 1608 63     Resp 08/04/17 1608 16     Temp 08/04/17 1608 97.6 F (36.4 C)     Temp Source 08/04/17 1608 Axillary     SpO2 08/04/17 1744 100 %  Weight 08/04/17 1608 105 lb (47.6 kg)     Height 08/04/17 1608 5\' 6"  (1.676 m)     Head Circumference --      Peak Flow --      Pain Score 08/04/17 1608 10     Pain Loc --      Pain Edu? --      Excl. in GC? --     Constitutional: Alert and oriented.  Uncomfortable appearing well appearing and in no acute distress. Eyes: Conjunctivae are normal.  EOMI.  No scleral icterus. Head: Atraumatic. Nose: No congestion/rhinnorhea. Mouth/Throat: Mucous membranes are moist.   Neck: Normal range of motion.  Cardiovascular: Normal rate, regular rhythm. Grossly normal heart sounds.  Good peripheral circulation. Respiratory: Normal respiratory effort.  No retractions. Lungs CTAB. Gastrointestinal: Soft with mild diffuse tenderness.  Mild ascites but no distention.  Genitourinary: No flank tenderness. Musculoskeletal: 2+ bilateral lower extremity edema.  Extremities warm and well perfused.  Neurologic:  Normal speech and language.  Motor intact in all extremities.  No gross focal neurologic deficits  are appreciated.  Skin:  Skin is warm and dry. No rash noted. Psychiatric: Speech and behavior are normal.  ____________________________________________   LABS (all labs ordered are listed, but only abnormal results are displayed)  Labs Reviewed  COMPREHENSIVE METABOLIC PANEL - Abnormal; Notable for the following components:      Result Value   Potassium 5.2 (*)    Glucose, Bld 104 (*)    BUN 29 (*)    Creatinine, Ser 10.06 (*)    Total Protein 5.2 (*)    Albumin 2.2 (*)    ALT 57 (*)    GFR calc non Af Amer 3 (*)    GFR calc Af Amer 4 (*)    All other components within normal limits  CBC - Abnormal; Notable for the following components:   RDW 17.9 (*)    Platelets 90 (*)    All other components within normal limits  LIPASE, BLOOD  URINALYSIS, COMPLETE (UACMP) WITH MICROSCOPIC   ____________________________________________  EKG   ____________________________________________  RADIOLOGY  CT abdomen: Large volume ascites, no significant acute findings.  ____________________________________________   PROCEDURES  Procedure(s) performed: No  Procedures  Critical Care performed: No ____________________________________________   INITIAL IMPRESSION / ASSESSMENT AND PLAN / ED COURSE  Pertinent labs & imaging results that were available during my care of the patient were reviewed by me and considered in my medical decision making (see chart for details).  67 year old female with past medical history of ESRD on dialysis, ascites, and other past medical history as noted above presents with abdominal pain over the last several days, associated with low blood pressure for which she was sent into the ED from her primary care doctor's office today.  I reviewed the past medical records in Epic; the patient was admitted earlier this month for end-stage renal disease with shortness of breath and fluid overload, and had paracentesis at that time.  She did not have ascites fluid  culture performed at that time from the records that I am able to see in Epic.  On exam, the patient is uncomfortable but not acutely ill-appearing, her abdomen is soft with no distention and mild diffuse tenderness but no focal findings.  She has peripheral edema.  Differential for this nonspecific abdominal pain is broad but includes pain from fluid overload, diverticulitis, colitis, pancreatitis, other hepatobiliary cause, gastroenteritis.  I have low suspicion for SBP, given the patient has had a  paracentesis and her ascites appears to be minimal at this time.  Differential for the hypotension includes hypovolemia, fluid shift, or less likely infection/sepsis.  Plan: CT abdomen, basic and hepatobiliary labs, small fluid bolus, and reassess.  ----------------------------------------- 8:48 PM on 08/04/2017 -----------------------------------------  CT abdomen shows no acute findings.  Patient's blood pressure is improved.  Labs reveal elevated K, and given that patient missed her dialysis today and has improved but continued pain, she will be admitted to get dialysis and be further monitored.  EKG obtained and shows no peaked T waves or other acute findings of hyperkalemia, so no indication for emergent treatment..    There is no clinical evidence for SBP, given the patient has no fever, elevated white blood cell count, or significant abdominal tenderness. I signed the patient out to the hospitalist Dr. Ellan LambertSallary.      ____________________________________________   FINAL CLINICAL IMPRESSION(S) / ED DIAGNOSES  Final diagnoses:  End stage renal disease (HCC)  Hypotension, unspecified hypotension type  Abdominal pain, unspecified abdominal location      NEW MEDICATIONS STARTED DURING THIS VISIT:  New Prescriptions   No medications on file     Note:  This document was prepared using Dragon voice recognition software and may include unintentional dictation errors.    Dionne BucySiadecki,  Anuj Summons, MD 08/04/17 Toribio [id number]    Dionne BucySiadecki, Azavier Creson, MD 08/04/17 2123

## 2017-08-04 NOTE — ED Notes (Signed)
Introduced self to pt. Pt updated on admission process. Lights dimmed for comfort. Pt denies needs at this time.

## 2017-08-04 NOTE — ED Triage Notes (Signed)
Pt to ED via POV c/o abdominal pain for "a while". Pain was worse to day and pt was unable to have dialysis. Pt went to dialysis on Wednesday but only had 2 hours of her treatment because the pain in her abdomen was so severe. Pt went to her PCPs office today and was sent to the ED because her blood pressure was low. Pt 70/40 in triage. Rechecked Bp 85/62.

## 2017-08-05 LAB — BASIC METABOLIC PANEL
Anion gap: 12 (ref 5–15)
BUN: 30 mg/dL — ABNORMAL HIGH (ref 6–20)
CO2: 22 mmol/L (ref 22–32)
CREATININE: 10.56 mg/dL — AB (ref 0.44–1.00)
Calcium: 8.4 mg/dL — ABNORMAL LOW (ref 8.9–10.3)
Chloride: 102 mmol/L (ref 101–111)
GFR calc non Af Amer: 3 mL/min — ABNORMAL LOW (ref >60)
GFR, EST AFRICAN AMERICAN: 4 mL/min — AB (ref >60)
Glucose, Bld: 104 mg/dL — ABNORMAL HIGH (ref 65–99)
Potassium: 4.8 mmol/L (ref 3.5–5.1)
Sodium: 136 mmol/L (ref 135–145)

## 2017-08-05 LAB — GLUCOSE, CAPILLARY
GLUCOSE-CAPILLARY: 124 mg/dL — AB (ref 65–99)
Glucose-Capillary: 67 mg/dL (ref 65–99)
Glucose-Capillary: 65 mg/dL (ref 65–99)

## 2017-08-05 LAB — CBC
HCT: 36.3 % (ref 35.0–47.0)
HEMOGLOBIN: 12 g/dL (ref 12.0–16.0)
MCH: 29.9 pg (ref 26.0–34.0)
MCHC: 33.1 g/dL (ref 32.0–36.0)
MCV: 90.3 fL (ref 80.0–100.0)
PLATELETS: 120 10*3/uL — AB (ref 150–440)
RBC: 4.02 MIL/uL (ref 3.80–5.20)
RDW: 17 % — ABNORMAL HIGH (ref 11.5–14.5)
WBC: 7.8 10*3/uL (ref 3.6–11.0)

## 2017-08-05 MED ORDER — HEPARIN SODIUM (PORCINE) 5000 UNIT/ML IJ SOLN
5000.0000 [IU] | Freq: Three times a day (TID) | INTRAMUSCULAR | Status: DC
  Administered 2017-08-05 – 2017-08-09 (×11): 5000 [IU] via SUBCUTANEOUS
  Filled 2017-08-05 (×12): qty 1

## 2017-08-05 MED ORDER — ACETAMINOPHEN 325 MG PO TABS: 650.0000 mg | ORAL_TABLET | Freq: Four times a day (QID) | ORAL | Status: DC | PRN

## 2017-08-05 MED ORDER — DICYCLOMINE HCL 10 MG PO CAPS
10.0000 mg | ORAL_CAPSULE | Freq: Three times a day (TID) | ORAL | Status: DC
  Administered 2017-08-05 – 2017-08-09 (×13): 10 mg via ORAL
  Filled 2017-08-05 (×13): qty 1

## 2017-08-05 MED ORDER — DOCUSATE SODIUM 100 MG PO CAPS
100.0000 mg | ORAL_CAPSULE | Freq: Two times a day (BID) | ORAL | Status: DC
  Administered 2017-08-05 – 2017-08-09 (×7): 100 mg via ORAL
  Filled 2017-08-05 (×9): qty 1

## 2017-08-05 MED ORDER — MIDODRINE HCL 5 MG PO TABS
10.0000 mg | ORAL_TABLET | Freq: Three times a day (TID) | ORAL | Status: DC
  Administered 2017-08-05 – 2017-08-09 (×12): 10 mg via ORAL
  Filled 2017-08-05 (×17): qty 2

## 2017-08-05 MED ORDER — ONDANSETRON HCL 4 MG/2ML IJ SOLN
4.0000 mg | Freq: Four times a day (QID) | INTRAMUSCULAR | Status: DC | PRN
  Administered 2017-08-07 (×2): 4 mg via INTRAVENOUS
  Filled 2017-08-05 (×2): qty 2

## 2017-08-05 MED ORDER — BISACODYL 5 MG PO TBEC: 5.0000 mg | DELAYED_RELEASE_TABLET | Freq: Every day | ORAL | Status: DC | PRN

## 2017-08-05 MED ORDER — ASPIRIN EC 81 MG PO TBEC
81.0000 mg | DELAYED_RELEASE_TABLET | Freq: Every day | ORAL | Status: DC
  Administered 2017-08-05 – 2017-08-09 (×5): 81 mg via ORAL
  Filled 2017-08-05 (×5): qty 1

## 2017-08-05 MED ORDER — DEXTROSE 50 % IV SOLN
25.0000 mL | Freq: Once | INTRAVENOUS | Status: AC
  Administered 2017-08-05: 25 mL via INTRAVENOUS

## 2017-08-05 MED ORDER — HYDROCODONE-ACETAMINOPHEN 5-325 MG PO TABS
1.0000 | ORAL_TABLET | ORAL | Status: DC | PRN
  Administered 2017-08-05 – 2017-08-06 (×3): 1 via ORAL
  Administered 2017-08-06 – 2017-08-07 (×2): 2 via ORAL
  Administered 2017-08-07: 1 via ORAL
  Filled 2017-08-05: qty 1
  Filled 2017-08-05: qty 2
  Filled 2017-08-05: qty 1
  Filled 2017-08-05: qty 2
  Filled 2017-08-05 (×3): qty 1

## 2017-08-05 MED ORDER — ONDANSETRON HCL 4 MG PO TABS: 4.0000 mg | ORAL_TABLET | Freq: Four times a day (QID) | ORAL | Status: DC | PRN

## 2017-08-05 MED ORDER — DEXTROSE 50 % IV SOLN
INTRAVENOUS | Status: AC
  Administered 2017-08-05: 25 mL via INTRAVENOUS
  Filled 2017-08-05: qty 50

## 2017-08-05 MED ORDER — ACETAMINOPHEN 650 MG RE SUPP: 650.0000 mg | Freq: Four times a day (QID) | RECTAL | Status: DC | PRN

## 2017-08-05 MED ORDER — TRAZODONE HCL 50 MG PO TABS: 25.0000 mg | ORAL_TABLET | Freq: Every evening | ORAL | Status: DC | PRN

## 2017-08-05 MED ORDER — CIPROFLOXACIN IN D5W 400 MG/200ML IV SOLN
400.0000 mg | INTRAVENOUS | Status: DC
  Administered 2017-08-05 – 2017-08-06 (×2): 400 mg via INTRAVENOUS
  Filled 2017-08-05 (×4): qty 200

## 2017-08-05 MED ORDER — DOCUSATE SODIUM 100 MG PO CAPS
100.0000 mg | ORAL_CAPSULE | Freq: Two times a day (BID) | ORAL | Status: DC
  Filled 2017-08-05: qty 1

## 2017-08-05 MED ORDER — LATANOPROST 0.005 % OP SOLN
1.0000 [drp] | Freq: Every day | OPHTHALMIC | Status: DC
  Administered 2017-08-05 – 2017-08-07 (×3): 1 [drp] via OPHTHALMIC
  Filled 2017-08-05: qty 2.5

## 2017-08-05 MED ORDER — RENA-VITE PO TABS
1.0000 | ORAL_TABLET | Freq: Every day | ORAL | Status: DC
  Administered 2017-08-05 – 2017-08-08 (×5): 1 via ORAL
  Filled 2017-08-05 (×5): qty 1

## 2017-08-05 NOTE — Progress Notes (Signed)
[facility] [place] at [facility]   PATIENT NAME: Melissa Bradshaw    MR#:  [id number]  DATE OF BIRTH:  01/14/1951  SUBJECTIVE: Seen at bedside, admitted yesterday night for abdominal pain, low blood pressure.  Today she still has some mild abdominal pain and refusing hemodialysis.  No nausea.  No fever.  No shortness of breath.  No chest pain.  No chills.  CHIEF COMPLAINT:   Chief Complaint  Patient presents with  . Hypotension  . Abdominal Pain    REVIEW OF SYSTEMS:   ROS CONSTITUTIONAL: No fever, fatigue or weakness.  EYES: No blurred or double vision.  EARS, NOSE, AND THROAT: No tinnitus or ear pain.  RESPIRATORY: No cough, shortness of breath, wheezing or hemoptysis.  CARDIOVASCULAR: No chest pain, orthopnea, edema.  GASTROINTESTINAL: No nausea, vomiting, diarrhea. chronic abdominal pain but got worse recently.  Denies any fever.  GENITOURINARY: No dysuria, hematuria.  ENDOCRINE: No polyuria, nocturia,  HEMATOLOGY: No anemia, easy bruising or bleeding SKIN: No rash or lesion. MUSCULOSKELETAL: No joint pain or arthritis.   NEUROLOGIC: No tingling, numbness, weakness.  PSYCHIATRY: No anxiety or depression.   DRUG ALLERGIES:   Allergies  Allergen Reactions  . Hydralazine Hcl Other (See Comments)    Makes pt jittery, nervous, messes with vision   . Iodine Itching and Rash    Unknown reaction  . Ivp Dye [Iodinated Diagnostic Agents] Rash    VITALS:  Blood pressure 100/65, pulse 77, temperature 97.6 F (36.4 C), temperature source Oral, resp. rate 17, height 5\' 6"  (1.676 m), weight 48.9 kg (107 lb 12.9 oz), SpO2 97 %.  PHYSICAL EXAMINATION:  GENERAL:  67 y.o.-year-old patient lying in the bed with no acute distress.  Appears chronically ill, cachectic. EYES: Pupils equal, round, reactive to light . No scleral icterus. Extraocular muscles intact.  HEENT: Head atraumatic, normocephalic. Oropharynx and nasopharynx clear.  NECK:  Supple, no jugular  venous distention. No thyroid enlargement, no tenderness.  LUNGS: Normal breath sounds bilaterally, no wheezing, rales,rhonchi or crepitation. No use of accessory muscles of respiration.  CARDIOVASCULAR: S1, S2 normal. No murmurs, rubs, or gallops.  ABDOMEN: Soft, nontender, nondistended. Bowel sounds present. No organomegaly or mass.  Nontender on exam.  Does have umbilical hernia. EXTREMITIES: Trace pedal edema, no cyanosis, or clubbing.  NEUROLOGIC: Cranial nerves II through XII are intact. Muscle strength 5/5 in all extremities. Sensation intact. Gait not checked.  PSYCHIATRIC: The patient is alert and oriented x 3.  SKIN: No obvious rash, lesion, or ulcer.    LABORATORY PANEL:   CBC Recent Labs  Lab 08/04/17 1822  WBC 7.0  HGB 13.3  HCT 41.4  PLT 90*   ------------------------------------------------------------------------------------------------------------------  Chemistries  Recent Labs  Lab 08/04/17 1822  NA 139  K 5.2*  CL 102  CO2 23  GLUCOSE 104*  BUN 29*  CREATININE 10.06*  CALCIUM 8.9  AST 37  ALT 57*  ALKPHOS 98  BILITOT 0.8   ------------------------------------------------------------------------------------------------------------------  Cardiac Enzymes No results for input(s): TROPONINI in the last 168 hours. ------------------------------------------------------------------------------------------------------------------  RADIOLOGY:  Ct Abdomen Pelvis Wo Contrast  Result Date: 08/04/2017 CLINICAL DATA:  Abdominal pain.  Patient unable to have dialysis. EXAM: CT ABDOMEN AND PELVIS WITHOUT CONTRAST TECHNIQUE: Multidetector CT imaging of the abdomen and pelvis was performed following the standard protocol without IV contrast. COMPARISON:  06/01/2017 FINDINGS: Lower chest: Mild right basilar atelectasis. Hepatobiliary: Innumerable cysts throughout the liver with overall hepatomegaly. Again noted is increased density in the 9  cm cyst in the left hepatic  lobe anteriorly. Normal gallbladder. Pancreas: Unremarkable. No pancreatic ductal dilatation or surrounding inflammatory changes. Spleen: Normal in size without focal abnormality. Adrenals/Urinary Tract: Adrenal glands are suboptimally visualized. Bilateral nephromegaly with innumerable cysts throughout bilateral kidneys. Some of the cysts demonstrate hyperdense material within them likely reflecting a small amount of hemorrhage. Bilateral renovascular calcifications. Bladder is unremarkable. Stomach/Bowel: Stomach is within normal limits. Appendix appears normal. No evidence of bowel wall thickening, distention, or inflammatory changes. Vascular/Lymphatic: Abdominal aortic atherosclerosis. Normal caliber abdominal aorta. No lymphadenopathy. Reproductive: Uterus and bilateral adnexa are unremarkable. Other: Large volume abdominal ascites.  No abdominal wall hernia. Musculoskeletal: No acute osseous abnormality. No aggressive osseous lesion. Degenerative disc disease disc height loss and a broad-based disc bulge at L5-S1. IMPRESSION: 1. Polycystic kidney disease again noted. 2. Extensive innumerable cysts throughout the liver. 3. Large volume ascites. 4.  Aortic Atherosclerosis (ICD10-I70.0). Electronically Signed   By: Elige KoHetal  Patel   On: 08/04/2017 19:48    EKG:   Orders placed or performed during the hospital encounter of 08/04/17  . ED EKG  . ED EKG  . EKG 12-Lead  . EKG 12-Lead  . EKG 12-Lead  . EKG 12-Lead    ASSESSMENT AND PLAN:   #1 diffuse abdominal pain due to large volume ascites, CT abdomen did not show acute changes.  Recently finished treatment for C. difficile colitis.  Patient is on pain medicine, afebrile, WBC normal.  Start empiric antibiotics for abdominal pain, ascites with possible SBP.  GI consult appreciated.  Patient is n.p.o. but she wants to eat especially if the labs are normal and CT is normal started on patient soft diet . 2.  Hypertension: Patient has chronic  hypotension, patient is on Midodrin at home restarted that. 3.  ESRD: On hemodialysis Monday, Wednesday, Friday.  Last hemodialysis was on Monday, patient missed dialysis on Wednesday, Friday and she is refusing hemodialysis today, Dr. Miguel AschoffSarah Kolluru saw the patient.  Explained the patient that she needs to be on hemodialysis, at this time she is not hypoxic watch closely and recommended dialysis, again.  No uremic symptoms.  Patient known to be noncompliant with dialysis previously also. 4.  Thrombocytopenia: Chronic. Chronic severe malnutrition: Seen by registered dietitian during admission in January, patient needs to be on Magic cup 3 times daily with meals, vitamin C, renal white, multivitamins.     All the records are reviewed and case discussed with Care Management/Social Workerr. Management plans discussed with the patient, family and they are in agreement.  CODE STATUS: DNR  TOTAL TIME TAKING CARE OF THIS PATIENT: 35 minutes.   POSSIBLE D/C IN 1-2DAYS, DEPENDING ON CLINICAL CONDITION.   Katha HammingSnehalatha Anahita Cua M.D on 08/05/2017 at 10:47 AM  Between 7am to 6pm - Pager - [phone]  After 6pm go to [email or web address] - password [email or web address] [facility]  [facility]  Office  [phone]  CC: Primary care physician; Corky DownsMasoud, Javed, MD   Note: This dictation was prepared with Dragon dictation along with smaller phrase technology. Any transcriptional errors that result from this process are unintentional.

## 2017-08-05 NOTE — Progress Notes (Signed)
HD called for pain med for pt. Pt had pain 10/10. Before arriving to HD, they said there were sending her back now and not to come.  I held pain med until pt arrived back to floor.

## 2017-08-05 NOTE — Progress Notes (Signed)
Back from HD- unable to do dialysis. HD nurse states that perm cath is not working and that Dr Wynelle LinkKolluru is aware.  Pt is in pain, 10/10. Med given.  Daughter at bedside and informed of situation with pt consent

## 2017-08-05 NOTE — Progress Notes (Signed)
Dr Luberta MutterKonidena rounded on pt. She agreed to soft diet and convinced pt to have dialysis this afternoon. Dr Luberta MutterKonidena and Dr Wynelle LinkKolluru aware that we are unable to get labs this morning

## 2017-08-05 NOTE — Plan of Care (Signed)
Advanced diet today, tolerating well.  Very fatigued and weak, but feel better in the afternoon for HD.

## 2017-08-05 NOTE — Progress Notes (Signed)
[facility]  ROUNDING NOTE   Subjective:   Ms. Melissa Bradshaw admitted to [facility] on 08/04/2017 for End stage renal disease (HCC) [N18.6] Abdominal pain, unspecified abdominal location [R10.9] Hypotension, unspecified hypotension type [I95.9]   Patient complains of abdominal pain. Last paracentesis was 08/02/17. 3.6 liters removed.   Last hemodialysis on Monday.   Objective:  Vital signs in last 24 hours:  Temp:  [97.6 F (36.4 C)-98.4 F (36.9 C)] 97.6 F (36.4 C) (02/02 0840) Pulse Rate:  [46-82] 77 (02/02 0840) Resp:  [12-19] 17 (02/02 0840) BP: (70-112)/(40-92) 100/65 (02/02 0840) SpO2:  [95 %-100 %] 97 % (02/02 0840) Weight:  [47.6 kg (105 lb)-48.9 kg (107 lb 12.9 oz)] 48.9 kg (107 lb 12.9 oz) (02/02 0421)  Weight change:  Filed Weights   08/04/17 1608 08/05/17 0421  Weight: 47.6 kg (105 lb) 48.9 kg (107 lb 12.9 oz)    Intake/Output: I/O last 3 completed shifts: In: 510 [P.O.:10; IV Piggyback:500] Out: -    Intake/Output this shift:  No intake/output data recorded.  Physical Exam: General: NAD,   Head: Normocephalic, atraumatic. Moist oral mucosal membranes  Eyes: Anicteric, PERRL  Neck: Supple, trachea midline  Lungs:  Clear to auscultation  Heart: Regular rate and rhythm  Abdomen:  Soft, nontender,   Extremities: no peripheral edema.  Neurologic: Nonfocal, moving all four extremities  Skin: No lesions  Access: Left AVF    Basic Metabolic Panel: Recent Labs  Lab 08/04/17 1822  NA 139  K 5.2*  CL 102  CO2 23  GLUCOSE 104*  BUN 29*  CREATININE 10.06*  CALCIUM 8.9    Liver Function Tests: Recent Labs  Lab 08/04/17 1822  AST 37  ALT 57*  ALKPHOS 98  BILITOT 0.8  PROT 5.2*  ALBUMIN 2.2*   Recent Labs  Lab 08/04/17 1822  LIPASE 23   No results for input(s): AMMONIA in the last 168 hours.  CBC: Recent Labs  Lab 08/04/17 1822  WBC 7.0  HGB 13.3  HCT 41.4  MCV 92.0  PLT 90*    Cardiac Enzymes: No results for  input(s): CKTOTAL, CKMB, CKMBINDEX, TROPONINI in the last 168 hours.  BNP: Invalid input(s): POCBNP  CBG: Recent Labs  Lab 08/05/17 0756 08/05/17 0837 08/05/17 0919  GLUCAP 67 65 124*    Microbiology: Results for orders placed or performed during the hospital encounter of 07/11/17  MRSA PCR Screening     Status: None   Collection Time: 07/11/17  8:25 PM  Result Value Ref Range Status   MRSA by PCR NEGATIVE NEGATIVE Final    Comment:        The GeneXpert MRSA Assay (FDA approved for NASAL specimens only), is one component of a comprehensive MRSA colonization surveillance program. It is not intended to diagnose MRSA infection nor to guide or monitor treatment for MRSA infections. Performed at [facility], [address] [place], [place] [address]   C difficile quick scan w PCR reflex     Status: Abnormal   Collection Time: 07/13/17 12:20 AM  Result Value Ref Range Status   C Diff antigen POSITIVE (A) NEGATIVE Final   C Diff toxin NEGATIVE NEGATIVE Final   C Diff interpretation Results are indeterminate. See PCR results.  Final    Comment: Performed at [facility], [address] [place], [place] [address]  Gastrointestinal Panel by PCR , Stool     Status: None   Collection Time: 07/13/17 12:20 AM  Result Value  Ref Range Status   Campylobacter species NOT DETECTED NOT DETECTED Final   Plesimonas shigelloides NOT DETECTED NOT DETECTED Final   Salmonella species NOT DETECTED NOT DETECTED Final   Yersinia enterocolitica NOT DETECTED NOT DETECTED Final   Vibrio species NOT DETECTED NOT DETECTED Final   Vibrio cholerae NOT DETECTED NOT DETECTED Final   Enteroaggregative E coli (EAEC) NOT DETECTED NOT DETECTED Final   Enteropathogenic E coli (EPEC) NOT DETECTED NOT DETECTED Final   Enterotoxigenic E coli (ETEC) NOT DETECTED NOT DETECTED Final   Shiga like toxin producing E coli (STEC) NOT DETECTED NOT DETECTED Final   Shigella/Enteroinvasive E coli  (EIEC) NOT DETECTED NOT DETECTED Final   Cryptosporidium NOT DETECTED NOT DETECTED Final   Cyclospora cayetanensis NOT DETECTED NOT DETECTED Final   Entamoeba histolytica NOT DETECTED NOT DETECTED Final   Giardia lamblia NOT DETECTED NOT DETECTED Final   Adenovirus F40/41 NOT DETECTED NOT DETECTED Final   Astrovirus NOT DETECTED NOT DETECTED Final   Norovirus GI/GII NOT DETECTED NOT DETECTED Final   Rotavirus A NOT DETECTED NOT DETECTED Final   Sapovirus (I, II, IV, and V) NOT DETECTED NOT DETECTED Final    Comment: Performed at [facility], [address] [place], [place] [address]  C. Diff by PCR, Reflexed     Status: Abnormal   Collection Time: 07/13/17 12:20 AM  Result Value Ref Range Status   Toxigenic C. Difficile by PCR POSITIVE (A) NEGATIVE Final    Comment: Positive for toxigenic C. difficile with little to no toxin production. Only treat if clinical presentation suggests symptomatic illness. Performed at [facility], [address] [place], [place] [address]     Coagulation Studies: No results for input(s): LABPROT, INR in the last 72 hours.  Urinalysis: No results for input(s): COLORURINE, LABSPEC, PHURINE, GLUCOSEU, HGBUR, BILIRUBINUR, KETONESUR, PROTEINUR, UROBILINOGEN, NITRITE, LEUKOCYTESUR in the last 72 hours.  Invalid input(s): APPERANCEUR    Imaging: Ct Abdomen Pelvis Wo Contrast  Result Date: 08/04/2017 CLINICAL DATA:  Abdominal pain.  Patient unable to have dialysis. EXAM: CT ABDOMEN AND PELVIS WITHOUT CONTRAST TECHNIQUE: Multidetector CT imaging of the abdomen and pelvis was performed following the standard protocol without IV contrast. COMPARISON:  06/01/2017 FINDINGS: Lower chest: Mild right basilar atelectasis. Hepatobiliary: Innumerable cysts throughout the liver with overall hepatomegaly. Again noted is increased density in the 9 cm cyst in the left hepatic lobe anteriorly. Normal gallbladder. Pancreas: Unremarkable. No pancreatic  ductal dilatation or surrounding inflammatory changes. Spleen: Normal in size without focal abnormality. Adrenals/Urinary Tract: Adrenal glands are suboptimally visualized. Bilateral nephromegaly with innumerable cysts throughout bilateral kidneys. Some of the cysts demonstrate hyperdense material within them likely reflecting a small amount of hemorrhage. Bilateral renovascular calcifications. Bladder is unremarkable. Stomach/Bowel: Stomach is within normal limits. Appendix appears normal. No evidence of bowel wall thickening, distention, or inflammatory changes. Vascular/Lymphatic: Abdominal aortic atherosclerosis. Normal caliber abdominal aorta. No lymphadenopathy. Reproductive: Uterus and bilateral adnexa are unremarkable. Other: Large volume abdominal ascites.  No abdominal wall hernia. Musculoskeletal: No acute osseous abnormality. No aggressive osseous lesion. Degenerative disc disease disc height loss and a broad-based disc bulge at L5-S1. IMPRESSION: 1. Polycystic kidney disease again noted. 2. Extensive innumerable cysts throughout the liver. 3. Large volume ascites. 4.  Aortic Atherosclerosis (ICD10-I70.0). Electronically Signed   By: Elige Ko   On: 08/04/2017 19:48     Medications:   . ciprofloxacin     . aspirin EC  81 mg Oral Daily  . dicyclomine  10  mg Oral TID AC  . docusate sodium  100 mg Oral BID  . docusate sodium  100 mg Oral BID  . heparin  5,000 Units Subcutaneous Q8H  . latanoprost  1 drop Both Eyes QHS  . midodrine  10 mg Oral TID WC  . multivitamin  1 tablet Oral QHS   acetaminophen **OR** acetaminophen, bisacodyl, HYDROcodone-acetaminophen, ondansetron **OR** ondansetron (ZOFRAN) IV, traZODone  Assessment/ Plan:  Melissa Bradshaw is a 67 y.o. black female with end stage renal disease on hemodialysis, anemia, hypertension, polycystic kidney disease, history of pacemaker, history of TIA, vertebral artery aneurysm status post coiling  MWF [facility] [address]  Left AVG EDW 54.5kg  1. End-stage renal disease: with hyperkalemia. Last hemodialysis was Monday. Patient refusing hemodialysis.  No acute indication for dialysis at this time.  Resume MWF schedule.  2. Hypertension: hypotensive: Not currently taking any blood pressure agents.   3. Anemia chronic kidney disease:  Hemoglobin 13.3 - holding EPO  4. Secondary hyperparathyroidism: with hyperphosphatemia and hypocalcemia. Holding cinacalcet and calcium acetate   LOS: 1 Torence Palmeri 2/2/201912:10 PM

## 2017-08-05 NOTE — Progress Notes (Signed)
Pharmacy Antibiotic Note  Melissa SchroederCaroline J Bradshaw is a 67 y.o. female admitted on 08/04/2017 with intra abdominal infection.  Pharmacy has been consulted for ciprofloxacin dosing.  Plan: ciprofloxacin 400mg  iv q24h   Height: 5\' 6"  (167.6 cm) Weight: 107 lb 12.9 oz (48.9 kg) IBW/kg (Calculated) : 59.3  Temp (24hrs), Avg:97.8 F (36.6 C), Min:97.6 F (36.4 C), Max:98.4 F (36.9 C)  Recent Labs  Lab 08/04/17 1822  WBC 7.0  CREATININE 10.06*    Estimated Creatinine Clearance: 4.2 mL/min (A) (by C-G formula based on SCr of 10.06 mg/dL (H)).    Allergies  Allergen Reactions  . Hydralazine Hcl Other (See Comments)    Makes pt jittery, nervous, messes with vision   . Iodine Itching and Rash    Unknown reaction  . Ivp Dye [Iodinated Diagnostic Agents] Rash    Antimicrobials this admission: Anti-infectives (From admission, onward)   Start     Dose/Rate Route Frequency Ordered Stop   08/05/17 1130  ciprofloxacin (CIPRO) IVPB 400 mg     400 mg 200 mL/hr over 60 Minutes Intravenous Every 24 hours 08/05/17 1118        Microbiology results: No results found for this or any previous visit (from the past 240 hour(s)).   Thank you for allowing pharmacy to be a part of this patient's care.  Melissa PebblesGarrett Calbert Bradshaw 08/05/2017 11:18 AM

## 2017-08-06 DIAGNOSIS — E119 Type 2 diabetes mellitus without complications: Secondary | ICD-10-CM

## 2017-08-06 DIAGNOSIS — N186 End stage renal disease: Secondary | ICD-10-CM

## 2017-08-06 DIAGNOSIS — R109 Unspecified abdominal pain: Secondary | ICD-10-CM

## 2017-08-06 DIAGNOSIS — I959 Hypotension, unspecified: Secondary | ICD-10-CM

## 2017-08-06 LAB — GLUCOSE, CAPILLARY: GLUCOSE-CAPILLARY: 90 mg/dL (ref 65–99)

## 2017-08-06 MED ORDER — ADULT MULTIVITAMIN W/MINERALS CH
1.0000 | ORAL_TABLET | ORAL | Status: DC
  Administered 2017-08-07: 1 via ORAL
  Filled 2017-08-06: qty 1

## 2017-08-06 MED ORDER — BOOST / RESOURCE BREEZE PO LIQD CUSTOM
1.0000 | Freq: Three times a day (TID) | ORAL | Status: DC
  Administered 2017-08-08 – 2017-08-09 (×4): 1 via ORAL

## 2017-08-06 MED ORDER — VITAMIN C 500 MG PO TABS
500.0000 mg | ORAL_TABLET | Freq: Two times a day (BID) | ORAL | Status: DC
  Administered 2017-08-07 – 2017-08-09 (×3): 500 mg via ORAL
  Filled 2017-08-06 (×5): qty 1

## 2017-08-06 NOTE — Progress Notes (Signed)
[facility] [place] [facility]   PATIENT NAME: Melissa PortelaCaroline Bradshaw    MR#:  [id number]  DATE OF BIRTH:  06/30/1951  SUBJECTIVE: Less abdominal pain but has worsening ascites.  Dialysis catheter is not working.  CHIEF COMPLAINT:   Chief Complaint  Patient presents with  . Hypotension  . Abdominal Pain    REVIEW OF SYSTEMS:   ROS CONSTITUTIONAL: No fever,  But has fatigue or weakness.  EYES: No blurred or double vision.  EARS, NOSE, AND THROAT: No tinnitus or ear pain.  RESPIRATORY: No cough, shortness of breath, wheezing or hemoptysis.  CARDIOVASCULAR: No chest pain, orthopnea, edema.  GASTROINTESTINAL: No nausea, vomiting, diarrhea. chronic abdominal pain but got worse recently.  Denies any fever.  GENITOURINARY: No dysuria, hematuria.  ENDOCRINE: No polyuria, nocturia,  HEMATOLOGY: No anemia, easy bruising or bleeding SKIN: No rash or lesion. MUSCULOSKELETAL: No joint pain or arthritis.   NEUROLOGIC: No tingling, numbness, weakness.  PSYCHIATRY: No anxiety or depression.   DRUG ALLERGIES:   Allergies  Allergen Reactions  . Hydralazine Hcl Other (See Comments)    Makes pt jittery, nervous, messes with vision   . Iodine Itching and Rash    Unknown reaction  . Ivp Dye [Iodinated Diagnostic Agents] Rash    VITALS:  Blood pressure (!) 92/59, pulse 73, temperature (!) 97.5 F (36.4 C), temperature source Oral, resp. rate 18, height 5\' 6"  (1.676 m), weight 52.2 kg (115 lb), SpO2 98 %.  PHYSICAL EXAMINATION:  GENERAL:  67 y.o.-year-old patient lying in the bed with no acute distress.  Appears chronically ill, cachectic. EYES: Pupils equal, round, reactive to light . No scleral icterus. Extraocular muscles intact.  HEENT: Head atraumatic, normocephalic. Oropharynx and nasopharynx clear.  NECK:  Supple, no jugular venous distention. No thyroid enlargement, no tenderness.  LUNGS: Normal breath sounds bilaterally, no wheezing, rales,rhonchi or  crepitation. No use of accessory muscles of respiration.  CARDIOVASCULAR: S1, S2 normal. No murmurs, rubs, or gallops.  ABDOMEN: Soft, nontender, distended with ascites. Bowel sounds present. No organomegaly or mass.  Nontender on exam.  Does have umbilical hernia. EXTREMITIES: Trace pedal edema, no cyanosis, or clubbing.  NEUROLOGIC: Cranial nerves II through XII are intact. Muscle strength 5/5 in all extremities. Sensation intact. Gait not checked.  PSYCHIATRIC: The patient is alert and oriented x 3.  SKIN: No obvious rash, lesion, or ulcer.    LABORATORY PANEL:   CBC Recent Labs  Lab 08/05/17 1704  WBC 7.8  HGB 12.0  HCT 36.3  PLT 120*   ------------------------------------------------------------------------------------------------------------------  Chemistries  Recent Labs  Lab 08/04/17 1822 08/05/17 1704  NA 139 136  K 5.2* 4.8  CL 102 102  CO2 23 22  GLUCOSE 104* 104*  BUN 29* 30*  CREATININE 10.06* 10.56*  CALCIUM 8.9 8.4*  AST 37  --   ALT 57*  --   ALKPHOS 98  --   BILITOT 0.8  --    ------------------------------------------------------------------------------------------------------------------  Cardiac Enzymes No results for input(s): TROPONINI in the last 168 hours. ------------------------------------------------------------------------------------------------------------------  RADIOLOGY:  Ct Abdomen Pelvis Wo Contrast  Result Date: 08/04/2017 CLINICAL DATA:  Abdominal pain.  Patient unable to have dialysis. EXAM: CT ABDOMEN AND PELVIS WITHOUT CONTRAST TECHNIQUE: Multidetector CT imaging of the abdomen and pelvis was performed following the standard protocol without IV contrast. COMPARISON:  06/01/2017 FINDINGS: Lower chest: Mild right basilar atelectasis. Hepatobiliary: Innumerable cysts throughout the liver with overall hepatomegaly. Again noted is increased density in the 9 cm cyst  in the left hepatic lobe anteriorly. Normal gallbladder. Pancreas:  Unremarkable. No pancreatic ductal dilatation or surrounding inflammatory changes. Spleen: Normal in size without focal abnormality. Adrenals/Urinary Tract: Adrenal glands are suboptimally visualized. Bilateral nephromegaly with innumerable cysts throughout bilateral kidneys. Some of the cysts demonstrate hyperdense material within them likely reflecting a small amount of hemorrhage. Bilateral renovascular calcifications. Bladder is unremarkable. Stomach/Bowel: Stomach is within normal limits. Appendix appears normal. No evidence of bowel wall thickening, distention, or inflammatory changes. Vascular/Lymphatic: Abdominal aortic atherosclerosis. Normal caliber abdominal aorta. No lymphadenopathy. Reproductive: Uterus and bilateral adnexa are unremarkable. Other: Large volume abdominal ascites.  No abdominal wall hernia. Musculoskeletal: No acute osseous abnormality. No aggressive osseous lesion. Degenerative disc disease disc height loss and a broad-based disc bulge at L5-S1. IMPRESSION: 1. Polycystic kidney disease again noted. 2. Extensive innumerable cysts throughout the liver. 3. Large volume ascites. 4.  Aortic Atherosclerosis (ICD10-I70.0). Electronically Signed   By: Elige Ko   On: 08/04/2017 19:48    EKG:   Orders placed or performed during the hospital encounter of 08/04/17  . ED EKG  . ED EKG  . EKG 12-Lead  . EKG 12-Lead  . EKG 12-Lead  . EKG 12-Lead    ASSESSMENT AND PLAN:   #1 diffuse abdominal pain due to large volume ascites, CT abdomen did not show acute changes.  Recently finished treatment for C. difficile colitis.  Patient is on pain medicine, afebrile, WBC normal.  Start empiric antibiotics for abdominal pain, ascites with possible SBP.  GI consult appreciated.  Patient is n.p.o. but she wants to eat especially if the labs are normal and CT is normal started on patient soft diet . 2.  Hypotension: Patient has chronic hypotension, patient is on Midodrin at home restarted  that. 3.  ESRD: On hemodialysis Monday, Wednesday, Friday.  Last hemodialysis was on Monday, patient missed dialysis on Wednesday, Friday and she is refusing hemodialysis today, Dr. Miguel Aschoff saw the patient.  HD  Permactah not working,appreciate nephro help. 4.  Thrombocytopenia: Chronic. Chronic severe malnutrition: Seen by registered dietitian during admission in January, patient needs to be on Magic cup 3 times daily with meals, vitamin C, renal white, multivitamins.     All the records are reviewed and case discussed with Care Management/Social Workerr. Management plans discussed with the patient, family and they are in agreement.  CODE STATUS: DNR  TOTAL TIME TAKING CARE OF THIS PATIENT: 35 minutes.   POSSIBLE D/C IN 1-2DAYS, DEPENDING ON CLINICAL CONDITION.   Katha Hamming M.D on 08/06/2017 at 12:05 PM  Between 7am to 6pm - Pager - [phone]  After 6pm go to [email or web address] - password [email or web address] [facility]  [facility]  Office  [phone]  CC: Primary care physician; Corky Downs, MD   Note: This dictation was prepared with Dragon dictation along with smaller phrase technology. Any transcriptional errors that result from this process are unintentional.

## 2017-08-06 NOTE — Progress Notes (Signed)
pts o2 in the upper 80's at time of vital sign check. Pt put on 2l of 02 to keep above 92%. Will monitor pt to see if we can tale off

## 2017-08-06 NOTE — Progress Notes (Signed)
[facility]  ROUNDING NOTE   Subjective:   Unable to get hemodialysis yesterday. Catheter not functioning.   Last hemodialysis was 1/28.   Patient states she wants to continue with dialysis.   Objective:  Vital signs in last 24 hours:  Temp:  [97.4 F (36.3 C)-98.4 F (36.9 C)] 97.5 F (36.4 C) (02/03 0624) Pulse Rate:  [72-81] 73 (02/03 0624) Resp:  [11-18] 18 (02/03 0624) BP: (92-101)/(59-74) 92/59 (02/03 0624) SpO2:  [84 %-99 %] 98 % (02/03 0624) Weight:  [52.2 kg (115 lb)] 52.2 kg (115 lb) (02/03 0624)  Weight change: 4.536 kg (10 lb) Filed Weights   08/04/17 1608 08/05/17 0421 08/06/17 0624  Weight: 47.6 kg (105 lb) 48.9 kg (107 lb 12.9 oz) 52.2 kg (115 lb)    Intake/Output: I/O last 3 completed shifts: In: 710 [P.O.:210; IV Piggyback:500] Out: -    Intake/Output this shift:  No intake/output data recorded.  Physical Exam: General: NAD,   Head: Normocephalic, atraumatic. Moist oral mucosal membranes  Eyes: Anicteric, PERRL  Neck: Supple, trachea midline  Lungs:  Clear to auscultation  Heart: Regular rate and rhythm  Abdomen:  Soft, nontender,   Extremities: no peripheral edema.  Neurologic: Nonfocal, moving all four extremities  Skin: No lesions  Access: RIJ permcath    Basic Metabolic Panel: Recent Labs  Lab 08/04/17 1822 08/05/17 1704  NA 139 136  K 5.2* 4.8  CL 102 102  CO2 23 22  GLUCOSE 104* 104*  BUN 29* 30*  CREATININE 10.06* 10.56*  CALCIUM 8.9 8.4*    Liver Function Tests: Recent Labs  Lab 08/04/17 1822  AST 37  ALT 57*  ALKPHOS 98  BILITOT 0.8  PROT 5.2*  ALBUMIN 2.2*   Recent Labs  Lab 08/04/17 1822  LIPASE 23   No results for input(s): AMMONIA in the last 168 hours.  CBC: Recent Labs  Lab 08/04/17 1822 08/05/17 1704  WBC 7.0 7.8  HGB 13.3 12.0  HCT 41.4 36.3  MCV 92.0 90.3  PLT 90* 120*    Cardiac Enzymes: No results for input(s): CKTOTAL, CKMB, CKMBINDEX, TROPONINI in the last 168  hours.  BNP: Invalid input(s): POCBNP  CBG: Recent Labs  Lab 08/05/17 0756 08/05/17 0837 08/05/17 0919 08/06/17 0733  GLUCAP 67 65 124* 90    Microbiology: Results for orders placed or performed during the hospital encounter of 07/11/17  MRSA PCR Screening     Status: None   Collection Time: 07/11/17  8:25 PM  Result Value Ref Range Status   MRSA by PCR NEGATIVE NEGATIVE Final    Comment:        The GeneXpert MRSA Assay (FDA approved for NASAL specimens only), is one component of a comprehensive MRSA colonization surveillance program. It is not intended to diagnose MRSA infection nor to guide or monitor treatment for MRSA infections. Performed at [facility], [address] [place], [place] [address]   C difficile quick scan w PCR reflex     Status: Abnormal   Collection Time: 07/13/17 12:20 AM  Result Value Ref Range Status   C Diff antigen POSITIVE (A) NEGATIVE Final   C Diff toxin NEGATIVE NEGATIVE Final   C Diff interpretation Results are indeterminate. See PCR results.  Final    Comment: Performed at [facility], [address] [place], [place] [address]  Gastrointestinal Panel by PCR , Stool     Status: None   Collection Time: 07/13/17 12:20 AM  Result Value Ref  Range Status   Campylobacter species NOT DETECTED NOT DETECTED Final   Plesimonas shigelloides NOT DETECTED NOT DETECTED Final   Salmonella species NOT DETECTED NOT DETECTED Final   Yersinia enterocolitica NOT DETECTED NOT DETECTED Final   Vibrio species NOT DETECTED NOT DETECTED Final   Vibrio cholerae NOT DETECTED NOT DETECTED Final   Enteroaggregative E coli (EAEC) NOT DETECTED NOT DETECTED Final   Enteropathogenic E coli (EPEC) NOT DETECTED NOT DETECTED Final   Enterotoxigenic E coli (ETEC) NOT DETECTED NOT DETECTED Final   Shiga like toxin producing E coli (STEC) NOT DETECTED NOT DETECTED Final   Shigella/Enteroinvasive E coli (EIEC) NOT DETECTED NOT DETECTED Final    Cryptosporidium NOT DETECTED NOT DETECTED Final   Cyclospora cayetanensis NOT DETECTED NOT DETECTED Final   Entamoeba histolytica NOT DETECTED NOT DETECTED Final   Giardia lamblia NOT DETECTED NOT DETECTED Final   Adenovirus F40/41 NOT DETECTED NOT DETECTED Final   Astrovirus NOT DETECTED NOT DETECTED Final   Norovirus GI/GII NOT DETECTED NOT DETECTED Final   Rotavirus A NOT DETECTED NOT DETECTED Final   Sapovirus (I, II, IV, and V) NOT DETECTED NOT DETECTED Final    Comment: Performed at [facility], [address] [place], [place] [address]  C. Diff by PCR, Reflexed     Status: Abnormal   Collection Time: 07/13/17 12:20 AM  Result Value Ref Range Status   Toxigenic C. Difficile by PCR POSITIVE (A) NEGATIVE Final    Comment: Positive for toxigenic C. difficile with little to no toxin production. Only treat if clinical presentation suggests symptomatic illness. Performed at [facility], [address] [place], [place] [address]     Coagulation Studies: No results for input(s): LABPROT, INR in the last 72 hours.  Urinalysis: No results for input(s): COLORURINE, LABSPEC, PHURINE, GLUCOSEU, HGBUR, BILIRUBINUR, KETONESUR, PROTEINUR, UROBILINOGEN, NITRITE, LEUKOCYTESUR in the last 72 hours.  Invalid input(s): APPERANCEUR    Imaging: Ct Abdomen Pelvis Wo Contrast  Result Date: 08/04/2017 CLINICAL DATA:  Abdominal pain.  Patient unable to have dialysis. EXAM: CT ABDOMEN AND PELVIS WITHOUT CONTRAST TECHNIQUE: Multidetector CT imaging of the abdomen and pelvis was performed following the standard protocol without IV contrast. COMPARISON:  06/01/2017 FINDINGS: Lower chest: Mild right basilar atelectasis. Hepatobiliary: Innumerable cysts throughout the liver with overall hepatomegaly. Again noted is increased density in the 9 cm cyst in the left hepatic lobe anteriorly. Normal gallbladder. Pancreas: Unremarkable. No pancreatic ductal dilatation or surrounding inflammatory  changes. Spleen: Normal in size without focal abnormality. Adrenals/Urinary Tract: Adrenal glands are suboptimally visualized. Bilateral nephromegaly with innumerable cysts throughout bilateral kidneys. Some of the cysts demonstrate hyperdense material within them likely reflecting a small amount of hemorrhage. Bilateral renovascular calcifications. Bladder is unremarkable. Stomach/Bowel: Stomach is within normal limits. Appendix appears normal. No evidence of bowel wall thickening, distention, or inflammatory changes. Vascular/Lymphatic: Abdominal aortic atherosclerosis. Normal caliber abdominal aorta. No lymphadenopathy. Reproductive: Uterus and bilateral adnexa are unremarkable. Other: Large volume abdominal ascites.  No abdominal wall hernia. Musculoskeletal: No acute osseous abnormality. No aggressive osseous lesion. Degenerative disc disease disc height loss and a broad-based disc bulge at L5-S1. IMPRESSION: 1. Polycystic kidney disease again noted. 2. Extensive innumerable cysts throughout the liver. 3. Large volume ascites. 4.  Aortic Atherosclerosis (ICD10-I70.0). Electronically Signed   By: Elige KoHetal  Patel   On: 08/04/2017 19:48     Medications:   . ciprofloxacin 400 mg (08/06/17 1142)   . aspirin EC  81 mg Oral Daily  . dicyclomine  10 mg Oral TID AC  . docusate sodium  100 mg Oral BID  . heparin  5,000 Units Subcutaneous Q8H  . latanoprost  1 drop Both Eyes QHS  . midodrine  10 mg Oral TID WC  . multivitamin  1 tablet Oral QHS   acetaminophen **OR** acetaminophen, bisacodyl, HYDROcodone-acetaminophen, ondansetron **OR** ondansetron (ZOFRAN) IV, traZODone  Assessment/ Plan:  Ms. Melissa Bradshaw is a 67 y.o. black female with end stage renal disease on hemodialysis, anemia, hypertension, polycystic kidney disease, history of pacemaker, history of TIA, vertebral artery aneurysm status post coiling  MWF [facility] [address] Left AVG EDW 54.5kg  1. End-stage renal disease: Last  hemodialysis was 1/28.  Complication of dialysis device. AVF not able to stay patent due to hypotension. Patient with nonfunctioning tunneled catheter.  - Consult vascular surgery.  No acute indication for dialysis at this time.  Resume MWF schedule. - Continue end of life discussion   2. Hypertension: hypotensive: Not currently taking any blood pressure agents.   3. Anemia chronic kidney disease:  Hemoglobin 12 - holding EPO  4. Secondary hyperparathyroidism: with hyperphosphatemia and hypocalcemia. Holding cinacalcet and calcium acetate   LOS: 2 Jasper Hanf 2/3/201912:23 PM

## 2017-08-06 NOTE — Clinical Social Work Note (Signed)
CSW received consult that patient and family are interested in pursuing STR. CSW has requested that the physician order PT as required for insurance to approve. CSW will follow pending PT recommendations.  Argentina PonderKaren Martha Annslee Tercero, MSW, Theresia MajorsLCSWA [phone]

## 2017-08-06 NOTE — Consult Note (Signed)
[name] Vascular Consult Note  MRN : [id number]  Melissa Bradshaw is a 67 y.o. (Jul 22, 1950) female who presents with chief complaint of  Chief Complaint  Patient presents with  . Hypotension  . Abdominal Pain  .  History of Present Illness: I am asked to evaluate the patient by Dr. Wynelle Link.  The patient is a 67 year old woman with end-stage renal disease on hemodialysis that is well-known to our service.  She was noted to have a clotted access toward the end of December.  Unfortunately she was unable to follow-up as planned for her thrombectomy.  Ultimately a catheter was placed on December 20 and subsequently needed to be replaced a day or 2 later.  She dialyzed well until July 31, 2017.  That was her last hemodialysis.  The patient denies shortness of breath no new cough.  No fever chills.  Current Meds  Medication Sig  . aspirin 81 MG tablet Take 81 mg by mouth daily.  . calcium acetate (PHOSLO) 667 MG capsule Take 1,334 mg by mouth 3 (three) times daily with meals.   . dicyclomine (BENTYL) 10 MG capsule Take 1 capsule (10 mg total) by mouth 3 (three) times daily before meals.  . docusate sodium (COLACE) 100 MG capsule Take 1 capsule (100 mg total) by mouth 2 (two) times daily. (Patient taking differently: Take 100 mg by mouth daily as needed. )  . latanoprost (XALATAN) 0.005 % ophthalmic solution Place 1 drop into both eyes at bedtime.  . megestrol (MEGACE) 400 MG/10ML suspension Take 10 mLs (400 mg total) by mouth 2 (two) times daily.  . midodrine (PROAMATINE) 10 MG tablet Take 1 tablet (10 mg total) by mouth 3 (three) times daily with meals.  . multivitamin (RENA-VIT) TABS tablet Take 1 tablet by mouth at bedtime.  . ondansetron (ZOFRAN ODT) 4 MG disintegrating tablet Take 1 tablet (4 mg total) by mouth every 8 (eight) hours as needed for nausea or vomiting.    Past Medical History:  Diagnosis Date  . Anemia   . Aneurysm (HCC)    Cerebral, [place]  . Arthritis   . Cardiac pacemaker   . CHF (congestive heart failure) (HCC)   . Chronic kidney disease    ESRD  . Constipation   . Coronary artery disease   . Dialysis patient (HCC)    Tues, Thurs, Sat  . Gastric ulcer   . Glaucoma (increased eye pressure)   . Headache   . Hypertension   . Presence of permanent cardiac pacemaker     Past Surgical History:  Procedure Laterality Date  . ANEURYSM COILING    . AV FISTULA PLACEMENT Left 07/20/2016   Procedure: ARTERIOVENOUS (AV) FISTULA CREATION;  Surgeon: Renford Dills, MD;  Location: [facility];  Service: Vascular;  Laterality: Left;  . AV FISTULA PLACEMENT Left 08/26/2016   Procedure: INSERTION OF ARTERIOVENOUS (AV) GORE-TEX GRAFT ARM;  Surgeon: Renford Dills, MD;  Location: [facility];  Service: Vascular;  Laterality: Left;  . COLONOSCOPY     2013  . DIALYSIS/PERMA CATHETER INSERTION N/A 06/22/2017   Procedure: DIALYSIS/PERMA CATHETER INSERTION;  Surgeon: Annice Needy, MD;  Location: [facility];  Service: Cardiovascular;  Laterality: N/A;  . DIALYSIS/PERMA CATHETER INSERTION N/A 06/23/2017   Procedure: DIALYSIS/PERMA CATHETER INSERTION;  Surgeon: Annice Needy, MD;  Location: [facility];  Service: Cardiovascular;  Laterality: N/A;  . DIALYSIS/PERMA CATHETER REMOVAL N/A 10/11/2016   Procedure: Dialysis/Perma  Catheter Removal;  Surgeon: Renford Dills, MD;  Location: [facility];  Service: Cardiovascular;  Laterality: N/A;  . INSERT / REPLACE / REMOVE PACEMAKER    . IR RADIOLOGIST EVAL & MGMT  05/18/2017  . PACEMAKER PLACEMENT    . PERIPHERAL VASCULAR CATHETERIZATION N/A 06/02/2016   Procedure: Dialysis/Perma Catheter Insertion;  Surgeon: Annice Needy, MD;  Location: [facility];  Service: Cardiovascular;  Laterality: N/A;    Social History Social History   Tobacco Use  . Smoking status: Current Every Day Smoker    Packs/day: 0.25    Years: 28.00    Pack years: 7.00     Types: Cigarettes  . Smokeless tobacco: Never Used  . Tobacco comment: pt lives with people who smoke in house  Substance Use Topics  . Alcohol use: No  . Drug use: No    Family History Family History  Problem Relation Age of Onset  . Hypertension Mother   . Diabetes Father   . Cancer Sister   . Stroke Sister   . Hypertension Sister   No family history of bleeding/clotting disorders, porphyria or autoimmune disease   Allergies  Allergen Reactions  . Hydralazine Hcl Other (See Comments)    Makes pt jittery, nervous, messes with vision   . Iodine Itching and Rash    Unknown reaction  . Ivp Dye [Iodinated Diagnostic Agents] Rash     REVIEW OF SYSTEMS (Negative unless checked)  Constitutional: [] Weight loss  [] Fever  [] Chills Cardiac: [] Chest pain   [] Chest pressure   [] Palpitations   [] Shortness of breath when laying flat   [] Shortness of breath at rest   [] Shortness of breath with exertion. Vascular:  [] Pain in legs with walking   [] Pain in legs at rest   [] Pain in legs when laying flat   [] Claudication   [] Pain in feet when walking  [] Pain in feet at rest  [] Pain in feet when laying flat   [] History of DVT   [] Phlebitis   [] Swelling in legs   [] Varicose veins   [] Non-healing ulcers Pulmonary:   [] Uses home oxygen   [] Productive cough   [] Hemoptysis   [] Wheeze  [] COPD   [] Asthma Neurologic:  [] Dizziness  [] Blackouts   [] Seizures   [] History of stroke   [] History of TIA  [] Aphasia   [] Temporary blindness   [] Dysphagia   [] Weakness or numbness in arms   [] Weakness or numbness in legs Musculoskeletal:  [] Arthritis   [] Joint swelling   [] Joint pain   [] Low back pain Hematologic:  [] Easy bruising  [] Easy bleeding   [] Hypercoagulable state   [] Anemic  [] Hepatitis Gastrointestinal:  [] Blood in stool   [] Vomiting blood  [] Gastroesophageal reflux/heartburn   [] Difficulty swallowing. Genitourinary:  [x] Chronic kidney disease   [] Difficult urination  [] Frequent urination  [] Burning with  urination   [] Blood in urine Skin:  [] Rashes   [] Ulcers   [] Wounds Psychological:  [] History of anxiety   []  History of major depression.  Physical Examination  Vitals:   08/05/17 1715 08/05/17 2052 08/06/17 0624 08/06/17 1331  BP: 96/66 92/68 (!) 92/59 (!) 88/61  Pulse:  81 73 65  Resp: 13 18 18 16   Temp:  98.4 F (36.9 C) (!) 97.5 F (36.4 C) 98.2 F (36.8 C)  TempSrc:  Oral Oral Oral  SpO2:  (!) 84% 98% 100%  Weight:   115 lb (52.2 kg)   Height:       Body mass index is 18.56 kg/m. Gen:  WD/WN,  NAD Head: /AT, No temporalis wasting. Prominent temp pulse not noted. Ear/Nose/Throat: Hearing grossly intact, nares w/o erythema or drainage, oropharynx w/o Erythema/Exudate Eyes: PERRLA, EOMI.  Neck: Supple, no nuchal rigidity.  No bruit or JVD.  Pulmonary:  Good air movement, clear to auscultation bilaterally.  Cardiac: RRR, normal S1, S2, no Murmurs, rubs or gallops. Vascular: Right IJ tunnel catheter clean dry and intact left brachial axillary AV graft no thrill no bruit skin overlying the graft is intact and appears healthy Vessel Right Left  Radial Palpable Palpable  Ulnar Palpable Palpable  Brachial Palpable Palpable  DP Palpable Palpable  Gastrointestinal: soft, non-tender/non-distended. No guarding/reflex. No masses, surgical incisions, or scars. Musculoskeletal: M/S 5/5 throughout.  Extremities without ischemic changes.  No deformity or atrophy. No edema. Neurologic: CN 2-12 intact. Pain and light touch intact in extremities.  Symmetrical.  Speech is fluent. Motor exam as listed above. Psychiatric: Judgment intact, Mood & affect appropriate for pt's clinical situation. Dermatologic: No rashes or ulcers noted.  No cellulitis or open wounds. Lymph : No Cervical, Axillary, or Inguinal lymphadenopathy.   CBC Lab Results  Component Value Date   WBC 7.8 08/05/2017   HGB 12.0 08/05/2017   HCT 36.3 08/05/2017   MCV 90.3 08/05/2017   PLT 120 (L) 08/05/2017    BMET     Component Value Date/Time   NA 136 08/05/2017 1704   NA 141 01/30/2014 1505   K 4.8 08/05/2017 1704   K 5.0 01/30/2014 1505   CL 102 08/05/2017 1704   CL 116 (H) 01/30/2014 1505   CO2 22 08/05/2017 1704   CO2 17 (L) 01/30/2014 1505   GLUCOSE 104 (H) 08/05/2017 1704   GLUCOSE 93 01/30/2014 1505   BUN 30 (H) 08/05/2017 1704   BUN 50 (H) 06/19/2014 1413   CREATININE 10.56 (H) 08/05/2017 1704   CREATININE 3.33 (H) 06/19/2014 1413   CALCIUM 8.4 (L) 08/05/2017 1704   CALCIUM 8.8 01/30/2014 1505   GFRNONAA 3 (L) 08/05/2017 1704   GFRNONAA 15 (L) 06/19/2014 1413   GFRNONAA 14 (L) 01/30/2014 1505   GFRAA 4 (L) 08/05/2017 1704   GFRAA 18 (L) 06/19/2014 1413   GFRAA 16 (L) 01/30/2014 1505   Estimated Creatinine Clearance: 4.3 mL/min (A) (by C-G formula based on SCr of 10.56 mg/dL (H)).  COAG Lab Results  Component Value Date   INR 1.02 06/21/2017   INR 0.95 06/14/2017   INR 1.11 07/13/2016    Radiology Ct Abdomen Pelvis Wo Contrast  Result Date: 08/04/2017 CLINICAL DATA:  Abdominal pain.  Patient unable to have dialysis. EXAM: CT ABDOMEN AND PELVIS WITHOUT CONTRAST TECHNIQUE: Multidetector CT imaging of the abdomen and pelvis was performed following the standard protocol without IV contrast. COMPARISON:  06/01/2017 FINDINGS: Lower chest: Mild right basilar atelectasis. Hepatobiliary: Innumerable cysts throughout the liver with overall hepatomegaly. Again noted is increased density in the 9 cm cyst in the left hepatic lobe anteriorly. Normal gallbladder. Pancreas: Unremarkable. No pancreatic ductal dilatation or surrounding inflammatory changes. Spleen: Normal in size without focal abnormality. Adrenals/Urinary Tract: Adrenal glands are suboptimally visualized. Bilateral nephromegaly with innumerable cysts throughout bilateral kidneys. Some of the cysts demonstrate hyperdense material within them likely reflecting a small amount of hemorrhage. Bilateral renovascular calcifications. Bladder  is unremarkable. Stomach/Bowel: Stomach is within normal limits. Appendix appears normal. No evidence of bowel wall thickening, distention, or inflammatory changes. Vascular/Lymphatic: Abdominal aortic atherosclerosis. Normal caliber abdominal aorta. No lymphadenopathy. Reproductive: Uterus and bilateral adnexa are unremarkable. Other: Large volume abdominal ascites.  No abdominal wall hernia. Musculoskeletal: No acute osseous abnormality. No aggressive osseous lesion. Degenerative disc disease disc height loss and a broad-based disc bulge at L5-S1. IMPRESSION: 1. Polycystic kidney disease again noted. 2. Extensive innumerable cysts throughout the liver. 3. Large volume ascites. 4.  Aortic Atherosclerosis (ICD10-I70.0). Electronically Signed   By: Elige Ko   On: 08/04/2017 19:48   US Abdomen Limited  Result Date: 07/25/2017 CLINICAL DATA:  End-stage renal disease, ascites, abdominal distension EXAM: LIMITED ABDOMEN ULTRASOUND FOR ASCITES TECHNIQUE: Limited ultrasound survey for ascites was performed in all four abdominal quadrants. COMPARISON:  07/18/2017 FINDINGS: Small amount of ascites in the right lower quadrant. Not enough to warrant therapeutic paracentesis today. Patient agrees with this. Chronic polycystic kidney disease noted. IMPRESSION: Small amount of right lower quadrant ascites. Paracentesis will be deferred. Electronically Signed   By: Judie Petit.  Shick M.D.   On: 07/25/2017 11:53   US Abdomen Limited  Result Date: 07/18/2017 CLINICAL DATA:  Polycystic disease involving kidneys in liver. Recurrent abdominal ascites and abdominal distention. EXAM: LIMITED ABDOMEN ULTRASOUND FOR ASCITES TECHNIQUE: Limited ultrasound survey for ascites was performed in all four abdominal quadrants. COMPARISON:  07/05/2017 FINDINGS: There is a small amount of scattered ascites in the lower abdomen right greater than left. No significant large pocket is identified for large volume therapeutic paracentesis, which was  therefore deferred. IMPRESSION: 1. Small amount of residual/recurrent abdominal ascites. No large pocket for large volume therapeutic paracentesis. Electronically Signed   By: Corlis Leak M.D.   On: 07/18/2017 10:58   US Paracentesis  Result Date: 08/02/2017 INDICATION: 67 year old female with a history of recurrent ascites EXAM: ULTRASOUND GUIDED  PARACENTESIS MEDICATIONS: None. COMPLICATIONS: None PROCEDURE: Informed written consent was obtained from the patient after a discussion of the risks, benefits and alternatives to treatment. A timeout was performed prior to the initiation of the procedure. Initial ultrasound scanning demonstrates a large amount of ascites within the right lower abdominal quadrant. The right lower abdomen was prepped and draped in the usual sterile fashion. 1% lidocaine with epinephrine was used for local anesthesia. Following this, a 8 Fr Safe-T-Centesis catheter was introduced. An ultrasound image was saved for documentation purposes. The paracentesis was performed. The catheter was removed and a dressing was applied. The patient tolerated the procedure well without immediate post procedural complication. FINDINGS: A total of approximately 3.6 L of thin yellow fluid was removed. IMPRESSION: Status post ultrasound-guided paracentesis. Signed, Yvone Neu. Loreta Ave, DO [facility] Electronically Signed   By: Gilmer Mor D.O.   On: 08/02/2017 15:05   Dg Chest Port 1 View  Result Date: 07/11/2017 CLINICAL DATA:  Weakness, shortness of Breath EXAM: PORTABLE CHEST 1 VIEW COMPARISON:  06/21/2017 FINDINGS: Interval placement of right dialysis catheter with the tip in the right atrium. Left pacer is unchanged. Heart is normal size. Lungs are clear. No effusions or acute bony abnormality. IMPRESSION: No active disease. Electronically Signed   By: Charlett Nose M.D.   On: 07/11/2017 15:54   Dg Abd 2 Views  Result Date:  07/16/2017 CLINICAL DATA:  Emesis for 2-3 days. Chronic kidney disease. Chronic dialysis. EXAM: ABDOMEN - 2 VIEW COMPARISON:  CT abdomen 06/01/2017 FINDINGS: Visualized bowel gas is primarily in the right displacement of bowel structures by the patient's known polycystic kidney disease and polycystic liver disease, currently there is gas in the right colon and some gas projecting the bowel is displaced by the patient's enlarged liver and kidneys; the patient has  known polycystic liver disease and polycystic kidney disease. There is a hazy density along the abdomen quite likely indicating ascites, with the enlarged liver and kidneys likely prevent in the normal centralization of bowel loops that occurs in ascites. Curvilinear calcifications along both kidneys are chronically stable. Aortoiliac atherosclerotic vascular disease. Dialysis catheter tip projects over the right atrium. Subsegmental atelectasis or scarring along both hemidiaphragms. IMPRESSION: 1. No visualized dilated bowel. 2. Known polycystic liver and polycystic kidney disease, with the enlarged liver and kidneys displacing bowel peripherally and towards the pelvis. The relatively gasless appearance adversely affects negative predictive value. 3. Hazy opacity over the abdomen likely reflecting ascites. 4. Mild bibasilar atelectasis. 5.  Aortic Atherosclerosis (ICD10-I70.0). Electronically Signed   By: Gaylyn Rong M.D.   On: 07/16/2017 16:54     Assessment/Plan 1.  Complication dialysis device with thrombosis AV access:  Patient's left brachial axillary dialysis access is thrombosed. The patient will undergo thrombectomy this Tuesday using interventional techniques. Potassium will be drawn prior to the procedure to ensure that it is an appropriate level prior to performing thrombectomy. 2.  End-stage renal disease requiring hemodialysis:  Patient will continue dialysis therapy with minimal interruption and therefore tomorrow TPA infusion for  catheter clearance will be performed so the dialysis can be restarted prior to her thrombectomy. Dialysis has already been arranged since the patient missed their previous session 3.  Hypertension:  Patient will continue medical management; nephrology is following no changes in oral medications. 4. Diabetes mellitus:  Glucose will be monitored and oral medications been held this morning once the patient has undergone the patient's procedure po intake will be reinitiated and again Accu-Cheks will be used to assess the blood glucose level and treat as needed. The patient will be restarted on the patient's usual hypoglycemic regime 5.  Coronary artery disease:  EKG will be monitored. Nitrates will be used if needed. The patient's oral cardiac medications will be continued.     Levora Dredge, MD  08/06/2017 2:51 PM

## 2017-08-06 NOTE — Progress Notes (Signed)
Initial Nutrition Assessment  DOCUMENTATION CODES:   Severe malnutrition in context of chronic illness  INTERVENTION:  Provide Boost Breeze po TID, each supplement provides 250 kcal and 9 grams of protein.  Provide Magic cup TID with meals, each supplement provides 290 kcal and 9 grams of protein.  Continue Rena-vite po QHS.  Provide MVI po twice weekly.  Provide Vitamin C 500 mg po BID.  Encouraged adequate intake of calories and protein from small, frequent meals and ONS.  NUTRITION DIAGNOSIS:   Severe Malnutrition related to chronic illness(CHF, ESRD on HD, abdominal pain and nausea causing poor PO intake) as evidenced by severe fat depletion, severe muscle depletion.  GOAL:   Patient will meet greater than or equal to 90% of their needs  MONITOR:   PO intake, Supplement acceptance, Labs, Weight trends, Skin, I & O's  REASON FOR ASSESSMENT:   Malnutrition Screening Tool, Consult Assessment of nutrition requirement/status, Poor PO  ASSESSMENT:   67 year old female with PMHx of anemia, HTN, hx gastric ulcer, CAD, CHF, s/p placement of cardiac pacemaker, ESRD on HD, polycystic kidney disease, constipation, glaucoma admitted with diffuse abdominal pain due to large volume ascites, also HD catheter not working.   Met with patient at bedside. She reports she has had a poor appetite for months now. She has had abdominal pain and nausea for years now. She reports her sister had similar abdominal pain. Recently started HD at the end of 2018. She is eating very little PO. She may have a small snack or bites at meals. She loves ice cream and she has been craving cold drinks. Discussed that patient is currently on a soft (easy to digest) diet. She is amenable to trying [employer] and [employer]. She cannot recall if she was taking Rena-vite or vitamin C at home.  Per Dr. Assunta Gambles note EDW 54.5 kg. Patient reports her UBW was 118 lbs (53.6 kg). Difficult to trend weight loss as  weights in chart fluctuate.  Meal Completion: 0% of breakfast this morning.  Medications reviewed and include: Colace, Rena-vite QHS, ciprofloxacin.  Labs reviewed: CBG 65-124, BUN 30, Creatinine 10.56. Potassium 4.8. No recent Phosphorus.  NUTRITION - FOCUSED PHYSICAL EXAM:    Most Recent Value  Orbital Region  Severe depletion  Upper Arm Region  Severe depletion  Thoracic and Lumbar Region  Severe depletion  Buccal Region  Severe depletion  Temple Region  Severe depletion  Clavicle Bone Region  Severe depletion  Clavicle and Acromion Bone Region  Severe depletion  Scapular Bone Region  Severe depletion  Dorsal Hand  Severe depletion  Patellar Region  Severe depletion  Anterior Thigh Region  Severe depletion  Posterior Calf Region  Severe depletion  Edema (RD Assessment)  Mild  Hair  Reviewed  Eyes  Reviewed  Mouth  Reviewed  Skin  Reviewed  Nails  Reviewed     Diet Order:  DIET SOFT Room service appropriate? Yes; Fluid consistency: Thin  EDUCATION NEEDS:   Education needs have been addressed  Skin:  Skin Assessment: Reviewed RN Assessment  Last BM:  08/05/2017  Height:   Ht Readings from Last 1 Encounters:  08/04/17 5' 6"  (1.676 m)    Weight:   Wt Readings from Last 1 Encounters:  08/06/17 115 lb (52.2 kg)    Ideal Body Weight:  59.1 kg  BMI:  Body mass index is 18.56 kg/m.  Estimated Nutritional Needs:   Kcal:  1565-1825 (30-35 kcal/kg)  Protein:  78-88 grams (1.5-1.7  grams/kg)  Fluid:  UOP + 1 L  Willey Blade, MS, RD, LDN Office: [phone] Pager: [phone] After Hours/Weekend Pager: [phone]

## 2017-08-07 ENCOUNTER — Inpatient Hospital Stay: Payer: Medicare Other

## 2017-08-07 ENCOUNTER — Other Ambulatory Visit (INDEPENDENT_AMBULATORY_CARE_PROVIDER_SITE_OTHER): Payer: Self-pay | Admitting: Vascular Surgery

## 2017-08-07 DIAGNOSIS — J9601 Acute respiratory failure with hypoxia: Secondary | ICD-10-CM

## 2017-08-07 LAB — MRSA PCR SCREENING: MRSA by PCR: NEGATIVE

## 2017-08-07 LAB — GLUCOSE, CAPILLARY
GLUCOSE-CAPILLARY: 40 mg/dL — AB (ref 65–99)
GLUCOSE-CAPILLARY: 44 mg/dL — AB (ref 65–99)
GLUCOSE-CAPILLARY: 80 mg/dL (ref 65–99)
Glucose-Capillary: 44 mg/dL — CL (ref 65–99)

## 2017-08-07 MED ORDER — ALTEPLASE 100 MG IV SOLR
5.0000 mg | Freq: Once | INTRAVENOUS | Status: AC
  Administered 2017-08-07: 5 mg via INTRAVENOUS
  Filled 2017-08-07: qty 5

## 2017-08-07 MED ORDER — DEXTROSE 50 % IV SOLN
INTRAVENOUS | Status: AC
  Administered 2017-08-07: 50 mL via INTRAVENOUS
  Filled 2017-08-07: qty 50

## 2017-08-07 MED ORDER — DEXTROSE 50 % IV SOLN: 25.0000 mL | Freq: Once | INTRAVENOUS | Status: DC

## 2017-08-07 MED ORDER — SODIUM CHLORIDE 0.9 % IV SOLN: 0.5000 mg/h | Freq: Once | INTRAVENOUS | Status: DC

## 2017-08-07 MED ORDER — HYDROMORPHONE HCL 1 MG/ML IJ SOLN
INTRAMUSCULAR | Status: AC
  Administered 2017-08-07: 0.5 mg via SUBCUTANEOUS
  Filled 2017-08-07: qty 1

## 2017-08-07 MED ORDER — SODIUM CHLORIDE 0.9 % IV SOLN
5.0000 mg | Freq: Once | INTRAVENOUS | Status: AC
  Administered 2017-08-07: 5 mg via INTRAVENOUS
  Filled 2017-08-07: qty 3

## 2017-08-07 MED ORDER — LORAZEPAM 2 MG/ML IJ SOLN: 0.5000 mg | INTRAMUSCULAR | Status: DC | PRN

## 2017-08-07 MED ORDER — DEXTROSE 50 % IV SOLN
50.0000 mL | Freq: Once | INTRAVENOUS | Status: AC
Start: 2017-08-07 — End: 2017-08-07
  Administered 2017-08-07: 50 mL via INTRAVENOUS
  Filled 2017-08-07: qty 50

## 2017-08-07 MED ORDER — MORPHINE SULFATE (PF) 2 MG/ML IV SOLN
INTRAVENOUS | Status: AC
  Administered 2017-08-07: 2 mg via INTRAVENOUS
  Filled 2017-08-07: qty 1

## 2017-08-07 MED ORDER — HYDROMORPHONE HCL 1 MG/ML IJ SOLN: 0.5000 mg | Freq: Once | INTRAMUSCULAR | Status: DC

## 2017-08-07 MED ORDER — HYDROMORPHONE HCL 1 MG/ML IJ SOLN
0.5000 mg | INTRAMUSCULAR | Status: DC | PRN
  Administered 2017-08-07: 0.5 mg via SUBCUTANEOUS

## 2017-08-07 MED ORDER — MORPHINE SULFATE (PF) 2 MG/ML IV SOLN
2.0000 mg | Freq: Once | INTRAVENOUS | Status: AC
  Administered 2017-08-07: 2 mg via INTRAVENOUS

## 2017-08-07 NOTE — Progress Notes (Signed)
Pre HD assessment  

## 2017-08-07 NOTE — Progress Notes (Signed)
[facility]  Patient Status: Active  [facility] is providing the following services: SN  If patient discharges after hours, please call [phone].   Melissa CaseyJason E Bradshaw 08/07/2017, 10:43 AM

## 2017-08-07 NOTE — Progress Notes (Signed)
eLink Physician-Brief Progress Note Patient Name: Melissa SchroederCaroline J Moncada DOB: 05/02/1951 MRN: [id number]   Date of Service  08/07/2017  HPI/Events of Note  67 y.o. female with end stage renal disease on hemodialysis, anemia, hypertension, polycystic kidney disease, history of pacemaker, history of TIA, vertebral artery aneurysm status post coiling. Episode of hypotension and hypoxia on HD. Now admitted to ICU. PCCM asked to assume care of patient in the ICU. BP = 100/72. Her BP is chronically low and she is on Midodrine. Sat = 97% and RR = 21.   eICU Interventions  No new orders.      Intervention Category Evaluation Type: New Patient Evaluation  Lenell AntuSommer,Steven Eugene 08/07/2017, 10:46 PM

## 2017-08-07 NOTE — Progress Notes (Signed)
[facility]  at [facility]   PATIENT NAME: Melissa Bradshaw    MR#:  [id number]  DATE OF BIRTH:  03/30/1951  SUBJECTIVE: Does have chronic abdominal pain, patient had TPA infusion for permacath infiltration, now her Enrigue Catenapermacath is working well.  CHIEF COMPLAINT:   Chief Complaint  Patient presents with  . Hypotension  . Abdominal Pain    REVIEW OF SYSTEMS:   ROS CONSTITUTIONAL: No fever,  But has fatigue or weakness.  EYES: No blurred or double vision.  EARS, NOSE, AND THROAT: No tinnitus or ear pain.  RESPIRATORY: No cough, shortness of breath, wheezing or hemoptysis.  CARDIOVASCULAR: No chest pain, orthopnea, edema.  GASTROINTESTINAL: No nausea, vomiting, diarrhea. chronic abdominal pain but got worse recently.  Denies any fever.  GENITOURINARY: No dysuria, hematuria.  ENDOCRINE: No polyuria, nocturia,  HEMATOLOGY: No anemia, easy bruising or bleeding SKIN: No rash or lesion. MUSCULOSKELETAL: No joint pain or arthritis.   NEUROLOGIC: No tingling, numbness, weakness.  PSYCHIATRY: No anxiety or depression.   DRUG ALLERGIES:   Allergies  Allergen Reactions  . Hydralazine Hcl Other (See Comments)    Makes pt jittery, nervous, messes with vision   . Iodine Itching and Rash    Unknown reaction  . Ivp Dye [Iodinated Diagnostic Agents] Rash    VITALS:  Blood pressure 106/73, pulse 65, temperature 98.3 F (36.8 C), temperature source Oral, resp. rate 20, height 5\' 6"  (1.676 m), weight 49 kg (108 lb 1.6 oz), SpO2 100 %.  PHYSICAL EXAMINATION:  GENERAL:  67 y.o.-year-old patient lying in the bed with no acute distress.  Appears chronically ill, cachectic. EYES: Pupils equal, round, reactive to light . No scleral icterus. Extraocular muscles intact.  HEENT: Head atraumatic, normocephalic. Oropharynx and nasopharynx clear.  NECK:  Supple, no jugular venous distention. No thyroid enlargement, no tenderness.  LUNGS: Normal breath sounds bilaterally,  no wheezing, rales,rhonchi or crepitation. No use of accessory muscles of respiration.  CARDIOVASCULAR: S1, S2 normal. No murmurs, rubs, or gallops.  ABDOMEN: Soft, nontender, distended with ascites. Bowel sounds present. No organomegaly or mass.  Nontender on exam.  Does have umbilical hernia. EXTREMITIES: Trace pedal edema, no cyanosis, or clubbing.  NEUROLOGIC: Cranial nerves II through XII are intact. Muscle strength 5/5 in all extremities. Sensation intact. Gait not checked.  PSYCHIATRIC: The patient is alert and oriented x 3.  SKIN: No obvious rash, lesion, or ulcer.    LABORATORY PANEL:   CBC Recent Labs  Lab 08/05/17 1704  WBC 7.8  HGB 12.0  HCT 36.3  PLT 120*   ------------------------------------------------------------------------------------------------------------------  Chemistries  Recent Labs  Lab 08/04/17 1822 08/05/17 1704  NA 139 136  K 5.2* 4.8  CL 102 102  CO2 23 22  GLUCOSE 104* 104*  BUN 29* 30*  CREATININE 10.06* 10.56*  CALCIUM 8.9 8.4*  AST 37  --   ALT 57*  --   ALKPHOS 98  --   BILITOT 0.8  --    ------------------------------------------------------------------------------------------------------------------  Cardiac Enzymes No results for input(s): TROPONINI in the last 168 hours. ------------------------------------------------------------------------------------------------------------------  RADIOLOGY:  No results found.  EKG:   Orders placed or performed during the hospital encounter of 08/04/17  . ED EKG  . ED EKG  . EKG 12-Lead  . EKG 12-Lead  . EKG 12-Lead  . EKG 12-Lead    ASSESSMENT AND PLAN:   #1 diffuse abdominal pain due to large volume ascites, CT abdomen did not show acute changes.  Recently finished treatment  for C. difficile colitis.  Patient is on pain medicine, afebrile, WBC normal.  Startedempiric antibiotics for abdominal pain, ascites with possible SBP.  GI consult appreciated.  Patient is n.p.o. but she  wants to eat especially if the labs are normal and CT is normal started on patient soft diet . 2.  Hypotension: Patient has chronic hypotension, patient is on Midodrin at home restarted that. 3.  ESRD: On hemodialysis Monday, Wednesday, Friday.  Last hemodialysis was on Monday, patient missed dialysis on Wednesday, Friday and she is refusing hemodialysis today, Dr. Miguel Aschoff saw the patient.  HD   Status post thrombolysis of permacath, no w  working well. 4.  Thrombocytopenia: Chronic. Chronic severe malnutrition: Seen by registered dietitian during admission in January, patient needs to be on Magic cup 3 times daily with meals, vitamin C, renal white, multivitamins.     All the records are reviewed and case discussed with Care Management/Social Workerr. Management plans discussed with the patient, family and they are in agreement.  CODE STATUS: DNR  TOTAL TIME TAKING CARE OF THIS PATIENT: 35 minutes.   POSSIBLE D/C IN 1-2DAYS, DEPENDING ON CLINICAL CONDITION.   Katha Hamming M.D on 08/07/2017 at 3:06 PM  Between 7am to 6pm - Pager - [phone]  After 6pm go to [email or web address] - password [email or web address]  [facility]  Office  [phone]  CC: Primary care physician; Corky Downs, MD   Note: This dictation was prepared with Dragon dictation along with smaller phrase technology. Any transcriptional errors that result from this process are unintentional.

## 2017-08-07 NOTE — Progress Notes (Signed)
[facility] [place], [place] 08/07/17  Subjective:   Patient had tPA declot of cathter feels well at present No acute c/o except chronic abdominal pain  Objective:  Vital signs in last 24 hours:  Temp:  [97.5 F (36.4 C)-98.6 F (37 C)] 98.3 F (36.8 C) (02/04 1132) Pulse Rate:  [61-103] 103 (02/04 1539) Resp:  [15-25] 20 (02/04 1430) BP: (89-115)/(60-88) 100/62 (02/04 1539) SpO2:  [75 %-100 %] 89 % (02/04 1539) Weight:  [49 kg (108 lb 1.6 oz)] 49 kg (108 lb 1.6 oz) (02/04 0656)  Weight change: -3.13 kg (-14.4 oz) Filed Weights   08/05/17 0421 08/06/17 0624 08/07/17 0656  Weight: 48.9 kg (107 lb 12.9 oz) 52.2 kg (115 lb) 49 kg (108 lb 1.6 oz)    Intake/Output:    Intake/Output Summary (Last 24 hours) at 08/07/2017 1618 Last data filed at 08/07/2017 0758 Gross per 24 hour  Intake 60 ml  Output 0 ml  Net 60 ml     Physical Exam: General: Laying in bed, thin cachectic  HEENT Moist oral mucus membranes  Neck supple  Pulm/lungs Clear b/l  CVS/Heart No rub, regular  Abdomen:  Soft, Palpable liver  Extremities: 1+ edema  Neurologic: Alert, oreinted  Skin: No acute rashes  Access: Rt IJ PC, left thrombosed AVG       Basic Metabolic Panel:  Recent Labs  Lab 08/04/17 1822 08/05/17 1704  NA 139 136  K 5.2* 4.8  CL 102 102  CO2 23 22  GLUCOSE 104* 104*  BUN 29* 30*  CREATININE 10.06* 10.56*  CALCIUM 8.9 8.4*     CBC: Recent Labs  Lab 08/04/17 1822 08/05/17 1704  WBC 7.0 7.8  HGB 13.3 12.0  HCT 41.4 36.3  MCV 92.0 90.3  PLT 90* 120*      Lab Results  Component Value Date   HEPBSAG Negative 07/11/2017   HEPBSAB Non Reactive 06/02/2016      Microbiology:  No results found for this or any previous visit (from the past 240 hour(s)).  Coagulation Studies: No results for input(s): LABPROT, INR in the last 72 hours.  Urinalysis: No results for input(s): COLORURINE, LABSPEC, PHURINE, GLUCOSEU, HGBUR, BILIRUBINUR, KETONESUR,  PROTEINUR, UROBILINOGEN, NITRITE, LEUKOCYTESUR in the last 72 hours.  Invalid input(s): APPERANCEUR    Imaging: No results found.   Medications:   . ciprofloxacin Stopped (08/06/17 1239)   . aspirin EC  81 mg Oral Daily  . dicyclomine  10 mg Oral TID AC  . docusate sodium  100 mg Oral BID  . feeding supplement  1 Container Oral TID BM  . heparin  5,000 Units Subcutaneous Q8H  . latanoprost  1 drop Both Eyes QHS  . midodrine  10 mg Oral TID WC  . multivitamin  1 tablet Oral QHS  . multivitamin with minerals  1 tablet Oral Once per day on Mon Thu  . vitamin C  500 mg Oral BID   acetaminophen **OR** acetaminophen, bisacodyl, HYDROcodone-acetaminophen, ondansetron **OR** ondansetron (ZOFRAN) IV, traZODone  Assessment/ Plan:  [age] y.o. female with end stage renal disease on hemodialysis, anemia, hypertension, polycystic kidney disease, history of pacemaker, history of TIA, vertebral artery aneurysm status post coiling  MWF [facility] [address] Left AVG EDW 54.5kg  1. ESRD 2. AOCKD 3. SHPTH 4. Complication of dialysis access- clotted AVG, clotted cathter  Catheter declotted with tPA today  Possible declot of AVG tomorrow Dialysis today Hgb > 12; hold EPO  LOS: 3 Kolson Chovanec Thedore Mins 2/4/20194:18 [facility] [place], [place] [phone]

## 2017-08-07 NOTE — Progress Notes (Signed)
HD tx start 

## 2017-08-07 NOTE — Progress Notes (Signed)
Post HD assessment  

## 2017-08-07 NOTE — Progress Notes (Signed)
HD tx end. Pt ended tx early r/t being unstable. Pt had several moments of ST, as well as SB. Her heart rate went as high as the 140's and as low as the high 30's. RT was called during tx r/t pt desating several times. A non-rebreather was applied. Dr. Thedore MinsSingh was notified.

## 2017-08-07 NOTE — Progress Notes (Signed)
Patient complaining of stomach pain. Called [facility] and received order for morphine. Patient medicated and now resting comfortably. Will continue to monitor.

## 2017-08-07 NOTE — Consult Note (Signed)
PULMONARY / CRITICAL CARE MEDICINE   Name: Melissa SchroederCaroline J Bradshaw MRN: [id number] DOB: 11/19/1950    ADMISSION DATE:  08/04/2017   CONSULTATION DATE:  08/07/2017  REFERRING MD:  Dr. Caryn BeeMaier  REASON: Acute hypoxia requiring BiPAP  HISTORY OF PRESENT ILLNESS:   This is a 67 year old African-American female with a history of end-stage renal disease on hemodialysis, congestive heart failure, ascites, and adult failure to thrive with recent multiple hospitalizations who presented to the ED with abdominal pain and anorexia.  During hemodialysis today, patient became acutely hypoxic and hypotensive making it difficult for her to complete her hemodialysis session.  The session was aborted and patient was placed on BiPAP and transferred to the ICU.  Upon arrival in the ICU, patient lost IV access and we were unable to establish any further IV access.  After multiple trials patient indicated that she did not want to go through this anymore.  She said her goal was to be comfortable and [place].  Her wishes were discussed with her children and they both agreed that we should proceed with full comfort care with possible discharge to home with hospice care.  Hospice notified and the goal is to have patient home today. She reports mild improvement in abdominal pain with sublingual morphine.  She had episodes of hypoglycemia that we treated   PAST MEDICAL HISTORY :  She  has a past medical history of Anemia, Aneurysm (HCC), Arthritis, Cardiac pacemaker, CHF (congestive heart failure) (HCC), Chronic kidney disease, Constipation, Coronary artery disease, Dialysis patient (HCC), Gastric ulcer, Glaucoma (increased eye pressure), Headache, Hypertension, and Presence of permanent cardiac pacemaker.  PAST SURGICAL HISTORY: She  has a past surgical history that includes Aneurysm coiling; Colonoscopy; pacemaker placement; Cardiac catheterization (N/A, 06/02/2016); Insert / replace / remove pacemaker; AV  fistula placement (Left, 07/20/2016); AV fistula placement (Left, 08/26/2016); DIALYSIS/PERMA CATHETER REMOVAL (N/A, 10/11/2016); IR Radiologist Eval & Mgmt (05/18/2017); DIALYSIS/PERMA CATHETER INSERTION (N/A, 06/22/2017); and DIALYSIS/PERMA CATHETER INSERTION (N/A, 06/23/2017).  Allergies  Allergen Reactions  . Hydralazine Hcl Other (See Comments)    Makes pt jittery, nervous, messes with vision   . Iodine Itching and Rash    Unknown reaction  . Ivp Dye [Iodinated Diagnostic Agents] Rash    No current facility-administered medications on file prior to encounter.    Current Outpatient Medications on File Prior to Encounter  Medication Sig  . aspirin 81 MG tablet Take 81 mg by mouth daily.  . calcium acetate (PHOSLO) 667 MG capsule Take 1,334 mg by mouth 3 (three) times daily with meals.   . dicyclomine (BENTYL) 10 MG capsule Take 1 capsule (10 mg total) by mouth 3 (three) times daily before meals.  . docusate sodium (COLACE) 100 MG capsule Take 1 capsule (100 mg total) by mouth 2 (two) times daily. (Patient taking differently: Take 100 mg by mouth daily as needed. )  . latanoprost (XALATAN) 0.005 % ophthalmic solution Place 1 drop into both eyes at bedtime.  . megestrol (MEGACE) 400 MG/10ML suspension Take 10 mLs (400 mg total) by mouth 2 (two) times daily.  . midodrine (PROAMATINE) 10 MG tablet Take 1 tablet (10 mg total) by mouth 3 (three) times daily with meals.  . multivitamin (RENA-VIT) TABS tablet Take 1 tablet by mouth at bedtime.  . ondansetron (ZOFRAN ODT) 4 MG disintegrating tablet Take 1 tablet (4 mg total) by mouth every 8 (eight) hours as needed for nausea or vomiting.  . lidocaine-prilocaine (EMLA) cream Apply 1 application topically  as directed.   . saccharomyces boulardii (FLORASTOR) 250 MG capsule Take 1 capsule (250 mg total) by mouth 2 (two) times daily.  . vancomycin (VANCOCIN) 250 MG capsule Take 1 capsule (250 mg total) by mouth 4 (four) times daily. (Patient not taking:  Reported on 08/04/2017)    FAMILY HISTORY:  Her indicated that her mother is deceased. She indicated that her father is deceased. She indicated that the status of her sister is unknown.   SOCIAL HISTORY: She  reports that she has been smoking cigarettes.  She has a 7.00 pack-year smoking history. she has never used smokeless tobacco. She reports that she does not drink alcohol or use drugs.  REVIEW OF SYSTEMS:   Constitutional: Reports generalized malaise HENT: Negative for congestion and rhinorrhea.  Eyes: Negative for redness and visual disturbance.  Respiratory: Positive for shortness of breath but negative for wheezing.  Cardiovascular: Negative for chest pain and palpitations.  Gastrointestinal: Positive for abdominal pain, ascites, anorexia but negative for loose stools Genitourinary: Negative for dysuria and urgency.  Endocrine: Denies polyuria, polyphagia and heat intolerance Musculoskeletal: Negative for myalgias and arthralgias.  Skin: Negative for pallor and wound.  Neurological: Negative for dizziness and headaches   SUBJECTIVE:   VITAL SIGNS: BP 100/72 (BP Location: Right Arm)   Pulse 69   Temp (!) 97.3 F (36.3 C) (Oral)   Resp 16   Ht 5\' 6"  (1.676 m)   Wt 49 kg (108 lb 1.6 oz)   SpO2 (!) 81%   BMI 17.45 kg/m   HEMODYNAMICS:    VENTILATOR SETTINGS:    INTAKE / OUTPUT: I/O last 3 completed shifts: In: 260 [P.O.:60; IV Piggyback:200] Out: 0   PHYSICAL EXAMINATION: General: Cachectic Neuro: Alert and oriented x4, cranial nerves intact HEENT: PERRLA, trachea midline Cardiovascular:  RRR, S1-S2, no murmur regurg or gallop, +1 edema, +2 pulses Lungs: Bilateral breath sounds, diminished in the bases, no wheezing Abdomen: Mildly distended, hypoactive bowel sounds, increased liver span on palpation Musculoskeletal: No joint deformities, positive range of motion Skin: Warm and dry, venous stasis discoloration in bilateral lower  extremities  LABS:  BMET Recent Labs  Lab 08/04/17 1822 08/05/17 1704  NA 139 136  K 5.2* 4.8  CL 102 102  CO2 23 22  BUN 29* 30*  CREATININE 10.06* 10.56*  GLUCOSE 104* 104*    Electrolytes Recent Labs  Lab 08/04/17 1822 08/05/17 1704  CALCIUM 8.9 8.4*    CBC Recent Labs  Lab 08/04/17 1822 08/05/17 1704  WBC 7.0 7.8  HGB 13.3 12.0  HCT 41.4 36.3  PLT 90* 120*    Coag's No results for input(s): APTT, INR in the last 168 hours.  Sepsis Markers No results for input(s): LATICACIDVEN, PROCALCITON, O2SATVEN in the last 168 hours.  ABG No results for input(s): PHART, PCO2ART, PO2ART in the last 168 hours.  Liver Enzymes Recent Labs  Lab 08/04/17 1822  AST 37  ALT 57*  ALKPHOS 98  BILITOT 0.8  ALBUMIN 2.2*    Cardiac Enzymes No results for input(s): TROPONINI, PROBNP in the last 168 hours.  Glucose Recent Labs  Lab 08/05/17 0756 08/05/17 0837 08/05/17 0919 08/06/17 0733 08/07/17 0728 08/07/17 2158  GLUCAP 67 65 124* 90 80 44*    Imaging Dg Chest Port 1 View  Result Date: 08/07/2017 CLINICAL DATA:  Acute respiratory failure EXAM: PORTABLE CHEST 1 VIEW COMPARISON:  July 11, 2017 FINDINGS: A right central line terminates near the caval atrial junction. The heart, hila, mediastinum, lungs,  and pleura are otherwise unremarkable. IMPRESSION: No active disease. Electronically Signed   By: Gerome Sam III M.D   On: 08/07/2017 22:31    ANTIBIOTICS: Ciprofloxacin  LINES/TUBES: Right subclavian PermCath  DISCUSSION: 67 year old female with multiple comorbidities presenting with adult failure to thrive, acute hypoxic respiratory failure and hypotension.  Overall prognosis is poor and patient would like to proceed with full comfort care.  After discussion with patient and children, she indicated that she did not want to suffer anymore and that she would prefer to stop all treatments at this point and died peacefully.  She would like to go home  with hospice.  MOLST form completed and a hospice referral initiated.  ASSESSMENT Acute hypoxic respiratory failure End-stage renal disease on hemodialysis Chronic hypotension secondary to poor intake and failure to thrive Adult failure to thrive Diffuse abdominal pain-recent history of C. difficile; currently on ciprofloxacin Thrombocytopenia  PLAN Hospice and palliative care consult Withdrawal of life-sustaining treatment protocol initiated Sublingual morphine and lorazepam for pain and anxiety Patient is a DNR/DNI and for comfort measures   Magdalene S. [facility] ANP-BC Pulmonary and Critical Care Medicine [facility] Pager [phone] or [phone]  NB: This document was prepared using Dragon voice recognition software and may include unintentional dictation errors.    08/07/2017, 10:43 PM

## 2017-08-07 NOTE — Progress Notes (Signed)
TPA infusion completed and catheter flushing well. Report called to floor RN and dialysis RN. Will return patient to room at this time.

## 2017-08-08 ENCOUNTER — Encounter (INDEPENDENT_AMBULATORY_CARE_PROVIDER_SITE_OTHER): Payer: Self-pay

## 2017-08-08 ENCOUNTER — Ambulatory Visit (INDEPENDENT_AMBULATORY_CARE_PROVIDER_SITE_OTHER): Payer: Medicare Other | Admitting: Vascular Surgery

## 2017-08-08 ENCOUNTER — Encounter
Admission: EM | Disposition: A | Discharge status: Hospice | Payer: Self-pay | Source: Home / Self Care | Attending: Internal Medicine

## 2017-08-08 ENCOUNTER — Encounter (INDEPENDENT_AMBULATORY_CARE_PROVIDER_SITE_OTHER): Payer: Medicare Other

## 2017-08-08 DIAGNOSIS — N186 End stage renal disease: Secondary | ICD-10-CM

## 2017-08-08 DIAGNOSIS — Z7189 Other specified counseling: Secondary | ICD-10-CM

## 2017-08-08 DIAGNOSIS — J9601 Acute respiratory failure with hypoxia: Secondary | ICD-10-CM

## 2017-08-08 DIAGNOSIS — R109 Unspecified abdominal pain: Secondary | ICD-10-CM

## 2017-08-08 DIAGNOSIS — Z515 Encounter for palliative care: Secondary | ICD-10-CM

## 2017-08-08 LAB — RENAL FUNCTION PANEL
ALBUMIN: 2 g/dL — AB (ref 3.5–5.0)
ANION GAP: 14 (ref 5–15)
BUN: 29 mg/dL — ABNORMAL HIGH (ref 6–20)
CO2: 22 mmol/L (ref 22–32)
Calcium: 8.1 mg/dL — ABNORMAL LOW (ref 8.9–10.3)
Chloride: 102 mmol/L (ref 101–111)
Creatinine, Ser: 9.5 mg/dL — ABNORMAL HIGH (ref 0.44–1.00)
GFR calc Af Amer: 4 mL/min — ABNORMAL LOW (ref >60)
GFR calc non Af Amer: 4 mL/min — ABNORMAL LOW (ref >60)
GLUCOSE: 111 mg/dL — AB (ref 65–99)
PHOSPHORUS: 4.9 mg/dL — AB (ref 2.5–4.6)
Potassium: 4.9 mmol/L (ref 3.5–5.1)
Sodium: 138 mmol/L (ref 135–145)

## 2017-08-08 LAB — GLUCOSE, CAPILLARY
GLUCOSE-CAPILLARY: 80 mg/dL (ref 65–99)
GLUCOSE-CAPILLARY: 82 mg/dL (ref 65–99)
GLUCOSE-CAPILLARY: 97 mg/dL (ref 65–99)
Glucose-Capillary: 90 mg/dL (ref 65–99)
Glucose-Capillary: 71 mg/dL (ref 65–99)

## 2017-08-08 SURGERY — PERIPHERAL VASCULAR THROMBECTOMY
Anesthesia: Moderate Sedation

## 2017-08-08 MED ORDER — MORPHINE SULFATE (CONCENTRATE) 10 MG/0.5ML PO SOLN
10.0000 mg | ORAL | Status: DC | PRN
  Administered 2017-08-08 – 2017-08-09 (×6): 10 mg via ORAL
  Filled 2017-08-08 (×6): qty 1

## 2017-08-08 MED ORDER — LORAZEPAM 2 MG/ML PO CONC: 1.0000 mg | ORAL | Status: DC | PRN

## 2017-08-08 MED ORDER — LORAZEPAM 2 MG/ML PO CONC: 2.0000 mg | ORAL | Status: DC | PRN

## 2017-08-08 MED ORDER — CIPROFLOXACIN HCL 500 MG PO TABS
500.0000 mg | ORAL_TABLET | Freq: Every day | ORAL | Status: DC
  Administered 2017-08-08 – 2017-08-09 (×2): 500 mg via ORAL
  Filled 2017-08-08 (×2): qty 1

## 2017-08-08 NOTE — Progress Notes (Signed)
Pre hd 

## 2017-08-08 NOTE — Care Management (Addendum)
Informed that patient was to discharge home with hospice services.  Just prior to CM going to speak to patient about agency preference spoke with palliative and at present, patient has verbalized she does not want to go home with hospice.  She wants to continue her dialysis treatments..  [facility] is currently followed by [facility].  Updated [facility] and Dimas ChyleAmanda Morris with [facility].  If does decide to dc home and continued dialysis, may benefit from having outpatient palliative follow in the home also.  Obtained order for physical therapy

## 2017-08-08 NOTE — Progress Notes (Signed)
HR elevated. Patient symptomatic. States "head is swimming." UF off and 150cc saline given. HR back to baseline. Vitals currently stable. Symptoms resolved after saline administration.

## 2017-08-08 NOTE — Progress Notes (Signed)
Post HD assessment unchanged  

## 2017-08-08 NOTE — Progress Notes (Signed)
Np Melissa Bradshaw is okay with nurse using HD cath for D50 administration. Patient has no peripheral IV present at the moment.

## 2017-08-08 NOTE — Progress Notes (Signed)
   08/08/17 1310  Clinical Encounter Type  Visited With Patient  Visit Type Follow-up  Spiritual Encounters  Spiritual Needs Emotional   Chaplain visited with patient, offered emotional support.

## 2017-08-08 NOTE — Progress Notes (Signed)
Patient hypotensive after less than 15 minutes on HD. BFR had not been increased past 28000ml/min. Dr. Thedore MinsSingh notified. Order to initiate sodium modeling and keep bfr at 250. Done. Continue to monitor. Patient is currently asymptomatic.

## 2017-08-08 NOTE — Progress Notes (Signed)
[facility] [place] at [facility]   PATIENT NAME: Melissa PortelaCaroline Bradshaw    MR#:  [id number]  DATE OF BIRTH:  05/01/1951  SUBJECTIVE: Consult to ICU last night because of hypotension, hypoxia.  Patient getting hemodialysis but BP is also on the low side..  CHIEF COMPLAINT:   Chief Complaint  Patient presents with  . Hypotension  . Abdominal Pain    REVIEW OF SYSTEMS:   ROS CONSTITUTIONAL: No fever,  But has fatigue or weakness.  EYES: No blurred or double vision.  EARS, NOSE, AND THROAT: No tinnitus or ear pain.  RESPIRATORY: No cough, shortness of breath, wheezing or hemoptysis.  CARDIOVASCULAR: No chest pain, orthopnea, edema.  GASTROINTESTINAL: No nausea, vomiting, diarrhea. chronic abdominal pain but got worse recently.  Denies any fever.  GENITOURINARY: No dysuria, hematuria.  ENDOCRINE: No polyuria, nocturia,  HEMATOLOGY: No anemia, easy bruising or bleeding SKIN: No rash or lesion. MUSCULOSKELETAL: No joint pain or arthritis.   NEUROLOGIC: No tingling, numbness, weakness.  PSYCHIATRY: No anxiety or depression.   DRUG ALLERGIES:   Allergies  Allergen Reactions  . Hydralazine Hcl Other (See Comments)    Makes pt jittery, nervous, messes with vision   . Iodine Itching and Rash    Unknown reaction  . Ivp Dye [Iodinated Diagnostic Agents] Rash    VITALS:  Blood pressure (!) 89/62, pulse (!) 112, temperature 97.8 F (36.6 C), temperature source Oral, resp. rate 18, height 5\' 5"  (1.651 m), weight 50.5 kg (111 lb 5.3 oz), SpO2 92 %.  PHYSICAL EXAMINATION:  GENERAL:  67 y.o.-year-old patient lying in the bed with no acute distress.  Appears chronically ill, cachectic. EYES: Pupils equal, round, reactive to light . No scleral icterus. Extraocular muscles intact.  HEENT: Head atraumatic, normocephalic. Oropharynx and nasopharynx clear.  NECK:  Supple, no jugular venous distention. No thyroid enlargement, no tenderness.  LUNGS: Normal breath sounds  bilaterally, no wheezing, rales,rhonchi or crepitation. No use of accessory muscles of respiration.  CARDIOVASCULAR: S1, S2 normal. No murmurs, rubs, or gallops.  ABDOMEN: Soft, nontender, distended with ascites. Bowel sounds present. No organomegaly or mass.  Nontender on exam.  Does have umbilical hernia. EXTREMITIES: Trace pedal edema, no cyanosis, or clubbing.  NEUROLOGIC: Cranial nerves II through XII are intact. Muscle strength 5/5 in all extremities. Sensation intact. Gait not checked.  PSYCHIATRIC: The patient is alert and oriented x 3.  SKIN: No obvious rash, lesion, or ulcer.    LABORATORY PANEL:   CBC Recent Labs  Lab 08/05/17 1704  WBC 7.8  HGB 12.0  HCT 36.3  PLT 120*   ------------------------------------------------------------------------------------------------------------------  Chemistries  Recent Labs  Lab 08/04/17 1822 08/05/17 1704  NA 139 136  K 5.2* 4.8  CL 102 102  CO2 23 22  GLUCOSE 104* 104*  BUN 29* 30*  CREATININE 10.06* 10.56*  CALCIUM 8.9 8.4*  AST 37  --   ALT 57*  --   ALKPHOS 98  --   BILITOT 0.8  --    ------------------------------------------------------------------------------------------------------------------  Cardiac Enzymes No results for input(s): TROPONINI in the last 168 hours. ------------------------------------------------------------------------------------------------------------------  RADIOLOGY:  Dg Chest Port 1 View  Result Date: 08/07/2017 CLINICAL DATA:  Acute respiratory failure EXAM: PORTABLE CHEST 1 VIEW COMPARISON:  July 11, 2017 FINDINGS: A right central line terminates near the caval atrial junction. The heart, hila, mediastinum, lungs, and pleura are otherwise unremarkable. IMPRESSION: No active disease. Electronically Signed   By: Gerome Samavid  Williams III M.D   On: 08/07/2017  22:31    EKG:   Orders placed or performed during the hospital encounter of 08/04/17  . ED EKG  . ED EKG  . EKG 12-Lead  . EKG  12-Lead  . EKG 12-Lead  . EKG 12-Lead    ASSESSMENT AND PLAN:   #1 diffuse abdominal pain due to large volume ascites, CT abdomen did not show acute changes.  Recently finished treatment for C. difficile colitis.  Patient is on pain medicine, afebrile, WBC normal.  Startedempiric antibiotics for abdominal pain, ascites with possible SBP.  On empiric antibiotics for that.  Patient has chronic abdominal pain and also requesting morphine.  Prognosis poor because of her multiple medical problems and also cachexia and not eating well with severe malnutrition.  Hypotension is a DNR but patient refusing to go off the dialysis and still requesting hemodialysis..  She had palliative care consult and following the patient recommend hospice home. 2.  Hypotension: Patient has chronic hypotension, patient is on Midodrin at home restarted that. 3.  ESRD: On hemodialysis Monday, Wednesday, Friday.  Last hemodialysis was on Monday,   Status post thrombolysis of permacath, no w  working well.  During ICU, getting hemodialysis.  Hypotensive.  4.  Thrombocytopenia: Chronic . Chronic severe malnutrition: Seen by registered dietitian during admission in January, patient needs to be on Magic cup 3 times daily with meals, vitamin C, renal white, multivitamins.   All the records are reviewed and case discussed with Care Management/Social Workerr. Management plans discussed with the patient, family and they are in agreement.  CODE STATUS: DNR  TOTAL TIME TAKING CARE OF THIS PATIENT: 35 minutes.   POSSIBLE D/C IN 1-2DAYS, DEPENDING ON CLINICAL CONDITION.   Katha Hamming M.D on 08/08/2017 at 2:46 PM  Between 7am to 6pm - Pager - [phone]  After 6pm go to [email or web address] - password [email or web address] [facility]  [facility]  Office  [phone]  CC: Primary care physician; Corky Downs, MD   Note: This dictation was prepared with Dragon dictation along with smaller phrase technology. Any  transcriptional errors that result from this process are unintentional.

## 2017-08-08 NOTE — Progress Notes (Signed)
HD stopped with 10 minutes remaining. BP 60s, hr 120s and patient mildly symptomatic. BP back to baseline after rinseback. Unable to UF with HD due to hypotension. BFR 200 for entire treatment. DFR 600. Sodium modeling used and patient dialyzed with bed tilted back as could not tolerate head down due to shortness of breath. Oxygen also given during treatment at 2L . Dr. Thedore MinsSingh updated. Report given to primary RN. Patient stable post HD

## 2017-08-08 NOTE — Progress Notes (Signed)
[facility], [place] 08/08/17  Subjective:   Patient had rough night She became hypotensive and tachycardic with HD HR varied from 40's to 140's. Rapid response was called and patient was transferred to the ICU for further evaluation According to ICU notes from last night, patient wished to go home with hospice but this morning she expressed to multiple team members as well as palliative care team that she wants to continue with dialysis.  Objective:  Vital signs in last 24 hours:  Temp:  [97.3 F (36.3 C)-98.3 F (36.8 C)] 97.7 F (36.5 C) (02/05 0800) Pulse Rate:  [36-131] 112 (02/05 0700) Resp:  [11-25] 18 (02/05 0900) BP: (65-121)/(49-97) 93/74 (02/05 0100) SpO2:  [75 %-100 %] 80 % (02/05 0700) Weight:  [50.3 kg (110 lb 14.3 oz)] 50.3 kg (110 lb 14.3 oz) (02/04 2300)  Weight change: 1.266 kg (2 lb 12.7 oz) Filed Weights   08/07/17 0656 08/07/17 2300  Weight: 49 kg (108 lb 1.6 oz) 50.3 kg (110 lb 14.3 oz)    Intake/Output:    Intake/Output Summary (Last 24 hours) at 08/08/2017 1041 Last data filed at 08/07/2017 2057 Gross per 24 hour  Intake -  Output -650 ml  Net 650 ml     Physical Exam: General: Laying in bed, thin cachectic  HEENT Moist oral mucus membranes  Neck supple  Pulm/lungs Clear b/l  CVS/Heart No rub, regular  Abdomen:  Soft, Palpable liver  Extremities: 1+ edema  Neurologic: Alert, oreinted  Skin: No acute rashes  Access: Rt IJ PC, left thrombosed AVG       Basic Metabolic Panel:  Recent Labs  Lab 08/04/17 1822 08/05/17 1704  NA 139 136  K 5.2* 4.8  CL 102 102  CO2 23 22  GLUCOSE 104* 104*  BUN 29* 30*  CREATININE 10.06* 10.56*  CALCIUM 8.9 8.4*     CBC: Recent Labs  Lab 08/04/17 1822 08/05/17 1704  WBC 7.0 7.8  HGB 13.3 12.0  HCT 41.4 36.3  MCV 92.0 90.3  PLT 90* 120*      Lab Results  Component Value Date   HEPBSAG Negative 07/11/2017   HEPBSAB Non Reactive 06/02/2016       Microbiology:  Recent Results (from the past 240 hour(s))  MRSA PCR Screening     Status: None   Collection Time: 08/07/17 10:07 PM  Result Value Ref Range Status   MRSA by PCR NEGATIVE NEGATIVE Final    Comment:        The GeneXpert MRSA Assay (FDA approved for NASAL specimens only), is one component of a comprehensive MRSA colonization surveillance program. It is not intended to diagnose MRSA infection nor to guide or monitor treatment for MRSA infections. Performed at [facility], [address] [place], [place] [address]     Coagulation Studies: No results for input(s): LABPROT, INR in the last 72 hours.  Urinalysis: No results for input(s): COLORURINE, LABSPEC, PHURINE, GLUCOSEU, HGBUR, BILIRUBINUR, KETONESUR, PROTEINUR, UROBILINOGEN, NITRITE, LEUKOCYTESUR in the last 72 hours.  Invalid input(s): APPERANCEUR    Imaging: Dg Chest Port 1 View  Result Date: 08/07/2017 CLINICAL DATA:  Acute respiratory failure EXAM: PORTABLE CHEST 1 VIEW COMPARISON:  July 11, 2017 FINDINGS: A right central line terminates near the caval atrial junction. The heart, hila, mediastinum, lungs, and pleura are otherwise unremarkable. IMPRESSION: No active disease. Electronically Signed   By: Gerome Sam III M.D   On: 08/07/2017 22:31     Medications:    .  aspirin EC  81 mg Oral Daily  . ciprofloxacin  500 mg Oral Daily  . dicyclomine  10 mg Oral TID AC  . docusate sodium  100 mg Oral BID  . feeding supplement  1 Container Oral TID BM  . heparin  5,000 Units Subcutaneous Q8H  . latanoprost  1 drop Both Eyes QHS  . midodrine  10 mg Oral TID WC  . multivitamin  1 tablet Oral QHS  . multivitamin with minerals  1 tablet Oral Once per day on Mon Thu  . vitamin C  500 mg Oral BID   acetaminophen **OR** acetaminophen, bisacodyl, LORazepam, morphine CONCENTRATE, ondansetron **OR** ondansetron (ZOFRAN) IV, traZODone  Assessment/ Plan:  67 y.o. female with end stage  renal disease on hemodialysis, anemia, hypertension, polycystic kidney disease, history of pacemaker, history of TIA, vertebral artery aneurysm status post coiling  MWF [facility] [address] Left AVG EDW 54.5kg  1. ESRD 2. AOCKD 3. SHPTH 4. Complication of dialysis access- clotted AVG, clotted cathter  Catheter declotted with tPA this admission Patient did not do well with dialysis yesterday.  She had significant arrhythmia and hypotension Plan to attempt hemodialysis again today with slow blood flow, minimum volume removal, low temperature If she does not tolerate dialysis today, then we will have to revisit the issue of hospice with patient and family       LOS: 4 Melissa Bradshaw 2/5/201910:41 AM  [facility] [place], [place] [phone]

## 2017-08-08 NOTE — Progress Notes (Signed)
PT Cancellation Note  Patient Details Name: Melissa SchroederCaroline J Bradshaw MRN: [id number] DOB: 07/04/1951   Cancelled Treatment:    Reason Eval/Treat Not Completed: Patient at procedure or test/unavailable; Pt receiving in-room HD that is scheduled to be completed after 5PM.  Will attempt to see pt at a future date as medically appropriate.     Ovidio Hanger. Scott Sydnei Ohaver PT, DPT 08/08/17, 3:05 PM

## 2017-08-08 NOTE — Consult Note (Signed)
Consultation Note Date: 08/08/2017   Patient Name: Melissa Bradshaw  DOB: 1950/09/28  MRN: [id number]  Age / Sex: 67 y.o., female  PCP: Corky Downs, MD Referring Physician: Katha Hamming, MD  Reason for Consultation: Establishing goals of care  HPI/Patient Profile: Melissa Bradshaw  is a 67 y.o. female with a known history of end-stage renal disease on hemodialysis, coronary artery disease, CHF, ascites.  Patient was recently hospitalized just in the past month as she missed dialysis, and had been having weakness and nausea.    Clinical Assessment and Goals of Care:  [name] is resting in bed. She is frustrated with still currently being admitted to the hospital. She states she would like to go home and is tired of coming to the hospital, and feels she would fell better and recover better at home. She acknowledges she has been suffering and is tired of suffering and would like to stop suffering and feel better.   We discussed GOC. She states she has weakness and feels badly after her dialysis. She states she wants to continue dialysis, but wants her time cut back; she believes this would stop her symptoms. She states that although she does not like being in the hospital, she wants to come to the hospital if needed as long as she can be discharged as quickly as she is safe to. We discussed her QOL and she states "the Shaune Pollack is in charge, we're not going to talk about that anymore " and began discussing stretching her legs.   Spoke with daughter via phone, she states yesterday her mother just wanted to be comfortable. She states she can come tomorrow for a family meeting.    Spoke with Dr. Thedore Mins, plans for attempt of dialysis today and Dr. Thedore Mins and I will meet with Ms. Mcphearson and her daughter tomorrow for further conversation.     SUMMARY OF RECOMMENDATIONS   Dialysis today. Family meeting with nephrology  tomorrow.   She does not want comfort care or hospice at this time. She confirms DNR/DNI.    Code Status/Advance Care Planning:  DNR    Symptom Management:   Per primary team.   Palliative Prophylaxis:   Oral Care   Prognosis:   Poor. Recent hospitalization.   Discharge Planning: To Be Determined      Primary Diagnoses: Present on Admission: . Abdominal pain   I have reviewed the medical record, interviewed the patient and family, and examined the patient. The following aspects are pertinent.  Past Medical History:  Diagnosis Date  . Anemia   . Aneurysm (HCC)    Cerebral, [place], [place]  . Arthritis   . Cardiac pacemaker   . CHF (congestive heart failure) (HCC)   . Chronic kidney disease    ESRD  . Constipation   . Coronary artery disease   . Dialysis patient (HCC)    Tues, Thurs, Sat  . Gastric ulcer   . Glaucoma (increased eye pressure)   . Headache   . Hypertension   .  Presence of permanent cardiac pacemaker    Social History   Socioeconomic History  . Marital status: Single    Spouse name: None  . Number of children: None  . Years of education: None  . Highest education level: None  Social Needs  . Financial resource strain: None  . Food insecurity - worry: None  . Food insecurity - inability: None  . Transportation needs - medical: None  . Transportation needs - non-medical: None  Occupational History  . None  Tobacco Use  . Smoking status: Current Every Day Smoker    Packs/day: 0.25    Years: 28.00    Pack years: 7.00    Types: Cigarettes  . Smokeless tobacco: Never Used  . Tobacco comment: pt lives with people who smoke in house  Substance and Sexual Activity  . Alcohol use: No  . Drug use: No  . Sexual activity: None  Other Topics Concern  . None  Social History Narrative  . None   Family History  Problem Relation Age of Onset  . Hypertension Mother   . Diabetes Father   . Cancer Sister   . Stroke Sister     . Hypertension Sister    Scheduled Meds: . aspirin EC  81 mg Oral Daily  . ciprofloxacin  500 mg Oral Daily  . dicyclomine  10 mg Oral TID AC  . docusate sodium  100 mg Oral BID  . feeding supplement  1 Container Oral TID BM  . heparin  5,000 Units Subcutaneous Q8H  . latanoprost  1 drop Both Eyes QHS  . midodrine  10 mg Oral TID WC  . multivitamin  1 tablet Oral QHS  . multivitamin with minerals  1 tablet Oral Once per day on Mon Thu  . vitamin C  500 mg Oral BID   Continuous Infusions: PRN Meds:.acetaminophen **OR** acetaminophen, bisacodyl, LORazepam, morphine CONCENTRATE, ondansetron **OR** ondansetron (ZOFRAN) IV, traZODone Medications Prior to Admission:  Prior to Admission medications   Medication Sig Start Date End Date Taking? Authorizing Provider  aspirin 81 MG tablet Take 81 mg by mouth daily.   Yes [provider]  calcium acetate (PHOSLO) 667 MG capsule Take 1,334 mg by mouth 3 (three) times daily with meals.    Yes [provider]  dicyclomine (BENTYL) 10 MG capsule Take 1 capsule (10 mg total) by mouth 3 (three) times daily before meals. 07/18/17  Yes Salary, Evelena Asa, MD  docusate sodium (COLACE) 100 MG capsule Take 1 capsule (100 mg total) by mouth 2 (two) times daily. Patient taking differently: Take 100 mg by mouth daily as needed.  06/03/17  Yes Enid Baas, MD  latanoprost (XALATAN) 0.005 % ophthalmic solution Place 1 drop into both eyes at bedtime.   Yes [provider]  megestrol (MEGACE) 400 MG/10ML suspension Take 10 mLs (400 mg total) by mouth 2 (two) times daily. 07/18/17  Yes Salary, Evelena Asa, MD  midodrine (PROAMATINE) 10 MG tablet Take 1 tablet (10 mg total) by mouth 3 (three) times daily with meals. 07/18/17  Yes Salary, Evelena Asa, MD  multivitamin (RENA-VIT) TABS tablet Take 1 tablet by mouth at bedtime. 07/18/17  Yes Salary, Evelena Asa, MD  ondansetron (ZOFRAN ODT) 4 MG disintegrating tablet Take 1 tablet (4 mg total) by  mouth every 8 (eight) hours as needed for nausea or vomiting. 06/03/17  Yes Enid Baas, MD  lidocaine-prilocaine (EMLA) cream Apply 1 application topically as directed.  11/01/16   [provider]  saccharomyces boulardii (FLORASTOR) 250 MG capsule Take 1 capsule (250 mg total) by mouth 2 (two) times daily. 07/18/17   Salary, Evelena AsaMontell D, MD  vancomycin (VANCOCIN) 250 MG capsule Take 1 capsule (250 mg total) by mouth 4 (four) times daily. Patient not taking: Reported on 08/04/2017 07/18/17   Salary, Evelena AsaMontell D, MD   Allergies  Allergen Reactions  . Hydralazine Hcl Other (See Comments)    Makes pt jittery, nervous, messes with vision   . Iodine Itching and Rash    Unknown reaction  . Ivp Dye [Iodinated Diagnostic Agents] Rash   Review of Systems  Constitutional: Positive for fatigue.  Neurological: Positive for weakness.    Physical Exam  Constitutional: No distress.  Pulmonary/Chest: Effort normal.  Neurological: She is alert.  Skin: Skin is warm and dry.    Vital Signs: BP 93/74   Pulse (!) 112   Temp 97.7 F (36.5 C) (Oral)   Resp 18   Ht 5\' 5"  (1.651 m)   Wt 50.3 kg (110 lb 14.3 oz)   SpO2 (!) 80%   BMI 18.45 kg/m  Pain Assessment: No/denies pain POSS *See Group Information*: 1-Acceptable,Awake and alert Pain Score: Asleep   SpO2: SpO2: (!) 80 % O2 Device:SpO2: (!) 80 % O2 Flow Rate: .O2 Flow Rate (L/min): 6 L/min(RT at bedside)  IO: Intake/output summary:   Intake/Output Summary (Last 24 hours) at 08/08/2017 0953 Last data filed at 08/07/2017 2057 Gross per 24 hour  Intake -  Output -650 ml  Net 650 ml    LBM: Last BM Date: 08/06/17 Baseline Weight: Weight: 47.6 kg (105 lb) Most recent weight: Weight: 50.3 kg (110 lb 14.3 oz)     Palliative Assessment/Data:      Time In: 9:20 Time Out: 10:30 Time Total: 70 min Greater than 50%  of this time was spent counseling and coordinating care related to the above assessment and plan.  Signed  by: Morton Stallrystal Jarreau Callanan, NP   Please contact Palliative Medicine Team phone at [phone] for questions and concerns.  For individual provider: See Loretha StaplerAmion

## 2017-08-08 NOTE — Progress Notes (Signed)
Order Req was received at 24:05.  Nurse followed up with a phone call at 24:25 for prayer and spiritual support. Chaplain arrived at patient's room at 24:30 and discussed the situation with the nurse and family. Chaplain provided prayer for both the family and nurses.

## 2017-08-08 NOTE — Clinical Social Work Note (Signed)
CSW consulted to arrange home with hospice. RN CM will arrange home with hospice and has been notified. York SpanielMonica Leanza Shepperson MSW,LCSW [phone]

## 2017-08-08 NOTE — Progress Notes (Signed)
Pre hd assessment. Patient resting quietly without complaints in ICU

## 2017-08-08 NOTE — Progress Notes (Signed)
HD initiated via R Chest HD cath without issue. Patient currently without complaints. BP stable. Low bfr/temp treatment today as ordered. Continue to monitor closely

## 2017-08-08 NOTE — Progress Notes (Signed)
   08/08/17 0950  Clinical Encounter Type  Visited With Patient  Visit Type Follow-up   Chaplain checked in with patient; patient indicated later in the day would be better to visit.

## 2017-08-09 ENCOUNTER — Encounter (INDEPENDENT_AMBULATORY_CARE_PROVIDER_SITE_OTHER): Payer: Self-pay | Admitting: Vascular Surgery

## 2017-08-09 DIAGNOSIS — I959 Hypotension, unspecified: Secondary | ICD-10-CM

## 2017-08-09 LAB — GLUCOSE, CAPILLARY
GLUCOSE-CAPILLARY: 100 mg/dL — AB (ref 65–99)
GLUCOSE-CAPILLARY: 103 mg/dL — AB (ref 65–99)
Glucose-Capillary: 112 mg/dL — ABNORMAL HIGH (ref 65–99)

## 2017-08-09 MED ORDER — MORPHINE SULFATE (CONCENTRATE) 10 MG/0.5ML PO SOLN: 10.0000 mg | ORAL | 0 refills | Status: AC | PRN

## 2017-08-09 MED ORDER — LORAZEPAM 2 MG/ML PO CONC: 1.0000 mg | ORAL | 0 refills | Status: AC | PRN

## 2017-08-09 NOTE — Progress Notes (Signed)
[facility], [place] 08/09/17  Subjective:   Patient was again stable with dialysis last night.  Hemodialysis.  10 minutes daily.  Blood pressure was down in the 60s and heart rate in the 120s.  Blood pressure returned to baseline after patient was released back.  No ultrafiltration was obtained.  Blood flow rate was only 200 for the entire treatment.  Sodium modeling was used and piriformis bed had to be tilted back.  Temperature was reduced to 35 degrees Patient is already on Midrin  Had discussion with family members, patient palliative care team in the room  Objective:  Vital signs in last 24 hours:  Temp:  [97.6 F (36.4 C)-98.1 F (36.7 C)] 98.1 F (36.7 C) (02/06 1204) Pulse Rate:  [51-131] 118 (02/06 0759) Resp:  [10-25] 16 (02/06 1204) BP: (68-111)/(43-80) 88/53 (02/06 1204) SpO2:  [65 %-98 %] 82 % (02/05 2037) Weight:  [50.5 kg (111 lb 5.3 oz)-53.4 kg (117 lb 11.6 oz)] 53.4 kg (117 lb 11.6 oz) (02/05 2010)  Weight change: 0.2 kg (7.1 oz) Filed Weights   08/08/17 1354 08/08/17 1653 08/08/17 2010  Weight: 50.5 kg (111 lb 5.3 oz) 50.5 kg (111 lb 5.3 oz) 53.4 kg (117 lb 11.6 oz)    Intake/Output:    Intake/Output Summary (Last 24 hours) at 08/09/2017 1245 Last data filed at 08/09/2017 1000 Gross per 24 hour  Intake 230 ml  Output -607 ml  Net 837 ml     Physical Exam: General: Laying in bed, thin cachectic  HEENT Moist oral mucus membranes  Neck supple  Pulm/lungs Clear b/l  CVS/Heart No rub, regular  Abdomen:  Soft, Palpable liver  Extremities: 1+ edema  Neurologic: Alert, oreinted  Skin: No acute rashes  Access: Rt IJ PC, left thrombosed AVG       Basic Metabolic Panel:  Recent Labs  Lab 08/04/17 1822 08/05/17 1704 08/08/17 1404  NA 139 136 138  K 5.2* 4.8 4.9  CL 102 102 102  CO2 23 22 22   GLUCOSE 104* 104* 111*  BUN 29* 30* 29*  CREATININE 10.06* 10.56* 9.50*  CALCIUM 8.9 8.4* 8.1*  PHOS  --   --  4.9*      CBC: Recent Labs  Lab 08/04/17 1822 08/05/17 1704  WBC 7.0 7.8  HGB 13.3 12.0  HCT 41.4 36.3  MCV 92.0 90.3  PLT 90* 120*      Lab Results  Component Value Date   HEPBSAG Negative 07/11/2017   HEPBSAB Non Reactive 06/02/2016      Microbiology:  Recent Results (from the past 240 hour(s))  MRSA PCR Screening     Status: None   Collection Time: 08/07/17 10:07 PM  Result Value Ref Range Status   MRSA by PCR NEGATIVE NEGATIVE Final    Comment:        The GeneXpert MRSA Assay (FDA approved for NASAL specimens only), is one component of a comprehensive MRSA colonization surveillance program. It is not intended to diagnose MRSA infection nor to guide or monitor treatment for MRSA infections. Performed at [facility], [address] [place], [place] [address]     Coagulation Studies: No results for input(s): LABPROT, INR in the last 72 hours.  Urinalysis: No results for input(s): COLORURINE, LABSPEC, PHURINE, GLUCOSEU, HGBUR, BILIRUBINUR, KETONESUR, PROTEINUR, UROBILINOGEN, NITRITE, LEUKOCYTESUR in the last 72 hours.  Invalid input(s): APPERANCEUR    Imaging: Dg Chest Port 1 View  Result Date: 08/07/2017 CLINICAL DATA:  Acute respiratory failure  EXAM: PORTABLE CHEST 1 VIEW COMPARISON:  July 11, 2017 FINDINGS: A right central line terminates near the caval atrial junction. The heart, hila, mediastinum, lungs, and pleura are otherwise unremarkable. IMPRESSION: No active disease. Electronically Signed   By: Gerome Samavid  Williams III M.D   On: 08/07/2017 22:31     Medications:    . aspirin EC  81 mg Oral Daily  . ciprofloxacin  500 mg Oral Daily  . dicyclomine  10 mg Oral TID AC  . docusate sodium  100 mg Oral BID  . feeding supplement  1 Container Oral TID BM  . latanoprost  1 drop Both Eyes QHS  . midodrine  10 mg Oral TID WC   acetaminophen **OR** acetaminophen, bisacodyl, LORazepam, morphine CONCENTRATE, ondansetron **OR** ondansetron  (ZOFRAN) IV, traZODone  Assessment/ Plan:  67 y.o. female with end stage renal disease on hemodialysis, anemia, hypertension, polycystic kidney disease, history of pacemaker, history of TIA, vertebral artery aneurysm status post coiling  MWF [facility] [address] Left AVG EDW 54.5kg  1. ESRD 2. AOCKD 3. SHPTH 4. Complication of dialysis access- clotted AVG, clotted cathter  Catheter declotted with tPA this admission Patient did not do well with dialysis  again last evening .  She had significant  Hypotension as described above Patient is not stable for outpatient dialysis.  She will not be able to tolerate current clinical condition.  With her poor nutrition and low blood pressure and arrhythmias, continuing dialysis will likely lead to her demise during treatment.  Recommend hospice.  Palliative care is discussing this with patient and family.  Patient revealed today that her father had polycystic kidneys.  Recommended to the patient's daughters to consider screening for polycystic kidneys      LOS: 5 Melissa Bradshaw Thedore MinsSingh 2/6/201912:45 [facility]  [facility] [place], [place] [phone]

## 2017-08-09 NOTE — Care Management (Signed)
Patient to discharge home today with Hospice services.  Patient lives at home with son.  Son, and 2 daughters are at bedside.  Patient has decided to stop HD, and discharge home with hospice services.  Provided Hospice agency preference.  [facility] selected.  Karen with [facility] notified of referral .  O2 to be delivered to room, as family wishes to transport by care.  Dimas ChyleAmanda Morris dialysis liaison notified of discharge.  RNCM signing off.

## 2017-08-09 NOTE — Progress Notes (Signed)
Discharge teaching given to patient and daughters. Patient and daughters verbalized understanding and had no questions.  Patient will be transported home by family. All patient belongings gathered prior to leaving. Patient oxygen tank had been brought to room from home by daughter prior to discharge. Patient will be on hospice when returning home. Pain meds administered prior to discharge.

## 2017-08-09 NOTE — Progress Notes (Signed)
New referral for [facility] services at home received from [place] following a Palliative Medicine consult. Patient is a 67 year old woman with ESRD on hemodialysis, coronary artery disease, CHF and ascites admitted to [facility] on 2/1 with abdominal pain. CT did not reveal any acute process, did show ascites. Last dialysis was attempted on 2/5, patient did not tolerate with low BP and elevated heart rate. Palliative medicine was consulted for goals of care and have met with patient and her family. They have chosen to stop dialysis and focus on comfort with plan to return home as soon as possible.  Writer met in the room with patient and her daughter's Karna Christmas and Cherlyn Cushing and son Legrand Como to initiate education regarding hospice services, philosophy and team approach to care with understanding voiced.  Neither patient nor Legrand Como participated in the conversation. Patient is currently on oxygen at 2 liters and does not have oxygen in the home. Family wishes for oxygen to be delivered and plans to take patient home via car. Portable oxygen and concentrator to be ordered by Probation officer. Portable tank to be delivered to the hospital room for transport home. CMRN Stephanie updated. Signed DNR in place in patient chart to accompany her at discharge. Patient will also require a prescription for liquid morphine. CM updated. Patient information faxed to referral. Oxygen requested to be delivered as soon as possible. Flo Shanks RN, BSN, [facility], hospital liaison [phone]

## 2017-08-09 NOTE — Progress Notes (Addendum)
Daily Progress Note   Patient Name: Melissa Bradshaw       Date: 08/09/2017 DOB: [birth year]  Age: 67 y.o. MRN#: [id number] Attending Physician: Katha Hamming, MD Primary Care Physician: Corky Downs, MD Admit Date: 08/04/2017  Reason for Consultation/Follow-up: Establishing goals of care  Subjective: Ms. First is resting in bed. Family at bedside. Dr. Thedore Mins in for portion of family meeting.   We discussed diagnosis, prognosis, GOC, EOL wishes disposition and options.  A detailed discussion was had today regarding advanced directives.  Concepts specific to code status, artifical feeding and hydration,  IV antibiotics and rehospitalization was had.  The difference between an aggressive medical intervention path  and a hospice comfort care path for this patient at this time was discussed.  Values and goals of care important to patient and family were attempted to be elicited. Natural trajectory and expectations at EOL were discussed.   Patient has not been tolerating dialysis well for a while and nephrology has adjusted parameters to try to make treatments productive for her and to help her feel better.  Per conversation, treatment yesterday was stopped early, no fluid was removed, and patient was symptomatic. Patient is not able to eat or drink very much as she has not been hungry, and has nausea and vomiting. She has polycystic kidneys and liver. Patient nor family wants to stop treatments but understands her situation. Support offered.  She would like to stop dialysis and proceed home with hospice.   MOST form filed out in ICU.   Length of Stay: 5  Current Medications: Scheduled Meds:  . aspirin EC  81 mg Oral Daily  . ciprofloxacin  500 mg Oral Daily  . dicyclomine  10 mg Oral TID AC    . docusate sodium  100 mg Oral BID  . feeding supplement  1 Container Oral TID BM  . latanoprost  1 drop Both Eyes QHS  . midodrine  10 mg Oral TID WC    Continuous Infusions:   PRN Meds: acetaminophen **OR** acetaminophen, bisacodyl, LORazepam, morphine CONCENTRATE, ondansetron **OR** ondansetron (ZOFRAN) IV, traZODone  Physical Exam  Constitutional:  Thin and frail   Pulmonary/Chest: Effort normal.  Neurological: She is alert.  Oriented  Skin: Skin is warm and dry.            Vital  Signs: BP (!) 80/57   Pulse (!) 118   Temp 97.8 F (36.6 C)   Resp 18   Ht 5\' 5"  (1.651 m)   Wt 53.4 kg (117 lb 11.6 oz)   SpO2 (!) 82%   BMI 19.59 kg/m  SpO2: SpO2: (!) 82 % O2 Device: O2 Device: Nasal Cannula O2 Flow Rate: O2 Flow Rate (L/min): 2 L/min  Intake/output summary:   Intake/Output Summary (Last 24 hours) at 08/09/2017 1141 Last data filed at 08/09/2017 1000 Gross per 24 hour  Intake 230 ml  Output -607 ml  Net 837 ml   LBM: Last BM Date: 08/07/17 Baseline Weight: Weight: 47.6 kg (105 lb) Most recent weight: Weight: 53.4 kg (117 lb 11.6 oz)       Palliative Assessment/Data: 20%      Patient Active Problem List   Diagnosis Date Noted  . Abdominal pain 08/04/2017  . ESRD (end stage renal disease) on dialysis (HCC) 06/21/2017  . Abdominal pain, epigastric 06/08/2017  . Hyperkalemia 06/01/2017  . Ascites 03/29/2017  . Polycystic liver disease 09/30/2016  . Pre-transplant evaluation for kidney transplant 09/30/2016  . Complication of vascular access for dialysis 08/15/2016  . Anemia 08/08/2016  . Protein-calorie malnutrition, severe 06/01/2016  . Fluid overload 05/31/2016  . Anemia, chronic renal failure 01/31/2016  . End stage renal disease (HCC) 01/01/2015  . Iron deficiency anemia 11/01/2014  . Essential (primary) hypertension 11/08/2012  . Artificial cardiac pacemaker 11/08/2012  . Congenital cystic disease of kidney 11/08/2012  . Temporary cerebral  vascular dysfunction 11/08/2012  . Tobacco abuse, in remission 11/08/2012  . 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 Abnormal presence of protein in urine 09/21/2011  . ADPKD (autosomal dominant polycystic kidney disease) 06/07/2011    Palliative Care Assessment & Plan   Patient Profile: Melissa Bradshaw a67 y.o.femalewith a known history of end-stage renal disease on hemodialysis,coronary artery disease,CHF,ascites.Patient was recently hospitalized just in the past month as she missed dialysis, and had been having weakness and nausea.     Assessment/ Recommendations/Plan:  Patient is not tolerating dialysis well, stopping dialysis. Malnourished and has not been hungry.   Code Status:    Code Status Orders  (From admission, onward)        Start     Ordered   08/05/17 0010  Do not attempt resuscitation (DNR)  Continuous    Question Answer Comment  In the event of cardiac or respiratory ARREST Do not call a "code blue"   In the event of cardiac or respiratory ARREST Do not perform Intubation, CPR, defibrillation or ACLS   In the event of cardiac or respiratory ARREST Use medication by any route, position, wound care, and other measures to relive pain and suffering. May use oxygen, suction and manual treatment of airway obstruction as needed for comfort.   Comments MOST form in chart      08/05/17 0009    Code Status History    Date Active Date Inactive Code Status Order ID Comments User Context   07/12/2017 12:05 07/18/2017 15:45 DNR [id number]  Morton Stall, NP Inpatient   07/11/2017 19:56 07/12/2017 12:05 Full Code [id number]  Ramonita Lab, MD Inpatient   06/21/2017 22:31 06/24/2017 01:52 Full Code [id number]  Bertrum Sol, MD Inpatient   06/01/2017 13:07 06/03/2017 20:19 Full Code [id number]  Katha Hamming, MD Inpatient   05/31/2016 21:01 06/07/2016 18:40 Full Code [id number]  Altamese Dilling, MD Inpatient       Prognosis:   < 2 weeks  Family would like to proceed home with hospice.   Discharge Planning:  Home with Hospice  Care plan was discussed with CM.  Thank you for allowing the Palliative Medicine Team to assist in the care of this patient.   Time In: 10:30 Time Out: 11:55 Total Time 85 min Prolonged Time Billed  Yes      Greater than 50%  of this time was spent counseling and coordinating care related to the above assessment and plan.  Morton Stallrystal Paolo Okane, NP  Please contact Palliative Medicine Team phone at [phone] for questions and concerns.

## 2017-08-09 NOTE — Progress Notes (Signed)
   08/09/17 1200  Clinical Encounter Type  Visited With Patient;Family;Patient and family together  Visit Type Follow-up;Spiritual support  Referral From Nurse  Spiritual Encounters  Spiritual Needs Emotional;Prayer  Stress Factors  Family Stress Factors Health changes  CH offered spiritual, emotional support along with prayer; CH called after RN saw daughter of Patient crying; CH offered daughter additional support; daughter overwhelmed by current patient health status.

## 2017-08-09 NOTE — Progress Notes (Signed)
Nutrition Follow Up Note   DOCUMENTATION CODES:   Severe malnutrition in context of chronic illness  INTERVENTION:   Pt would most likely need a feeding tube and nutrition support to meet her estimated needs; this may not be in line with pt's goals of care  Boost Breeze po TID, each supplement provides 250 kcal and 9 grams of protein.  Magic cup TID with meals, each supplement provides 290 kcal and 9 grams of protein.  Rena-vite po QHS.  MVI po twice weekly.  Vitamin C 500 mg po BID.  NUTRITION DIAGNOSIS:   Severe Malnutrition related to chronic illness(CHF, ESRD on HD, abdominal pain and nausea causing poor PO intake) as evidenced by severe fat depletion, severe muscle depletion.  GOAL:   Patient will meet greater than or equal to 90% of their needs  MONITOR:   PO intake, Supplement acceptance, Labs, Weight trends, Skin, I & O's  ASSESSMENT:   67 year old female with PMHx of anemia, HTN, hx gastric ulcer, CAD, CHF, s/p placement of cardiac pacemaker, ESRD on HD, polycystic kidney disease, constipation, glaucoma admitted with diffuse abdominal pain due to large volume ascites, also HD catheter not working.   Pt continues to do poorly; eating <25% of meals and refusing most of her supplements. Pt would most likely need a feeding tube with nutrition support to meet her estimated needs; this may not be in line with pt's goals of care. Palliative following; goals of care being discussed. RD will continue to monitor for needs.    Medications reviewed and include: aspirin, Colace, Rena-vite QHS, ciprofloxacin, heparin, MVI, vit C, morphine  Labs reviewed: K 4.9 wnl, P 4.9(H)- 2/5  Diet Order:  DIET SOFT Room service appropriate? Yes; Fluid consistency: Thin  EDUCATION NEEDS:   Education needs have been addressed  Skin:  Reviewed RN Assessment  Last BM:  2/4  Height:   Ht Readings from Last 1 Encounters:  08/07/17 5\' 5"  (1.651 m)    Weight:   Wt Readings from Last  1 Encounters:  08/08/17 117 lb 11.6 oz (53.4 kg)    Ideal Body Weight:  59.1 kg  BMI:  Body mass index is 19.59 kg/m.  Estimated Nutritional Needs:   Kcal:  1565-1825 (30-35 kcal/kg)  Protein:  78-88 grams (1.5-1.7 grams/kg)  Fluid:  UOP + 1 L  Betsey Holidayasey Tripton Ned MS, RD, LDN Pager #- [phone] After Hours Pager: [phone]

## 2017-08-09 NOTE — Discharge Summary (Signed)
Melissa Bradshaw, is a 67 y.o. female  DOB [birth year]  MRN [id number].  Admission date:  08/04/2017  Admitting Physician  Amelia Jo, MD  Discharge Date:  08/09/2017   Primary MD  Cletis Athens, MD  Recommendations for primary care physician for things to follow:  Going to home with hospice today.   Admission Diagnosis  End stage renal disease ([place]) [N18.6] Abdominal pain, unspecified abdominal location [R10.9] Hypotension, unspecified hypotension type [I95.9]   Discharge Diagnosis  End stage renal disease ([place]) [N18.6] Abdominal pain, unspecified abdominal location [R10.9] Hypotension, unspecified hypotension type [I95.9]    Active Problems:   Abdominal pain      Past Medical History:  Diagnosis Date  . Anemia   . Aneurysm ([place])    Cerebral, [place], [place]  . Arthritis   . Cardiac pacemaker   . CHF (congestive heart failure) ([place])   . Chronic kidney disease    ESRD  . Constipation   . Coronary artery disease   . Dialysis patient ([place])    Tues, Thurs, Sat  . Gastric ulcer   . Glaucoma (increased eye pressure)   . Headache   . Hypertension   . Presence of permanent cardiac pacemaker     Past Surgical History:  Procedure Laterality Date  . ANEURYSM COILING    . AV FISTULA PLACEMENT Left 07/20/2016   Procedure: ARTERIOVENOUS (AV) FISTULA CREATION;  Surgeon: Katha Cabal, MD;  Location: [facility];  Service: Vascular;  Laterality: Left;  . AV FISTULA PLACEMENT Left 08/26/2016   Procedure: INSERTION OF ARTERIOVENOUS (AV) GORE-TEX GRAFT ARM;  Surgeon: Katha Cabal, MD;  Location: [facility] OR[facility];  Service: Vascular;  Laterality: Left;  . COLONOSCOPY     2013  . DIALYSIS/PERMA CATHETER INSERTION N/A 06/22/2017   Procedure: DIALYSIS/PERMA CATHETER INSERTION;  Surgeon: Algernon Huxley, MD;  Location:  [facility];  Service: Cardiovascular;  Laterality: N/A;  . DIALYSIS/PERMA CATHETER INSERTION N/A 06/23/2017   Procedure: DIALYSIS/PERMA CATHETER INSERTION;  Surgeon: Algernon Huxley, MD;  Location: [facility];  Service: Cardiovascular;  Laterality: N/A;  . DIALYSIS/PERMA CATHETER REMOVAL N/A 10/11/2016   Procedure: Dialysis/Perma Catheter Removal;  Surgeon: Katha Cabal, MD;  Location: [facility];  Service: Cardiovascular;  Laterality: N/A;  . INSERT / REPLACE / REMOVE PACEMAKER    . IR RADIOLOGIST EVAL & MGMT  05/18/2017  . PACEMAKER PLACEMENT    . PERIPHERAL VASCULAR CATHETERIZATION N/A 06/02/2016   Procedure: Dialysis/Perma Catheter Insertion;  Surgeon: Algernon Huxley, MD;  Location: [facility];  Service: Cardiovascular;  Laterality: N/A;       History of present illness and  Hospital Course:     Kindly see H&P for history of present illness and admission details, please review complete Labs, Consult reports and Test reports for all details in brief  HPI  from the history and physical done on the day of admission 67 year old woman with ESRD on hemodialysis, CAD, CHF, ascites admitted on February 1 abdomen for abdominal pain, patient CT of abdomen did not reveal admit any problem.   Hospital Course  #1 acute on chronic abdominal pain likely secondary to ascites, even the patient had no evidence of fever or WBC because of her history of ascites started empirically on antibiotic. 2.  ESRD on hemodialysis but patient continued to have hypotension and did not tolerate the hemodialysis associated low blood pressure, elevated heart rate.  Consulted palliative care medicine to discuss  the goals of care, they met with family and the patient.  Patient chose to stop dialysis and focus on comfort/he will go via home with hospice, she can use Ativan, Roxanol for pain and also agitation.  Patient will get portable oxygen and concentrator.  Severe malnutrition and  cachexia'patient not eating or drinking much.  Started on Megace.  She has polycystic kidney and liver disease.  Patient would like to stop dialysis and proceed home with hospice.  So we are discharging her home today.  Discharge Condition: Stable   Follow UP      Discharge Instructions  and  Discharge Medications      Allergies as of 08/09/2017      Reactions   Hydralazine Hcl Other (See Comments)   Makes pt jittery, nervous, messes with vision    Iodine Itching, Rash   Unknown reaction   Ivp Dye [iodinated Diagnostic Agents] Rash      Medication List    STOP taking these medications   aspirin 81 MG tablet   lidocaine-prilocaine cream Commonly known as:  EMLA   saccharomyces boulardii 250 MG capsule Commonly known as:  FLORASTOR   vancomycin 250 MG capsule Commonly known as:  VANCOCIN     TAKE these medications   calcium acetate 667 MG capsule Commonly known as:  PHOSLO Take 1,334 mg by mouth 3 (three) times daily with meals.   dicyclomine 10 MG capsule Commonly known as:  BENTYL Take 1 capsule (10 mg total) by mouth 3 (three) times daily before meals.   docusate sodium 100 MG capsule Commonly known as:  COLACE Take 1 capsule (100 mg total) by mouth 2 (two) times daily. What changed:    when to take this  reasons to take this   latanoprost 0.005 % ophthalmic solution Commonly known as:  XALATAN Place 1 drop into both eyes at bedtime.   LORazepam 2 MG/ML concentrated solution Commonly known as:  ATIVAN Take 0.5 mLs (1 mg total) by mouth every 4 (four) hours as needed for anxiety.   megestrol 400 MG/10ML suspension Commonly known as:  MEGACE Take 10 mLs (400 mg total) by mouth 2 (two) times daily.   midodrine 10 MG tablet Commonly known as:  PROAMATINE Take 1 tablet (10 mg total) by mouth 3 (three) times daily with meals.   morphine CONCENTRATE 10 MG/0.5ML Soln concentrated solution Take 0.5 mLs (10 mg total) by mouth every hour as needed for  moderate pain, severe pain or shortness of breath.   multivitamin Tabs tablet Take 1 tablet by mouth at bedtime.   ondansetron 4 MG disintegrating tablet Commonly known as:  ZOFRAN ODT Take 1 tablet (4 mg total) by mouth every 8 (eight) hours as needed for nausea or vomiting.         Diet and Activity recommendation: See Discharge Instructions above   Consults obtained -nephrology, palliative care, hospice liaison, registered dietitian  Major procedures and Radiology Reports - PLEASE review detailed and final reports for all details, in brief -      Ct Abdomen Pelvis Wo Contrast  Result Date: 08/04/2017 CLINICAL DATA:  Abdominal pain.  Patient unable to have dialysis. EXAM: CT ABDOMEN AND PELVIS WITHOUT CONTRAST TECHNIQUE: Multidetector CT imaging of the abdomen and pelvis was performed following the standard protocol without IV contrast. COMPARISON:  06/01/2017 FINDINGS: Lower chest: Mild right basilar atelectasis. Hepatobiliary: Innumerable cysts throughout the liver with overall hepatomegaly. Again noted is increased density in the 9 cm cyst in the left  hepatic lobe anteriorly. Normal gallbladder. Pancreas: Unremarkable. No pancreatic ductal dilatation or surrounding inflammatory changes. Spleen: Normal in size without focal abnormality. Adrenals/Urinary Tract: Adrenal glands are suboptimally visualized. Bilateral nephromegaly with innumerable cysts throughout bilateral kidneys. Some of the cysts demonstrate hyperdense material within them likely reflecting a small amount of hemorrhage. Bilateral renovascular calcifications. Bladder is unremarkable. Stomach/Bowel: Stomach is within normal limits. Appendix appears normal. No evidence of bowel wall thickening, distention, or inflammatory changes. Vascular/Lymphatic: Abdominal aortic atherosclerosis. Normal caliber abdominal aorta. No lymphadenopathy. Reproductive: Uterus and bilateral adnexa are unremarkable. Other: Large volume abdominal  ascites.  No abdominal wall hernia. Musculoskeletal: No acute osseous abnormality. No aggressive osseous lesion. Degenerative disc disease disc height loss and a broad-based disc bulge at L5-S1. IMPRESSION: 1. Polycystic kidney disease again noted. 2. Extensive innumerable cysts throughout the liver. 3. Large volume ascites. 4.  Aortic Atherosclerosis (ICD10-I70.0). Electronically Signed   By: Kathreen Devoid   On: 08/04/2017 19:48   US Abdomen Limited  Result Date: 07/25/2017 CLINICAL DATA:  End-stage renal disease, ascites, abdominal distension EXAM: LIMITED ABDOMEN ULTRASOUND FOR ASCITES TECHNIQUE: Limited ultrasound survey for ascites was performed in all four abdominal quadrants. COMPARISON:  07/18/2017 FINDINGS: Small amount of ascites in the right lower quadrant. Not enough to warrant therapeutic paracentesis today. Patient agrees with this. Chronic polycystic kidney disease noted. IMPRESSION: Small amount of right lower quadrant ascites. Paracentesis will be deferred. Electronically Signed   By: Jerilynn Mages.  Shick M.D.   On: 07/25/2017 11:53   US Abdomen Limited  Result Date: 07/18/2017 CLINICAL DATA:  Polycystic disease involving kidneys in liver. Recurrent abdominal ascites and abdominal distention. EXAM: LIMITED ABDOMEN ULTRASOUND FOR ASCITES TECHNIQUE: Limited ultrasound survey for ascites was performed in all four abdominal quadrants. COMPARISON:  07/05/2017 FINDINGS: There is a small amount of scattered ascites in the lower abdomen right greater than left. No significant large pocket is identified for large volume therapeutic paracentesis, which was therefore deferred. IMPRESSION: 1. Small amount of residual/recurrent abdominal ascites. No large pocket for large volume therapeutic paracentesis. Electronically Signed   By: Lucrezia Europe M.D.   On: 07/18/2017 10:58   US Paracentesis  Result Date: 08/02/2017 INDICATION: 67 year old female with a history of recurrent ascites EXAM: ULTRASOUND GUIDED   PARACENTESIS MEDICATIONS: None. COMPLICATIONS: None PROCEDURE: Informed written consent was obtained from the patient after a discussion of the risks, benefits and alternatives to treatment. A timeout was performed prior to the initiation of the procedure. Initial ultrasound scanning demonstrates a large amount of ascites within the right lower abdominal quadrant. The right lower abdomen was prepped and draped in the usual sterile fashion. 1% lidocaine with epinephrine was used for local anesthesia. Following this, a 8 Fr Safe-T-Centesis catheter was introduced. An ultrasound image was saved for documentation purposes. The paracentesis was performed. The catheter was removed and a dressing was applied. The patient tolerated the procedure well without immediate post procedural complication. FINDINGS: A total of approximately 3.6 L of thin yellow fluid was removed. IMPRESSION: Status post ultrasound-guided paracentesis. Signed, Dulcy Fanny. Earleen Newport, DO [facility] Electronically Signed   By: Corrie Mckusick D.O.   On: 08/02/2017 15:05   Dg Chest Port 1 View  Result Date: 08/07/2017 CLINICAL DATA:  Acute respiratory failure EXAM: PORTABLE CHEST 1 VIEW COMPARISON:  July 11, 2017 FINDINGS: A right central line terminates near the caval atrial junction. The heart, hila, mediastinum, lungs, and pleura are otherwise unremarkable. IMPRESSION: No active disease. Electronically Signed  By: Dorise Bullion III M.D   On: 08/07/2017 22:31   Dg Chest Port 1 View  Result Date: 07/11/2017 CLINICAL DATA:  Weakness, shortness of Breath EXAM: PORTABLE CHEST 1 VIEW COMPARISON:  06/21/2017 FINDINGS: Interval placement of right dialysis catheter with the tip in the right atrium. Left pacer is unchanged. Heart is normal size. Lungs are clear. No effusions or acute bony abnormality. IMPRESSION: No active disease. Electronically Signed   By: Rolm Baptise M.D.   On: 07/11/2017 15:54    Dg Abd 2 Views  Result Date: 07/16/2017 CLINICAL DATA:  Emesis for 2-3 days. Chronic kidney disease. Chronic dialysis. EXAM: ABDOMEN - 2 VIEW COMPARISON:  CT abdomen 06/01/2017 FINDINGS: Visualized bowel gas is primarily in the right displacement of bowel structures by the patient's known polycystic kidney disease and polycystic liver disease, currently there is gas in the right colon and some gas projecting the bowel is displaced by the patient's enlarged liver and kidneys; the patient has known polycystic liver disease and polycystic kidney disease. There is a hazy density along the abdomen quite likely indicating ascites, with the enlarged liver and kidneys likely prevent in the normal centralization of bowel loops that occurs in ascites. Curvilinear calcifications along both kidneys are chronically stable. Aortoiliac atherosclerotic vascular disease. Dialysis catheter tip projects over the right atrium. Subsegmental atelectasis or scarring along both hemidiaphragms. IMPRESSION: 1. No visualized dilated bowel. 2. Known polycystic liver and polycystic kidney disease, with the enlarged liver and kidneys displacing bowel peripherally and towards the pelvis. The relatively gasless appearance adversely affects negative predictive value. 3. Hazy opacity over the abdomen likely reflecting ascites. 4. Mild bibasilar atelectasis. 5.  Aortic Atherosclerosis (ICD10-I70.0). Electronically Signed   By: Van Clines M.D.   On: 07/16/2017 16:54    Micro Results     Recent Results (from the past 240 hour(s))  MRSA PCR Screening     Status: None   Collection Time: 08/07/17 10:07 PM  Result Value Ref Range Status   MRSA by PCR NEGATIVE NEGATIVE Final    Comment:        The GeneXpert MRSA Assay (FDA approved for NASAL specimens only), is one component of a comprehensive MRSA colonization surveillance program. It is not intended to diagnose MRSA infection nor to guide or monitor treatment for MRSA  infections. Performed at [facility], [address] [place], [place] [address]        Today   Subjective:   Melissa Bradshaw today able for discharge home with hospice.  Objective:   Blood pressure (!) 88/53, pulse (!) 118, temperature 98.1 F (36.7 C), temperature source Oral, resp. rate 16, height _0  (1.651 m), weight 53.4 kg (117 lb 11.6 oz), SpO2 (!) 82 %.   Intake/Output Summary (Last 24 hours) at 08/09/2017 1424 Last data filed at 08/09/2017 1000 Gross per 24 hour  Intake 230 ml  Output -607 ml  Net 837 ml    Exam Awake Alert, Oriented x 3,  Cachectic.   [place].AT,PERRAL Supple Neck,No JVD, No cervical lymphadenopathy appriciated.  Symmetrical Chest wall movement, Good air movement bilaterally, CTAB RRR,No Gallops,Rubs or new Murmurs, No Parasternal Heave +ve B.Sounds, filled with ascites with umbilical hernia.  No Cyanosis, Clubbing or edema, No new Rash or bruise  Data Review   CBC w Diff:  Lab Results  Component Value Date   WBC 7.8 08/05/2017   HGB 12.0 08/05/2017   HGB 8.8 (L) 10/09/2014   HCT 36.3 08/05/2017   HCT  28.3 (L) 05/22/2014   PLT 120 (L) 08/05/2017   PLT 236 05/22/2014   LYMPHOPCT 27 06/21/2017   LYMPHOPCT 29.7 05/22/2014   MONOPCT 10 06/21/2017   MONOPCT 6.3 05/22/2014   EOSPCT 1 06/21/2017   EOSPCT 2.1 05/22/2014   BASOPCT 1 06/21/2017   BASOPCT 0.9 05/22/2014    CMP:  Lab Results  Component Value Date   NA 138 08/08/2017   NA 141 01/30/2014   K 4.9 08/08/2017   K 5.0 01/30/2014   CL 102 08/08/2017   CL 116 (H) 01/30/2014   CO2 22 08/08/2017   CO2 17 (L) 01/30/2014   BUN 29 (H) 08/08/2017   BUN 50 (H) 06/19/2014   CREATININE 9.50 (H) 08/08/2017   CREATININE 3.33 (H) 06/19/2014   PROT 5.2 (L) 08/04/2017   PROT 7.1 05/14/2013   ALBUMIN 2.0 (L) 08/08/2017   ALBUMIN 3.9 01/30/2014   BILITOT 0.8 08/04/2017   BILITOT 0.3 05/14/2013   ALKPHOS 98 08/04/2017   ALKPHOS 44 (L) 05/14/2013   AST 37 08/04/2017   AST 14  (L) 05/14/2013   ALT 57 (H) 08/04/2017   ALT 13 05/14/2013  .   Total Time in preparing paper work, data evaluation and todays exam - 30 minutes  Epifanio Lesches M.D on 08/09/2017 at 2:24 PM    Note: This dictation was prepared with Dragon dictation along with smaller phrase technology. Any transcriptional errors that result from this process are unintentional.

## 2017-08-09 NOTE — Progress Notes (Signed)
PT Cancellation Note  Patient Details Name: Tristan SchroederCaroline J Cathy MRN: [id number] DOB: 04/13/1951   Cancelled Treatment:    Reason Eval/Treat Not Completed: Patient not medically ready; Nursing requested no PT evaluation this date secondary to pt currently presenting with nausea and vomiting and is preparing for discharge home under the care of hospice services.     Ovidio Hanger. Scott Makari Sanko PT, DPT 08/09/17, 3:11 PM

## 2017-08-09 NOTE — Care Management Important Message (Signed)
Important Message  Patient Details  Name: Melissa SchroederCaroline J Germond MRN: [id number] Date of Birth: 06/25/1951   Medicare Important Message Given:  Yes    Chapman FitchBOWEN, Harkirat Orozco T, RN 08/09/2017, 2:28 PM

## 2017-08-10 DIAGNOSIS — N182 Chronic kidney disease, stage 2 (mild): Secondary | ICD-10-CM | POA: Diagnosis not present

## 2017-08-10 DIAGNOSIS — Z950 Presence of cardiac pacemaker: Secondary | ICD-10-CM | POA: Diagnosis not present

## 2017-08-10 DIAGNOSIS — D638 Anemia in other chronic diseases classified elsewhere: Secondary | ICD-10-CM | POA: Diagnosis not present

## 2017-08-10 DIAGNOSIS — R162 Hepatomegaly with splenomegaly, not elsewhere classified: Secondary | ICD-10-CM | POA: Diagnosis not present

## 2017-08-10 DIAGNOSIS — I959 Hypotension, unspecified: Secondary | ICD-10-CM | POA: Diagnosis not present

## 2017-08-10 NOTE — Progress Notes (Addendum)
SATURATION QUALIFICATIONS: (This note is used to comply with regulatory documentation for home oxygen)  Patient Saturations on Room Air at Rest = 83%   On 08/10/17 prior to discharge patient oxygen saturations at rest were 83%. Patient did not look to be in distressed breathing and stated that she felt that she could breath fine.

## 2017-09-01 DEATH — deceased

## 2018-10-26 IMAGING — CT CT ABD-PELV W/O CM
2 of 4 series · 16 of 46 positions shown, 18 images · non-contrast
Comparison: Abdomen pelvis CT dated 06/19/2015 and limited right
upper quadrant abdomen ultrasound dated 05/09/2016.

CLINICAL DATA: Abdominal distention. Known polycystic kidney and
liver disease.

EXAM:
CT ABDOMEN AND PELVIS WITHOUT CONTRAST
TECHNIQUE: Multidetector CT imaging of the abdomen and pelvis was performed
following the standard protocol without IV contrast.

[Series 2: axial st · axial · 0.73mm/px · z∈[+55,+460]mm · 13 of 89 slices shown, 15 images]
[im 4/89  soft-tissue]
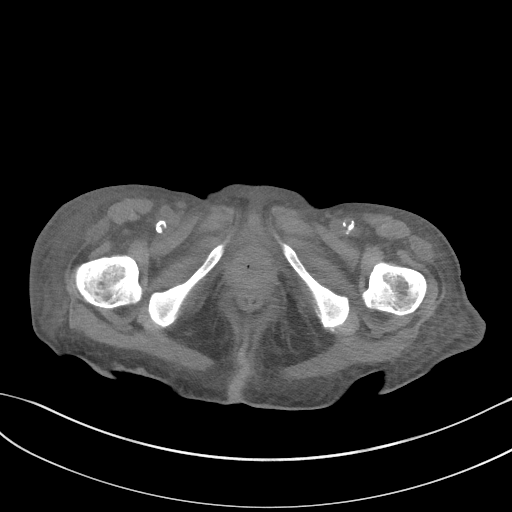
[im 4/89  bone]
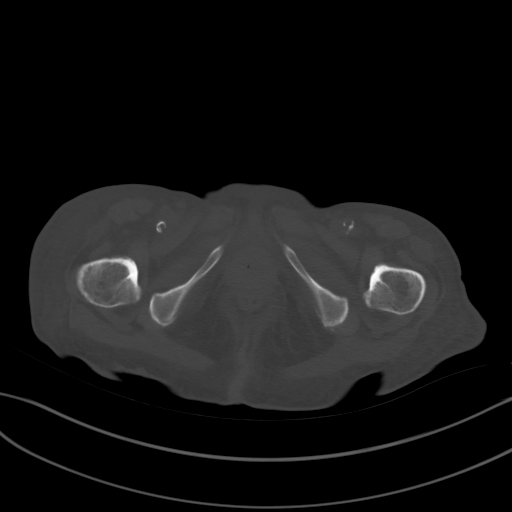
[im 11/89  soft-tissue]
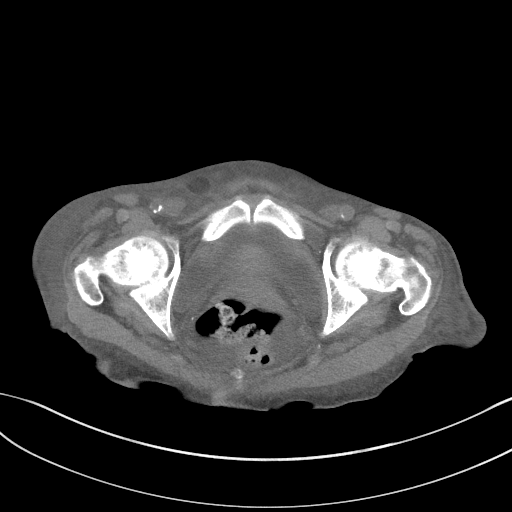
[im 18/89  soft-tissue]
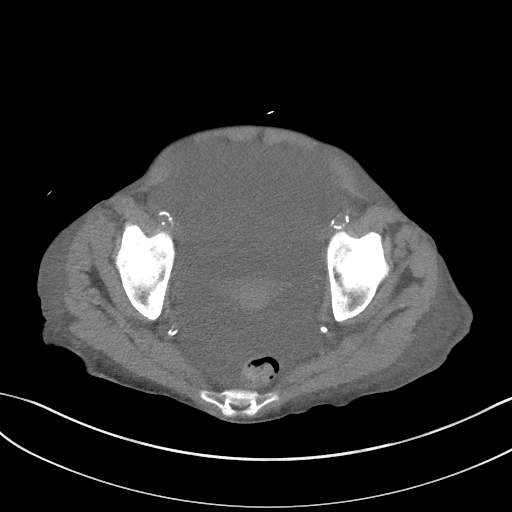
[im 25/89  soft-tissue]
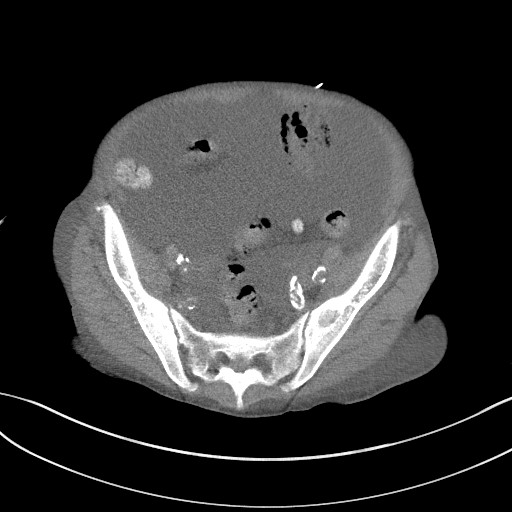
[im 32/89  soft-tissue]
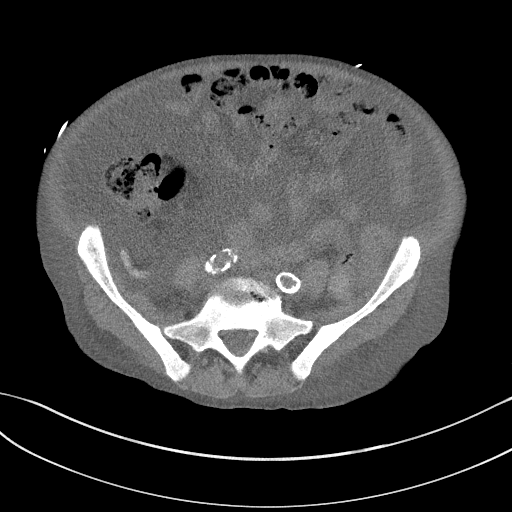
[im 39/89  soft-tissue]
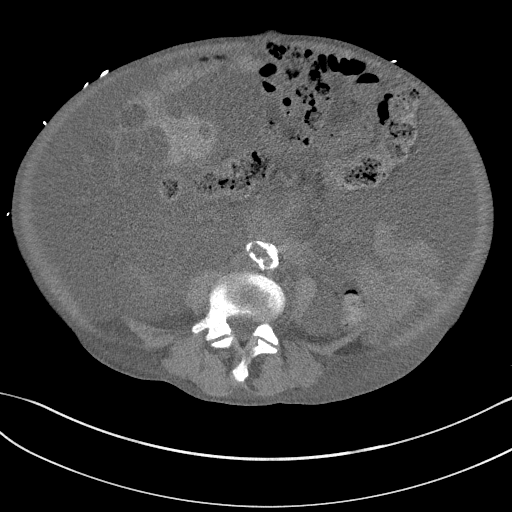
[im 46/89  soft-tissue]
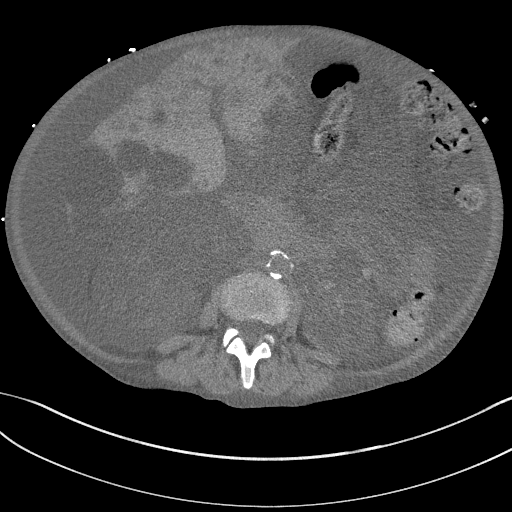
[im 50/89  soft-tissue]
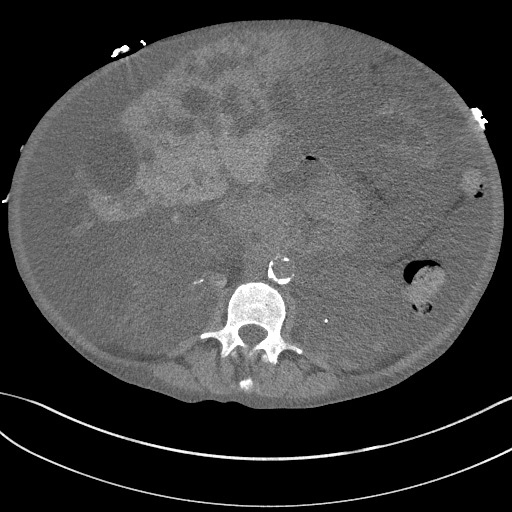
[im 57/89  soft-tissue]
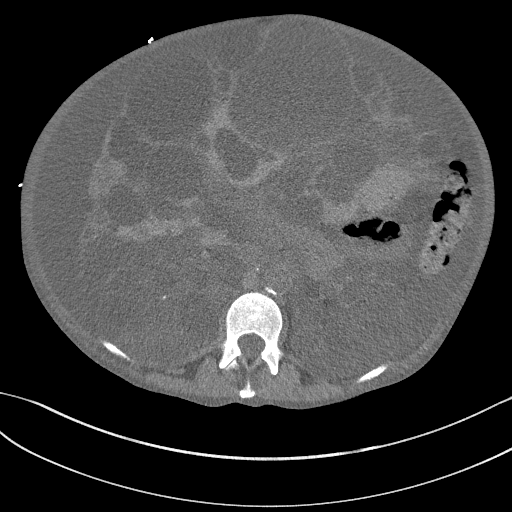
[im 57/89  bone]
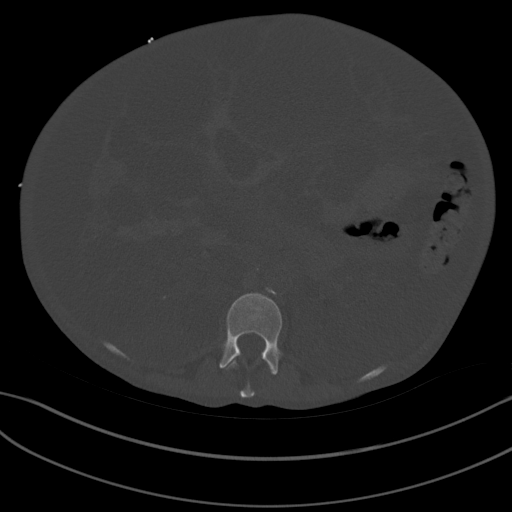
[im 64/89  soft-tissue]
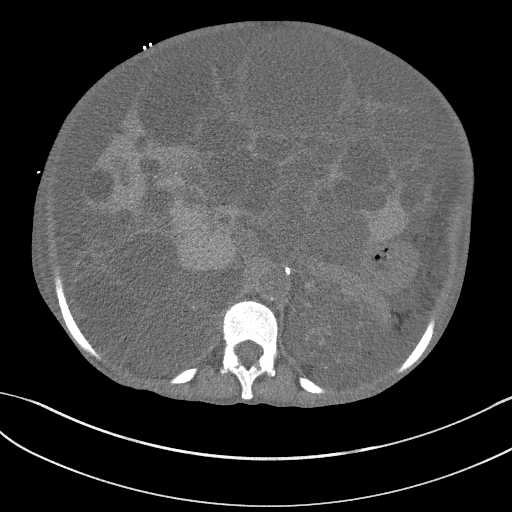
[im 71/89  soft-tissue]
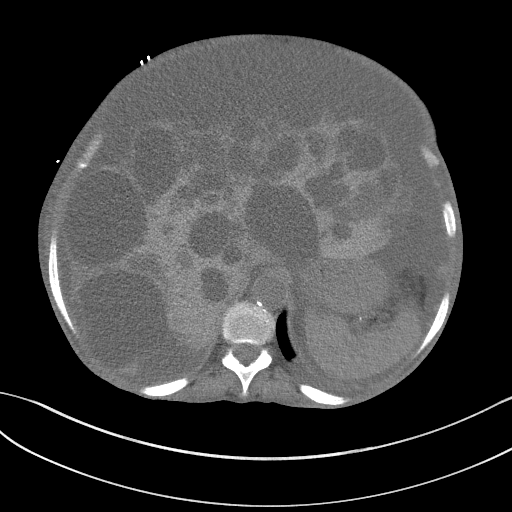
[im 78/89  soft-tissue]
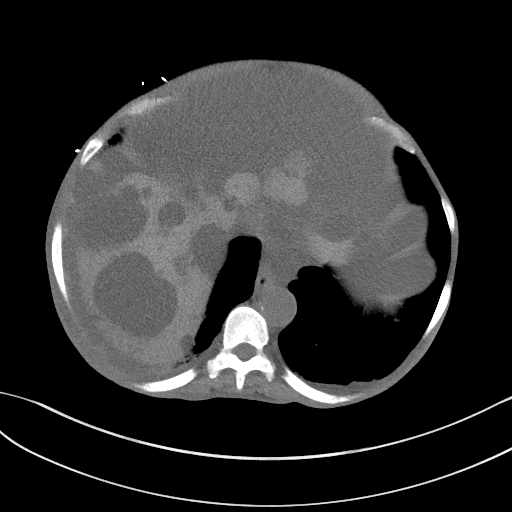
[im 85/89  soft-tissue]
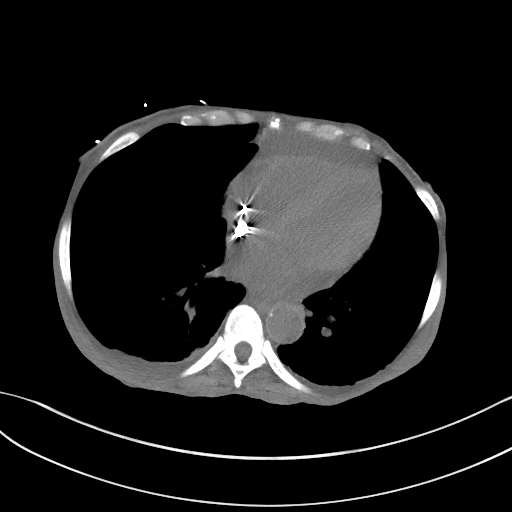

[Series 5: coronal st · coronal · 0.90mm/px · 3 of 100 slices shown]
[im 34/100  soft-tissue]
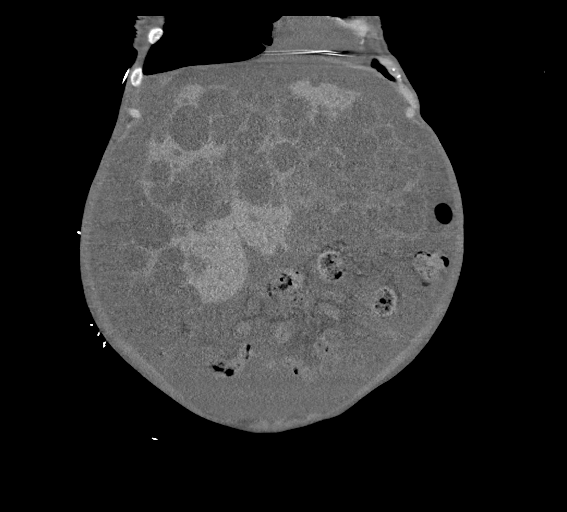
[im 45/100  soft-tissue]
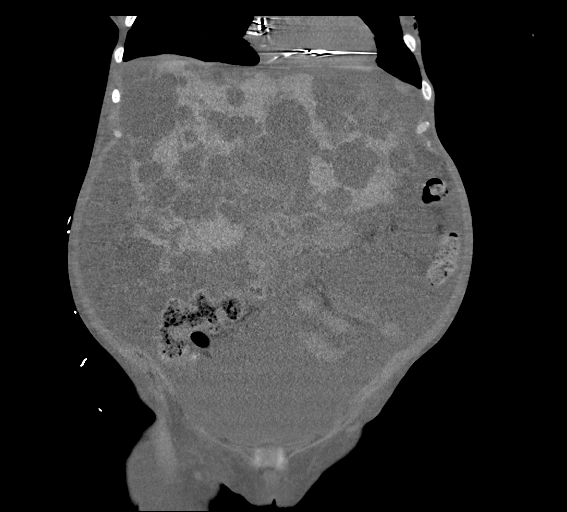
[im 56/100  soft-tissue]
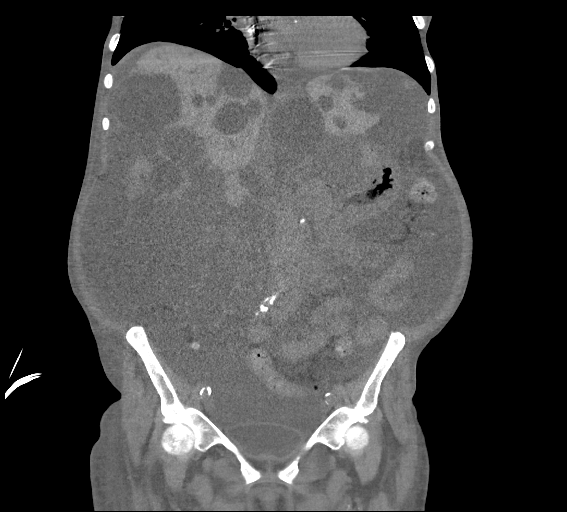

[16 of 46 positions shown; findings below may reference images not displayed]

FINDINGS: Lower chest: Mild bilateral lower lobe atelectasis. Minimal
bilateral pleural effusions. Pericardial effusion measuring 1.9 cm
in maximum thickness. Intracardiac pacemaker leads.

Hepatobiliary: A large number of liver cysts are again demonstrated.
The gallbladder is difficult to distinguish from the liver cysts.

Pancreas: Difficult to resolve due to a lack of intraperitoneal and
retroperitoneal fat. Grossly normal.

Spleen: Normal in size without focal abnormality.

Adrenals/Urinary Tract: The kidneys remain diffusely enlarged and
almost completely replaced by cysts, including small hemorrhagic
cysts. The adrenal glands are not well visualized. Unremarkable
urinary bladder. No visible urinary tract calculi or hydronephrosis.
There are intrarenal vascular calcifications.

Stomach/Bowel: Mild sigmoid colon diverticulosis without evidence of
diverticulitis. High density material in a normal appearing
appendix. No gastric or small bowel abnormalities are seen.

Vascular/Lymphatic: Extensive arterial calcifications, including the
abdominal aorta. No visible enlarged lymph nodes.

Reproductive: Uterus and bilateral adnexa are unremarkable.

Other: Large amount of ascites with anterior protrusion of the
umbilicus.

Musculoskeletal: Mild lumbar and lower thoracic spine degenerative
changes with more pronounced degenerative changes at the L5-S1
level.
IMPRESSION: 1. Large amount of ascites.
2. Previously demonstrated polycystic liver and kidneys.
3. Mild sigmoid diverticulosis.
4. Minimal bilateral pleural effusions.
5. Moderate-sized pericardial effusion.
6. Mild bilateral lower lobe atelectasis.

## 2018-12-18 IMAGING — US US PARACENTESIS
1 series · 9 of 9 positions shown · non-contrast
Comparison: none

INDICATION: Patient with recurrent ascites. Request is made for therapeutic
paracentesis of up to 6 liters maximum.

[Series 1: us paracentesis · 0.26mm/px · 9 of 9 slices shown]
[im 1/9]
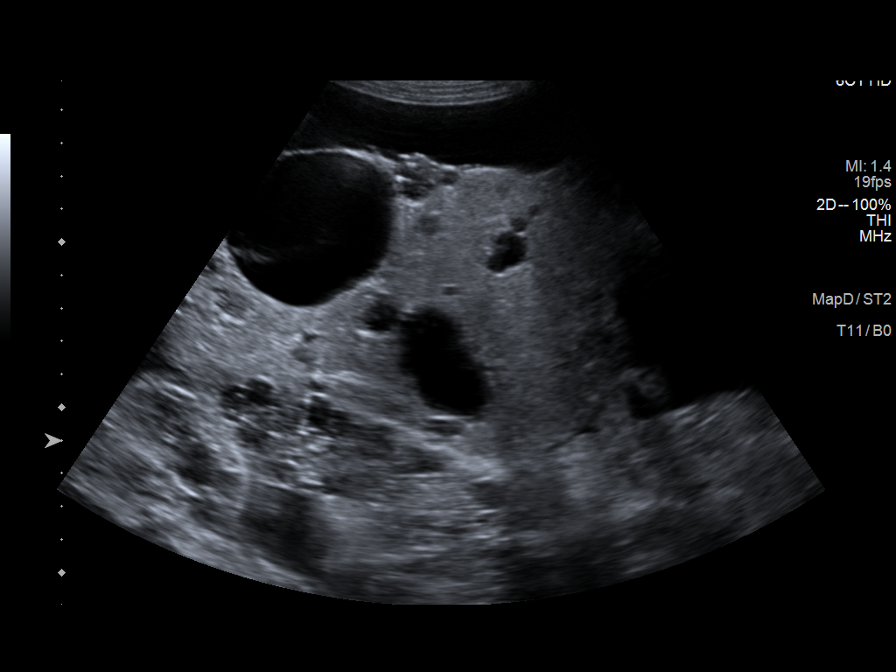
[im 2/9]
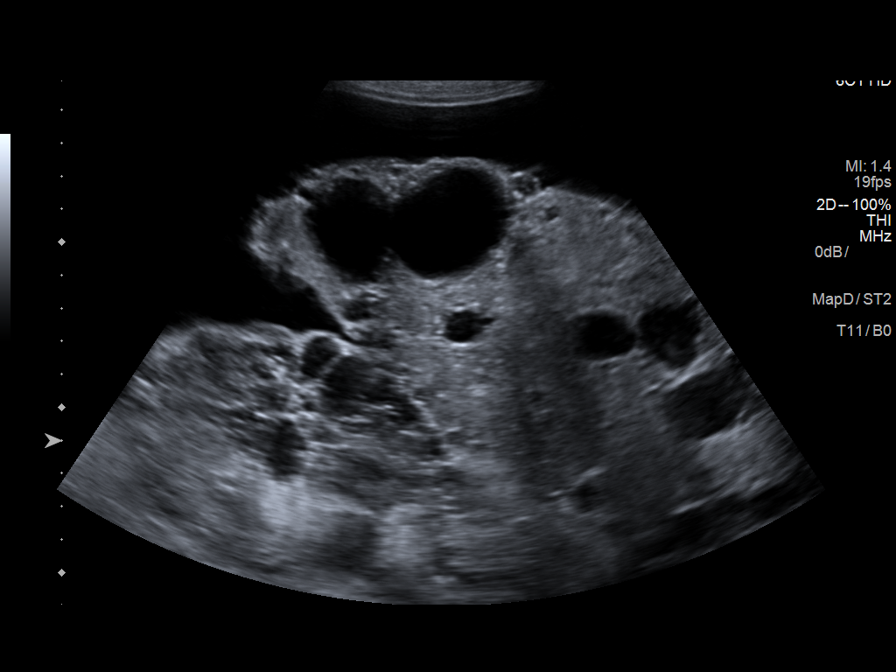
[im 3/9]
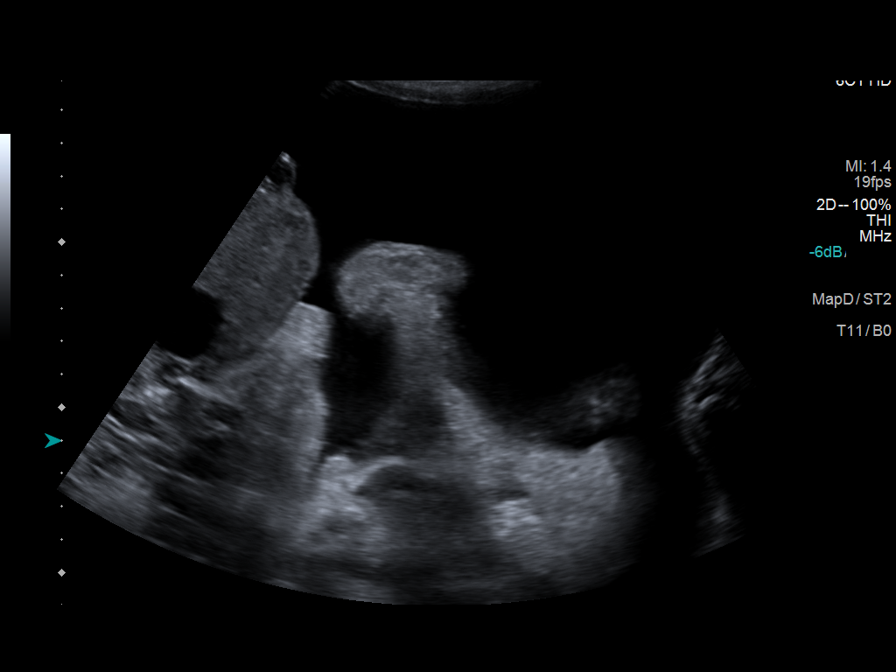
[im 4/9]
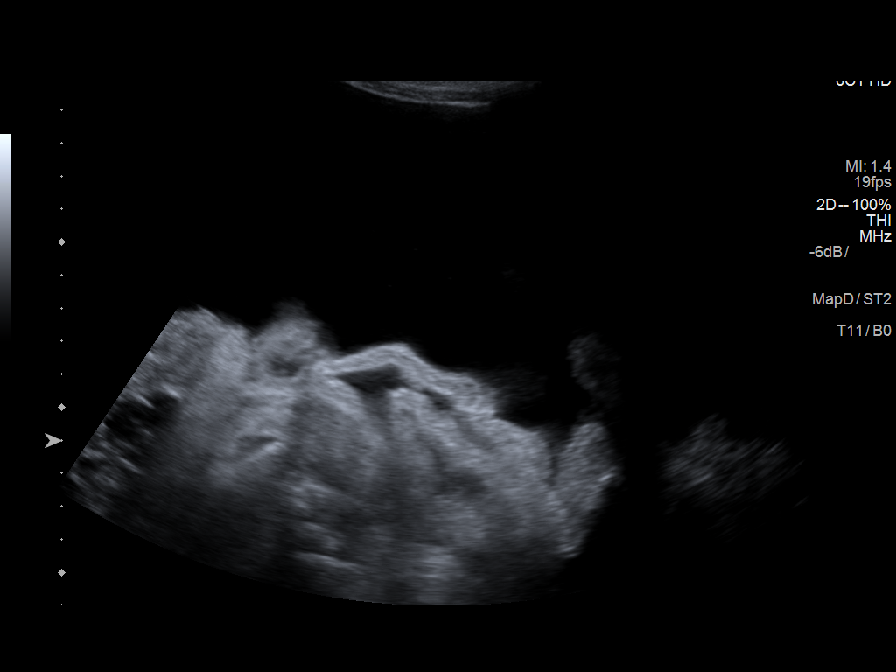
[im 5/9]
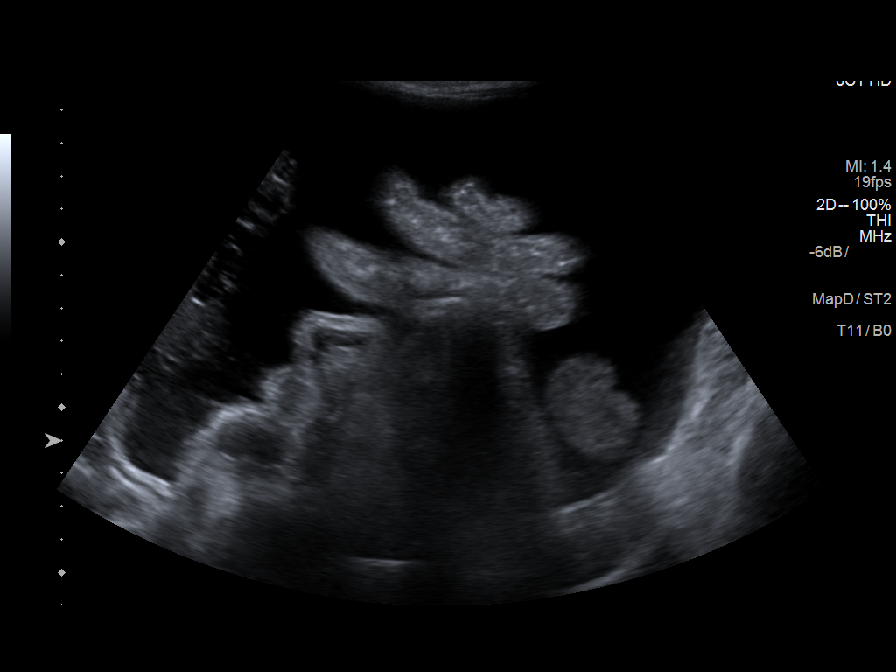
[im 6/9]
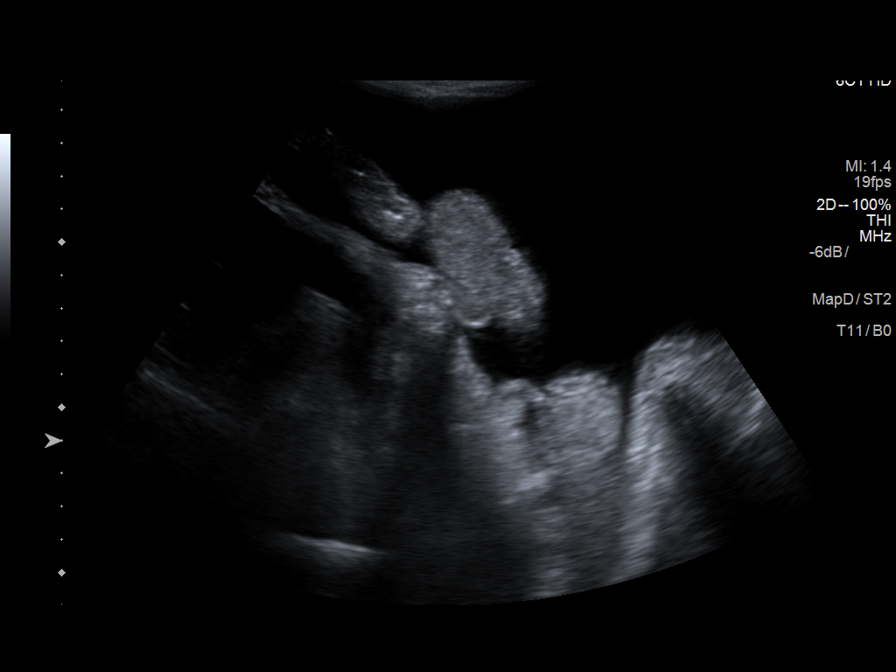
[im 7/9]
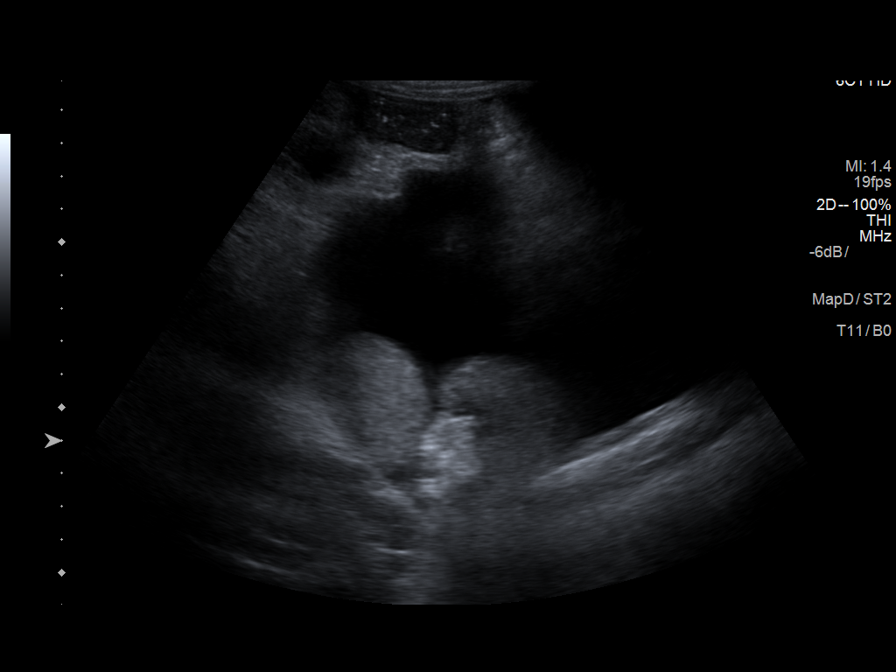
[im 8/9]
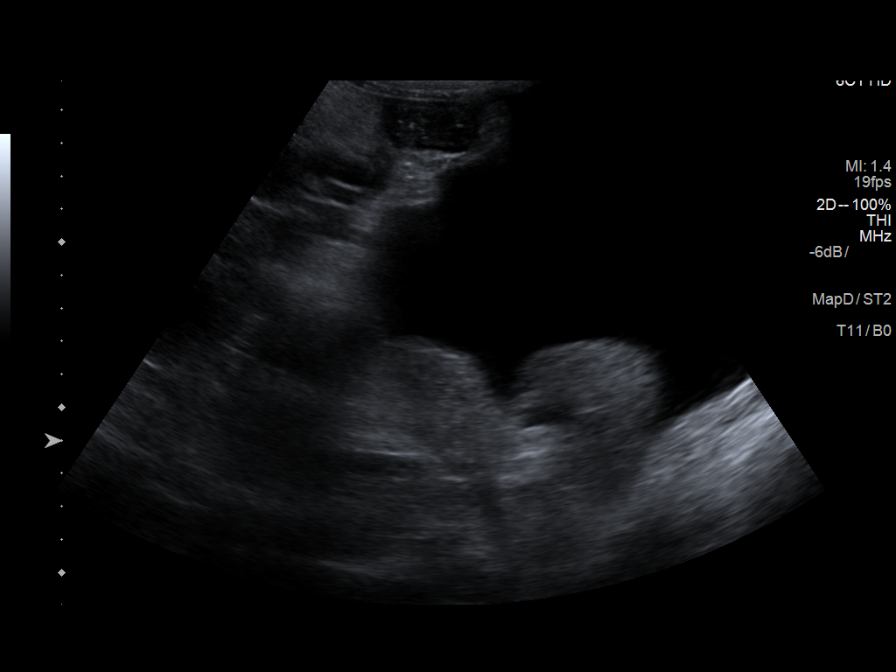
[im 9/9]
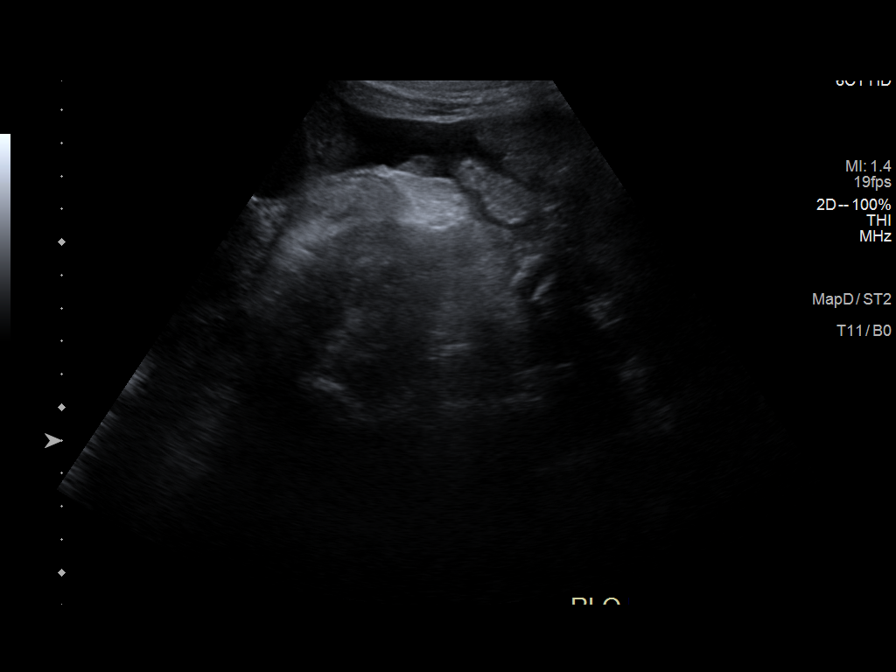

[9 of 9 positions shown; findings below may reference images not displayed]

EXAM:
ULTRASOUND GUIDED THERAPEUTIC PARACENTESIS

MEDICATIONS:
10 mL 1% lidocaine

COMPLICATIONS:
None immediate.

PROCEDURE:
Informed written consent was obtained from the patient after a
discussion of the risks, benefits and alternatives to treatment. A
timeout was performed prior to the initiation of the procedure.

Initial ultrasound scanning demonstrates a large amount of ascites
within the right lateral abdomen. The right lateral abdomen was
prepped and draped in the usual sterile fashion. 1% lidocaine was
used for local anesthesia.

Following this, a 19 gauge, 7-cm, Yueh catheter was introduced. An
ultrasound image was saved for documentation purposes. The
paracentesis was performed. The catheter was removed and a dressing
was applied. The patient tolerated the procedure well without
immediate post procedural complication.
FINDINGS: A total of approximately 5.2 liters of clear, yellow fluid was
removed.
IMPRESSION: Successful ultrasound-guided therapeutic paracentesis yielding
liters of peritoneal fluid. Patient to receive albumin
postprocedure.

## 2019-01-11 IMAGING — US US ABDOMEN LIMITED
1 series · 7 of 7 positions shown · non-contrast
Comparison: 06/01/2017 and 05/30/2017

CLINICAL DATA: Recurrent ascites. Paracentesis on 05/30/2017.
Nausea and pain.

EXAM:
LIMITED ABDOMEN ULTRASOUND FOR ASCITES
TECHNIQUE: Limited ultrasound survey for ascites was performed in all four
abdominal quadrants.

[Series 1: us abdomen limited · 0.26mm/px · 7 of 7 slices shown]
[im 1/7]
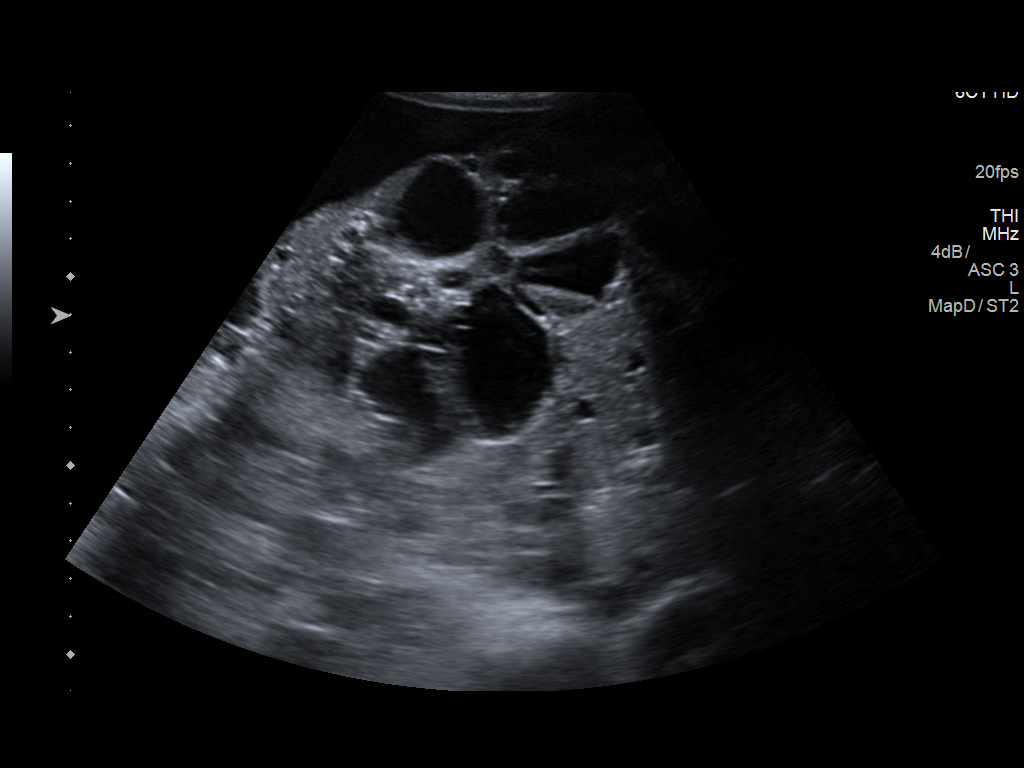
[im 2/7]
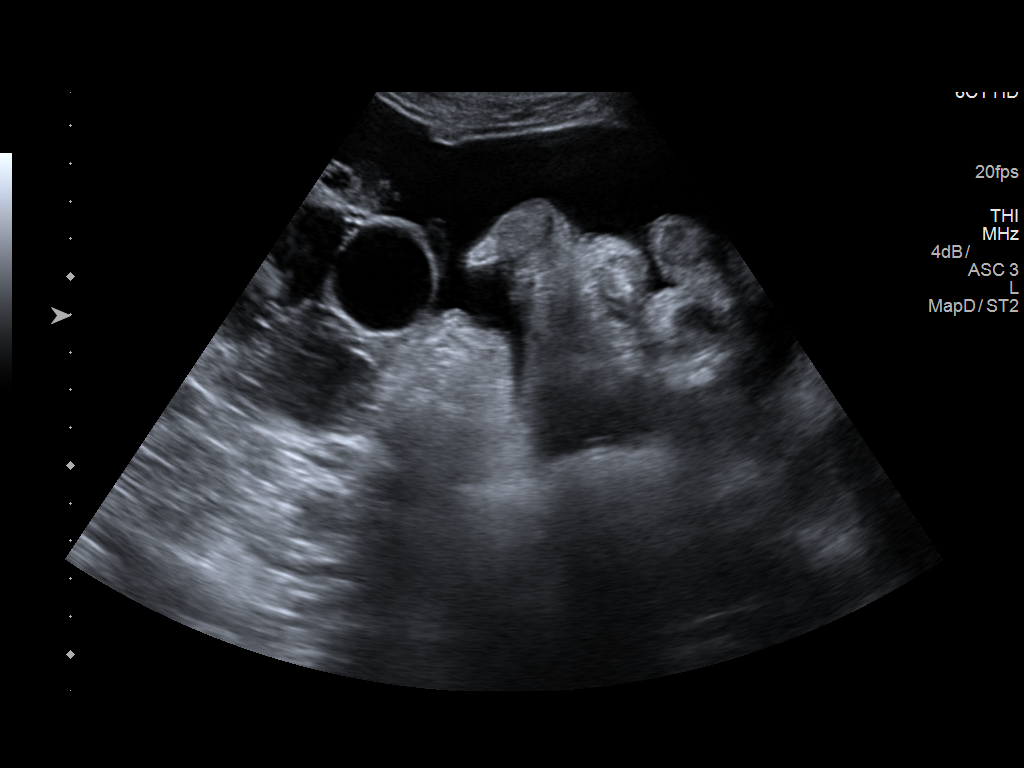
[im 3/7]
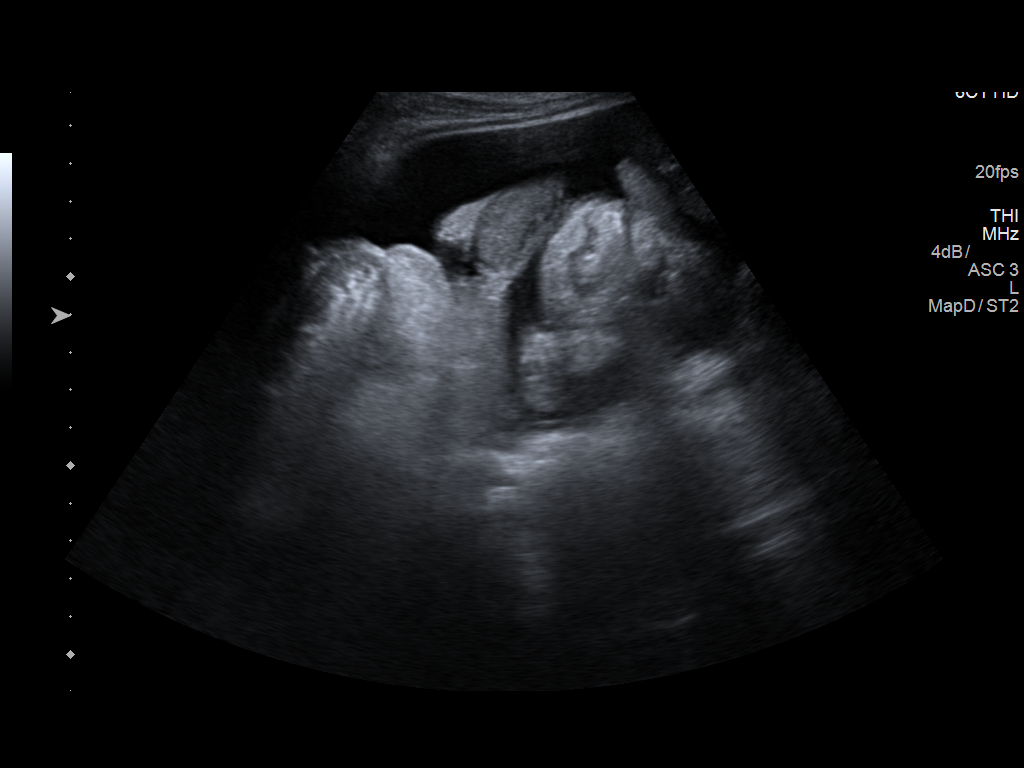
[im 4/7]
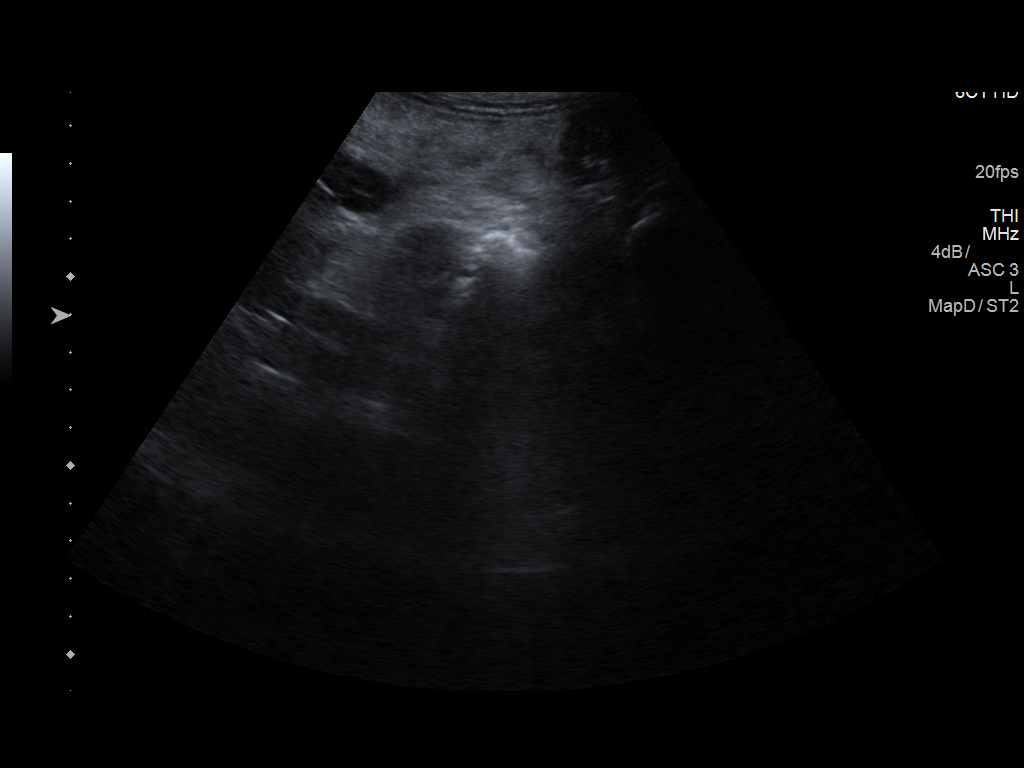
[im 5/7]
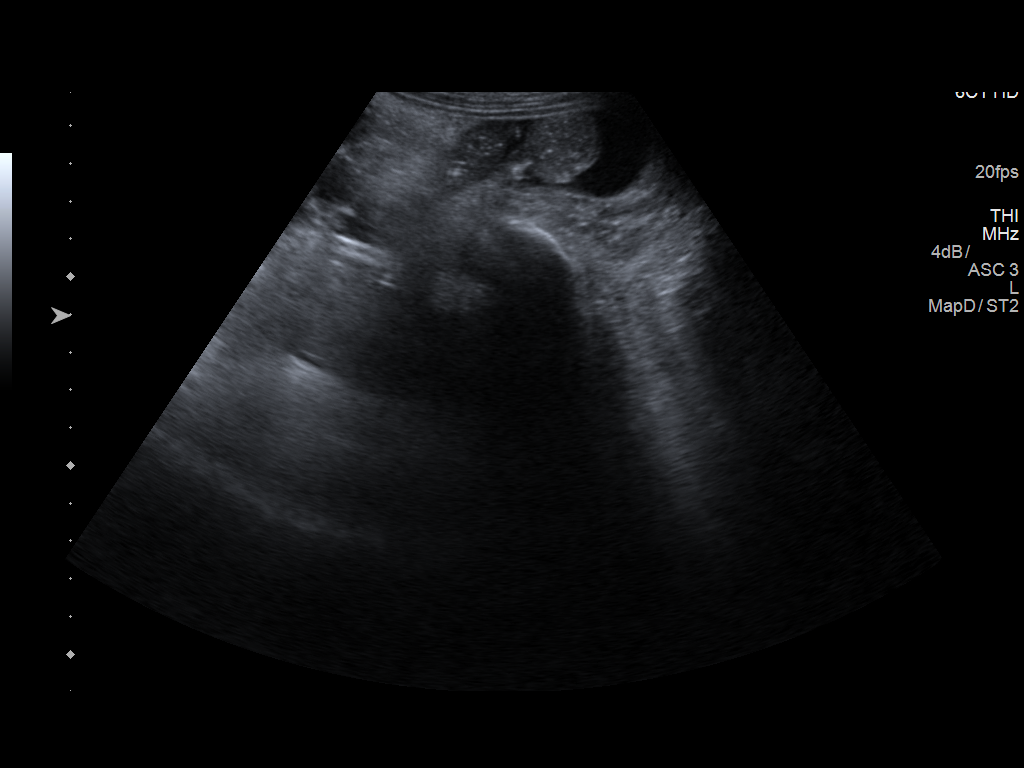
[im 6/7]
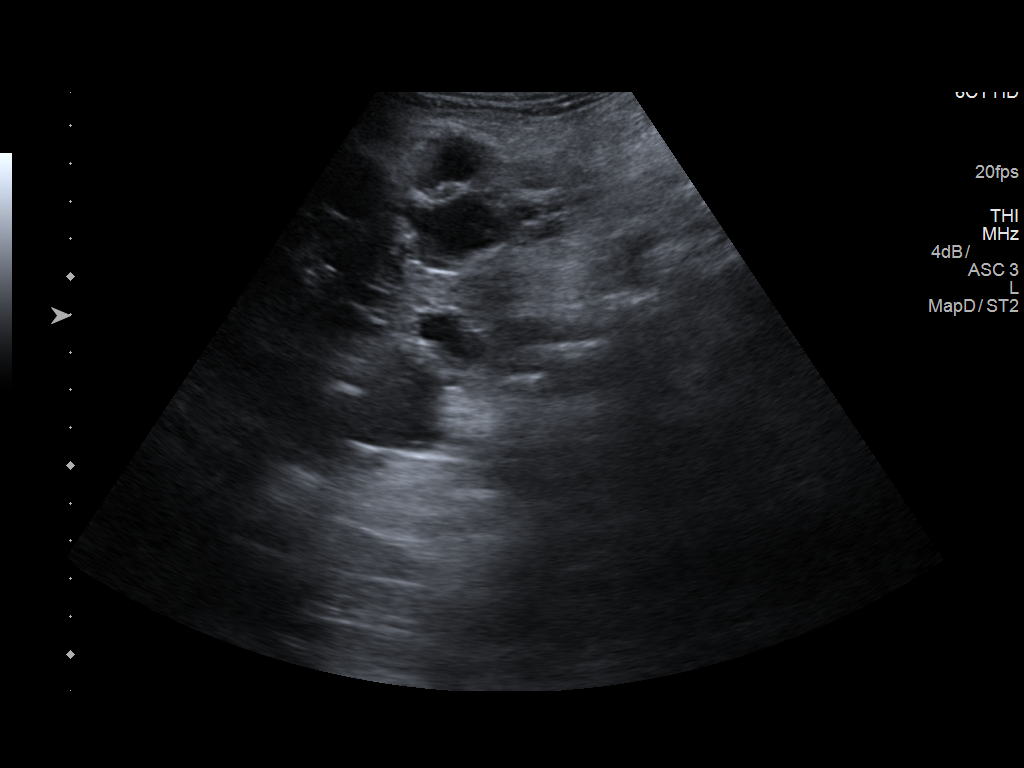
[im 7/7]
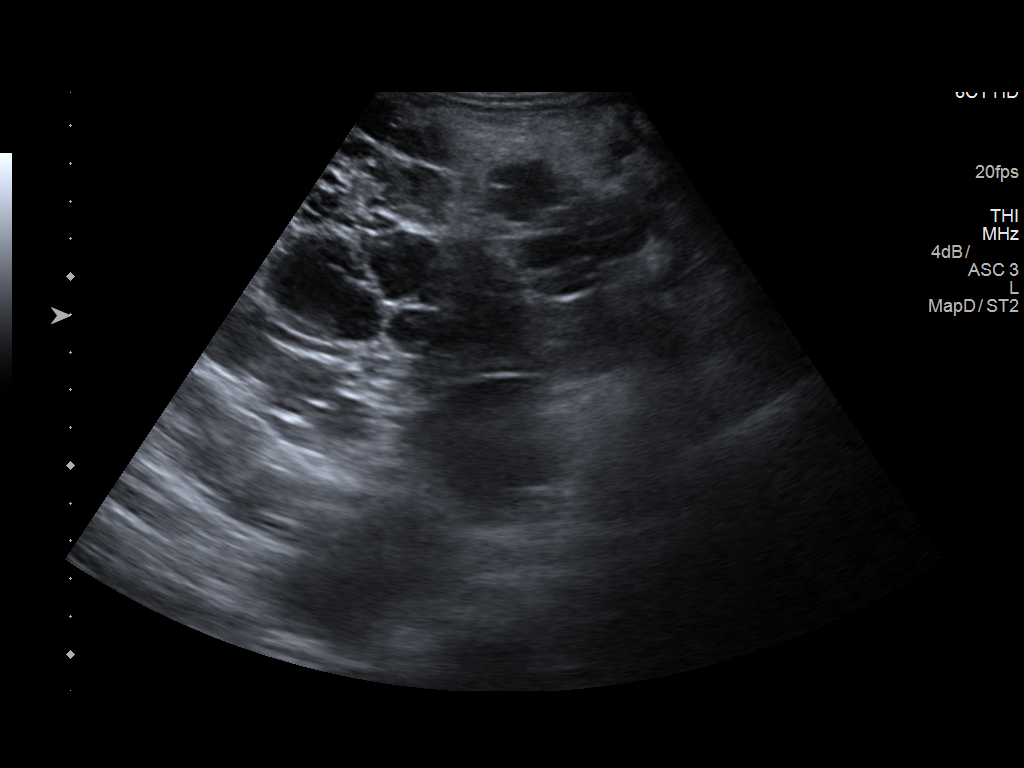

[7 of 7 positions shown; findings below may reference images not displayed]

FINDINGS: Small amount of ascites in the right upper quadrant and right lower
quadrant. No significant ascites in the left upper quadrant or left
lower quadrant. Again noted are multiple hepatic cysts.
IMPRESSION: Small amount of ascites in the right abdomen. Paracentesis was not
performed due to the small volume.

## 2019-02-07 IMAGING — US US PARACENTESIS
1 series · 4 of 4 positions shown · non-contrast
Comparison: none

INDICATION: Ascites

[Series 1: us paracentesis · 0.28mm/px · 4 of 4 slices shown]
[im 1/4]
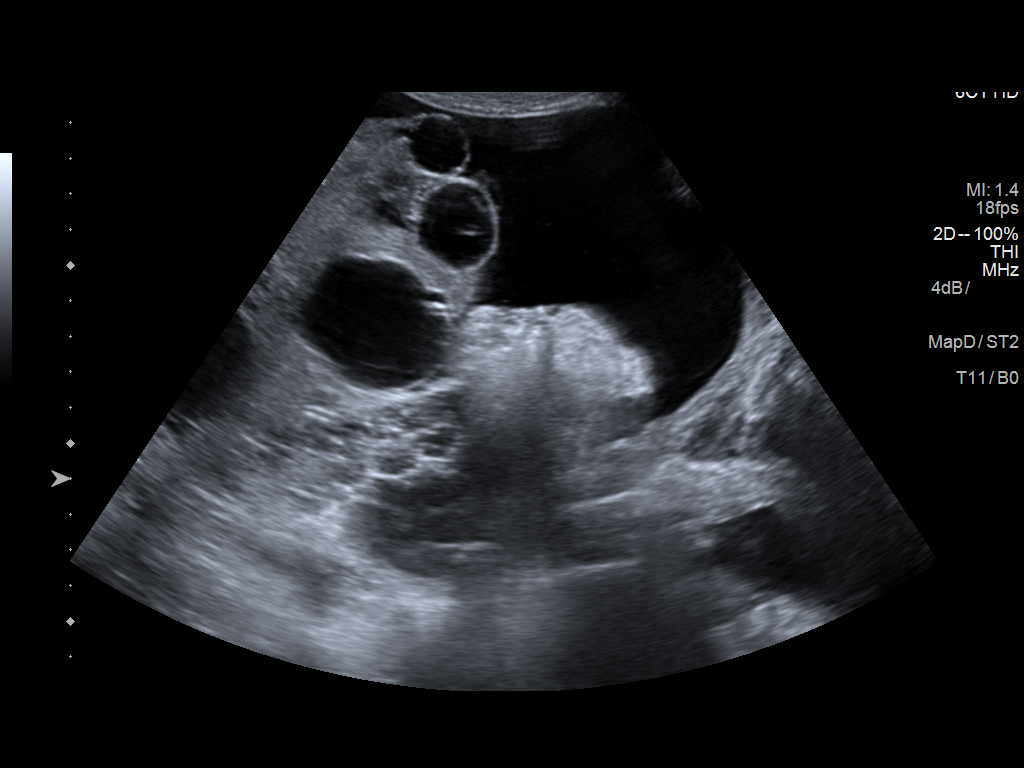
[im 2/4]
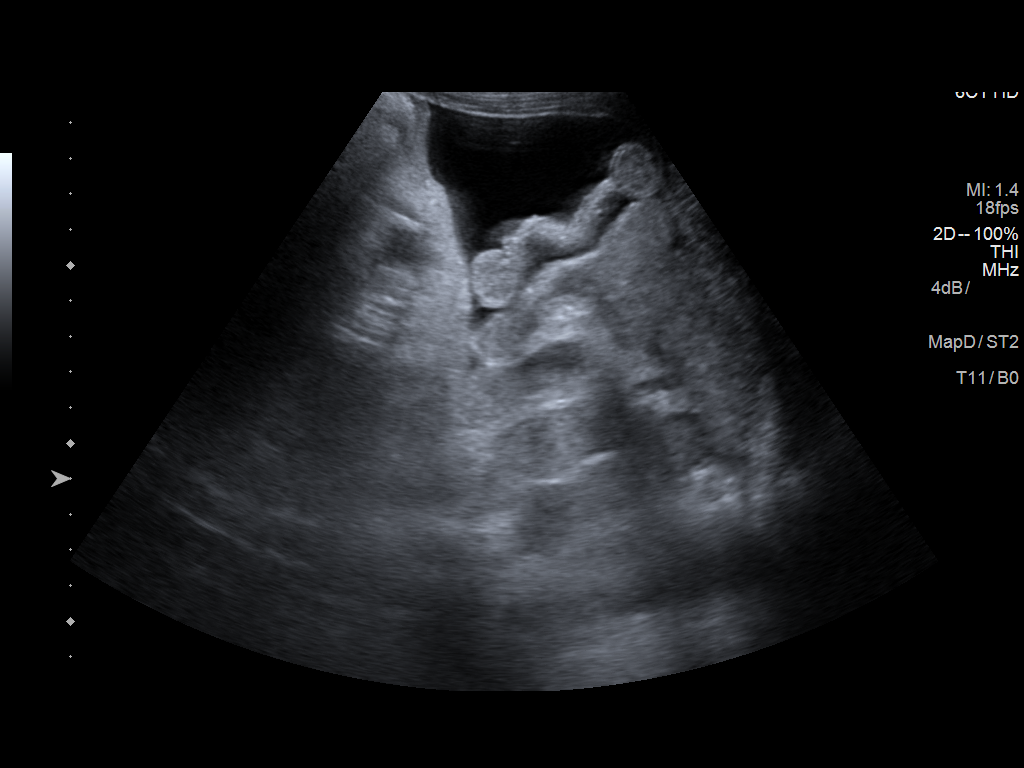
[im 3/4]
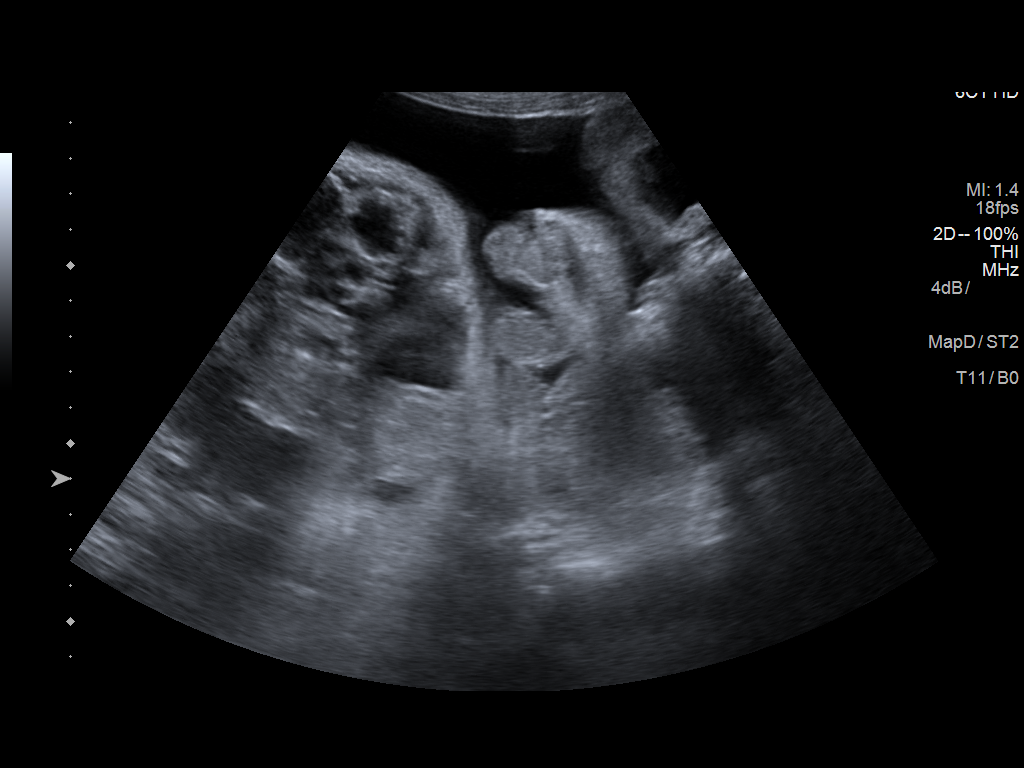
[im 4/4]
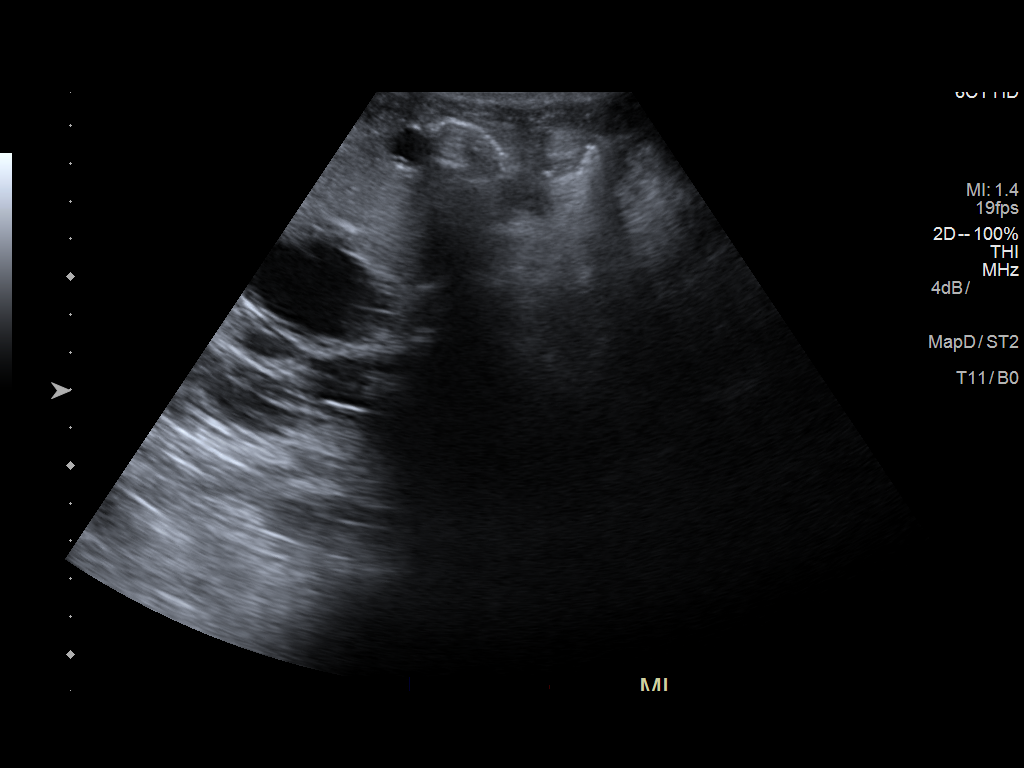

[4 of 4 positions shown; findings below may reference images not displayed]

EXAM:
ULTRASOUND GUIDED  PARACENTESIS

MEDICATIONS:
None.

COMPLICATIONS:
None immediate.

PROCEDURE:
Informed written consent was obtained from the patient after a
discussion of the risks, benefits and alternatives to treatment. A
timeout was performed prior to the initiation of the procedure.

Initial ultrasound scanning demonstrates a moderate amount of
ascites within the right lower abdominal quadrant. The right lower
abdomen was prepped and draped in the usual sterile fashion. 1%
lidocaine with epinephrine was used for local anesthesia.

Following this, a Safe-T-Centesis catheter was introduced. An
ultrasound image was saved for documentation purposes. The
paracentesis was performed. The catheter was removed and a dressing
was applied. The patient tolerated the procedure well without
immediate post procedural complication.
FINDINGS: A total of approximately 4.4 L of clear yellow fluid was removed.
IMPRESSION: Successful ultrasound-guided paracentesis yielding 4.4 liters of
peritoneal fluid.

## 2021-05-04 ENCOUNTER — Other Ambulatory Visit: Payer: Self-pay | Admitting: Nurse Practitioner
